# Patient Record
Sex: Female | Born: 1950
Health system: Southern US, Community
[De-identification: ages and names within clinical notes are randomized; demographics above are authoritative.]

## PROBLEM LIST (undated history)

## (undated) ENCOUNTER — Emergency Department (HOSPITAL_BASED_OUTPATIENT_CLINIC_OR_DEPARTMENT_OTHER): Discharge status: Against Medical Advice | Payer: Medicare Other

## (undated) DIAGNOSIS — N189 Chronic kidney disease, unspecified: Secondary | ICD-10-CM

## (undated) DIAGNOSIS — F32A Depression, unspecified: Secondary | ICD-10-CM

## (undated) DIAGNOSIS — F418 Other specified anxiety disorders: Secondary | ICD-10-CM

## (undated) DIAGNOSIS — F410 Panic disorder [episodic paroxysmal anxiety] without agoraphobia: Secondary | ICD-10-CM

## (undated) DIAGNOSIS — I509 Heart failure, unspecified: Secondary | ICD-10-CM

## (undated) DIAGNOSIS — F329 Major depressive disorder, single episode, unspecified: Secondary | ICD-10-CM

## (undated) DIAGNOSIS — L304 Erythema intertrigo: Secondary | ICD-10-CM

## (undated) DIAGNOSIS — I10 Essential (primary) hypertension: Secondary | ICD-10-CM

## (undated) DIAGNOSIS — E119 Type 2 diabetes mellitus without complications: Secondary | ICD-10-CM

## (undated) DIAGNOSIS — G43909 Migraine, unspecified, not intractable, without status migrainosus: Secondary | ICD-10-CM

## (undated) DIAGNOSIS — C801 Malignant (primary) neoplasm, unspecified: Secondary | ICD-10-CM

## (undated) HISTORY — DX: Depression, unspecified: F32.A

## (undated) HISTORY — DX: Major depressive disorder, single episode, unspecified: F32.9

## (undated) HISTORY — PX: DILATION AND CURETTAGE OF UTERUS: SHX78

## (undated) HISTORY — DX: Malignant (primary) neoplasm, unspecified: C80.1

## (undated) HISTORY — PX: OTHER SURGICAL HISTORY: SHX169

## (undated) HISTORY — PX: ABDOMINAL HYSTERECTOMY: SHX81

## (undated) HISTORY — PX: TONSILLECTOMY AND ADENOIDECTOMY: SUR1326

## (undated) HISTORY — DX: Erythema intertrigo: L30.4

## (undated) HISTORY — PX: TUBAL LIGATION: SHX77

## (undated) HISTORY — PX: BREAST SURGERY: SHX581

## (undated) HISTORY — PX: NASAL SEPTUM SURGERY: SHX37

## (undated) HISTORY — PX: TOTAL ABDOMINAL HYSTERECTOMY W/ BILATERAL SALPINGOOPHORECTOMY: SHX83

## (undated) HISTORY — DX: Chronic kidney disease, unspecified: N18.9

---

## 1998-11-07 ENCOUNTER — Encounter: Payer: Self-pay | Admitting: Emergency Medicine

## 1998-11-07 ENCOUNTER — Emergency Department (HOSPITAL_COMMUNITY): Admission: EM | Admit: 1998-11-07 | Discharge: 1998-11-07 | Payer: Self-pay | Admitting: Emergency Medicine

## 1998-11-12 ENCOUNTER — Emergency Department (HOSPITAL_COMMUNITY): Admission: EM | Admit: 1998-11-12 | Discharge: 1998-11-12 | Payer: Self-pay | Admitting: Internal Medicine

## 2001-04-19 ENCOUNTER — Other Ambulatory Visit: Admission: RE | Admit: 2001-04-19 | Discharge: 2001-04-19 | Payer: Self-pay | Admitting: Obstetrics and Gynecology

## 2001-05-04 ENCOUNTER — Ambulatory Visit (HOSPITAL_COMMUNITY): Admission: RE | Admit: 2001-05-04 | Discharge: 2001-05-04 | Payer: Self-pay | Admitting: Obstetrics and Gynecology

## 2001-05-04 ENCOUNTER — Encounter: Payer: Self-pay | Admitting: Obstetrics and Gynecology

## 2002-01-28 ENCOUNTER — Emergency Department (HOSPITAL_COMMUNITY): Admission: EM | Admit: 2002-01-28 | Discharge: 2002-01-28 | Payer: Self-pay | Admitting: Emergency Medicine

## 2002-01-28 ENCOUNTER — Encounter: Payer: Self-pay | Admitting: Emergency Medicine

## 2003-10-24 ENCOUNTER — Other Ambulatory Visit: Admission: RE | Admit: 2003-10-24 | Discharge: 2003-10-24 | Payer: Self-pay | Admitting: Obstetrics and Gynecology

## 2004-12-17 ENCOUNTER — Ambulatory Visit: Payer: Self-pay | Admitting: Internal Medicine

## 2005-02-27 ENCOUNTER — Ambulatory Visit: Payer: Self-pay | Admitting: Internal Medicine

## 2005-03-06 ENCOUNTER — Ambulatory Visit: Payer: Self-pay | Admitting: Family Medicine

## 2005-06-29 ENCOUNTER — Emergency Department (HOSPITAL_COMMUNITY): Admission: EM | Admit: 2005-06-29 | Discharge: 2005-06-29 | Payer: Self-pay | Admitting: Emergency Medicine

## 2005-07-22 ENCOUNTER — Encounter (INDEPENDENT_AMBULATORY_CARE_PROVIDER_SITE_OTHER): Payer: Self-pay | Admitting: *Deleted

## 2005-07-22 ENCOUNTER — Observation Stay (HOSPITAL_COMMUNITY): Admission: RE | Admit: 2005-07-22 | Discharge: 2005-07-23 | Payer: Self-pay | Admitting: Obstetrics and Gynecology

## 2005-10-22 ENCOUNTER — Ambulatory Visit: Payer: Self-pay | Admitting: Internal Medicine

## 2006-03-03 ENCOUNTER — Ambulatory Visit: Payer: Self-pay | Admitting: Internal Medicine

## 2006-04-12 ENCOUNTER — Ambulatory Visit: Payer: Self-pay | Admitting: Internal Medicine

## 2006-05-11 ENCOUNTER — Ambulatory Visit: Payer: Self-pay | Admitting: Internal Medicine

## 2006-05-14 ENCOUNTER — Encounter: Admission: RE | Admit: 2006-05-14 | Discharge: 2006-05-14 | Payer: Self-pay | Admitting: Internal Medicine

## 2006-08-17 ENCOUNTER — Ambulatory Visit: Payer: Self-pay | Admitting: Internal Medicine

## 2006-10-14 ENCOUNTER — Ambulatory Visit: Payer: Self-pay | Admitting: Internal Medicine

## 2006-12-30 ENCOUNTER — Ambulatory Visit: Payer: Self-pay | Admitting: Internal Medicine

## 2006-12-30 LAB — CONVERTED CEMR LAB
ALT: 22 units/L (ref 0–40)
AST: 23 units/L (ref 0–37)
Albumin: 3.6 g/dL (ref 3.5–5.2)
Alkaline Phosphatase: 80 units/L (ref 39–117)
BUN: 6 mg/dL (ref 6–23)
Basophils Absolute: 0 10*3/uL (ref 0.0–0.1)
Basophils Relative: 0.5 % (ref 0.0–1.0)
CO2: 28 meq/L (ref 19–32)
Calcium: 9 mg/dL (ref 8.4–10.5)
Chloride: 104 meq/L (ref 96–112)
Cholesterol: 166 mg/dL (ref 0–200)
Creatinine, Ser: 1 mg/dL (ref 0.4–1.2)
Direct LDL: 97.9 mg/dL
Eosinophils Absolute: 0.1 10*3/uL (ref 0.0–0.6)
Eosinophils Relative: 1.2 % (ref 0.0–5.0)
Free T4: 0.6 ng/dL (ref 0.6–1.6)
GFR calc Af Amer: 75 mL/min
GFR calc non Af Amer: 62 mL/min
Glucose, Bld: 138 mg/dL — ABNORMAL HIGH (ref 70–99)
HCT: 39.3 % (ref 36.0–46.0)
HDL: 36 mg/dL — ABNORMAL LOW (ref >39.0)
Hemoglobin: 13.6 g/dL (ref 12.0–15.0)
Hgb A1c MFr Bld: 5.9 % (ref 4.6–6.0)
Lymphocytes Relative: 17.8 % (ref 12.0–46.0)
MCHC: 34.4 g/dL (ref 30.0–36.0)
MCV: 89.5 fL (ref 78.0–100.0)
Monocytes Absolute: 0.5 10*3/uL (ref 0.2–0.7)
Monocytes Relative: 7.4 % (ref 3.0–11.0)
Neutro Abs: 5 10*3/uL (ref 1.4–7.7)
Neutrophils Relative %: 73.1 % (ref 43.0–77.0)
Platelets: 204 10*3/uL (ref 150–400)
Potassium: 3.5 meq/L (ref 3.5–5.1)
RBC: 4.4 M/uL (ref 3.87–5.11)
RDW: 13.2 % (ref 11.5–14.6)
Sodium: 138 meq/L (ref 135–145)
TSH: 2.82 microintl units/mL (ref 0.35–5.50)
Total Bilirubin: 0.5 mg/dL (ref 0.3–1.2)
Total CHOL/HDL Ratio: 4.6
Total Protein: 6.7 g/dL (ref 6.0–8.3)
Triglycerides: 228 mg/dL (ref 0–149)
VLDL: 46 mg/dL — ABNORMAL HIGH (ref 0–40)
WBC: 6.8 10*3/uL (ref 4.5–10.5)

## 2007-02-21 DIAGNOSIS — G43909 Migraine, unspecified, not intractable, without status migrainosus: Secondary | ICD-10-CM | POA: Insufficient documentation

## 2007-02-21 DIAGNOSIS — N200 Calculus of kidney: Secondary | ICD-10-CM | POA: Insufficient documentation

## 2007-02-21 HISTORY — DX: Calculus of kidney: N20.0

## 2007-05-31 ENCOUNTER — Ambulatory Visit: Payer: Self-pay | Admitting: Internal Medicine

## 2007-05-31 DIAGNOSIS — F329 Major depressive disorder, single episode, unspecified: Secondary | ICD-10-CM

## 2007-05-31 DIAGNOSIS — F3289 Other specified depressive episodes: Secondary | ICD-10-CM | POA: Insufficient documentation

## 2007-05-31 HISTORY — DX: Other specified depressive episodes: F32.89

## 2007-05-31 HISTORY — DX: Major depressive disorder, single episode, unspecified: F32.9

## 2007-06-15 ENCOUNTER — Ambulatory Visit: Payer: Self-pay | Admitting: *Deleted

## 2007-06-22 ENCOUNTER — Encounter: Payer: Self-pay | Admitting: Internal Medicine

## 2007-06-22 ENCOUNTER — Ambulatory Visit: Payer: Self-pay | Admitting: *Deleted

## 2007-06-29 ENCOUNTER — Ambulatory Visit: Payer: Self-pay | Admitting: *Deleted

## 2007-11-02 ENCOUNTER — Ambulatory Visit: Payer: Self-pay | Admitting: Internal Medicine

## 2007-11-02 DIAGNOSIS — Z9189 Other specified personal risk factors, not elsewhere classified: Secondary | ICD-10-CM | POA: Insufficient documentation

## 2007-11-02 DIAGNOSIS — R631 Polydipsia: Secondary | ICD-10-CM

## 2007-11-02 DIAGNOSIS — R635 Abnormal weight gain: Secondary | ICD-10-CM | POA: Insufficient documentation

## 2007-11-02 HISTORY — DX: Abnormal weight gain: R63.5

## 2007-11-02 HISTORY — DX: Polydipsia: R63.1

## 2007-11-14 ENCOUNTER — Encounter (INDEPENDENT_AMBULATORY_CARE_PROVIDER_SITE_OTHER): Payer: Self-pay | Admitting: *Deleted

## 2007-12-15 ENCOUNTER — Telehealth: Payer: Self-pay | Admitting: Internal Medicine

## 2008-01-18 ENCOUNTER — Telehealth (INDEPENDENT_AMBULATORY_CARE_PROVIDER_SITE_OTHER): Payer: Self-pay | Admitting: *Deleted

## 2008-02-03 ENCOUNTER — Ambulatory Visit: Payer: Self-pay | Admitting: Internal Medicine

## 2008-02-05 LAB — CONVERTED CEMR LAB
ALT: 21 units/L (ref 0–35)
AST: 20 units/L (ref 0–37)
Albumin: 3.5 g/dL (ref 3.5–5.2)
Alkaline Phosphatase: 74 units/L (ref 39–117)
Bilirubin, Direct: 0.1 mg/dL (ref 0.0–0.3)
Cholesterol: 170 mg/dL (ref 0–200)
Direct LDL: 95.5 mg/dL
HDL: 34.6 mg/dL — ABNORMAL LOW (ref >39.0)
Hgb A1c MFr Bld: 6 % (ref 4.6–6.0)
Total Bilirubin: 0.8 mg/dL (ref 0.3–1.2)
Total CHOL/HDL Ratio: 4.9
Total Protein: 7.1 g/dL (ref 6.0–8.3)
Triglycerides: 301 mg/dL (ref 0–149)
VLDL: 60 mg/dL — ABNORMAL HIGH (ref 0–40)

## 2008-02-07 ENCOUNTER — Encounter (INDEPENDENT_AMBULATORY_CARE_PROVIDER_SITE_OTHER): Payer: Self-pay | Admitting: *Deleted

## 2008-02-27 ENCOUNTER — Ambulatory Visit: Payer: Self-pay | Admitting: Internal Medicine

## 2008-02-27 DIAGNOSIS — E8881 Metabolic syndrome: Secondary | ICD-10-CM

## 2008-02-27 DIAGNOSIS — E785 Hyperlipidemia, unspecified: Secondary | ICD-10-CM | POA: Insufficient documentation

## 2008-02-27 HISTORY — DX: Hyperlipidemia, unspecified: E78.5

## 2008-02-27 HISTORY — DX: Metabolic syndrome: E88.81

## 2008-02-27 HISTORY — DX: Metabolic syndrome: E88.810

## 2008-02-27 LAB — CONVERTED CEMR LAB
Cholesterol, target level: 200 mg/dL
HDL goal, serum: 40 mg/dL
LDL Goal: 160 mg/dL

## 2008-03-14 ENCOUNTER — Ambulatory Visit: Payer: Self-pay | Admitting: Internal Medicine

## 2008-06-29 ENCOUNTER — Ambulatory Visit: Payer: Self-pay | Admitting: Internal Medicine

## 2008-08-09 ENCOUNTER — Ambulatory Visit: Payer: Self-pay | Admitting: Internal Medicine

## 2008-08-09 ENCOUNTER — Inpatient Hospital Stay (HOSPITAL_COMMUNITY): Admission: AD | Admit: 2008-08-09 | Discharge: 2008-08-11 | Payer: Self-pay | Admitting: Internal Medicine

## 2008-08-09 DIAGNOSIS — I10 Essential (primary) hypertension: Secondary | ICD-10-CM

## 2008-08-09 DIAGNOSIS — R109 Unspecified abdominal pain: Secondary | ICD-10-CM | POA: Insufficient documentation

## 2008-08-09 DIAGNOSIS — R6883 Chills (without fever): Secondary | ICD-10-CM | POA: Insufficient documentation

## 2008-08-09 HISTORY — DX: Essential (primary) hypertension: I10

## 2008-08-11 ENCOUNTER — Encounter: Payer: Self-pay | Admitting: Internal Medicine

## 2008-08-15 ENCOUNTER — Encounter: Admission: RE | Admit: 2008-08-15 | Discharge: 2008-08-15 | Payer: Self-pay | Admitting: Internal Medicine

## 2008-08-15 ENCOUNTER — Ambulatory Visit: Payer: Self-pay | Admitting: Internal Medicine

## 2008-08-15 ENCOUNTER — Telehealth: Payer: Self-pay | Admitting: Internal Medicine

## 2008-08-15 DIAGNOSIS — R000 Tachycardia, unspecified: Secondary | ICD-10-CM | POA: Insufficient documentation

## 2008-08-15 DIAGNOSIS — R5383 Other fatigue: Secondary | ICD-10-CM

## 2008-08-15 DIAGNOSIS — R5381 Other malaise: Secondary | ICD-10-CM | POA: Insufficient documentation

## 2008-08-15 DIAGNOSIS — G43809 Other migraine, not intractable, without status migrainosus: Secondary | ICD-10-CM

## 2008-08-15 DIAGNOSIS — R9431 Abnormal electrocardiogram [ECG] [EKG]: Secondary | ICD-10-CM

## 2008-08-15 HISTORY — DX: Other migraine, not intractable, without status migrainosus: G43.809

## 2008-08-15 HISTORY — DX: Abnormal electrocardiogram (ECG) (EKG): R94.31

## 2008-08-16 ENCOUNTER — Telehealth (INDEPENDENT_AMBULATORY_CARE_PROVIDER_SITE_OTHER): Payer: Self-pay | Admitting: *Deleted

## 2008-08-16 ENCOUNTER — Encounter: Payer: Self-pay | Admitting: Internal Medicine

## 2008-08-17 ENCOUNTER — Telehealth (INDEPENDENT_AMBULATORY_CARE_PROVIDER_SITE_OTHER): Payer: Self-pay | Admitting: *Deleted

## 2008-08-17 ENCOUNTER — Encounter (INDEPENDENT_AMBULATORY_CARE_PROVIDER_SITE_OTHER): Payer: Self-pay | Admitting: *Deleted

## 2008-08-20 ENCOUNTER — Encounter (INDEPENDENT_AMBULATORY_CARE_PROVIDER_SITE_OTHER): Payer: Self-pay | Admitting: *Deleted

## 2008-08-21 ENCOUNTER — Ambulatory Visit: Payer: Self-pay | Admitting: Cardiology

## 2008-09-04 ENCOUNTER — Ambulatory Visit: Payer: Self-pay | Admitting: Cardiology

## 2008-09-04 ENCOUNTER — Encounter: Payer: Self-pay | Admitting: Internal Medicine

## 2008-09-24 ENCOUNTER — Encounter: Payer: Self-pay | Admitting: Internal Medicine

## 2009-01-24 ENCOUNTER — Telehealth (INDEPENDENT_AMBULATORY_CARE_PROVIDER_SITE_OTHER): Payer: Self-pay | Admitting: *Deleted

## 2009-03-29 ENCOUNTER — Encounter: Payer: Self-pay | Admitting: Internal Medicine

## 2009-04-17 LAB — CONVERTED CEMR LAB: Pap Smear: NORMAL

## 2009-04-25 ENCOUNTER — Encounter: Payer: Self-pay | Admitting: Internal Medicine

## 2009-05-16 ENCOUNTER — Encounter: Payer: Self-pay | Admitting: Internal Medicine

## 2009-05-21 LAB — HM MAMMOGRAPHY: HM Mammogram: NORMAL

## 2009-05-30 ENCOUNTER — Ambulatory Visit: Payer: Self-pay | Admitting: Internal Medicine

## 2009-05-30 DIAGNOSIS — H6690 Otitis media, unspecified, unspecified ear: Secondary | ICD-10-CM | POA: Insufficient documentation

## 2009-08-16 ENCOUNTER — Encounter: Payer: Self-pay | Admitting: Internal Medicine

## 2009-08-21 ENCOUNTER — Ambulatory Visit (HOSPITAL_COMMUNITY): Admission: RE | Admit: 2009-08-21 | Discharge: 2009-08-21 | Payer: Self-pay | Admitting: Obstetrics and Gynecology

## 2009-11-08 ENCOUNTER — Encounter: Payer: Self-pay | Admitting: Internal Medicine

## 2009-11-08 ENCOUNTER — Ambulatory Visit: Payer: Self-pay | Admitting: Family

## 2009-11-08 DIAGNOSIS — L538 Other specified erythematous conditions: Secondary | ICD-10-CM

## 2009-11-08 HISTORY — DX: Other specified erythematous conditions: L53.8

## 2009-11-08 LAB — CONVERTED CEMR LAB
ALT: 16 units/L (ref 0–35)
AST: 17 units/L (ref 0–37)
BUN: 7 mg/dL (ref 6–23)
Basophils Absolute: 0.1 10*3/uL (ref 0.0–0.1)
Basophils Relative: 1.3 % (ref 0.0–3.0)
CO2: 30 meq/L (ref 19–32)
Calcium: 8.6 mg/dL (ref 8.4–10.5)
Chloride: 103 meq/L (ref 96–112)
Creatinine, Ser: 0.9 mg/dL (ref 0.4–1.2)
Eosinophils Absolute: 0.2 10*3/uL (ref 0.0–0.7)
Eosinophils Relative: 1.9 % (ref 0.0–5.0)
GFR calc non Af Amer: 68.81 mL/min (ref >60)
Glucose, Bld: 99 mg/dL (ref 70–99)
HCT: 39.2 % (ref 36.0–46.0)
Hemoglobin: 12.9 g/dL (ref 12.0–15.0)
Lymphocytes Relative: 18.8 % (ref 12.0–46.0)
Lymphs Abs: 1.5 10*3/uL (ref 0.7–4.0)
MCHC: 32.8 g/dL (ref 30.0–36.0)
MCV: 92.5 fL (ref 78.0–100.0)
Monocytes Absolute: 0.5 10*3/uL (ref 0.1–1.0)
Monocytes Relative: 6.8 % (ref 3.0–12.0)
Neutro Abs: 5.6 10*3/uL (ref 1.4–7.7)
Neutrophils Relative %: 71.2 % (ref 43.0–77.0)
Platelets: 194 10*3/uL (ref 150.0–400.0)
Potassium: 4.1 meq/L (ref 3.5–5.1)
RBC: 4.24 M/uL (ref 3.87–5.11)
RDW: 13.1 % (ref 11.5–14.6)
Sodium: 139 meq/L (ref 135–145)
TSH: 2.71 microintl units/mL (ref 0.35–5.50)
WBC: 7.9 10*3/uL (ref 4.5–10.5)

## 2009-11-11 ENCOUNTER — Encounter (INDEPENDENT_AMBULATORY_CARE_PROVIDER_SITE_OTHER): Payer: Self-pay | Admitting: *Deleted

## 2009-11-11 ENCOUNTER — Telehealth (INDEPENDENT_AMBULATORY_CARE_PROVIDER_SITE_OTHER): Payer: Self-pay | Admitting: *Deleted

## 2009-11-22 ENCOUNTER — Encounter (INDEPENDENT_AMBULATORY_CARE_PROVIDER_SITE_OTHER): Payer: Self-pay | Admitting: *Deleted

## 2009-11-25 ENCOUNTER — Ambulatory Visit: Payer: Self-pay | Admitting: Gastroenterology

## 2009-12-12 ENCOUNTER — Ambulatory Visit: Payer: Self-pay | Admitting: Gastroenterology

## 2009-12-12 LAB — HM COLONOSCOPY: HM Colonoscopy: NORMAL

## 2009-12-13 ENCOUNTER — Encounter: Payer: Self-pay | Admitting: Gastroenterology

## 2010-04-10 ENCOUNTER — Telehealth: Payer: Self-pay | Admitting: Internal Medicine

## 2010-05-29 ENCOUNTER — Encounter: Payer: Self-pay | Admitting: Internal Medicine

## 2010-06-05 ENCOUNTER — Encounter: Admission: RE | Admit: 2010-06-05 | Discharge: 2010-06-05 | Payer: Self-pay | Admitting: Obstetrics and Gynecology

## 2010-07-01 ENCOUNTER — Encounter: Payer: Self-pay | Admitting: Internal Medicine

## 2010-07-01 ENCOUNTER — Telehealth (INDEPENDENT_AMBULATORY_CARE_PROVIDER_SITE_OTHER): Payer: Self-pay | Admitting: *Deleted

## 2010-07-08 ENCOUNTER — Encounter: Payer: Self-pay | Admitting: Internal Medicine

## 2010-07-14 ENCOUNTER — Encounter: Payer: Self-pay | Admitting: Internal Medicine

## 2010-07-16 ENCOUNTER — Ambulatory Visit (HOSPITAL_BASED_OUTPATIENT_CLINIC_OR_DEPARTMENT_OTHER): Admission: RE | Admit: 2010-07-16 | Discharge: 2010-07-16 | Payer: Self-pay | Admitting: Surgery

## 2010-07-19 ENCOUNTER — Inpatient Hospital Stay (HOSPITAL_COMMUNITY): Admission: EM | Admit: 2010-07-19 | Discharge: 2010-07-21 | Payer: Self-pay | Admitting: Neurology

## 2010-07-21 ENCOUNTER — Encounter (INDEPENDENT_AMBULATORY_CARE_PROVIDER_SITE_OTHER): Payer: Self-pay | Admitting: Cardiology

## 2010-07-31 ENCOUNTER — Encounter: Payer: Self-pay | Admitting: Internal Medicine

## 2010-12-02 ENCOUNTER — Telehealth (INDEPENDENT_AMBULATORY_CARE_PROVIDER_SITE_OTHER): Payer: Self-pay | Admitting: *Deleted

## 2010-12-22 ENCOUNTER — Other Ambulatory Visit: Payer: Self-pay | Admitting: Family Medicine

## 2010-12-22 ENCOUNTER — Ambulatory Visit
Admission: RE | Admit: 2010-12-22 | Discharge: 2010-12-22 | Payer: Self-pay | Source: Home / Self Care | Attending: Family Medicine | Admitting: Family Medicine

## 2010-12-22 ENCOUNTER — Encounter: Payer: Self-pay | Admitting: Family Medicine

## 2010-12-22 LAB — BASIC METABOLIC PANEL
BUN: 10 mg/dL (ref 6–23)
CO2: 29 mEq/L (ref 19–32)
Calcium: 8.8 mg/dL (ref 8.4–10.5)
Chloride: 97 mEq/L (ref 96–112)
Creatinine, Ser: 0.8 mg/dL (ref 0.4–1.2)
GFR: 75.25 mL/min (ref >60.00)
Glucose, Bld: 96 mg/dL (ref 70–99)
Potassium: 4.1 mEq/L (ref 3.5–5.1)
Sodium: 135 mEq/L (ref 135–145)

## 2010-12-23 ENCOUNTER — Encounter: Payer: Self-pay | Admitting: Internal Medicine

## 2010-12-27 ENCOUNTER — Encounter: Payer: Self-pay | Admitting: Orthopedic Surgery

## 2010-12-28 ENCOUNTER — Encounter: Payer: Self-pay | Admitting: Obstetrics and Gynecology

## 2010-12-29 ENCOUNTER — Telehealth (INDEPENDENT_AMBULATORY_CARE_PROVIDER_SITE_OTHER): Payer: Self-pay | Admitting: *Deleted

## 2010-12-29 ENCOUNTER — Ambulatory Visit
Admission: RE | Admit: 2010-12-29 | Discharge: 2010-12-29 | Payer: Self-pay | Source: Home / Self Care | Attending: Internal Medicine | Admitting: Internal Medicine

## 2011-01-02 ENCOUNTER — Telehealth (INDEPENDENT_AMBULATORY_CARE_PROVIDER_SITE_OTHER): Payer: Self-pay | Admitting: *Deleted

## 2011-01-04 LAB — CONVERTED CEMR LAB
ALT: 22 units/L (ref 0–35)
AST: 21 units/L (ref 0–37)
Albumin: 4.5 g/dL (ref 3.5–5.2)
Alkaline Phosphatase: 89 units/L (ref 39–117)
BUN: 11 mg/dL (ref 6–23)
Basophils Absolute: 0 10*3/uL (ref 0.0–0.1)
Basophils Absolute: 0.1 10*3/uL (ref 0.0–0.1)
Basophils Relative: 1 % (ref 0.0–3.0)
Basophils Relative: 1 % (ref 0–1)
Bilirubin, Direct: 0.1 mg/dL (ref 0.0–0.3)
CK-MB: 1.3 ng/mL (ref 0.3–4.0)
CO2: 22 meq/L (ref 19–32)
Calcium: 8.8 mg/dL (ref 8.4–10.5)
Chloride: 102 meq/L (ref 96–112)
Cholesterol: 150 mg/dL (ref 0–200)
Creatinine, Ser: 0.89 mg/dL (ref 0.40–1.20)
Eosinophils Absolute: 0.1 10*3/uL (ref 0.0–0.7)
Eosinophils Absolute: 0.1 10*3/uL — ABNORMAL LOW (ref 0.2–0.7)
Eosinophils Relative: 1 % (ref 0–5)
Eosinophils Relative: 2 % (ref 0.0–5.0)
Free T4: 0.9 ng/dL (ref 0.6–1.6)
Glucose, Bld: 107 mg/dL — ABNORMAL HIGH (ref 70–99)
HCT: 41.3 % (ref 36.0–46.0)
HCT: 43 % (ref 36.0–46.0)
HDL: 42 mg/dL (ref >39)
Hemoglobin: 13.7 g/dL (ref 12.0–15.0)
Hemoglobin: 14 g/dL (ref 12.0–15.0)
Hgb A1c MFr Bld: 6 % (ref 4.6–6.1)
Indirect Bilirubin: 0.3 mg/dL (ref 0.0–0.9)
LDL Cholesterol: 64 mg/dL (ref 0–99)
Lymphocytes Relative: 19 % (ref 12–46)
Lymphocytes Relative: 24.6 % (ref 12.0–46.0)
Lymphs Abs: 1.6 10*3/uL (ref 0.7–4.0)
MCHC: 31.9 g/dL (ref 30.0–36.0)
MCHC: 33.9 g/dL (ref 30.0–36.0)
MCV: 91.9 fL (ref 78.0–100.0)
MCV: 95.8 fL (ref 78.0–100.0)
Magnesium: 2 mg/dL (ref 1.5–2.5)
Monocytes Absolute: 0.2 10*3/uL (ref 0.1–1.0)
Monocytes Absolute: 0.5 10*3/uL (ref 0.1–1.0)
Monocytes Relative: 3.7 % (ref 3.0–12.0)
Monocytes Relative: 6 % (ref 3–12)
Neutro Abs: 4.2 10*3/uL (ref 1.4–7.7)
Neutro Abs: 6.3 10*3/uL (ref 1.7–7.7)
Neutrophils Relative %: 68.7 % (ref 43.0–77.0)
Neutrophils Relative %: 74 % (ref 43–77)
Platelets: 222 10*3/uL (ref 150–400)
Platelets: 301 10*3/uL (ref 150–400)
Potassium: 3.9 meq/L (ref 3.5–5.3)
Potassium: 4.3 meq/L (ref 3.5–5.1)
RBC: 4.49 M/uL (ref 3.87–5.11)
RBC: 4.5 M/uL (ref 3.87–5.11)
RDW: 12.6 % (ref 11.5–14.6)
RDW: 13.8 % (ref 11.5–15.5)
Sodium: 140 meq/L (ref 135–145)
TSH: 2.73 microintl units/mL (ref 0.35–5.50)
TSH: 3.153 microintl units/mL (ref 0.350–5.50)
Total Bilirubin: 0.4 mg/dL (ref 0.3–1.2)
Total CHOL/HDL Ratio: 3.6
Total Protein: 7.3 g/dL (ref 6.0–8.3)
Triglycerides: 219 mg/dL — ABNORMAL HIGH (ref <150)
Troponin I: 0.05 ng/mL (ref <0.06)
VLDL: 44 mg/dL — ABNORMAL HIGH (ref 0–40)
WBC: 6.1 10*3/uL (ref 4.5–10.5)
WBC: 8.5 10*3/uL (ref 4.0–10.5)

## 2011-01-08 NOTE — Miscellaneous (Signed)
Summary: Orders Update  Clinical Lists Changes 

## 2011-01-08 NOTE — Progress Notes (Signed)
Summary: need lab orders and codes for 2/15--added in Epic  Phone Note Call from Patient   Caller: Patient Summary of Call: patient has labs before CPX per Hopp on 2/15---what are orders and codes please?? Initial call taken by: Jerolyn Shin,  December 29, 2010 11:23 AM  Follow-up for Phone Call        v70.0/Lipid/Hep/BMP/CBCD/TSH/Stool Cards Follow-up by: Shonna Chock CMA,  December 29, 2010 3:23 PM  Additional Follow-up for Phone Call Additional follow up Details #1::        Added to lab visit on 01/21/2011 in Epic  Additional Follow-up by: Jerolyn Shin,  December 30, 2010 11:33 AM

## 2011-01-08 NOTE — Letter (Signed)
   [facility] [address] [place], [place] [address] [phone]    November 08, 2009   Ebony Garcia [address] [place], [place] [address]  RE:  LAB RESULTS  Dear  [name],  The following is an interpretation of your most recent lab tests.  Please take note of any instructions provided or changes to medications that have resulted from your lab work.  ELECTROLYTES:  Good - no changes needed  KIDNEY FUNCTION TESTS:  Good - no changes needed   THYROID STUDIES:  Thyroid studies normal TSH: 2.71     DIABETIC STUDIES:  Excellent - no changes needed Blood Glucose: 99   HgbA1C: 6.0     CBC:  Good - no changes needed   Sincerely Yours,    Lemont Fillers FNP

## 2011-01-08 NOTE — Progress Notes (Signed)
Summary: TIMCO "PROOF OF PHYSICAL" PAPERWORK  Phone Note Call from Patient Call back at Home Phone [phone]   Caller: Patient Summary of Call: PATIENT WAS SEEN 11/08/2009 BY MELISSA O'SULLIVAN AND FORGOT HER [facility] "PROOF OF PHYSICAL " FORM  SHE DROPPED IT OFF TODAY AND REQUESTED THAT, AFTER IT HAS BEEN SIGNED AND DATED, IF IT COULD BE MAILED TO HER  CONFIRMED THAT ADDRESS IN EMR IS CORRECT  WILL TAKE FORM TO CHEMIRA Initial call taken by: Jerolyn Shin,  November 11, 2009 3:20 PM  Follow-up for Phone Call        done and mailed to pt ..........Marland KitchenDoristine Devoid  November 11, 2009 3:50 PM

## 2011-01-08 NOTE — Assessment & Plan Note (Signed)
Summary: cpx//tl   Vital Signs:  Patient Profile:   60 Years Old Female Height:     64 inches Weight:      208 pounds Pulse rate:   60 / minute Pulse rhythm:   regular Resp:     18 per minute BP sitting:   108 / 62  (left arm) Cuff size:   large  Pt. in pain?   no  Vitals Entered By: Wendall Stade (November 02, 2007 10:22 AM)                  Chief Complaint:  cpx.  History of Present Illness: She plans diet & CVE changes post holidays because of weight.  Current Allergies (reviewed today): ! DARVON ! DARVOCET ! * ANTIHISTAMINES VICODIN   Social History:    No diet   Risk Factors:  Tobacco use:  never Alcohol use:  no Exercise:  no   Review of Systems  General      Denies chills, fatigue, fever, loss of appetite, malaise, sleep disorder, sweats, weakness, and weight loss.      Occa sweats; S/P TAH & BSO, on Vivelle from Dr Rosalio Macadamia  Eyes      Denies blurring, discharge, double vision, eye irritation, eye pain, halos, itching, light sensitivity, red eye, vision loss-1 eye, and vision loss-both eyes.      Last exam ?, NAD  ENT      Denies decreased hearing, difficulty swallowing, ear discharge, earache, hoarseness, nasal congestion, nosebleeds, postnasal drainage, ringing in ears, sinus pressure, and sore throat.  CV      Denies bluish discoloration of lips or nails, chest pain or discomfort, difficulty breathing at night, difficulty breathing while lying down, shortness of breath with exertion, and swelling of feet.      Occa racing  Resp      Complains of excessive snoring and hypersomnolence.      Denies cough, morning headaches, shortness of breath, and sputum productive.      No apnea reported  GI      Denies abdominal pain, bloody stools, change in bowel habits, constipation, dark tarry stools, diarrhea, indigestion, nausea, and vomiting.  GU      Complains of decreased libido.      Denies discharge, dysuria, hematuria, nocturia, and  urinary frequency.      Sex drive null & void  MS      Denies joint pain, joint redness, joint swelling, loss of strength, low back pain, mid back pain, muscle aches, muscle , cramps, muscle weakness, stiffness, and thoracic pain.  Derm      Denies changes in color of skin, changes in nail beds, dryness, excessive perspiration, flushing, hair loss, itching, lesion(s), poor wound healing, and rash.  Neuro      Complains of headaches.      Denies difficulty with concentration, disturbances in coordination, memory loss, numbness, sensation of room spinning, and tingling.      Headaches 75% better with Symbyax as to freq & severity; "I got my life back"  Psych      Complains of depression.      Denies anxiety, easily angered, easily tearful, irritability, suicidal thoughts/plans, thoughts of violence, and thoughts /plans of harming others.  Endo      Complains of excessive thirst.      Denies cold intolerance, excessive hunger, excessive urination, heat intolerance, polyuria, and weight change.  Heme      Denies abnormal bruising and bleeding.  Allergy  Denies hives or rash, itching eyes, persistent infections, seasonal allergies, and sneezing.   Physical Exam  General:     Well-developed,well-nourished,in no acute distress; alert,appropriate and cooperative throughout examination;overweight-appearing.   Head:     Normocephalic and atraumatic without obvious abnormalities. No apparent alopecia or balding. Eyes:     No corneal or conjunctival inflammation noted. Perrla. Funduscopic exam benign, without hemorrhages, exudates or papilledema. Ears:     External ear exam shows no significant lesions or deformities.  Otoscopic examination reveals clear canals, tympanic membranes are intact bilaterally without bulging, retraction, inflammation or discharge. Hearing is grossly normal bilaterally. Nose:     External nasal examination shows no deformity or inflammation. Nasal mucosa are  pink and moist without lesions or exudates. Mouth:     Oral mucosa and oropharynx without lesions or exudates.  Teeth in good repair. Neck:     No deformities, masses, or tenderness noted. Lungs:     Normal respiratory effort, chest expands symmetrically. Lungs are clear to auscultation, no crackles or wheezes. Heart:     Normal rate and regular rhythm. S1 and S2 normal without gallop, murmur, click, rub. S4 with slurring Abdomen:     Bowel sounds positive,abdomen soft and non-tender without masses, organomegaly or hernias noted. Protuberant Rectal:     as per GYN  Genitalia:     as per Gyn Msk:     No deformity or scoliosis noted of thoracic or lumbar spine.   Pulses:     R and L carotid,radial,dorsalis pedis and posterior tibial pulses are full and equal bilaterally Extremities:     No clubbing, cyanosis, edema, or deformity noted with normal full range of motion of all joints.   Minimal crepitus knees Neurologic:     strength normal in all extremities, gait normal, and DTRs symmetrical and normal.   Skin:     Intact without suspicious lesions or rashes Cervical Nodes:     No lymphadenopathy noted Axillary Nodes:     No palpable lymphadenopathy Psych:     Oriented X3, memory intact for recent and remote, normally interactive, good eye contact, not anxious appearing, not depressed appearing, and not agitated.      Impression & Recommendations:  Problem # 1:  ROUTINE GENERAL MEDICAL EXAM@HEALTH  CARE FACL (ICD-V70.0)  Orders: EKG w/ Interpretation (93000) TLB-Lipid Panel (80061-LIPID) TLB-BMP (Basic Metabolic Panel-BMET) (80048-METABOL) TLB-CBC Platelet - w/Differential (85025-CBCD) TLB-Hepatic/Liver Function Pnl (80076-HEPATIC) TLB-TSH (Thyroid Stimulating Hormone) (84443-TSH) TLB-A1C / Hgb A1C (Glycohemoglobin) (83036-A1C)   Problem # 2:  MIGRAINE (ICD-346.90)  Her updated medication list for this problem includes:    Atenolol 100 Mg Tabs (Atenolol) .Marland Kitchen... Take once  daily    Zomig 5 Mg Tabs (Zolmitriptan) .Marland Kitchen... Take as needed   Problem # 3:  SNORING, HX OF (ICD-V15.89)  Problem # 4:  POLYDIPSIA (ICD-783.5)  Orders: TLB-A1C / Hgb A1C (Glycohemoglobin) (83036-A1C)   Problem # 5:  WEIGHT GAIN (ICD-783.1)  Orders: TLB-A1C / Hgb A1C (Glycohemoglobin) (83036-A1C)   Problem # 6:  DISORDER, DEPRESSIVE NEC (ICD-311) ? "bipolarity"  Complete Medication List: 1)  Atenolol 100 Mg Tabs (Atenolol) .... Take once daily 2)  Hydrochlorothiazide 12.5 Mg Caps (Hydrochlorothiazide) .... Take one tablet daily 3)  Zomig 5 Mg Tabs (Zolmitriptan) .... Take as needed 4)  Symbyax 6-50 Mg Caps (Olanzapine-fluoxetine hcl) .Marland Kitchen.. 1 once daily 5)  Vivelle-dot 0.075 Mg/24hr Pttw (Estradiol) .... Change patch 2 times q week   Patient Instructions: 1)  Please complete stool cards from Dr Rosalio Macadamia. Please  consider screening colonoscopy & Neurochemist consultation. Review article on Diabesity. Call if cardiopulmonary  symptoms with walking. Ask husband to monitor for apnea.    ]

## 2011-01-08 NOTE — Assessment & Plan Note (Signed)
Summary: high BP (170/114)//lch   Vital Signs:  Patient profile:   60 year old female Menstrual status:  hysterectomy Weight:      206 pounds BMI:     36.62 Pulse rate:   76 / minute BP sitting:   140 / 100  (left arm)  Vitals Entered By: Doristine Devoid CMA (December 22, 2010 11:17 AM) CC: elevated bp    History of Present Illness: 60 yo woman here today for elevated BP.  has hx of HTN on problem list but has not been on meds since 2009 b/c she was normotensive.  pt reports she had HA Friday and went to bed.  reports she has had increased fatigue, intermittant HAs, body aches.  reports sxs started 10/3 after getting botox injxn for chronic migraines at [facility].  decided to check BP last night- it was 165/114 on mother-in-law's cuff.  reports intermittant chest pressure since Oct.  denies SOB.  some edema.  has had some increased stress due to fact that mother-in-law has moved in w/ them.  Current Medications (verified): 1)  Vivelle-Dot 0.075 Mg/24hr  Pttw (Estradiol) .... Change Patch 2 Times Q Week 2)  Zomig 5 Mg Tabs (Zolmitriptan) .... Take As Needed For Migraine 3)  Propranolol Hcl Cr 60 Mg Xr24h-Cap (Propranolol Hcl) .Marland Kitchen.. 1 Q 12 Hours For Migraine Prevention & Rapid Heart Rate **appointment Due 11/2010** 4)  Indocin 1 Mg Solr (Indomethacin Sodium) .... As Needed For Migraines 5)  Xanax Xr 0.5 Mg Xr24h-Tab (Alprazolam) .Marland Kitchen.. 1 Tab By Mouth Prn 6)  Nystatin 100000 Unit/gm Crea (Nystatin) .... Apply Twice Daily To Affected Area 7)  Hydrochlorothiazide 12.5 Mg  Tabs (Hydrochlorothiazide) .... Take 1 Tab  By Mouth Every Morning 8)  Pristiq 100 Mg Xr24h-Tab (Desvenlafaxine Succinate) .... Take One Tablet Two Times A Day  Allergies (verified): 1)  ! Darvon 2)  ! Darvocet 3)  ! * Antihistamines 4)  ! Augmentin 5)  Vicodin  Past History:  Past medical, surgical, family and social histories (including risk factors) reviewed, and no changes noted (except as noted below).  Past Medical  History: Reviewed history from 08/09/2008 and no changes required. RENAL CALCULI (4/06) WEIGHT GAIN (ICD-783.1) SNORING, HX OF (ICD-V15.89) DISORDER, DEPRESSIVE NEC (ICD-311) MIGRAINE (ICD-346.90) RENAL CALCULUS (ICD-592.0) Hypertension Hyperlipidemia(Triglycerides 301)  Past Surgical History: Reviewed history from 08/09/2008 and no changes required. Hysterectomy & BSO for fibroids & migraines (not MD's opinion as to migraines) TONSILS/ADENOIDS (T/A) DEVIATED SEPTUM TUBAL LIGATION Giii    P iii D&C X1  Family History: Reviewed history from 11/08/2009 and no changes required. Father: bladder CA, shunt for hydrocephalus, prostate cancer Mother: CROHN'S, DIVERTICULOSIS, CHF AF Siblings: bro & sister ? bipolar  PGF-CA ?TYPE MG AUNT-CA ?TYPE MGM-MI UNCLE-LITHIUM for Bipolar Disorder 2ND COUSIN HODGKINS DISEASE AUNT- NERVOUS BREAKDOWN MATERNAL SIDE HAS MIGRAINES AT LEAST 10 PEOPLE  Social History: Reviewed history from 11/08/2009 and no changes required. 3 sons on disability due to chronic migraines. denies ETOH, non-smoker  Review of Systems      See HPI  Physical Exam  General:  Well-developed, obese female,in no acute distress; alert,appropriate and cooperative throughout examination Neck:  No deformities, masses, or tenderness noted. Lungs:  Normal respiratory effort, chest expands symmetrically. Lungs are clear to auscultation, no crackles or wheezes. Heart:  Normal rate and regular rhythm. S1 and S2 normal without gallop, murmur, click, rub or other extra sounds. Pulses:  +2 carotid, radial, DP Extremities:  no C/C/E   Impression & Recommendations:  Problem #  1:  HYPERTENSION (ICD-401.9) Assessment Deteriorated pt's BP mildly elevated here in office, apparently higher at home.  has been on HCTZ previously w/out difficulty.  restart.  EKG shows some nonspecific Twave changes in V2 and aVL.  discussed sending pt for stress test- she declined.  'that was the  worst experience of my life.  and i've had 3 babies and chronic migraines.  i thought i was going to die!'.  check BMP as baseline given addition of HCTZ and have pt f/u w/ PCP shortly.  reviewed red flags that should prompt immediate return.  Pt expresses understanding and is in agreement w/ this plan. Her updated medication list for this problem includes:    Propranolol Hcl Cr 60 Mg Xr24h-cap (Propranolol hcl) .Marland Kitchen... 1 q 12 hours for migraine prevention & rapid heart rate **appointment due 11/2010**    Hydrochlorothiazide 12.5 Mg Tabs (Hydrochlorothiazide) .Marland Kitchen... Take 1 tab  by mouth every morning  Orders: Venipuncture (91478) TLB-BMP (Basic Metabolic Panel-BMET) (80048-METABOL) EKG w/ Interpretation (93000)  Complete Medication List: 1)  Vivelle-dot 0.075 Mg/24hr Pttw (Estradiol) .... Change patch 2 times q week 2)  Zomig 5 Mg Tabs (Zolmitriptan) .... Take as needed for migraine 3)  Propranolol Hcl Cr 60 Mg Xr24h-cap (Propranolol hcl) .Marland Kitchen.. 1 q 12 hours for migraine prevention & rapid heart rate **appointment due 11/2010** 4)  Indocin 1 Mg Solr (Indomethacin sodium) .... As needed for migraines 5)  Xanax Xr 0.5 Mg Xr24h-tab (Alprazolam) .Marland Kitchen.. 1 tab by mouth prn 6)  Nystatin 100000 Unit/gm Crea (Nystatin) .... Apply twice daily to affected area 7)  Hydrochlorothiazide 12.5 Mg Tabs (Hydrochlorothiazide) .... Take 1 tab  by mouth every morning 8)  Pristiq 100 Mg Xr24h-tab (Desvenlafaxine succinate) .... Take one tablet two times a day  Other Orders: Specimen Handling (29562)  Patient Instructions: 1)  Please schedule an appt with Dr Alwyn Ren in 1 week to recheck blood pressure 2)  Start the Hydrochlorothiazide (HCTZ) daily for your blood pressure 3)  If you develop worsening chest pain, shortness of breath, or other concerns- please call or go to the ER 4)  We'll call you with your cardiology appt 5)  Call with any questions or concerns 6)  Hang in there!!! Prescriptions: HYDROCHLOROTHIAZIDE  12.5 MG  TABS (HYDROCHLOROTHIAZIDE) Take 1 tab  by mouth every morning  #30 x 3   Entered and Authorized by:   Neena Rhymes MD   Signed by:   Neena Rhymes MD on 12/22/2010   Method used:   [facility]ically to        [facility] [phone]* (retail)       [address]       [place], [place]  [address]       Ph: [phone]       Fax: [phone]   RxID:   [id number]    Orders Added: 1)  Venipuncture [38756] 2)  TLB-BMP (Basic Metabolic Panel-BMET) [80048-METABOL] 3)  EKG w/ Interpretation [93000] 4)  Specimen Handling [99000] 5)  Est. Patient Level III [43329]

## 2011-01-08 NOTE — Assessment & Plan Note (Signed)
Summary: ACUTE-- DEPRESSION / BAD HEADACHES   Vital Signs:  Patient Profile:   60 Years Old Female Height:     64 inches Weight:      210.6 pounds Temp:     97.9 degrees F 0 Pulse rate:   64 / minute Resp:     17 per minute BP sitting:   118 / 72  (left arm) Cuff size:   large  Vitals Entered By: Shonna Chock (June 29, 2008 2:05 PM)                 Chief Complaint:  1.) DEPRESSION WORSE X FEW MONTHS 2.)HEADACHES-ONGOING CONCERN and Headaches.  History of Present Illness:  Headaches      This is a 60 year old woman who presents with Headaches.  Constant ache diffusely w/o pattern & prodrome or aura, but most above OS & in L occiput. ? MSG , nitrates are triggers. Rx: Zomig helps.  The patient reports photophobia and phonophobia, but denies nausea, vomiting, sweats, tearing of eyes, nasal congestion, sinus pain, and sinus pressure.  The headache is described as constant and pressure-like.  High-risk features (red flags) include neck pain/stiffness and age >50 years.  The patient denies the following high-risk features: fever, vision loss or change, focal weakness, altered mental status, rash, trauma, pain worse with exertion, new type of headache, immunosuppression, concomitant infection, and anticoagulation use.  She had more headaches on Symbyax 6/25 but now June 17, 2049 makes me "a walking dead person"  Depression History:      The patient is having a depressed mood most of the day and has a diminished interest in her usual daily activities.  Positive alarm features for depression include hypersomnia, psychomotor retardation, fatigue (loss of energy), and recurrent thoughts of death or suicide.  However, she denies significant weight loss, significant weight gain, insomnia, psychomotor agitation, feelings of worthlessness (guilt), and impaired concentration (indecisiveness).  Positive alarm features for a manic disorder include distractibility.  She denies persistently & abnormally elevated  mood, abnormally & persistently irritable mood, less need for sleep, talkative or feels need to keep talking, flight of ideas, increase in goal-directed activity, psychomotor agitation, inflated self-esteem or grandiosity, excessive buying sprees, excessive sexual indiscretions, and excessive foolish business investments.        Risk factors for depression include a family history of depression, a family history of alcoholism, and a personal history of depression.  Suicide risk questions reveal that she does not feel like life is worth living, she wishes that she were dead, she has thought about ending her life, and she has even planned how to end her life.           Current Allergies: ! DARVON ! DARVOCET ! * ANTIHISTAMINES ! AUGMENTIN VICODIN   Family History:    Father: bladder CA, shunt for hydrocephalus    Mother: CROHN'S, DIVERTICULOSIS    Siblings: bro & sister ? bipolar     PGF-CA ?TYPE    MG AUNT-CA ?TYPE    MGM-MI    UNCLE-LITHIUM for Bipolar Disorder    2ND COUSIN HODGKINS DISEASE    AUNT- NERVOUS BREAKDOWN    MATERNAL SIDE HAS MIGRAINES AT LEAST 10 PEOPLE    Review of Systems  General      Complains of sleep disorder.      Denies chills.      Hot flashes  Eyes      Denies blurring, double vision, and vision loss-both eyes.  ENT  Denies decreased hearing, earache, and ringing in ears.  Derm      Denies changes in color of skin and rash.  Neuro      Denies disturbances in coordination, numbness, poor balance, tingling, and tremors.   Physical Exam  General:     well-nourished,in no acute distress; alert,appropriate and cooperative throughout examination; subdued Eyes:     No corneal or conjunctival inflammation noted. EOMI. Perrla. Funduscopic exam benign, without hemorrhages, exudates or papilledema. Vision grossly normal. Psych:     Oriented X3, memory intact for recent and remote, normally interactive, flat affect, and subdued.  MDQ: 0  items +,  "minor problem"    Impression & Recommendations:  Problem # 1:  DEPRESSION, SEVERE (ICD-311)  Orders: Psychiatric Referral (Psych)   Problem # 2:  MIGRAINE (ICD-346.90)  Her updated medication list for this problem includes:    Atenolol 100 Mg Tabs (Atenolol) .Marland Kitchen... Take once daily    Zomig 5 Mg Tabs (Zolmitriptan) .Marland Kitchen... Take as needed   Complete Medication List: 1)  Atenolol 100 Mg Tabs (Atenolol) .... Take once daily 2)  Hydrochlorothiazide 12.5 Mg Caps (Hydrochlorothiazide) .... Take one tablet daily 3)  Zomig 5 Mg Tabs (Zolmitriptan) .... Take as needed 4)  Symbyax 6-25 Mg Caps (olanzapine-fluoxetine Hcl)  .Marland Kitchen.. 1 once daily 5)  Vivelle-dot 0.075 Mg/24hr Pttw (Estradiol) .... Change patch 2 times q week 6)  Hycodan 5-1.5 Mg/22ml Syrp (Hydrocodone-homatropine) .Marland Kitchen.. 1 tsp q 6 hrs prn 7)  Azithromycin 250 Mg Tabs (Azithromycin) .... As per  pack 8)  Abilify 5 Mg Tabs (Aripiprazole) .... 1/2 qd   Patient Instructions: 1)  Please keep Neurochemist consultation   Prescriptions: ABILIFY 5 MG  TABS (ARIPIPRAZOLE) 1/2 qd  #30 x 0   Entered and Authorized by:   Marga Melnick MD   Signed by:   Marga Melnick MD on 06/29/2008   Method used:   Print then Give to Patient   RxID:   [id number] SYMBYAX 6-25 MG  CAPS (OLANZAPINE-FLUOXETINE HCL) 1 once daily  #30 x 0   Entered and Authorized by:   Marga Melnick MD   Signed by:   Marga Melnick MD on 06/29/2008   Method used:   Print then Give to Patient   RxID:   [id number]  ]  Appended Document: ACUTE-- DEPRESSION / BAD HEADACHES Ophth,ENT, neuro, Cardio exams WNL except minor ptosis OS & fine tremor with finger to nose testing

## 2011-01-08 NOTE — Progress Notes (Signed)
Summary: Refill Request  Phone Note Refill Request Call back at [phone] Message from:  Pharmacy on December 02, 2010 8:39 AM  Refills Requested: Medication #1:  PROPRANOLOL HCL CR 60 MG XR24H-CAP 1 q 12 hours for migraine prevention & rapid heart rate **APPOINTMENT DUE 11/2010**   Dosage confirmed as above?Dosage Confirmed   Supply Requested: 1 month   [facility] [address] [place]  Next Appointment Scheduled: none Initial call taken by: Harold Barban,  December 02, 2010 8:39 AM    Prescriptions: PROPRANOLOL HCL CR 60 MG XR24H-CAP (PROPRANOLOL HCL) 1 q 12 hours for migraine prevention & rapid heart rate **APPOINTMENT DUE 11/2010**  #60 x 0   Entered by:   Shonna Chock CMA   Authorized by:   Marga Melnick MD   Signed by:   Shonna Chock CMA on 12/02/2010   Method used:   [facility] to        [facility]  [phone]* (retail)       [address] [place], [place]  [address]       Ph: [phone] or [phone]       Fax: [phone]   RxID:   [id number]

## 2011-01-08 NOTE — Progress Notes (Signed)
----   Converted from flag ---- ---- 01/18/2008 1:31 PM, Okey Regal [place] wrote: [id number]   lab appt scheduled [id number] per append from dr hopper [phone] ------------------------------

## 2011-01-08 NOTE — Letter (Signed)
Summary: Previsit letter  [facility]  [address] [place], [place] [address]   Phone: [phone]  Fax: [phone]       11/11/2009 MRN: [id number]  Ebony Garcia [address] [place], [place]  [address]  Dear [name],  Welcome to the Gastroenterology Division at [facility].    You are scheduled to see a nurse for your pre-procedure visit on 11/25/2009 at 11:00AM on the 3rd floor at [facility], [address].  We ask that you try to arrive at our office 15 minutes prior to your appointment time to allow for check-in.  Your nurse visit will consist of discussing your medical and surgical history, your immediate family medical history, and your medications.    Please bring a complete list of all your medications or, if you prefer, bring the medication bottles and we will list them.  We will need to be aware of both prescribed and over the counter drugs.  We will need to know exact dosage information as well.  If you are on blood thinners (Coumadin, Plavix, Aggrenox, Ticlid, etc.) please call our office today/prior to your appointment, as we need to consult with your physician about holding your medication.   Please be prepared to read and sign documents such as consent forms, a financial agreement, and acknowledgement forms.  If necessary, and with your consent, a friend or relative is welcome to sit-in on the nurse visit with you.  Please bring your insurance card so that we may make a copy of it.  If your insurance requires a referral to see a specialist, please bring your referral form from your primary care physician.  No co-pay is required for this nurse visit.     If you cannot keep your appointment, please call [phone] to cancel or reschedule prior to your appointment date.  This allows [place] the opportunity to schedule an appointment for another patient in need of care.    Thank you for choosing  Gastroenterology for your  medical needs.  We appreciate the opportunity to care for you.  Please visit [place] at our website  to learn more about our practice.                     Sincerely.                                                                                                                   The Gastroenterology Division

## 2011-01-08 NOTE — Letter (Signed)
Summary: [facility]   Imported By: Lanelle Bal 07/28/2010 10:29:21  _____________________________________________________________________  External Attachment:    Type:   Image     Comment:   External Document

## 2011-01-08 NOTE — Progress Notes (Signed)
Summary: MRI RESULTS  Phone Note Outgoing Call Call back at [facility] Phone [phone]   Call placed by: Shonna Chock,  August 17, 2008 10:27 AM Call placed to: Patient Details for Reason: MRI RESULTS Summary of Call: PATIENT OK'D INFORMATION. PATIENT SAID SHE IS ALREADY IN THE PROCESS OF SETTING UP APPOINTMENT WITH DR.DOHIMER./[facility]  August 17, 2008 10:28 AM

## 2011-01-08 NOTE — Assessment & Plan Note (Signed)
Summary: earache/cbs   Vital Signs:  Patient profile:   60 year old female Weight:      213.2 pounds BMI:     36.73 Temp:     98.0 degrees F oral Pulse rate:   76 / minute Resp:     17 per minute BP sitting:   118 / 74  (left arm) Cuff size:   large  Vitals Entered By: Shonna Chock (May 30, 2009 10:44 AM) CC: EARACHE X SEVERAL DAYS-TOOTH/GUM PAIN X COUPLE WEEKS. LEFT EAR IS WORSE Comments REVIEWED MED LIST, PATIENT AGREED DOSE AND INSTRUCTION CORRECT    CC:  EARACHE X SEVERAL DAYS-TOOTH/GUM PAIN X COUPLE WEEKS. LEFT EAR IS WORSE.  History of Present Illness: Onset as teeth/gum pain 2 weeks ago ; dental films negative. Now variable pain intermittently in & below L ear. Rx: ear drops  Allergies: 1)  ! Darvon 2)  ! Darvocet 3)  ! * Antihistamines 4)  ! Augmentin 5)  Vicodin  Review of Systems General:  Complains of sweats; denies chills and fever; Hot flashes. ENT:  See HPI; Complains of ear discharge and nasal congestion; denies sinus pressure; No frontal headaches or purulence; minor facial pain & intermittent ST. Ear D/C clear. Resp:  Denies cough and sputum productive.  Physical Exam  General:  in no acute distress; alert,appropriate and cooperative throughout examination Ears:  Erythema  L TM; R TM WNL. Dried material L canal. Nose:  External nasal examination shows no deformity or inflammation. Nasal mucosa are pink and moist without lesions or exudates. Mouth:  Oral mucosa and oropharynx without lesions or exudates.  Teeth in good repair. Cervical Nodes:  No lymphadenopathy noted Axillary Nodes:  No palpable lymphadenopathy   Impression & Recommendations:  Problem # 1:  UNSPECIFIED OTITIS MEDIA (ICD-382.9)  Her updated medication list for this problem includes:    Advil 200 Mg Tabs (Ibuprofen) .Marland Kitchen... As needed headaches    Indocin 1 Mg Solr (Indomethacin sodium) .Marland Kitchen... As needed for migraines    Smz-tmp Ds 800-160 Mg Tabs (Sulfamethoxazole-trimethoprim) .Marland Kitchen... 1  two times a day with 8 oz water  Complete Medication List: 1)  Vivelle-dot 0.075 Mg/24hr Pttw (Estradiol) .... Change patch 2 times q week 2)  Abilify 5 Mg Tabs (Aripiprazole) .... 1/2 qd 3)  Zomig 5 Mg Tabs (Zolmitriptan) .... Take as needed for migraine 4)  Gabapentin 100 Mg Caps (Gabapentin) .Marland Kitchen.. 1 -2 q 8 hrs as needed for headache 5)  Propranolol Hcl Cr 60 Mg Xr24h-cap (Propranolol hcl) .Marland Kitchen.. 1 q 12 hours for migraine prevention & rapid heart rate 6)  Advil 200 Mg Tabs (Ibuprofen) .... As needed headaches 7)  Indocin 1 Mg Solr (Indomethacin sodium) .... As needed for migraines 8)  Celexa(? Dose)  .Marland Kitchen.. 1 by mouth at bedtime 9)  Cortisporin-tc 3.02-06-09-0.5 Mg/ml Susp (Neomycin-colist-hc-thonzonium) .... 3 drops qid 10)  Smz-tmp Ds 800-160 Mg Tabs (Sulfamethoxazole-trimethoprim) .Marland Kitchen.. 1 two times a day with 8 oz water  Patient Instructions: 1)  Neti pot once daily as needed for head congestion. 2)  Drink as much fluid as you can tolerate for the next few days. Prescriptions: SMZ-TMP DS 800-160 MG TABS (SULFAMETHOXAZOLE-TRIMETHOPRIM) 1 two times a day with 8 oz water  #20 x 0   Entered and Authorized by:   Marga Melnick MD   Signed by:   Marga Melnick MD on 05/30/2009   Method used:   Print then Give to Patient   RxID:   [id number] CORTISPORIN-TC 3.02-06-09-0.5 MG/ML SUSP (NEOMYCIN-COLIST-HC-THONZONIUM)  3 drops qid  #10cc x 0   Entered and Authorized by:   Marga Melnick MD   Signed by:   Marga Melnick MD on 05/30/2009   Method used:   Print then Give to Patient   RxID:   [id number]

## 2011-01-08 NOTE — Progress Notes (Signed)
Summary: referral  Phone Note Call from Patient Call back at Home Phone [phone]   Caller: Patient Summary of Call: Pt left VM that she would like to be referred to [facility] or her chronic migraine. pls advise.............Marland KitchenFelecia Deloach CMA  Apr 10, 2010 12:15 PM   Follow-up for Phone Call        PT AWARE REFERRAL PUT IN AWAITING APPT INFO.........................Marland KitchenFelecia Deloach CMA  Apr 10, 2010 1:24 PM

## 2011-01-08 NOTE — Assessment & Plan Note (Signed)
Summary: 1 week f/u//lch   Vital Signs:  Patient profile:   60 year old female Menstrual status:  hysterectomy Weight:      203.8 pounds BMI:     36.23 Temp:     98.2 degrees F oral Pulse rate:   64 / minute Resp:     14 per minute BP sitting:   114 / 70  (left arm) Cuff size:   large  Vitals Entered By: Shonna Chock CMA (December 29, 2010 10:40 AM) CC: 1 week follow-up on B/P, patient states she had a good weekend: B/P down and no headaches x 4 days ( rare for patient, usually with headaches daily)   CC:  1 week follow-up on B/P, patient states she had a good weekend: B/P down and no headaches x 4 days ( rare for patient, and usually with headaches daily).  History of Present Illness:    BP was 170/114 0n 0115/2012; HCTZ was called in 01/16. Since med started BP  134/ 99 -147/100.   The patient denies lightheadedness, urinary frequency, headaches ( after 4 days of HCTZ), edema, and fatigue.  Associated symptoms include intermittent  palpitations.  The patient denies the following associated symptoms:exertional  chest pain, chest pressure, dyspnea, and syncope.  The patient reports no exercise.  She does not restrict  salt .  Current Medications (verified): 1)  Vivelle-Dot 0.075 Mg/24hr  Pttw (Estradiol) .... Change Patch 2 Times Q Week 2)  Zomig 5 Mg Tabs (Zolmitriptan) .... Take As Needed For Migraine 3)  Propranolol Hcl Cr 60 Mg Xr24h-Cap (Propranolol Hcl) .Marland Kitchen.. 1 Q 12 Hours For Migraine Prevention & Rapid Heart Rate **appointment Due 11/2010** 4)  Indocin 1 Mg Solr (Indomethacin Sodium) .... As Needed For Migraines 5)  Xanax Xr 0.5 Mg Xr24h-Tab (Alprazolam) .Marland Kitchen.. 1 Tab By Mouth Prn 6)  Nystatin 100000 Unit/gm Crea (Nystatin) .... Apply Twice Daily To Affected Area 7)  Hydrochlorothiazide 12.5 Mg  Tabs (Hydrochlorothiazide) .... Take 1 Tab  By Mouth Every Morning 8)  Pristiq 100 Mg Xr24h-Tab (Desvenlafaxine Succinate) .... Take One Tablet Two Times A Day  Allergies: 1)  !  Darvon 2)  ! Darvocet 3)  ! * Antihistamines 4)  ! Augmentin 5)  Vicodin  Physical Exam  General:  well-nourished,in no acute distress; alert,appropriate and cooperative throughout examination Eyes:  No corneal or conjunctival inflammation noted.  Funduscopic exam benign, without hemorrhages, exudates or papilledema.  Pupils dikated Lungs:  Normal respiratory effort, chest expands symmetrically. Lungs are clear to auscultation, no crackles or wheezes. Heart:  regular rhythm, no gallop, no rub, no JVD, no HJR, bradycardia, and grade 1/2 - 1  /6 systolic murmur.   Abdomen:  Bowel sounds positive,abdomen soft and non-tender without masses, organomegaly or hernias noted. No AAA or bruits Pulses:  R and L carotid,radial,dorsalis pedis and posterior tibial pulses are full and equal bilaterally Extremities:  No clubbing, cyanosis, edema, or deformity noted . Skin:  Intact without suspicious lesions or rashes Psych:  memory intact for recent and remote, normally interactive, and good eye contact.     Impression & Recommendations:  Problem # 1:  HYPERTENSION (ICD-401.9)  Her updated medication list for this problem includes:    Propranolol Hcl Cr 60 Mg Xr24h-cap (Propranolol hcl) .Marland Kitchen... 1 q 12 hours for migraine prevention & rapid heart rate     Hydrochlorothiazide 12.5 Mg Tabs (Hydrochlorothiazide) .Marland Kitchen... Take 1 tab  by mouth every morning  Problem # 2:  MIGRAINE VARIANT (ICD-346.20)  Her  updated medication list for this problem includes:    Zomig 5 Mg Tabs (Zolmitriptan) .Marland Kitchen... Take as needed for migraine    Propranolol Hcl Cr 60 Mg Xr24h-cap (Propranolol hcl) .Marland Kitchen... 1 q 12 hours for migraine prevention & rapid heart rate    Indocin 1 Mg Solr (Indomethacin sodium) .Marland Kitchen... As needed for migraines  Complete Medication List: 1)  Vivelle-dot 0.075 Mg/24hr Pttw (Estradiol) .... Change patch 2 times q week 2)  Zomig 5 Mg Tabs (Zolmitriptan) .... Take as needed for migraine 3)  Propranolol Hcl Cr 60  Mg Xr24h-cap (Propranolol hcl) .Marland Kitchen.. 1 q 12 hours for migraine prevention & rapid heart rate **appointment due 11/2010** 4)  Indocin 1 Mg Solr (Indomethacin sodium) .... As needed for migraines 5)  Xanax Xr 0.5 Mg Xr24h-tab (Alprazolam) .Marland Kitchen.. 1 tab by mouth prn 6)  Nystatin 100000 Unit/gm Crea (Nystatin) .... Apply twice daily to affected area 7)  Hydrochlorothiazide 12.5 Mg Tabs (Hydrochlorothiazide) .... Take 1 tab  by mouth every morning 8)  Pristiq 100 Mg Xr24h-tab (Desvenlafaxine succinate) .... Take one tablet two times a day  Patient Instructions: 1)  Please schedule  follow-up  fasting  labs 5 -7 days  before CPX 01/26/2011. 2)  Check your Blood Pressure regularly. your goal = AVERAGE BP of < 135/85.Verify Propranolol dose.   Orders Added: 1)  Est. Patient Level III [32951]

## 2011-01-08 NOTE — Assessment & Plan Note (Signed)
Summary: CPX AND FASTING LABS////SPH   Vital Signs:  Patient profile:   60 year old female Menstrual status:  hysterectomy Height:      63 inches Weight:      211.2 pounds BMI:     37.55 O2 Sat:      07 % on Room air Temp:     97.4 degrees F oral Pulse rate:   54 / minute Pulse rhythm:   regular BP sitting:   138 / 80  (left arm) Cuff size:   regular  Vitals Entered By: Arman Filter (November 08, 2009 10:20 AM)  O2 Flow:  Room air  CC:  cpx.  History of Present Illness: Ebony Garcia is a 60 year old female who presents today for a complete physical.  Notes last TD over 10 years ago,  would also like a flu shot.  Up to date on Mammo/Pap.  C/o migraine today- woke up at 5:30 am with ha.  Notes chronic migraines x 22 years.  Used to occur nearly daily, now 1x a week.  Patient uses zomid, acupuncture, chiropractic with good relief.  Also receives trigger point injections as needed.    Recently started weight watchers- has started walking on treadmill 10 minutes/day  Allergies: 1)  ! Darvon 2)  ! Darvocet 3)  ! * Antihistamines 4)  ! Augmentin 5)  Vicodin  Past History:  Past Medical History: Last updated: 08/09/2008 RENAL CALCULI (4/06) WEIGHT GAIN (ICD-783.1) SNORING, HX OF (ICD-V15.89) DISORDER, DEPRESSIVE NEC (ICD-311) MIGRAINE (ICD-346.90) RENAL CALCULUS (ICD-592.0) Hypertension Hyperlipidemia(Triglycerides 301)  Family History: Father: bladder CA, shunt for hydrocephalus, prostate cancer Mother: CROHN'S, DIVERTICULOSIS, CHF AF Siblings: bro & sister ? bipolar  PGF-CA ?TYPE MG AUNT-CA ?TYPE MGM-MI UNCLE-LITHIUM for Bipolar Disorder 2ND COUSIN HODGKINS DISEASE AUNT- NERVOUS BREAKDOWN MATERNAL SIDE HAS MIGRAINES AT LEAST 10 PEOPLE  Social History: 3 sons on disability due to chronic migraines. denies ETOH, non-smoker  Review of Systems       Constitutional: Denies Fever ENT:  Denies nasal congestion or sore throat. Resp: Denies cough CV:  notes  intermittent Chest Pain since 1980's, had stress test several years ago- happens at rest some times. GI:  Denies nausea or vomitting GU: Denies dysuria Lymphatic: Denies lymphadenopathy Musculoskeletal:  Denies muscle/joint pain Skin:  notes +  Rashes in groin using zinc oxide Psychiatric: + history of depression well controlled on citalopram. Neuro: Denies numbness     Physical Exam  General:  Well-developed, obese female,in no acute distress; alert,appropriate and cooperative throughout examination Eyes:  PERRLA Ears:  External ear exam shows no significant lesions or deformities.  Otoscopic examination reveals clear canals, tympanic membranes are intact bilaterally without bulging, retraction, inflammation or discharge. Hearing is grossly normal bilaterally. Mouth:  Oral mucosa and oropharynx without lesions or exudates.  Teeth in good repair. Neck:  No deformities, masses, or tenderness noted. Lungs:  Normal respiratory effort, chest expands symmetrically. Lungs are clear to auscultation, no crackles or wheezes. Heart:  Normal rate and regular rhythm. S1 and S2 normal without gallop, murmur, click, rub or other extra sounds. Abdomen:  Bowel sounds positive,abdomen soft and non-tender without masses, organomegaly or hernias noted. Msk:  MAE, no joint swelling.   Neurologic:  alert & oriented X3 and strength normal in all extremities.   Cervical Nodes:  No lymphadenopathy noted Psych:  Cognition and judgment appear intact. Alert and cooperative with normal attention span and concentration. No apparent delusions, illusions, hallucinations   Impression & Recommendations:  Problem #  1:  Preventive Health Care (ICD-V70.0) Assessment Comment Only will check baseline labs, EKG, wants flu/ and tetanus today.  Discussed importance of diet and exercise. Never had colo- will order  Problem # 2:  INTERTRIGO (ZOX-096.04) Assessment: New add nystatin ointment  Problem # 3:  CHEST PAIN,  ATYPICAL, HX OF (ICD-V15.89) Assessment: Unchanged  recommended stress test- patient refused.  Reports previous bad experience with exercise and nuclear stress test. Agreeable to EKG only,  reinforced possible risk of death if + ischemia goes undiagnosed- patient states "I'll take my chances"  Orders: EKG w/ Interpretation (93000)  Problem # 4:  HYPERTENSION (ICD-401.9) Assessment: Deteriorated BP up a little today, recommended that patient return in 2 months for repeat bp check- if still elevated consider adding a diuretic to her regimen. Her updated medication list for this problem includes:    Propranolol Hcl Cr 60 Mg Xr24h-cap (Propranolol hcl) .Marland Kitchen... 1 q 12 hours for migraine prevention & rapid heart rate  BP today: 138/80 Prior BP: 118/74 (05/30/2009)  Prior 10 Yr Risk Heart Disease: Not enough information (02/27/2008)  Labs Reviewed: K+: 4.3 (08/15/2008) Creat: : 0.89 (11/02/2007)   Chol: 170 (02/03/2008)   HDL: 34.6 (02/03/2008)   LDL: DEL (02/03/2008)   TG: 301 (02/03/2008)  Problem # 5:  MIGRAINE VARIANT (ICD-346.20) Assessment: Comment Only  patient is satified with current therapies, continue same  Complete Medication List: 1)  Vivelle-dot 0.075 Mg/24hr Pttw (Estradiol) .... Change patch 2 times q week 2)  Abilify 5 Mg Tabs (Aripiprazole) .... 1/2 qd 3)  Zomig 5 Mg Tabs (Zolmitriptan) .... Take as needed for migraine 4)  Gabapentin 100 Mg Caps (Gabapentin) .Marland Kitchen.. 1 -2 q 8 hrs as needed for headache 5)  Propranolol Hcl Cr 60 Mg Xr24h-cap (Propranolol hcl) .Marland Kitchen.. 1 q 12 hours for migraine prevention & rapid heart rate 6)  Advil 200 Mg Tabs (Ibuprofen) .... As needed headaches 7)  Indocin 1 Mg Solr (Indomethacin sodium) .... As needed for migraines 8)  Celexa(? Dose)  .Marland Kitchen.. 1 by mouth at bedtime 9)  Cortisporin-tc 3.02-06-09-0.5 Mg/ml Susp (Neomycin-colist-hc-thonzonium) .... 3 drops qid 10)  Smz-tmp Ds 800-160 Mg Tabs (Sulfamethoxazole-trimethoprim) .Marland Kitchen.. 1 two times a day with  8 oz water 11)  Xanax Xr 0.5 Mg Xr24h-tab (Alprazolam) .Marland Kitchen.. 1 tab by mouth prn 12)  Nystatin 100000 Unit/gm Crea (Nystatin) .... Apply twice daily to affected area  Other Orders: Gastroenterology Referral (GI) Influenza Vaccine MCR (54098) Tdap => 58yrs IM (11914) Admin 1st Vaccine (78295) TLB-BMP (Basic Metabolic Panel-BMET) (80048-METABOL) TLB-ALT (SGPT) (84460-ALT) TLB-AST (SGOT) (84450-SGOT) TLB-CBC Platelet - w/Differential (85025-CBCD) TLB-TSH (Thyroid Stimulating Hormone) (84443-TSH)  Patient Instructions: 1)  You will be mailed a copy of your lab reports.  We will call you if they are abnormal. 2)  Continue your weight loss efforts. 3)  Have a great Holiday! 4)  Please follow up in 2 months for a BP check.   5)  Limit your Sodium (Salt). Prescriptions: NYSTATIN 100000 UNIT/GM CREA (NYSTATIN) apply twice daily to affected area  #1 x 1   Entered and Authorized by:   Lemont Fillers FNP   Signed by:   Lemont Fillers FNP on 11/08/2009   Method used:   Electronically to        [facility]  [phone]* (retail)       [address] [place], [place]  [address]       Ph: [phone] or [phone]  Fax: [phone]   RxID:   [id number]    Preventive Care Screening  Mammogram:    Date:  05/21/2009    Results:  normal   Pap Smear:    Date:  04/17/2009    Results:  normal    Current Allergies (reviewed today): ! DARVON ! DARVOCET ! * ANTIHISTAMINES ! AUGMENTIN VICODIN   Immunizations Administered:  Influenza Vaccine # 1:    Vaccine Type: Fluvax MCR    Site: left buttock    Mfr: GlaxoSmithKline    Dose: 0.5 ml    Route: IM    Given by: Arman Filter    Exp. Date: 06/05/2010    Lot #: [id number]    VIS given: 06/30/07 version given November 08, 2009.  Tetanus Vaccine:    Vaccine Type: Tdap    Site: right buttock    Mfr: GlaxoSmithKline    Dose: 0.5 ml    Route: IM    Given by: Arman Filter    Exp. Date: 06/22/2011    Lot #:  [id number]    VIS given: 10/25/07 version given November 08, 2009.  Flu Vaccine Consent Questions:    Do you have a history of severe allergic reactions to this vaccine? no    Any prior history of allergic reactions to egg and/or gelatin? no    Do you have a sensitivity to the preservative Thimersol? no    Do you have a past history of Guillan-Barre Syndrome? no    Do you currently have an acute febrile illness? no    Have you ever had a severe reaction to latex? no    Vaccine information given and explained to patient? yes    Are you currently pregnant? no

## 2011-01-08 NOTE — Consult Note (Signed)
Summary: [facility]  [facility]   Imported By: Lanelle Bal 05/07/2009 11:03:37  _____________________________________________________________________  External Attachment:    Type:   Image     Comment:   External Document

## 2011-01-08 NOTE — Progress Notes (Signed)
Summary: hopper--refill  Phone Note Refill Request Call back at Home Phone [phone]   Refills Requested: Medication #1:  SYMBYAX 6-50 MG  CAPS 1 once daily  Medication #2:  ZOMIG 5 MG  TABS TAKE AS NEED[facility]  [phone]  Initial call taken by: Freddy Jaksch,  December 15, 2007 12:47 PM  Follow-up for Phone Call        DEPRESSION AND MIGRAINE MEDICATIONS, WILL FORWARD TO DR.HOPPER FOR APPROVAL OF DISPENSE# AND REFILL #. THEN BACK TO [facility]. Follow-up by: Shonna Chock,  December 15, 2007 4:02 PM      Prescriptions: ZOMIG 5 MG  TABS (ZOLMITRIPTAN) TAKE AS NEEDED  #15 x 5   Entered by:   Ardyth Man   Authorized by:   Marga Melnick MD   Signed by:   Ardyth Man on 12/15/2007   Method used:   Telephoned to ...         RxID:   [id number] SYMBYAX 6-50 MG  CAPS (OLANZAPINE-FLUOXETINE HCL) 1 once daily  #30 x 0   Entered by:   Ardyth Man   Authorized by:   Marga Melnick MD   Signed by:   Ardyth Man on 12/15/2007   Method used:   Telephoned to ...         RxID:   [id number] ZOMIG 5 MG  TABS (ZOLMITRIPTAN) TAKE AS NEEDED  #15 x 5   Entered by:   Ardyth Man   Authorized by:   Marga Melnick MD   Signed by:   Ardyth Man on 12/15/2007   Method used:   Print then Give to Patient   RxID:   [id number] SYMBYAX 6-50 MG  CAPS (OLANZAPINE-FLUOXETINE HCL) 1 once daily  #30 x 0   Entered by:   Ardyth Man   Authorized by:   Marga Melnick MD   Signed by:   Ardyth Man on 12/15/2007   Method used:   Print then Give to Patient   RxID:   [id number]

## 2011-01-08 NOTE — Letter (Signed)
Summary: Patient Notice- Polyp Results  [facility]  [address] [place], [place] [address]   Phone: [phone]  Fax: [phone]        December 13, 2009 MRN: [id number]    Ebony Garcia [address] [place], [place]  [address]    Dear [name],  I am pleased to inform you that the colon polyp(s) removed during your recent colonoscopy was (were) found to be benign (no cancer detected) upon pathologic examination.  I recommend you have a repeat colonoscopy examination in 5 years to look for recurrent polyps, as having colon polyps increases your risk for having recurrent polyps or even colon cancer in the future.  Should you develop new or worsening symptoms of abdominal pain, bowel habit changes or bleeding from the rectum or bowels, please schedule an evaluation with either your primary care physician or with me.  Continue treatment plan as outlined the day of your exam.  Please call us if you are having persistent problems or have questions about your condition that have not been fully answered at this time.  Sincerely,  Meryl Dare MD [facility]  This letter has been electronically signed by your physician.  Appended Document: Patient Notice- Polyp Results Letter mailed 01.12.11

## 2011-01-08 NOTE — Procedures (Signed)
Summary: Colonoscopy  Patient: [name] Note: All result statuses are Final unless otherwise noted.  Tests: (1) Colonoscopy (COL)   COL Colonoscopy           DONE     [facility]     [address]     [place], [place]  [address]           COLONOSCOPY PROCEDURE REPORT           PATIENT:  Ebony Garcia, Ebony Garcia  MR#:  [id number]     BIRTHDATE:  06-09-1951, 58 yrs. old  GENDER:  female           ENDOSCOPIST:  Judie Petit T. Russella Dar, MD, [facility]     Referred by:  Marga Melnick, M.D.           PROCEDURE DATE:  12/12/2009     PROCEDURE:  Colonoscopy with snare polypectomy     ASA CLASS:  Class II     INDICATIONS:  1) Routine Risk Screening           MEDICATIONS:   Fentanyl 100 mcg IV, Versed 8 mg IV           DESCRIPTION OF PROCEDURE:   After the risks benefits and     alternatives of the procedure were thoroughly explained, informed     consent was obtained.  Digital rectal exam was performed and     revealed no abnormalities.   The LB [id number]-[id number] endoscope     was introduced through the anus and advanced to the cecum, which     was identified by both the appendix and ileocecal valve, without     limitations.  The quality of the prep was excellent, using     MoviPrep.  The instrument was then slowly withdrawn as the colon     was fully examined.     <<PROCEDUREIMAGES>>           FINDINGS:  A sessile polyp was found in the cecum. It was 4 mm in     size. Polyp was snared without cautery. Retrieval was successful.     A sessile polyp was found in the mid transverse colon. It was 4 mm     in size. Polyp was snared without cautery. Retrieval was     successful.  A normal appearing ileocecal valve, and appendiceal     orifice were identified. The ascending, hepatic flexure, splenic     flexure, descending, sigmoid colon, and rectum appeared     unremarkable. Retroflexed views in the rectum revealed no     abnormalities. The time to cecum =  1.33  minutes. The scope was     then withdrawn (time =  10.75  min) from the patient and the     procedure completed.           COMPLICATIONS:  None           ENDOSCOPIC IMPRESSION:     1) 4 mm sessile polyp in the cecum     2) 4 mm sessile polyp in the mid transverse colon           RECOMMENDATIONS:     1) Await pathology results     2) If the polyps removed today are adenomatous (pre-cancerous),     you will need a repeat colonoscopy in 5 years. Otherwise you     should continue to follow colorectal cancer screening guidelines     for "routine risk" patients with  colonoscopy in 10 years.     Venita Lick. Russella Dar, MD, [facility]           n.     eSIGNED:   Venita Lick. Jasin Brazel at 12/12/2009 11:57 AM           Ebony Garcia, Tomisha, [id number]  Note: An exclamation mark (!) indicates a result that was not dispersed into the flowsheet. Document Creation Date: 12/12/2009 1:00 PM _______________________________________________________________________  (1) Order result status: Final Collection or observation date-time: 12/12/2009 11:53 Requested date-time:  Receipt date-time:  Reported date-time:  Referring Physician:   Ordering Physician: Claudette Head [phone]) Specimen Source:  Source: Launa Grill Order Number: [id number] Lab site:   Appended Document: Colonoscopy     Procedures Next Due Date:    Colonoscopy: 12/2014

## 2011-01-08 NOTE — Letter (Signed)
Summary: Proof of Physical Form/[facility]  Proof of Physical Form/[facility]   Imported By: Lanelle Bal 11/15/2009 09:49:28  _____________________________________________________________________  External Attachment:    Type:   Image     Comment:   External Document

## 2011-01-08 NOTE — Letter (Signed)
Summary: Primary Care Consult Scheduled Letter  [facility]  [address] [place], [place] [address]   Phone: [phone]  Fax: [phone]      11/11/2009 MRN: [id number]  Ebony Garcia [address] [place], [place]  [address]    Dear [name],    We have scheduled an appointment for you.  At the recommendation of Sandford Craze, FNP, we have scheduled you with [facility] for a Screening Colonoscopy.  Your initial Pre-Visit with a Registered Nurse is on 11-25-09 at 11:00am, and your Screening Colonoscopy is on 12-12-2009 arriving by 10:30am with Dr. Russella Dar.  Their address is [address] [place] [address]. The office phone number is [phone].  If this appointment day and time is not convenient for you, please feel free to call the office of the doctor you are being referred to at the number listed above and reschedule the appointment.    It is important for you to keep your scheduled appointments. We are here to make sure you are given good patient care.   Thank you,    Renee, Patient Care Coordinator [place] [facility]

## 2011-01-08 NOTE — Assessment & Plan Note (Signed)
Vital Signs:  Patient Profile:   60 Years Old Female Height:     64 inches O2 Sat:      96 % Temp:     97.8 degrees F Resp:     18 per minute BP supine:   121 / 59                 History of Present Illness:  Discharge Summary:Headache is minimal; no additional Imitrex required. K+ 4.2 post repletion(3.1 @ admission ; she had had N&V X3 preadmission). WBC, H/H, UA. Seen in consult by [facility]; CRP 5.6 & sed rate 15. MRI ordered but dferred as intractable migraine resolved post 1 dose of Imitrex. PTA Zomig 5 mg X 3 of no benefit. Glucose 113 but eating hyperglycemic carbs. AF off parenteral antibiotics.    Current Allergies: ! DARVON ! DARVOCET ! * ANTIHISTAMINES ! AUGMENTIN VICODIN     Review of Systems  General      Denies chills, fever, and sweats.  GI      Denies abdominal pain and change in bowel habits.  GU      Denies dysuria and hematuria.      No flank pain now  Neuro      Complains of headaches.      Denies numbness, tingling, visual disturbances, and weakness.   Physical Exam  General:     in no acute distress;appropriate and cooperative throughout examination Lungs:     Normal respiratory effort, chest expands symmetrically. Lungs are clear to auscultation, no crackles or wheezes. Heart:     Normal rate and regular rhythm. S1 and S2 normal without gallop, murmur, click, rub or other extra sounds. Abdomen:     Bowel sounds positive,abdomen soft and non-tender without masses, organomegaly or hernias noted.Protuberant Msk:     No flank pain to percussion Skin:     Intact without suspicious lesions or rashes. Bruise RUE post IV. Warm & dry Psych:     Oriented X3, memory intact for recent and remote, normally interactive, and not anxious appearing.      Impression & Recommendations:  Problem # 1:  HYPOTENSION (ICD-458.9) resolved  Problem # 2:  CHILLS WITHOUT FEVER (ICD-780.64) resolved  Problem # 3:  FLANK PAIN, RIGHT  (ICD-789.09) resolved  Problem # 4:  MIGRAINE (ICD-346.90) intractable PTA; controlled @ D/C The following medications were removed from the medication list:    Atenolol 100 Mg Tabs (Atenolol) .Marland Kitchen... Take once daily    Zomig 5 Mg Tabs (Zolmitriptan) .Marland Kitchen... Take as needed  Her updated medication list for this problem includes:    Sumatriptan Succinate 100 Mg Tabs (Sumatriptan succinate) .Marland Kitchen... 1 as needed for migraine, may repeat 2 hrs later (no more than 200 mg in 24 hours)  Orders: Neurology Referral (Neuro)   Complete Medication List: 1)  Vivelle-dot 0.075 Mg/24hr Pttw (Estradiol) .... Change patch 2 times q week 2)  Abilify 5 Mg Tabs (Aripiprazole) .... 1/2 qd 3)  Cymbalta 30 Mg Cpep (Duloxetine hcl) .Marland Kitchen.. 1 by mouth once daily 4)  Sumatriptan Succinate 100 Mg Tabs (Sumatriptan succinate) .Marland Kitchen.. 1 as needed for migraine, may repeat 2 hrs later (no more than 200 mg in 24 hours)   Patient Instructions: 1)  Keep HA Diary; monitor for prodrome. Excedrin Migraine if prodrome identified. Make appt with Dr Marylou Flesher; she will decide whether MRI necessary since intractable migraine resolved   Prescriptions: SUMATRIPTAN SUCCINATE 100 MG TABS (SUMATRIPTAN SUCCINATE) 1 as needed for migraine, may  repeat 2 hrs later (NO MORE THAN 200 MG IN 24 HOURS)  #6 x 1   Entered by:   Shonna Chock   Authorized by:   Marga Melnick MD   Signed by:   Marga Melnick MD on 08/15/2008   Method used:   Electronically to        [facility] [phone]* (retail)       [address]       [place], [place]  [address]       Ph: [phone]       Fax: [phone]   RxID:   [id number]  ]

## 2011-01-08 NOTE — Letter (Signed)
Summary: [facility]  [facility]   Imported By: Lanelle Bal 07/21/2010 12:22:59  _____________________________________________________________________  External Attachment:    Type:   Image     Comment:   External Document

## 2011-01-08 NOTE — Progress Notes (Signed)
Summary: Troponin results  Phone Note Outgoing Call   Call placed by: Shonna Chock,  August 16, 2008 2:53 PM Call placed to: Patient Summary of Call: PATIENT AWARE RECENT LAB NORMAL. No cardiac injury./[facility]  August 16, 2008 2:59 PM

## 2011-01-08 NOTE — Letter (Signed)
Summary: [facility]  [facility]   Imported By: Job Founds 06/23/2007 13:57:09  _____________________________________________________________________  External Attachment:    Type:   Image     Comment:   Internal Document

## 2011-01-08 NOTE — Miscellaneous (Signed)
Summary: Orders Update  Clinical Lists Changes  Orders: Added new Referral order of Cardiology Referral (Cardiology) - Signed 

## 2011-01-08 NOTE — Progress Notes (Signed)
Summary: STAT LAB RESULT  Phone Note From Other Clinic   Caller: MARK-SPECTRUM Call For: [facility] Details for Reason: STAT RESULTS Summary of Call: TROPONIN 0.05 (NORMAL) REFRENCE RANGE LESS THAN 0.6, DR.HOPPER AWARE. WILL FORWARD TO DR.HOPPERTO GIVE FUTHER INSTRUCTION TO GIVE TO PATIENT. Initial call taken by: Shonna Chock,  August 15, 2008 5:03 PM

## 2011-01-08 NOTE — Progress Notes (Signed)
Summary: Labs for Migraine??  Phone Note Call from Patient   Caller: Patient Summary of Call: pt calls and states that she is having sever migraines and wants to know if there are any labs that can be done to see what is causing this. please advise.[phone] Initial call taken by: Lavell Islam,  July 01, 2010 11:32 AM  Follow-up for Phone Call        Dr.Hopper please advise and forward to [facility] Triage Nurse Follow-up by: Shonna Chock CMA,  July 01, 2010 11:42 AM  Additional Follow-up for Phone Call Additional follow up Details #1::        Per Hopp: Dr. Sharin Grave or neurologist shoudl be conslted as they are the expert in this field.  Additional Follow-up by: Harold Barban,  July 01, 2010 3:48 PM    Additional Follow-up for Phone Call Additional follow up Details #2::    Spoke with patient and agreed to call and speak with Dr. Lewie Loron office.  Follow-up by: Harold Barban,  July 01, 2010 3:48 PM

## 2011-01-08 NOTE — Assessment & Plan Note (Signed)
Summary: 2009 DRUG COVERAGE LIST   Vital Signs:  Patient Profile:   60 Years Old Female Height:     64 inches Weight:      205.13 pounds Pulse rate:   56 / minute Pulse rhythm:   regular Resp:     17 per minute BP sitting:   100 / 64  (left arm) Cuff size:   large  Pt. in pain?   no  Vitals Entered By: Wendall Stade (February 27, 2008 4:20 PM)                  Chief Complaint:  follow up just received her disability.  History of Present Illness: Lipid panel & Lipid Management  reviewed;no specific diet & no CVE.  Lipid Management History:      Positive NCEP/ATP III risk factors include HDL cholesterol less than 40.  Negative NCEP/ATP III risk factors include female age less than 58 years old, no history of early menopause without estrogen hormone replacement, non-diabetic, no family history for ischemic heart disease, non-tobacco-user status, non-hypertensive, no ASHD (atherosclerotic heart disease), no prior stroke/TIA, no peripheral vascular disease, and no history of aortic aneurysm.       Current Allergies (reviewed today): ! DARVON ! DARVOCET ! * ANTIHISTAMINES ! AUGMENTIN VICODIN  Past Medical History:    Reviewed history from 11/02/2007 and no changes required:       MIGRAINES       RENAL CALCULI (4/06)       Current Problems:        WEIGHT GAIN (ICD-783.1)       POLYDIPSIA (ICD-783.5)       SNORING, HX OF (ICD-V15.89)       ROUTINE GENERAL MEDICAL EXAM@HEALTH  CARE FACL (ICD-V70.0)       DISORDER, DEPRESSIVE NEC (ICD-311)       MIGRAINE (ICD-346.90)       RENAL CALCULUS (ICD-592.0)       MIGRAINE HEADACHE (ICD-346.90)  Past Surgical History:    Reviewed history from 11/02/2007 and no changes required:       Hysterectomy & BSO for fibroids & migraines (not MD's opinion as to migraines)       TONSILS/ADENOIDS (T/A)       DEVIATED SEPTUM       TUBAL LIGATION       Giii    P iii   Family History:    Reviewed history from 11/02/2007 and no changes  required:       Father:        Mother: CROHN'S, DIVERTICULOSIS       Siblings:        PGF-CA ?TYPE       MG AUNT-CA ?TYPE       MGM-MI       UNCLE-LETHIUM       2ND COUSIN HODGKINS DISEASE       AUNT- NERVOUS BREAKDOWN       MATERNAL SIDE HAS MIGRAINES AT LEAST 10 PEOPLE  Social History:    Reviewed history from 11/02/2007 and no changes required:       No diet    Review of Systems  General      Complains of fatigue.      Denies chills, fever, sweats, and weight loss.  GI      Denies abdominal pain, bloody stools, constipation, dark tarry stools, diarrhea, and indigestion.      No colonoscopy : "I just don't want to have one" ( Standard of Care  reviewed)   Physical Exam  General:     Well-developed,well-nourished,in no acute distress; alert,appropriate and cooperative throughout examination;overweight-appearing.   Neck:     No deformities, masses, or tenderness noted. Lungs:     Normal respiratory effort, chest expands symmetrically. Lungs are clear to auscultation, no crackles or wheezes. Heart:     Normal rate and regular rhythm. S1 and S2 normal without gallop, murmur, click, rub. S4 Abdomen:     Bowel sounds positive,abdomen soft and non-tender without masses, organomegaly or hernias noted.Protuberant abd Pulses:     R and L carotid,radial,dorsalis pedis and posterior tibial pulses are full and equal bilaterally Skin:     Intact without suspicious lesions or rashes Psych:     Oriented X3, memory intact for recent and remote, flat affect, and subdued.      Impression & Recommendations:  Problem # 1:  HYPERLIPIDEMIA (ICD-272.4)  Problem # 2:  DYSMETABOLIC SYNDROME X (ICD-277.7)  Complete Medication List: 1)  Atenolol 100 Mg Tabs (Atenolol) .... Take once daily 2)  Hydrochlorothiazide 12.5 Mg Caps (Hydrochlorothiazide) .... Take one tablet daily 3)  Zomig 5 Mg Tabs (Zolmitriptan) .... Take as needed 4)  Symbyax 6-50 Mg Caps (Olanzapine-fluoxetine hcl) .Marland Kitchen..  1 once daily 5)  Vivelle-dot 0.075 Mg/24hr Pttw (Estradiol) .... Change patch 2 times q week  Lipid Assessment/Plan:      Based on NCEP/ATP III, the patient's risk factor category is "0-1 risk factors".  From this information, the patient's calculated lipid goals are as follows: Total cholesterol goal is 200; LDL cholesterol goal is 160; HDL cholesterol goal is 40; Triglyceride goal is 150.  Her LDL cholesterol goal has been met.     Patient Instructions: 1)  Complete stool cards annually; consider colonscopy. 2)  Lipid Panel prior to visit, ICD-9: ntil labs277.7 3)  TSH prior to visit, ICD-9:277.7 4)  HbgA1C prior to visit, ICD-9:277.7. Follow The new [employer] low carb  5)  Please schedule a follow-up appointment in 4 months.    ]

## 2011-01-08 NOTE — Assessment & Plan Note (Signed)
Summary: ACUTE ONLY FOR MIRGAINE HEADACHE--PH   Vital Signs:  Patient Profile:   60 Years Old Female Height:     64 inches Weight:      200.8 pounds Temp:     97.4 degrees F oral Pulse rate:   68 / minute Resp:     14 per minute  Vitals Entered By: Shonna Chock (August 09, 2008 4:07 PM)                 Chief Complaint:  MIGRAINES-DISCUSS ZOMIG NOT WORKING PROPERLY.  History of Present Illness: Migraine X 4 days L retroorbital area & occipital area.This is a typical migraine except for lack of response to Zomig. No prodrome or aura with headache.(this is typical). Zomig & Tramadol of NB, but Phenergan by mouth helped nausea (given by Ellis Savage @ [facility] on [address]). Weight loss of 10 # off Symbyax & on Cymbalta & Ambilify.Hallucinating @ night & jerking of "whole body" for 2 months, off Symbyax since early August. Dr Lance Coon aware of these symptoms. Also having severe hot flashes; Black Cohosh caused migraine. Chills intermittently since 08/06/08.    Current Allergies (reviewed today): ! DARVON ! DARVOCET ! * ANTIHISTAMINES ! AUGMENTIN VICODIN  Past Medical History:    RENAL CALCULI (4/06)    WEIGHT GAIN (ICD-783.1)    SNORING, HX OF (ICD-V15.89)    DISORDER, DEPRESSIVE NEC (ICD-311)    MIGRAINE (ICD-346.90)    RENAL CALCULUS (ICD-592.0)    Hypertension    Hyperlipidemia(Triglycerides 301)  Past Surgical History:    Hysterectomy & BSO for fibroids & migraines (not MD's opinion as to migraines)    TONSILS/ADENOIDS (T/A)    DEVIATED SEPTUM    TUBAL LIGATION    Giii    P iii    D&C X1   Family History:    Reviewed history from 06/29/2008 and no changes required:       Father: bladder CA, shunt for hydrocephalus       Mother: CROHN'S, DIVERTICULOSIS       Siblings: bro & sister ? bipolar        PGF-CA ?TYPE       MG AUNT-CA ?TYPE       MGM-MI       UNCLE-LITHIUM for Bipolar Disorder       2ND COUSIN HODGKINS DISEASE       AUNT- NERVOUS  BREAKDOWN       MATERNAL SIDE HAS MIGRAINES AT LEAST 10 PEOPLE  Social History:    Reviewed history from 11/02/2007 and no changes required:       No diet    Review of Systems  General      Complains of chills, fatigue, loss of appetite, sweats, and weight loss.      Denies fever.  Eyes      Denies double vision and vision loss-both eyes.  ENT      Denies difficulty swallowing, hoarseness, nasal congestion, sinus pressure, and sore throat.  CV      Denies chest pain or discomfort, difficulty breathing at night, difficulty breathing while lying down, shortness of breath with exertion, swelling of feet, and swelling of hands.  Resp      Denies cough, coughing up blood, sputum productive, and wheezing.  GI      Complains of nausea and vomiting.      Denies abdominal pain, bloody stools, constipation, dark tarry stools, diarrhea, and indigestion.      N&V X3  08/06/08 or 08/07/08  GU      Denies discharge, dysuria, hematuria, and urinary frequency.      Flank pain on R  MS      Complains of low back pain.      Denies joint pain, joint redness, and joint swelling.  Derm      Denies changes in color of skin and rash.  Neuro      See HPI      Denies brief paralysis, disturbances in coordination, numbness, poor balance, sensation of room spinning, and tingling.  Psych      Complains of alternate hallucination ( auditory/visual) and depression.      Denies panic attacks and sense of great danger.      Visual hallucination  Endo      Complains of cold intolerance.      Denies excessive hunger, excessive thirst, excessive urination, heat intolerance, polyuria, and weight change.  Heme      Denies abnormal bruising and bleeding.  Allergy      Denies hives or rash.   Physical Exam  General:     overweight-appearing, pale, diaphoretic, and uncomfortable-appearing.   Head:     Normocephalic and atraumatic without obvious abnormalities.Scalp damp with hair matted  to head. Eyes:     No corneal or conjunctival inflammation noted. EOMI. Perrla. With accommodation OS deviates to L Ears:     External ear exam shows no significant lesions or deformities.  Otoscopic examination reveals clear canals, tympanic membranes are intact bilaterally without bulging, retraction, inflammation or discharge. Hearing is grossly normal bilaterally. Nose:     External nasal examination shows no deformity or inflammation. Nasal mucosa are pink and moist without lesions or exudates. Mouth:     Oral mucosa and oropharynx without lesions or exudates.  Teeth in good repair. Neck:     No deformities, masses, or tenderness noted.Supple Lungs:     Normal respiratory effort, chest expands symmetrically. Lungs are clear to auscultation, no crackles or wheezes. Heart:     Normal rate and regular rhythm. S1 and S2 normal without gallop, murmur, click, rub or other extra sounds.Unable to get BP X3 (2 Nurses & myself) Abdomen:     Bowel sounds positive,abdomen soft and non-tender without masses, organomegaly or hernias noted.Perotuberant Pulses:     R and L carotid,radial,dorsalis pedis and posterior tibial pulses are full and equal bilaterally Extremities:     No clubbing, cyanosis, edema, or deformity noted with normal full range of motion of all joints.   Neurologic:     strength normal in all extremities, sensation intact to light touch, and DTRs symmetrical and normal.   Skin:     pale, cool & damp.   Cervical Nodes:     No lymphadenopathy noted Axillary Nodes:     No palpable lymphadenopathy Psych:     Oriented X3, flat affect, subdued, and withdrawn.      Impression & Recommendations:  Problem # 1:  HYPOTENSION (ICD-458.9)  Problem # 2:  FLANK PAIN, RIGHT (ICD-789.09)  Problem # 3:  CHILLS WITHOUT FEVER (ICD-780.64)  Problem # 4:  MIGRAINE HEADACHE (ICD-346.90)  Her updated medication list for this problem includes:    Atenolol 100 Mg Tabs (Atenolol) .Marland Kitchen...  Take once daily    Zomig 5 Mg Tabs (Zolmitriptan) .Marland Kitchen... Take as needed   Complete Medication List: 1)  Atenolol 100 Mg Tabs (Atenolol) .... Take once daily 2)  Hydrochlorothiazide 12.5 Mg Caps (Hydrochlorothiazide) .... Take one tablet daily  3)  Zomig 5 Mg Tabs (Zolmitriptan) .... Take as needed 4)  Vivelle-dot 0.075 Mg/24hr Pttw (Estradiol) .... Change patch 2 times q week 5)  Abilify 5 Mg Tabs (Aripiprazole) .... 1/2 qd 6)  Cymbalta 30 Mg Cpep (Duloxetine hcl) .Marland Kitchen.. 1 by mouth once daily   Patient Instructions: 1)  See H&P & Orders   ]

## 2011-01-08 NOTE — Consult Note (Signed)
Summary: [facility]  [facility]   Imported By: Lanelle Bal 06/19/2010 14:23:02  _____________________________________________________________________  External Attachment:    Type:   Image     Comment:   External Document

## 2011-01-08 NOTE — Letter (Signed)
Summary: [facility]  [facility]   Imported By: Lanelle Bal 04/25/2009 11:01:28  _____________________________________________________________________  External Attachment:    Type:   Image     Comment:   External Document

## 2011-01-08 NOTE — Consult Note (Signed)
Summary: [facility]  [facility]   Imported By: Lanelle Bal 11/21/2008 10:22:16  _____________________________________________________________________  External Attachment:    Type:   Image     Comment:   External Document

## 2011-01-08 NOTE — Progress Notes (Signed)
Summary: ZOMIG 5MG  TABLET  Phone Note Refill Request   Refills Requested: Medication #1:  ZOMIG 5MG  TABLET last filled on 12-15-07, last OV 08-15-08  removed from list on 08-11-08   Follow-up for Phone Call        dr hopper please advise on med refill.Felecia Deloach CMA  January 24, 2009 10:54 AM  Additional Follow-up for Phone Call Additional follow up Details #1::        EITHER Zomig OR Sumatriptan ,NOT both Additional Follow-up by: Marga Melnick MD,  January 24, 2009 1:55 PM    Additional Follow-up for Phone Call Additional follow up Details #2::    tried to contact pt phone # changed to undisclosed #. Spoke with pharmacy to have them inform pt that she is not to take both med that it is either one or the other not both.Marti Sleigh Deloach CMA  January 24, 2009 4:20 PM

## 2011-01-08 NOTE — Letter (Signed)
Summary: [facility]   Imported By: Lanelle Bal 08/27/2010 14:03:45  _____________________________________________________________________  External Attachment:    Type:   Image     Comment:   External Document

## 2011-01-08 NOTE — Letter (Signed)
Summary: [facility]  [facility]   Imported By: Lanelle Bal 07/28/2010 10:31:02  _____________________________________________________________________  External Attachment:    Type:   Image     Comment:   External Document

## 2011-01-08 NOTE — Miscellaneous (Signed)
Summary: [facility]  Clinical Lists Changes  Medications: Added new medication of MOVIPREP 100 GM  SOLR (PEG-KCL-NACL-NASULF-NA ASC-C) As per prep instructions. - Signed Rx of MOVIPREP 100 GM  SOLR (PEG-KCL-NACL-NASULF-NA ASC-C) As per prep instructions.;  #1 x 0;  Signed;  Entered by: Ezra Sites RN;  Authorized by: Meryl Dare MD [facility];  Method used: [facility]  [phone]*, [address] [place], [place]  [address], Ph: [phone] or [phone], Fax: [phone] Allergies: Changed allergy or adverse reaction from DARVON to DARVON Changed allergy or adverse reaction from DARVOCET to DARVOCET Changed allergy or adverse reaction from * ANTIHISTAMINES to * ANTIHISTAMINES    Prescriptions: MOVIPREP 100 GM  SOLR (PEG-KCL-NACL-NASULF-NA ASC-C) As per prep instructions.  #1 x 0   Entered by:   Ezra Sites RN   Authorized by:   Meryl Dare MD [facility]   Signed by:   Ezra Sites RN on 11/25/2009   Method used:   [facility]ronically to        [facility]  [phone]* (retail)       [address] [place], [place]  [address]       Ph: [phone] or [phone]       Fax: [phone]   RxID:   [id number]

## 2011-01-08 NOTE — Letter (Signed)
Summary: Primary Care Consult Scheduled Letter  [facility]  [address] [place], [place] [address]   Phone: [phone]  Fax: [phone]      08/20/2008 MRN: [id number]  Ebony Garcia [address] [place], [place]  [address]    Dear [name],      We have scheduled an appointment for you.  At the recommendation of Dr.Hopper, we have scheduled you a consult with Dr. Vickey Huger at [facility] on October 19th at 2:00pm.  Their address is [address] [place] [address] [place]. The office phone number is [phone].  If this appointment day and time is not convenient for you, please feel free to call the office of the doctor you are being referred to at the number listed above and reschedule the appointment.     It is important for you to keep your scheduled appointments. We are here to make sure you are given good patient care. If you have questions or you have made changes to your appointment, please notify us at  [phone], ask for Tiffany.    Thank you,  Patient Care Coordinator [place] at [facility]

## 2011-01-08 NOTE — Letter (Signed)
Summary: [facility]  [facility]   Imported By: Lester Lismore 08/22/2009 10:23:24  _____________________________________________________________________  External Attachment:    Type:   Image     Comment:   External Document

## 2011-01-08 NOTE — Progress Notes (Signed)
Summary: Refill Request  Phone Note Refill Request Call back at [phone] Message from:  Pharmacy on January 02, 2011 2:35 PM  Refills Requested: Medication #1:  PROPRANOLOL HCL CR 60 MG XR24H-CAP 1 q 12 hours for migraine prevention & rapid heart rate **APPOINTMENT DUE 11/2010**   Dosage confirmed as above?Dosage Confirmed   Supply Requested: 60   [facility]  Next Appointment Scheduled: 2.20.12 Initial call taken by: Harold Barban,  January 02, 2011 2:36 PM    New/Updated Medications: PROPRANOLOL HCL CR 60 MG XR24H-CAP (PROPRANOLOL HCL) 1 q 12 hours for migraine prevention & rapid heart rate Prescriptions: PROPRANOLOL HCL CR 60 MG XR24H-CAP (PROPRANOLOL HCL) 1 q 12 hours for migraine prevention & rapid heart rate  #60 x 5   Entered by:   Shonna Chock CMA   Authorized by:   Marga Melnick MD   Signed by:   Shonna Chock CMA on 01/02/2011   Method used:   [facility]ronically to        [facility]  [phone]* (retail)       [address] [place], [place]  [address]       Ph: [phone] or [phone]       Fax: [phone]   RxID:   [id number]

## 2011-01-08 NOTE — Letter (Signed)
Summary: [facility] Instructions  [facility]  [address] [place], [place] [address]   Phone: [phone]  Fax: [phone]       Ebony Garcia    Jul 21, 1951    MRN: [id number]        Procedure Day Dorna Bloom: Magdalene Molly. 01/06     Arrival Time: 10:30am     Procedure Time: 11:30am     Location of Procedure:                    _ X_  [facility] (4th Floor)                        PREPARATION FOR COLONOSCOPY WITH MOVIPREP   Starting 5 days prior to your procedure  01/01 do not eat nuts, seeds, popcorn, corn, beans, peas,  salads, or any raw vegetables.  Do not take any fiber supplements (e.g. Metamucil, Citrucel, and Benefiber).  THE DAY BEFORE YOUR PROCEDURE         DATE: 01/05  DAY: Wed.  1.  Drink clear liquids the entire day-NO SOLID FOOD  2.  Do not drink anything colored red or purple.  Avoid juices with pulp.  No orange juice.  3.  Drink at least 64 oz. (8 glasses) of fluid/clear liquids during the day to prevent dehydration and help the prep work efficiently.  CLEAR LIQUIDS INCLUDE: Water Jello Ice Popsicles Tea (sugar ok, no milk/cream) Powdered fruit flavored drinks Coffee (sugar ok, no milk/cream) Gatorade Juice: apple, white grape, white cranberry  Lemonade Clear bullion, consomm, broth Carbonated beverages (any kind) Strained chicken noodle soup Hard Candy                             4.  In the morning, mix first dose of MoviPrep solution:    Empty 1 Pouch A and 1 Pouch B into the disposable container    Add lukewarm drinking water to the top line of the container. Mix to dissolve    Refrigerate (mixed solution should be used within 24 hrs)  5.  Begin drinking the prep at 5:00 p.m. The MoviPrep container is divided by 4 marks.   Every 15 minutes drink the solution down to the next mark (approximately 8 oz) until the full liter is complete.   6.  Follow completed prep with 16 oz of clear liquid of your choice (Nothing red or  purple).  Continue to drink clear liquids until bedtime.  7.  Before going to bed, mix second dose of MoviPrep solution:    Empty 1 Pouch A and 1 Pouch B into the disposable container    Add lukewarm drinking water to the top line of the container. Mix to dissolve    Refrigerate  THE DAY OF YOUR PROCEDURE      DATE: 01/06  DAY: Magdalene Molly.  Beginning at 6:30 a.m. (5 hours before procedure):         1. Every 15 minutes, drink the solution down to the next mark (approx 8 oz) until the full liter is complete.  2. Follow completed prep with 16 oz. of clear liquid of your choice.    3. You may drink clear liquids until 9:30am  (2 HOURS BEFORE PROCEDURE).   MEDICATION INSTRUCTIONS  Unless otherwise instructed, you should take regular prescription medications with a small sip of water   as early as possible the morning of  your procedure.          OTHER INSTRUCTIONS  You will need a responsible adult at least 58 years of age to accompany you and drive you home.   This person must remain in the waiting room during your procedure.  Wear loose fitting clothing that is easily removed.  Leave jewelry and other valuables at home.  However, you may wish to bring a book to read or  an iPod/MP3 player to listen to music as you wait for your procedure to start.  Remove all body piercing jewelry and leave at home.  Total time from sign-in until discharge is approximately 2-3 hours.  You should go home directly after your procedure and rest.  You can resume normal activities the  day after your procedure.  The day of your procedure you should not:   Drive   Make legal decisions   Operate machinery   Drink alcohol   Return to work  You will receive specific instructions about eating, activities and medications before you leave.    The above instructions have been reviewed and explained to me by   Ezra Sites RN  November 25, 2009 11:30 AM     I fully understand and can  verbalize these instructions _____________________________ Date _________

## 2011-01-08 NOTE — Letter (Signed)
Summary: Results Follow up Letter  [facility]  [address] [place], [place] [address]   Phone: [phone]  Fax: [phone]    08/17/2008 MRN: [id number]  Ebony Garcia [address] [place], [place]  [address]  Dear [name],  The following are the results of your recent test(s):  Test         Result    Pap Smear:        Normal _____  Not Normal _____ Comments: ______________________________________________________ Cholesterol: LDL(Bad cholesterol):         Your goal is less than:         HDL (Good cholesterol):       Your goal is more than: Comments:  ______________________________________________________ Mammogram:        Normal _____  Not Normal _____ Comments:  ___________________________________________________________________ Hemoccult:        Normal _____  Not normal _______ Comments:    _____________________________________________________________________ Other Tests: PLEASE SEE ATTACHED LABS DONE ON 08/15/2008    We routinely do not discuss normal results over the telephone.  If you desire a copy of the results, or you have any questions about this information we can discuss them at your next office visit.   Sincerely,

## 2011-01-08 NOTE — Assessment & Plan Note (Signed)
Summary: [facility]--acute only--tl   Vital Signs:  Patient Profile:   60 Years Old Female Height:     64 inches Weight:      207.2 pounds Temp:     99.6 degrees F oral Pulse rate:   72 / minute Resp:     14 per minute BP sitting:   128 / 78  (left arm) Cuff size:   large  Pt. in pain?   no  Vitals Entered By: Shonna Chock (March 14, 2008 1:14 PM)                  Chief Complaint:  ALLERGIES and Cough.  History of Present Illness:  Cough; Rx: leftover Bromplex      This is a 60 year old woman who presents with Cough.  The patient reports productive cough( BUT she spits it out w/o visualizing sputum )and malaise, but denies non-productive cough, pleuritic chest pain, shortness of breath, wheezing, exertional dyspnea, fever, and hemoptysis.  Associated symtpoms include cold/URI symptoms, nasal congestion, and chronic rhinitis.  The patient denies the following symptoms: sore throat, weight loss, acid reflux symptoms, and peripheral edema.  The cough is worse with lying down.      Current Allergies (reviewed today): ! DARVON ! DARVOCET ! * ANTIHISTAMINES ! AUGMENTIN VICODIN     Review of Systems  Eyes      Denies discharge, eye pain, red eye, and vision loss-both eyes.  ENT      Complains of nasal congestion.      Denies sinus pressure.      No facial pain or purulence   Physical Exam  General:     Well-developed,well-nourished,in no acute distress; alert,appropriate and cooperative throughout examination Eyes:     No corneal or conjunctival inflammation noted. EOMI. Perrla. Vision grossly normal. Ears:     External ear exam shows no significant lesions or deformities.  Otoscopic examination reveals clear canals, tympanic membranes are intact bilaterally without bulging, retraction, inflammation or discharge. Hearing is grossly normal bilaterally. Nose:     External nasal examination shows no deformity or inflammation. Nasal mucosa are pink and moist without  lesions or exudates. Mouth:     Oral mucosa and oropharynx without lesions or exudates.  Teeth in good repair. Lungs:     Normal respiratory effort, chest expands symmetrically. Lungs are clear to auscultation, no crackles or wheezes. Cervical Nodes:     No lymphadenopathy noted Axillary Nodes:     No palpable lymphadenopathy    Impression & Recommendations:  Problem # 1:  BRONCHITIS-ACUTE (ICD-466.0)  Her updated medication list for this problem includes:    Hycodan 5-1.5 Mg/61ml Syrp (Hydrocodone-homatropine) .Marland Kitchen... 1 tsp q 6 hrs prn    Azithromycin 250 Mg Tabs (Azithromycin) .Marland Kitchen... As per  pack   Complete Medication List: 1)  Atenolol 100 Mg Tabs (Atenolol) .... Take once daily 2)  Hydrochlorothiazide 12.5 Mg Caps (Hydrochlorothiazide) .... Take one tablet daily 3)  Zomig 5 Mg Tabs (Zolmitriptan) .... Take as needed 4)  Symbyax 6-50 Mg Caps (Olanzapine-fluoxetine hcl) .Marland Kitchen.. 1 once daily 5)  Vivelle-dot 0.075 Mg/24hr Pttw (Estradiol) .... Change patch 2 times q week 6)  Hycodan 5-1.5 Mg/92ml Syrp (Hydrocodone-homatropine) .Marland Kitchen.. 1 tsp q 6 hrs prn 7)  Azithromycin 250 Mg Tabs (Azithromycin) .... As per  pack   Patient Instructions: 1)  Drink as much fluid as you can tolerate for the next few days.    Prescriptions: AZITHROMYCIN 250 MG  TABS (AZITHROMYCIN) as  per  pack  # 1 pack x 0   Entered and Authorized by:   Marga Melnick MD   Signed by:   Marga Melnick MD on 03/14/2008   Method used:   Print then Give to Patient   RxID:   [id number] HYCODAN 5-1.5 MG/5ML  SYRP (HYDROCODONE-HOMATROPINE) 1 tsp q 6 hrs prn  #120cc x 0   Entered and Authorized by:   Marga Melnick MD   Signed by:   Marga Melnick MD on 03/14/2008   Method used:   Print then Give to Patient   RxID:   [id number] HYDROCHLOROTHIAZIDE 12.5 MG  CAPS (HYDROCHLOROTHIAZIDE) TAKE ONE TABLET DAILY  #90 x 1   Entered and Authorized by:   Marga Melnick MD   Signed by:   Marga Melnick MD on 03/14/2008    Method used:   Print then Give to Patient   RxID:   [id number]  ]

## 2011-01-08 NOTE — Letter (Signed)
Summary: [facility]  [facility]   Imported By: Lanelle Bal 05/23/2009 10:27:24  _____________________________________________________________________  External Attachment:    Type:   Image     Comment:   External Document

## 2011-01-08 NOTE — Assessment & Plan Note (Signed)
Summary: hosp f/u - migraine/infection/cbs   Vital Signs:  Patient Profile:   60 Years Old Female Height:     64 inches Weight:      202.4 pounds O2 Sat:      97 % O2 treatment:    Room Air Temp:     98.5 degrees F oral Pulse rate:   132 / minute Resp:     16 per minute BP sitting:   110 / 78  (left arm) Cuff size:   large  Vitals Entered By: Shonna Chock (August 15, 2008 1:10 PM)                 Chief Complaint:  HOSPITAL FOLLOW-UP-MIGRAINES.  History of Present Illness: Migraines since D/C from hospital 9/05; these ha are diffuse, bilateral & dull  . Imitrex is intermittently beneficial. She has diffuse weakness , sweating & trembling , "as if very hungry".Also "explosions" inside head w/o trigger lasting seconds 3-4 X/week ."The Internet says an aneurysm can cause these or a sleep (related ) issue.    Current Allergies (reviewed today): ! DARVON ! DARVOCET ! * ANTIHISTAMINES ! AUGMENTIN VICODIN     Review of Systems  General      Complains of fatigue, sleep disorder, and sweats.      Denies chills, fever, and weight loss.      Restless , "awake all night". Weakness @ rest  CV      Complains of palpitations and shortness of breath with exertion.      Denies chest pain or discomfort, difficulty breathing at night, and swelling of feet.      Occa racing & skipping; DOE  GI      Denies constipation.      Loose stool last night  Derm      Denies changes in nail beds, dryness, hair loss, lesion(s), and rash.  Neuro      Denies brief paralysis, numbness, and tingling.  Psych      Denies anxiety and depression.  Endo      Complains of excessive thirst and heat intolerance.      Denies cold intolerance, excessive hunger, excessive urination, polyuria, and weight change.  Heme      Bruising from IVs   Physical Exam  General:     well-nourished,in no acute distress; alert,appropriate and cooperative throughout examination Eyes:     No corneal  or conjunctival inflammation noted. EOMI. Perrla. Field of vision grossly normal. Ears:     External ear exam shows no significant lesions or deformities.  Otoscopic examination reveals clear canals, tympanic membranes are intact bilaterally without bulging, retraction, inflammation or discharge. Hearing is grossly normal bilaterally. Nose:     External nasal examination shows no deformity or inflammation. Nasal mucosa are pink and moist without lesions or exudates. Mouth:     Oral mucosa and oropharynx without lesions or exudates.  Teeth in good repair. Neck:     No deformities, masses, or tenderness noted.Thyroid slightly asymmetric w/o nodule Lungs:     Normal respiratory effort, chest expands symmetrically. Lungs are clear to auscultation, no crackles or wheezes. Heart:     regular rhythm and tachycardia.   Neurologic:     alert & oriented X3, cranial nerves II-XII intact, strength normal in all extremities, sensation intact to light touch, gait normal, and DTRs symmetrical and normal.   Skin:     Bruises overR forearm Cervical Nodes:     No lymphadenopathy noted Axillary Nodes:  No palpable lymphadenopathy Psych:     memory intact for recent and remote, normally interactive, and subdued.      Impression & Recommendations:  Problem # 1:  MIGRAINE VARIANT (ICD-346.20) Intracranial "explosions" Her updated medication list for this problem includes:    Sumatriptan Succinate 100 Mg Tabs (Sumatriptan succinate) .Marland Kitchen... 1 as needed for migraine, may repeat 2 hrs later (no more than 200 mg in 24 hours)  Orders: Venipuncture (84132) Radiology Referral (Radiology)   Problem # 2:  TIREDNESS (ICD-780.79)  Orders: Venipuncture (44010) TLB-CBC Platelet - w/Differential (85025-CBCD) TLB-TSH (Thyroid Stimulating Hormone) (84443-TSH) TLB-Potassium (K+) (84132-K) TLB-T4 (Thyrox), Free (27253-GU4Q)   Problem # 3:  TACHYCARDIA (ICD-785.0)  Orders: EKG w/ Interpretation  (93000) Venipuncture (03474) TLB-Potassium (K+) (84132-K) TLB-Magnesium (Mg) (83735-MG)   Problem # 4:  NONSPECIFIC ABNORMAL ELECTROCARDIOGRAM (ICD-794.31)  Orders: TLB-CK-MB (Creatine Kinase MB) (82553-CKMB) T- * Misc. Laboratory test [phone])   Complete Medication List: 1)  Vivelle-dot 0.075 Mg/24hr Pttw (Estradiol) .... Change patch 2 times q week 2)  Abilify 5 Mg Tabs (Aripiprazole) .... 1/2 qd 3)  Cymbalta 30 Mg Cpep (Duloxetine hcl) .Marland Kitchen.. 1 by mouth once daily 4)  Sumatriptan Succinate 100 Mg Tabs (Sumatriptan succinate) .Marland Kitchen.. 1 as needed for migraine, may repeat 2 hrs later (no more than 200 mg in 24 hours) 5)  Gabapentin 100 Mg Caps (Gabapentin) .Marland Kitchen.. 1 -2 q 8 hrs as needed for headache   Patient Instructions: 1)  Keep Headache Diary as discussed   Prescriptions: GABAPENTIN 100 MG CAPS (GABAPENTIN) 1 -2 q 8 hrs as needed for headache  #30 x 2   Entered and Authorized by:   Marga Melnick MD   Signed by:   Marga Melnick MD on 08/15/2008   Method used:   Print then Give to Patient   RxID:   [id number] SUMATRIPTAN SUCCINATE 100 MG TABS (SUMATRIPTAN SUCCINATE) 1 as needed for migraine, may repeat 2 hrs later (NO MORE THAN 200 MG IN 24 HOURS)  #9 x 1   Entered and Authorized by:   Marga Melnick MD   Signed by:   Marga Melnick MD on 08/15/2008   Method used:   [facility] to        [facility] [phone]* (retail)       [address]       [place], [place]  [address]       Ph: [phone]       Fax: [phone]   RxID:   [id number]  ]  Appended Document: hosp f/u - migraine/infection/cbs MRI scheduled emergently for today.She initially declined but I encouraged to complete it ASAP due to symptoms & EKG changes.

## 2011-01-14 NOTE — Letter (Signed)
Summary: [facility]  [facility]   Imported By: Lanelle Bal 01/05/2011 10:09:10  _____________________________________________________________________  External Attachment:    Type:   Image     Comment:   External Document

## 2011-01-21 ENCOUNTER — Other Ambulatory Visit (INDEPENDENT_AMBULATORY_CARE_PROVIDER_SITE_OTHER): Payer: Managed Care, Other (non HMO)

## 2011-01-21 ENCOUNTER — Encounter (INDEPENDENT_AMBULATORY_CARE_PROVIDER_SITE_OTHER): Payer: Self-pay | Admitting: *Deleted

## 2011-01-21 ENCOUNTER — Other Ambulatory Visit: Payer: Self-pay | Admitting: Internal Medicine

## 2011-01-21 DIAGNOSIS — E785 Hyperlipidemia, unspecified: Secondary | ICD-10-CM

## 2011-01-21 DIAGNOSIS — Z0000 Encounter for general adult medical examination without abnormal findings: Secondary | ICD-10-CM

## 2011-01-21 DIAGNOSIS — R7309 Other abnormal glucose: Secondary | ICD-10-CM

## 2011-01-21 LAB — LIPID PANEL
Cholesterol: 170 mg/dL (ref 0–200)
HDL: 37.6 mg/dL — ABNORMAL LOW (ref >39.00)
Total CHOL/HDL Ratio: 5
Triglycerides: 278 mg/dL — ABNORMAL HIGH (ref 0.0–149.0)
VLDL: 55.6 mg/dL — ABNORMAL HIGH (ref 0.0–40.0)

## 2011-01-21 LAB — LDL CHOLESTEROL, DIRECT: Direct LDL: 98.1 mg/dL

## 2011-01-21 LAB — TSH: TSH: 2.84 u[IU]/mL (ref 0.35–5.50)

## 2011-01-21 LAB — BASIC METABOLIC PANEL
BUN: 13 mg/dL (ref 6–23)
CO2: 30 mEq/L (ref 19–32)
Calcium: 9.1 mg/dL (ref 8.4–10.5)
Chloride: 105 mEq/L (ref 96–112)
Creatinine, Ser: 0.7 mg/dL (ref 0.4–1.2)
GFR: 85.88 mL/min (ref >60.00)
Glucose, Bld: 149 mg/dL — ABNORMAL HIGH (ref 70–99)
Potassium: 4.2 mEq/L (ref 3.5–5.1)
Sodium: 140 mEq/L (ref 135–145)

## 2011-01-21 LAB — HEPATIC FUNCTION PANEL
ALT: 19 U/L (ref 0–35)
AST: 18 U/L (ref 0–37)
Albumin: 3.7 g/dL (ref 3.5–5.2)
Alkaline Phosphatase: 86 U/L (ref 39–117)
Bilirubin, Direct: 0.1 mg/dL (ref 0.0–0.3)
Total Bilirubin: 0.3 mg/dL (ref 0.3–1.2)
Total Protein: 6.6 g/dL (ref 6.0–8.3)

## 2011-01-22 LAB — CBC WITH DIFFERENTIAL/PLATELET
Basophils Absolute: 0 10*3/uL (ref 0.0–0.1)
Basophils Relative: 0.3 % (ref 0.0–3.0)
Eosinophils Absolute: 0 10*3/uL (ref 0.0–0.7)
Eosinophils Relative: 0.5 % (ref 0.0–5.0)
HCT: 40.5 % (ref 36.0–46.0)
Hemoglobin: 13.6 g/dL (ref 12.0–15.0)
Lymphocytes Relative: 19.8 % (ref 12.0–46.0)
Lymphs Abs: 1.6 10*3/uL (ref 0.7–4.0)
MCHC: 33.7 g/dL (ref 30.0–36.0)
MCV: 90 fl (ref 78.0–100.0)
Monocytes Absolute: 0.6 10*3/uL (ref 0.1–1.0)
Monocytes Relative: 7.2 % (ref 3.0–12.0)
Neutro Abs: 5.8 10*3/uL (ref 1.4–7.7)
Neutrophils Relative %: 72.2 % (ref 43.0–77.0)
Platelets: 166 10*3/uL (ref 150.0–400.0)
RBC: 4.5 Mil/uL (ref 3.87–5.11)
RDW: 13.7 % (ref 11.5–14.6)
WBC: 8 10*3/uL (ref 4.5–10.5)

## 2011-01-22 LAB — HEMOGLOBIN A1C: Hgb A1c MFr Bld: 6.3 % (ref 4.6–6.5)

## 2011-01-23 ENCOUNTER — Telehealth (INDEPENDENT_AMBULATORY_CARE_PROVIDER_SITE_OTHER): Payer: Self-pay | Admitting: *Deleted

## 2011-01-26 ENCOUNTER — Ambulatory Visit (INDEPENDENT_AMBULATORY_CARE_PROVIDER_SITE_OTHER): Payer: Managed Care, Other (non HMO) | Admitting: Internal Medicine

## 2011-01-26 ENCOUNTER — Encounter: Payer: Self-pay | Admitting: Internal Medicine

## 2011-01-26 DIAGNOSIS — I10 Essential (primary) hypertension: Secondary | ICD-10-CM

## 2011-01-26 DIAGNOSIS — E785 Hyperlipidemia, unspecified: Secondary | ICD-10-CM

## 2011-01-26 DIAGNOSIS — E8881 Metabolic syndrome: Secondary | ICD-10-CM

## 2011-01-28 NOTE — Progress Notes (Signed)
Summary: High BP  Phone Note Call from Patient Call back at Home Phone [phone]   Caller: Mother Summary of Call: Patient mom called the office noting that the patient BP is 178/120. This patient has an appt on Monday. Patient is wondering if she can take an extra HCTZ. Please adivse. Initial call taken by: Lucious Groves CMA,  January 23, 2011 3:55 PM  Follow-up for Phone Call        Patient aware to take new med, continue to monitor B/P and if it remains high with symptoms (Headaches, nausea, vomitting, blurred vision, or chest pain, ect . . . . .) to go to the hospital right away. Patient agreed  Follow-up by: Shonna Chock CMA,  January 23, 2011 4:41 PM    New/Updated Medications: LOSARTAN POTASSIUM 100 MG TABS (LOSARTAN POTASSIUM) 1/2 by mouth once daily Prescriptions: LOSARTAN POTASSIUM 100 MG TABS (LOSARTAN POTASSIUM) 1/2 by mouth once daily  #30 x 0   Entered by:   Shonna Chock CMA   Authorized by:   Marga Melnick MD   Signed by:   Shonna Chock CMA on 01/23/2011   Method used:   [facility]        [facility]  [phone]* (retail)       [address] [place], [place]  [address]       Ph: [phone] or [phone]       Fax: [phone]   RxID:   [id number]

## 2011-02-03 NOTE — Assessment & Plan Note (Signed)
Summary: med refill/cpx/lch   Vital Signs:  Patient profile:   60 year old female Menstrual status:  hysterectomy Height:      62.5 inches Weight:      204.4 pounds BMI:     36.92 Temp:     98.6 degrees F oral Pulse rate:   64 / minute Resp:     14 per minute BP sitting:   134 / 90  (left arm) Cuff size:   large  Vitals Entered By: Shonna Chock CMA (January 26, 2011 2:20 PM) CC: 1.) CPX, EKG done in 12/2010    2.) B/P concerns , Type 2 diabetes mellitus follow-up Comments 178/120, 173/99, 158/102, 137/90, 166/100, 151/101, 144/99, 129/90   CC:  1.) CPX, EKG done in 12/2010    2.) B/P concerns , and Type 2 diabetes mellitus follow-up.  History of Present Illness:    BP was up to 178/120 on 02/17; BP improved with 1/2 of Losartan 100 mg once daily . She  reports flaring of headaches this weekend, but denies lightheadedness, urinary frequency, edema, and fatigue.  Associated symptoms include  some chest pressure and  intermittent palpitations.  The patient denies the following associated symptoms: chest pain, dyspnea, and syncope.  The patient reports that dietary compliance has been good.  The patient reports no exercise.  Adjunctive measures currently used by the patient include  modified salt restriction.   Labs reviewed & risks discussed.    A1c is in Diabetes range. Role of HFCS sugar in rasising TG &  increasing Diabetes risk was  discussed She  reports blurred vision and numbness of feet occasionally @ night , but denies polydipsia, weight loss, and weight gain.  Other symptoms include intermittent claudication.  The patient denies the following symptoms: neuropathic pain, vomiting, poor wound healing, vision loss, and foot ulcer.    Current Medications (verified): 1)  Vivelle-Dot 0.075 Mg/24hr  Pttw (Estradiol) .... Change Patch 2 Times Q Week 2)  Zomig 5 Mg Tabs (Zolmitriptan) .... Take As Needed For Migraine 3)  Propranolol Hcl Cr 60 Mg Xr24h-Cap (Propranolol Hcl) .Marland Kitchen.. 1 Q 12  Hours For Migraine Prevention & Rapid Heart Rate 4)  Indocin 1 Mg Solr (Indomethacin Sodium) .... As Needed For Migraines 5)  Xanax Xr 0.5 Mg Xr24h-Tab (Alprazolam) .Marland Kitchen.. 1 Tab By Mouth Prn 6)  Nystatin 100000 Unit/gm Crea (Nystatin) .... Apply Twice Daily To Affected Area 7)  Hydrochlorothiazide 12.5 Mg  Tabs (Hydrochlorothiazide) .... Take 1 Tab  By Mouth Every Morning 8)  Pristiq 100 Mg Xr24h-Tab (Desvenlafaxine Succinate) .... Take One Tablet Two Times A Day 9)  Losartan Potassium 100 Mg Tabs (Losartan Potassium) .... 1/2 By Mouth Once Daily  Allergies: 1)  ! Darvon 2)  ! Darvocet 3)  ! * Antihistamines 4)  ! Augmentin 5)  Vicodin  Physical Exam  General:  well-nourished,in no acute distress; alert,appropriate and cooperative throughout examination Neck:  No deformities, masses, or tenderness noted. Lungs:  Normal respiratory effort, chest expands symmetrically. Lungs are clear to auscultation, no crackles or wheezes. Heart:  Normal rate and regular rhythm. S1 and S2 normal without gallop, murmur, click, rub . S4 with slurring Abdomen:  Bowel sounds positive,abdomen soft and non-tender without masses, organomegaly or hernias noted. No AAA or bruits Pulses:  R and L carotid,radial,dorsalis pedis and posterior tibial pulses are full and equal bilaterally Extremities:  No clubbing, cyanosis, edema, or deformity noted. Good nail health Neurologic:  alert & oriented X3 and sensation intact to light  touch over feet.   Skin:  Intact without suspicious lesions or rashes Psych:  memory intact for recent and remote, normally interactive, and good eye contact.     Impression & Recommendations:  Problem # 1:  HYPERTENSION (ICD-401.9)  Her updated medication list for this problem includes:    Propranolol Hcl Cr 60 Mg Xr24h-cap (Propranolol hcl) .Marland Kitchen... 1 q 12 hours for migraine prevention & rapid heart rate    Hydrochlorothiazide 12.5 Mg Tabs (Hydrochlorothiazide) .Marland Kitchen... Take 1 tab  by mouth  every morning    Losartan Potassium 100 Mg Tabs (Losartan potassium) .Marland Kitchen... 1/2 by mouth once daily  Problem # 2:  DYSMETABOLIC SYNDROME X (ICD-277.7) A1c 6.3%  Problem # 3:  HYPERLIPIDEMIA (ICD-272.4)  Complete Medication List: 1)  Vivelle-dot 0.075 Mg/24hr Pttw (Estradiol) .... Change patch 2 times q week 2)  Zomig 5 Mg Tabs (Zolmitriptan) .... Take as needed for migraine 3)  Propranolol Hcl Cr 60 Mg Xr24h-cap (Propranolol hcl) .Marland Kitchen.. 1 q 12 hours for migraine prevention & rapid heart rate 4)  Indocin 1 Mg Solr (Indomethacin sodium) .... As needed for migraines 5)  Xanax Xr 0.5 Mg Xr24h-tab (Alprazolam) .Marland Kitchen.. 1 tab by mouth prn 6)  Nystatin 100000 Unit/gm Crea (Nystatin) .... Apply twice daily to affected area 7)  Hydrochlorothiazide 12.5 Mg Tabs (Hydrochlorothiazide) .... Take 1 tab  by mouth every morning 8)  Pristiq 100 Mg Xr24h-tab (Desvenlafaxine succinate) .... Take one tablet two times a day 9)  Losartan Potassium 100 Mg Tabs (Losartan potassium) .... 1/2 by mouth once daily  Patient Instructions: 1)  Consume LESS THAN 30 grams of HFCS sugar/ day from foods & drinks with HFCS  as #2,3 or #4 on label. 2)  Please schedule a follow-up appointment in 4 months. 3)  Lipid Panel prior to visit, ICD-9:277.7 4)  HbgA1C prior to visit, ICD-9:277.7 5)  Check your Blood Pressure regularly. If it is above: 135/85 ON AVERAGE  you should make an appointment. Prescriptions: LOSARTAN POTASSIUM 100 MG TABS (LOSARTAN POTASSIUM) 1/2 by mouth once daily  #30 x 5   Entered and Authorized by:   Marga Melnick MD   Signed by:   Marga Melnick MD on 01/26/2011   Method used:   Print then Give to Patient   RxID:   [id number]    Orders Added: 1)  Est. Patient Level IV [95621]

## 2011-02-20 LAB — BASIC METABOLIC PANEL
BUN: 7 mg/dL (ref 6–23)
CO2: 27 mEq/L (ref 19–32)
Calcium: 9.3 mg/dL (ref 8.4–10.5)
Chloride: 103 mEq/L (ref 96–112)
Creatinine, Ser: 0.86 mg/dL (ref 0.4–1.2)
GFR calc Af Amer: >60 mL/min (ref >60)
GFR calc non Af Amer: >60 mL/min (ref >60)
Glucose, Bld: 86 mg/dL (ref 70–99)
Potassium: 4.2 mEq/L (ref 3.5–5.1)
Sodium: 139 mEq/L (ref 135–145)

## 2011-02-20 LAB — COMPREHENSIVE METABOLIC PANEL
ALT: 14 U/L (ref 0–35)
AST: 22 U/L (ref 0–37)
Albumin: 3.5 g/dL (ref 3.5–5.2)
Alkaline Phosphatase: 96 U/L (ref 39–117)
BUN: 13 mg/dL (ref 6–23)
CO2: 26 mEq/L (ref 19–32)
Calcium: 8.9 mg/dL (ref 8.4–10.5)
Chloride: 105 mEq/L (ref 96–112)
Creatinine, Ser: 0.9 mg/dL (ref 0.4–1.2)
GFR calc Af Amer: >60 mL/min (ref >60)
GFR calc non Af Amer: >60 mL/min (ref >60)
Glucose, Bld: 120 mg/dL — ABNORMAL HIGH (ref 70–99)
Potassium: 3.4 mEq/L — ABNORMAL LOW (ref 3.5–5.1)
Sodium: 140 mEq/L (ref 135–145)
Total Bilirubin: 0.4 mg/dL (ref 0.3–1.2)
Total Protein: 6.8 g/dL (ref 6.0–8.3)

## 2011-02-20 LAB — GLUCOSE, CAPILLARY
Glucose-Capillary: 134 mg/dL — ABNORMAL HIGH (ref 70–99)
Glucose-Capillary: 148 mg/dL — ABNORMAL HIGH (ref 70–99)
Glucose-Capillary: 161 mg/dL — ABNORMAL HIGH (ref 70–99)
Glucose-Capillary: 172 mg/dL — ABNORMAL HIGH (ref 70–99)
Glucose-Capillary: 186 mg/dL — ABNORMAL HIGH (ref 70–99)
Glucose-Capillary: 221 mg/dL — ABNORMAL HIGH (ref 70–99)
Glucose-Capillary: 223 mg/dL — ABNORMAL HIGH (ref 70–99)
Glucose-Capillary: 225 mg/dL — ABNORMAL HIGH (ref 70–99)
Glucose-Capillary: 264 mg/dL — ABNORMAL HIGH (ref 70–99)
Glucose-Capillary: 91 mg/dL (ref 70–99)

## 2011-02-20 LAB — CARDIAC PANEL(CRET KIN+CKTOT+MB+TROPI)
CK, MB: 1.9 ng/mL (ref 0.3–4.0)
Relative Index: INVALID (ref 0.0–2.5)
Total CK: 65 U/L (ref 7–177)
Troponin I: 0.05 ng/mL (ref 0.00–0.06)

## 2011-02-20 LAB — CBC
HCT: 42.1 % (ref 36.0–46.0)
Hemoglobin: 13.9 g/dL (ref 12.0–15.0)
MCH: 31.1 pg (ref 26.0–34.0)
MCHC: 33 g/dL (ref 30.0–36.0)
MCV: 94.2 fL (ref 78.0–100.0)
Platelets: 197 10*3/uL (ref 150–400)
RBC: 4.47 MIL/uL (ref 3.87–5.11)
RDW: 13.9 % (ref 11.5–15.5)
WBC: 9.7 10*3/uL (ref 4.0–10.5)

## 2011-02-20 LAB — URINALYSIS, ROUTINE W REFLEX MICROSCOPIC
Bilirubin Urine: NEGATIVE
Glucose, UA: NEGATIVE mg/dL
Hgb urine dipstick: NEGATIVE
Ketones, ur: NEGATIVE mg/dL
Nitrite: NEGATIVE
Protein, ur: NEGATIVE mg/dL
Specific Gravity, Urine: 1.006 (ref 1.005–1.030)
Urobilinogen, UA: 0.2 mg/dL (ref 0.0–1.0)
pH: 6.5 (ref 5.0–8.0)

## 2011-02-20 LAB — URINE DRUGS OF ABUSE SCREEN W ALC, ROUTINE (REF LAB)
Amphetamine Screen, Ur: NEGATIVE
Barbiturate Quant, Ur: NEGATIVE
Benzodiazepines.: NEGATIVE
Cocaine Metabolites: NEGATIVE
Creatinine,U: 35.4 mg/dL
Ethyl Alcohol: <10 mg/dL (ref <10)
Marijuana Metabolite: NEGATIVE
Methadone: NEGATIVE
Opiate Screen, Urine: NEGATIVE
Phencyclidine (PCP): NEGATIVE
Propoxyphene: NEGATIVE

## 2011-02-20 LAB — LIPID PANEL
Cholesterol: 228 mg/dL — ABNORMAL HIGH (ref 0–200)
HDL: 65 mg/dL (ref >39)
LDL Cholesterol: 144 mg/dL — ABNORMAL HIGH (ref 0–99)
Total CHOL/HDL Ratio: 3.5 RATIO
Triglycerides: 94 mg/dL (ref <150)
VLDL: 19 mg/dL (ref 0–40)

## 2011-02-20 LAB — POCT HEMOGLOBIN-HEMACUE: Hemoglobin: 13.4 g/dL (ref 12.0–15.0)

## 2011-02-20 LAB — HEMOGLOBIN A1C
Hgb A1c MFr Bld: 5.8 % — ABNORMAL HIGH (ref <5.7)
Mean Plasma Glucose: 120 mg/dL — ABNORMAL HIGH (ref <117)

## 2011-02-20 LAB — APTT: aPTT: 28 seconds (ref 24–37)

## 2011-02-20 LAB — PROTIME-INR
INR: 0.93 (ref 0.00–1.49)
Prothrombin Time: 12.7 seconds (ref 11.6–15.2)

## 2011-03-14 ENCOUNTER — Other Ambulatory Visit: Payer: Self-pay | Admitting: Internal Medicine

## 2011-03-31 ENCOUNTER — Other Ambulatory Visit: Payer: Self-pay | Admitting: Internal Medicine

## 2011-03-31 NOTE — Telephone Encounter (Signed)
Dr.Hopper please advise, this is not on patient's med list (med list from centricity printed and placed on ledge)

## 2011-03-31 NOTE — Telephone Encounter (Signed)
Relpax OR Zomig, not both

## 2011-04-01 NOTE — Telephone Encounter (Signed)
I left message on voicemail informing patient of Dr.Hopper's response and patient to call if any questions or concerns

## 2011-04-20 ENCOUNTER — Other Ambulatory Visit: Payer: Self-pay | Admitting: *Deleted

## 2011-04-20 MED ORDER — HYDROCHLOROTHIAZIDE 12.5 MG PO TABS: 12.5000 mg | ORAL_TABLET | Freq: Every day | ORAL | Status: DC

## 2011-04-21 NOTE — Consult Note (Signed)
NAME:  Ebony Garcia, Ebony Garcia            ACCOUNT NO.:  [id number]   MEDICAL RECORD NO.:  [id number]          PATIENT TYPE:  INP   LOCATION:  4738                         FACILITY:  [facility]   PHYSICIAN:  Melvyn Novas, M.D.  DATE OF BIRTH:  April 05, 1951   DATE OF CONSULTATION:  08/11/2098  DATE OF DISCHARGE:                                 CONSULTATION   REFERRING PHYSICIAN:  Titus Dubin. Alwyn Ren, MD,FACP,FCCP   She is a 60 year old female with chief complaint of headache.   HISTORY OF PRESENT ILLNESS:  This is a 60 year old female with long  history of migraine headache.  This headache started 4 days ago and has  been unrelenting.  She states that she has had one or two bouts of  nausea and vomiting, and the headache is described as a constant  achiness with intermittent tearing.  Patient states no visual or  sensation changes, and no auras, but positive photophobia and  phonophobia.  Timeframe of headache:  Started Monday on which she took  Zomig without help in addition to 3 or 4 Advil on Wednesday, which again  did not significantly reduce her headache.  On Wednesday she decided to  go to [facility] where she was prescribed both Cymbalta and  Abilify.  She states she started the Cymbalta and Abilify within the  last 2 days and was possibly thinking these could have initiated the  headache.  She was explained that most likely these were not the causes;  however, to call Dr. Alwyn Ren, her primary care doctor.  Thursday she  called Dr. Alwyn Ren, her primary care doctor, who after discussing the  problem with her, sent her to [facility] Emergency Room where she was  evaluated, given 2 doses of morphine, which patient states did not help,  and around 1:00 p.m. 5 mg of Imitrex.  Patient states the Imitrex had a  significant and quick effect of decreasing the headache.  Overall  patient states that she has been getting these headaches in the morning  and she has been getting one headache per  week or up to 6 headaches per  week.   PAST MEDICAL HISTORY:  Positive for:  1. Hypertension.  2. Hyperlipidemia.  3. Depression.  4. Migraines.  5. Renal stones.   PAST SURGICAL HISTORY:  1. Hysterectomy.  2. T&A.  3. Tubal ligation.  4 .  D&C x1.   MEDICATIONS:  1. Atenolol 100 mg.  2. Hydrochlorothiazide 12.5 mg daily.  3. Zomig 5 mg p.r.n.  4. Vivelle 5 mg.  5. Ability 5 mg 1/2 tablet daily.  6. Cymbalta 30 mg daily.   ALLERGIES:  1. DARVON.  2. DARVOCET.  3. ANTIHISTAMINES.  4. AUGMENTIN.   REVIEW OF SYSTEMS:  Total of review of systems was thoroughly questioned  with her.  She is positive for weight change, nausea, vomiting,  hypertension, constipation, numbness, headache, joint pain, anxiety and  depression.   PHYSICAL EXAMINATION:  On physical exam, patient had blood pressure of  97/60, heart rate 60, respiratory rate 20 and temperature 97 degrees.  MENTAL STATUS:  She is alert and oriented x3.  She carries out 2 and 3  step commands.  Pupils are equal, round and reactive to light.  Conjugate gaze.  Extraocular motors are intact.  Visual fields intact.  Face is symmetric.  Tongue midline.  Uvula midline.  No slurring of  speech.  No dysarthria or aphasia.  Sensation throughout V1-V3 is full.  Shoulder shrug, head turn is full.  Coordination:  Finger-to-nose, heel-  to-shin is full.  Gait:  Not tested.  Motor:  She is 5/5 globally.  Good  tone, good bulk.  Sensation full globally to pinprick, light touch,  vibration.  Deep tendon reflexes were 2+ throughout and downgoing toes  bilaterally.  PULMONARY:  Clear to auscultation bilaterally.  CARDIOVASCULAR:  Regular rate and rhythm.  No murmurs.  NECK:  No bruits.  Supple.   LABORATORY DATA:  She had negative UA.  Sodium 134, potassium 4.1,  chloride 98, bicarb 28, BUN 13, creatine 1.16, glucose 117.  White blood  cell count 4.5.  Hemoglobin 14.1, hematocrit 42.1 and platelet 126.   ASSESSMENT:   [age]-year-old female with significant history of  migraine headaches today presents with a 3-4 day series of migraine  headaches aborted by Imitrex.   TREATMENT/PLAN:  At this time no treatment needed as she has no  headache.  However, we recommend changing Zomig to Imitrex, and have  followup with Dr. Porfirio Mylar Dohmeier 2 weeks post discharge from hospital  for migraine treatment, for IV Depacon and obstructive sleep apnea  evaluation.     ______________________________  Felicie Morn, PA-C      Melvyn Novas, M.D.  Electronically Signed    DS/MEDQ  D:  08/10/2008  T:  08/11/2008  Job:  [id number]   cc:   Titus Dubin. Alwyn Ren, MD,FACP,FCCP  [phone] [place]  [place] [address]

## 2011-04-21 NOTE — Assessment & Plan Note (Signed)
[facility]                            CARDIOLOGY OFFICE NOTE   NAME:[name], [name]                   MRN:          [id number]  DATE:08/21/2008                            DOB:          1951-01-27    She had a recent MRI of the head with and without contrast.  She had no  evidence of acute ischemia, nonspecific subcortical white matter changes  with subcortical punctate hyperintensities.  This was felt to be either  ischemic gliosis or small vessel disease from hypertension, diabetes  versus demyelinating process or vasculitis.  These findings could also  be seen with chronic vascular headaches.   The other study done on August 15, 2008, as well showed some  attenuated caliber and flow signaling the vertebrobasilar junction.  No  definite arteriosclerosis was seen.     Thomas C. Daleen Squibb, MD, [facility]  Electronically Signed    TCW/MedQ  DD: 08/21/2008  DT: 08/22/2008  Job #: [id number]

## 2011-04-21 NOTE — Assessment & Plan Note (Signed)
[facility]                            CARDIOLOGY OFFICE NOTE   NAME:Climer, DENEANE STIFTER                   MRN:          [id number]  DATE:08/21/2008                            DOB:          1951-09-24    I was asked by Dr. Marga Melnick to consult on Ebony Garcia.  She  has been having some palpitations, some shortness of breath, and some  chest discomfort.   HISTORY OF PRESENT ILLNESS:  She is 60 years of age, married white  female.  Her symptoms have been intermittent off and on for years.  Her  biggest complaint is that of just fatigue and not having any energy.   She specifically denies any exertional chest tightness or pressure  consistent with angina.  Her discomfort is very atypical and  intermittent.  She describes most of her palpitations is being brief and  usually happening at rest.  She has had no presyncope or syncope.  She  denies orthopnea, PND, or peripheral edema.   She struggle with her weight for a number of years.  She weighs 203 and  has for several years.  She used to weigh 140 and was 160 and 174  sometime as well.   PAST MEDICAL HISTORY:  She is intolerant of DARVON and ANTIHISTAMINES.  She has no history of dye allergy.   MEDICATIONS:  1. She is on propranolol 60 mg a day.  2. Cymbalta 30 mg a day.  3. Abilify 2.5 mg a day.  4. She takes gabapentin and Phenergan p.r.n.   SOCIAL HISTORY:  She does not smoke or drink.   She does carry a history of some high blood pressure since 2008.  She  has had chronic migraines almost daily and is disabled from this since  1988 until now.   SURGICAL HISTORY:  She has had a T&A in 1962, deviated septum in 1973,  and a tubal ligation in 1979.   FAMILY HISTORY:  There is no premature history of coronary artery  disease in her family.  Her mother is a patient who has atrial  fibrillation and has had some diastolic heart failure.   SOCIAL HISTORY:  She is a Futures trader.  She is  married.  All of her  children are in the 30s.   REVIEW OF SYSTEMS:  Some seasonal allergies, history of anemia, fatigue,  anxiety, and depression.   PHYSICAL EXAMINATION:  VITAL SIGNS:  Her blood pressure is 135/89 and  her pulse is 67 and regular.  She is 5 feet 2-1/2 and weighs 203 pounds.  HEENT:  Normal.  NECK:  Carotid upstrokes were equal bilaterally without bruits.  No JVD.  Thyroid is not enlarged.  Trachea is midline.  Neck is supple.  LUNGS:  Clear to auscultation and percussion.  HEART:  Reveals a poorly appreciated PMI.  Normal S1 and S2.  No gallop.  ABDOMEN:  Soft.  Good bowel sounds.  No tenderness.  There is no  hepatomegaly.  EXTREMITIES:  There are no cyanosis, clubbing, or edema.  Pulses are  intact.  There is no sign of DVT.  NEUROLOGIC:  Intact.   EKG is completely normal.   ASSESSMENT:  1. Benign tachy palpitations.  2. Dyspnea on exertion, probably secondary to deconditioning and      sedentary lifestyle as well as her weight.  3. Hypertension.  4. Disabling migraine headaches.  5. Atypical chest pain.   PLAN:  A 2D echocardiogram to assess for any structural heart disease,  diastolic noncompliance, pulmonary hypertension, LVH, and LV function.   This is structurally normal.  Reassurance will be given.  I have  strongly urged her to get her weight under control.  This would for both  problems down the road with diabetes and hypertension, being more  difficult to treat, not to mention orthopedic issues.  We specifically  talked about the increased risk of stroke and MI with continued weight  gain.     Thomas C. Daleen Squibb, MD, [facility]  Electronically Signed    TCW/MedQ  DD: 08/21/2008  DT: 08/22/2008  Job #: [id number]   cc:   Titus Dubin. Alwyn Ren, MD,FACP,FCCP

## 2011-04-24 NOTE — Op Note (Signed)
NAME:  Ebony Garcia, Ebony Garcia            ACCOUNT NO.:  [id number]   MEDICAL RECORD NO.:  [id number]          PATIENT TYPE:  OBV   LOCATION:  9318                          FACILITY:  [facility]   PHYSICIAN:  Sherry A. Dickstein, M.D.DATE OF BIRTH:  1951-04-12   DATE OF PROCEDURE:  07/22/2005  DATE OF DISCHARGE:                                 OPERATIVE REPORT   PREOPERATIVE DIAGNOSES:  1.  Fibroid uterus.  2.  Menstrual migraines.   POSTOPERATIVE DIAGNOSES:  1.  Fibroid uterus.  2.  Menstrual migraines.   PROCEDURE:  Laparoscopic supracervical hysterectomy.   SURGEON:  Sherry A. Rosalio Macadamia, M.D.   ANESTHESIA:  General.   ASSISTANT:  [place] B. Earlene Plater, M.D.   INDICATIONS:  This is a 60 year old G3, P3-0-0-3, woman who has irregular  periods every two weeks to two months, lasting anywhere for two to seven  days.  The patient at times has excessively heavy clotting, at times has  just spotting.  The patient has very severe migraines, including menstrual  migraines.  The patient has had known fibroid uterus.  The patient has been  treated by a headache specialist; however, her headaches are much worse  during her periods and does not feel like she has had these under control.  Because of the excessive nature of her menstrual migraines and because of  the very irregular nature of her periods and the presence of her fibroids,  the patient requests hysterectomy with removal of her ovaries.  The patient  understands that removal of her ovaries may not to alleviate her menstrual  migraines; however, she requests surgery to try to get on a steady-state  estrogen dose to try to control these migraines.  The patient does not want  any other kind of surgical intervention than the hysterectomy to control her  irregular and heavy menstrual bleeding.  The patient had an endometrial  biopsy, which was benign.   FINDINGS:  Ten weeks' size irregular uterus, normal fallopian tubes and  ovaries.   PROCEDURE:  The patient was brought into the operating room and given  adequate general anesthesia.  She was placed in dorsal lithotomy position.  Her abdomen and her vagina were washed with Betadine.  The patient was  draped in a sterile fashion.  A Foley catheter was placed in the bladder.  Subumbilical incision was infiltrated with 0.25% Marcaine.  Subumbilical  incision was made.  This was brought down sharply to the fascia.  Fascia was  incised sharply.  Fascial edges were identified and a pursestring stitch was  taken with 0 Vicryl.  Peritoneum was identified and was bluntly entered.  The Hasson trocar was placed within this space and was cinched down with 0  Vicryl sutures.  Carbon dioxide was insufflated in the abdominal cavity and  the laparoscope was introduced.  The pelvis was inspected using 0.25%  Marcaine, lateral areas were infiltrated and incisions were made.  A #11  trocar was inserted on the right side of the abdomen, a #5 mm trocar was  inserted on the left.  These were inserted under direct visualization using  the harmonic scalpel.  Initially the right infundibulopelvic ligament was  identified.  It was initially cauterized.  Because of some bleeding, the  Kleppingers were used for hemostasis.  Then the harmonic scalpel was used  for cautery and for cutting.  After the harmonic scalpel  had been used, a  very small area of the bowel had been touched; however, this was not while  it being actively used and the bowel was just barely blanched by the  harmonic scalpel.  Again, this was not while it was being actively used.  This was watched throughout the case, and there was no active damage on the  bowel.  The infundibulopelvic pelvic ligament had been cauterized and  separated in this fashion.  The mesosalpinx was dissected below the broad  ligament over to the lower portion of the uterus.  The anterior leaf of the  broad ligament was separated, and it was dissected,  cauterized and incised  using the harmonic scalpel.  The uterine arteries were identified.  These  were cauterized on minimum power and then dissected and separated in this  fashion.  There was some minimal bleeding during this period of time.  Prior  to dissecting the uterine arteries, the round ligaments had been cauterized  and dissected free.  There was only minimal bleeding during this period of  time, and attention was then turned to the left side.  The left side was  cauterized in a similar fashion using harmonic scalpel.  The left  infundibulopelvic ligament had some bleeding after being cauterized.  Therefore, the base of the IP was cauterized using the bipolar cautery with  adequate hemostasis present.  The left uterines were well-identified and  cauterized well up on the uterus with adequate hemostasis present.  Once  both uterines were felt to be adequately cauterized and dissected, then the  reverse cone was produced using the harmonic scalpel.  The cervix was  dissected free in this fashion from the body of the uterus.  The endocervix  was cauterized using the harmonic scalpel for at least 20 seconds at maximum  power to minimize any endometrium that could be present and any lower  uterine segment that might have been remaining.  There was adequate  hemostasis across the cervix.  There was a small amount of bleeding at the  right uterosacral ligament junction to the cervix.  This was superficially  cauterized using bipolar cautery.  The ureters had been visualized well  below this area.  Adequate hemostasis was present.  The area was irrigated  well.  The right abdominal trocar was removed and the morcellator was  inserted into this abdominal incision and under direct visualization, the  pieces were morcellated separately.  The left ovary was morcellated first  under direct visualization.  Then the uterus and fibroids were morcellated. The right tube and ovary was morcellated  and was removed in pieces.  Any  small pieces were removed separately.  The pelvis was inspected.  There was  no significant bleeding present.  The IP ligaments were inspected and felt to have adequate hemostasis.  The  cervical stump was felt to have adequate hemostasis.  The abdomen was  irrigated.  An Interceed was placed across the base of the cervix.  All  irrigation was removed.  All carbon dioxide was allowed to escape after  removal of all trocars under direct visualization.  The skin incisions were  then closed with 4-0 Vicryl in subcuticular interrupted stitches.  The  subumbilical incision was  closed first by tying the pursestring stitch of 0  Vicryl that had already  been placed.  All incisions were also closed with  Dermabond.  The patient was then taken out of the dorsal lithotomy position.  She was awakened.  She was moved from the operating table to a stretcher in  stable condition.  Complications were none.  Estimated blood loss 150 mL.      Sherry A. Rosalio Macadamia, M.D.  Electronically Signed     SAD/MEDQ  D:  07/22/2005  T:  07/22/2005  Job:  [id number]

## 2011-04-24 NOTE — Discharge Summary (Signed)
NAME:  Ebony Garcia, Ebony Garcia            ACCOUNT NO.:  [id number]   MEDICAL RECORD NO.:  [id number]          PATIENT TYPE:  INP   LOCATION:  4738                         FACILITY:  [facility]   PHYSICIAN:  Titus Dubin. Hopper, MD,FACP,FCCPDATE OF BIRTH:  06[birth year]1953   DATE OF ADMISSION:  08/09/2008  DATE OF DISCHARGE:  08/11/2008                               DISCHARGE SUMMARY   The patient was admitted August 09, 2008 with diagnoses of  hypotension, flank pain, chills without fever and  intractable migraine.   She was discharged on August 11, 2008 essentially with control or  resolution of all  diagnoses.   On the day of discharge, she had a minimal headache; she had declined  Imitrex.  At admission, she had been found to have hypokalemia with a  potassium of 3.1.  This was corrected and it was 4.2 at the time of  discharge.  She had had nausea and vomiting on 3 occasions prior to  admission, which is probably the etiology of hypokalemia. H&H,Urinalysis  and white count were normal.   She was seen in consult by Dr. Vickey Huger, [facility];ordered  studies included sed rate which was 15 and C-reactive protein which was  5.6.  The MRI was deferred as her migraine which had been intractable  prior to admission had resolved after 1 dose of Imitrex in the hospital.  She had given  a  history of taking Zomig 5 mg on 3 occasions prior to  admission with no benefit.  Additionally, she was found to have a mild  hyperglycemia with a value of 113;  but she had been on a high carb  intake diet.   On the day of discharge, she was afebrile off parenteral antibiotics.  Her oxygen saturations were 96% on room air.  Temperature was 97.8,  respiratory rate 89, and blood pressure 121/59.  She denied any chills,  fever, or sweats.  She denied abdominal pain or any change in her  bowels.  She was having no nausea or vomiting as had been present prior  to admission.  She denied dysuria or hematuria.  The  flank pain had also  resolved.  She had no numbness, tingling, visual disturbances, weakness,  or other neurologic associated symptoms.   On exam, vital signs were as noted.  She was in no distress.  She was  oriented and cooperative.  She had normal respiratory rate with clear  chest.  She had a regular rhythm with no significant murmur or gallop.  Abdomen was nontender.  She had no flank pain to percussion.  Skin was  warm and dry with no lesions.  There was a small bruise over the right  upper extremity at the site of the IV.  Skin was warm and dry.  She was  oriented x3 with no evidence of anxiety.   As noted ,the admitting diagnoses of hypotension, chills, flank pain,  and migraine had all resolved.   At the time of discharge because of the hypotension, atenolol 100 mg  daily was discontinued.  Zomig was also discontinued as she had not  responded to this  prior to admission.  She was to take the sumatriptan  100 mg as needed for migraine; this could be repeated 2 hours later.  She was not to take more than 200 mg in 24 hours.   No other changes were made in her medicines as listed in the chart.  Specifically, she was on the Vivelle dot 0.075 mg 24 hours patch 2 times  a week; Abilify 5 mg one-half daily, and Cymbalta 30 mg daily.   She was to keep a headache diary and monitor for any prodrome.  If a  prodrome appeared, Excedrin Migraine was to be taken immediately.  She  was to followup with Dr. Vickey Huger.  Dr. Vickey Huger will determine whether  MRI is necessary as the migraine is no longer considered intractable.   ,avoiding any food  triggers for headaches.  Her discharge status is  improved.      Titus Dubin. Alwyn Ren, MD,FACP,FCCP  Electronically Signed     [facility]/MEDQ  D:  11/06/2008  T:  11/07/2008  Job:  [id number]

## 2011-04-24 NOTE — Assessment & Plan Note (Signed)
[facility]                                 ON-CALL NOTE   NAME:MONTENEGROChyrel, Taha                   MRN:          [id number]  DATE:12/06/2006                            DOB:          18-Dec-1950    TELEPHONE NUMBER:  [phone].   DOCTOR OF PATIENT:  Dr. Alwyn Ren.   TIME OF CALL:  2:14pm.   Ms. Congo is waiting for a mail in prescription of her Symbyax 6/25  mg. It has not come and she is due to run out. She [phone], so that she does not run out.   PLAN:  I told her that we try not to refill prescriptions on the weekend  and given the nature of the prescription, I definitely do not want her  to run out. I did phone in the 5 with no refill [facility] and I told  her in the future to try to call during working hours.     Karie Schwalbe, MD  Electronically Signed    RIL/MedQ  DD: 11/27/2006  DT: 11/28/2006  Job #: [id number]   cc:   Titus Dubin. Alwyn Ren, MD,FACP,FCCP

## 2011-05-14 ENCOUNTER — Other Ambulatory Visit: Payer: Self-pay | Admitting: Obstetrics

## 2011-05-23 ENCOUNTER — Encounter: Payer: Self-pay | Admitting: Internal Medicine

## 2011-05-27 ENCOUNTER — Encounter: Payer: Self-pay | Admitting: Internal Medicine

## 2011-05-27 ENCOUNTER — Ambulatory Visit (INDEPENDENT_AMBULATORY_CARE_PROVIDER_SITE_OTHER): Payer: Managed Care, Other (non HMO) | Admitting: Internal Medicine

## 2011-05-27 DIAGNOSIS — G43809 Other migraine, not intractable, without status migrainosus: Secondary | ICD-10-CM

## 2011-05-27 DIAGNOSIS — E785 Hyperlipidemia, unspecified: Secondary | ICD-10-CM

## 2011-05-27 DIAGNOSIS — E8881 Metabolic syndrome: Secondary | ICD-10-CM

## 2011-05-27 DIAGNOSIS — I10 Essential (primary) hypertension: Secondary | ICD-10-CM

## 2011-05-27 LAB — HEMOGLOBIN A1C: Hgb A1c MFr Bld: 6.6 % — ABNORMAL HIGH (ref 4.6–6.5)

## 2011-05-27 NOTE — Progress Notes (Signed)
  Subjective:    Patient ID: Ebony Garcia, female    DOB: 08/07/1951, 60 y.o.   MRN: [id number]  HPI  #1 Mental Health Symptoms: Onset:lqte Feb 2012 with legal separation proceedings Anxiety:some but improved Depression:denied;seeing Dr Lance Coon every 6 months Loss of interest (Anhedonia):no Panic attacks:no Insomnia:no Anorexia:good Fatigue:some due to her mother's health surgical  issues  Neurologic signs/symptoms: Headache, numbness and tingling, weakness:only headaches responsive to Relpax, 8 in past 20 days Endocrinologic signs and symptoms: Hoarseness, weight change,  temperature intolerance (except hot flashes), bowel changes, skin/hair,/nail changes:hair loss,blurred vision(Ophth appt pending) Family history of mental health issues, alcoholism or drug abuse:bro PTSD Medications/efficacy:Pristiq Rxed Dr Lance Coon beneficial #2 HYPERTENSION Disease Monitoring: Blood pressure range-controlled  Chest pain, palpitations- occasional palpitations       Dyspnea- no Medications: Compliance- yes  Lightheadedness,Syncope- no    Edema- no  #3DIABETES Disease Monitoring: Blood Sugar ranges-not monitoring sugars  Polyuria/phagia/dipsia- polyuria       Visual problems- blurring Medications: Compliance- no meds as yet  Hypoglycemic symptoms- no   #4 HYPERLIPIDEMIA Disease Monitoring: See symptoms for Hypertension Medications: Compliance- no meds Abd pain, bowel changes- no   Muscle aches- no LDL 98 in 2/12  ROS See HPI above   PMH Smoking Status noted ;never          Review of Systems     Objective:   Physical Exam Gen.: weight excess  but well-nourished; in no acute distress Eyes: Extraocular motion intact; no lid lag or proptosis Neck:  No deformities, thyromegaly, masses, or tenderness noted.   Supple with full range of motion without pain.  Heart: Normal rhythm and rate without significant murmur, gallop, or extra heart sounds Lungs: Chest clear to  auscultation without rales,rales, wheezes Neuro:Deep tendon reflexes are equal and within normal limits; no tremor   All pulses intact without  bruits .No ischemic skin changes.   Skin: Warm and dry without significant lesions or rashes; no onycholysis Psych: Normally communicative and interactive; no abnormal mood or affect clinically.          Assessment & Plan:  #1 migraines #2 situational anxiety,ie pending divorce #3 see Problem List & assessments Plan: see Orders

## 2011-05-27 NOTE — Patient Instructions (Signed)
Use Maxalt samples. Preventive Health Care: Exercise  30-45  minutes a day, 3-4 days a week. Walking is especially valuable in preventing Osteoporosis. Eat a low-fat diet with lots of fruits and vegetables, up to 7-9 servings per day. Avoid obesity; your goal = waist less than 35 inches.  The most common cause of elevated triglycerides is the ingestion of sugar from high fructose corn syrup sources. You should consume less than 40 grams  of sugar per day from foods and drinks with high fructose corn syrup as number 2, 3, or #4 on the label. As TG go up, HDL or good cholesterol goes down. Also uric acid which causes gout will go up.    Marland Kitchen

## 2011-05-27 NOTE — Progress Notes (Signed)
Addended by: Legrand Como on: 05/27/2011 01:55 PM   Modules accepted: Orders

## 2011-05-28 LAB — MICROALBUMIN / CREATININE URINE RATIO
Creatinine,U: 116.8 mg/dL
Microalb Creat Ratio: 1.9 mg/g (ref 0.0–30.0)
Microalb, Ur: 2.2 mg/dL — ABNORMAL HIGH (ref 0.0–1.9)

## 2011-06-23 ENCOUNTER — Other Ambulatory Visit: Payer: Self-pay | Admitting: Internal Medicine

## 2011-06-23 MED ORDER — ZOLMITRIPTAN 5 MG PO TABS: 5.0000 mg | ORAL_TABLET | ORAL | Status: DC | PRN

## 2011-06-23 NOTE — Telephone Encounter (Signed)
Left message on voicemail for patient to call to discuss refill request, patient was given samples of Maxalt at last OV. Dr.Hopper indicated patient should take one or the other

## 2011-06-23 NOTE — Telephone Encounter (Signed)
Patient wants refill zomig  for migrane - [facility] [address]

## 2011-06-23 NOTE — Telephone Encounter (Signed)
Patient called back and indicated that she will continue with the Zomig (although the maxalt did help)

## 2011-07-07 ENCOUNTER — Other Ambulatory Visit: Payer: Self-pay | Admitting: Internal Medicine

## 2011-07-07 MED ORDER — PROPRANOLOL HCL ER 60 MG PO CP24: 60.0000 mg | ORAL_CAPSULE | Freq: Every day | ORAL | Status: DC

## 2011-07-07 NOTE — Telephone Encounter (Signed)
done

## 2011-07-24 ENCOUNTER — Other Ambulatory Visit: Payer: Self-pay | Admitting: Internal Medicine

## 2011-07-24 NOTE — Telephone Encounter (Signed)
Called the pharmacy to verify rx received on 07/07/11 #60/5refills , rx was on file at pharmacy (sent in error)

## 2011-07-26 ENCOUNTER — Other Ambulatory Visit: Payer: Self-pay | Admitting: Internal Medicine

## 2011-08-16 ENCOUNTER — Other Ambulatory Visit: Payer: Self-pay | Admitting: Family Medicine

## 2011-09-08 ENCOUNTER — Ambulatory Visit (INDEPENDENT_AMBULATORY_CARE_PROVIDER_SITE_OTHER): Payer: Medicare Other | Admitting: Internal Medicine

## 2011-09-08 VITALS — BP 132/70 | HR 87 | Temp 98.7°F | Wt 199.2 lb

## 2011-09-08 DIAGNOSIS — H01119 Allergic dermatitis of unspecified eye, unspecified eyelid: Secondary | ICD-10-CM

## 2011-09-08 NOTE — Patient Instructions (Addendum)
Mix Eucerin 1 part to Cort Aid 1 part and apply  Twice a day to involved  area of skin as needed .   Please avoid cosmetics to this area as much as possible. Use hypoallergenic products. Report the warning signs as discussed.

## 2011-09-08 NOTE — Progress Notes (Signed)
  Subjective:    Patient ID: Ebony Garcia, female    DOB: 1951-08-04, 60 y.o.   MRN: [id number]  HPI RASH: Location: eyelids bilaterally  Onset: 6 days ago   Course: stable Self-treated with: moisturizing lotion              Improvement with treatment: no History Pruritis: yes, minor  Tenderness: yes, slightly lower lids  New medications/antibiotics: no  Tick/insect/pet exposure: no  New detergent,cosmetics, new clothing, or other topical exposure: no   Red Flags Feeling ill: no  Fever: no  Mouth lesions: no  Facial/tongue swelling/difficulty breathing:  no  Diabetic or immunocompromised: no        Review of Systems The major and minor symptoms of rhinosinusitis were reviewed. She denies nasal congestion/obstruction; nasal purulence; facial pain;  fatigue; fever; headache; halitosis; earache and dental pain. Anosmia is a chronic issue over the years    Objective:   Physical Exam General appearance is of good health and nourishment; no acute distress or increased work of breathing is present.  No  lymphadenopathy about the head, neck, or axilla noted.   Eye - Pupils Equal Round Reactive to light, Extraocular movements intact, Conjunctiva without redness or discharge . Vision is normal reading the wall chart. Ears:  External ear exam shows no significant lesions or deformities.  Otoscopic examination reveals clear canals, tympanic membranes are intact bilaterally without bulging, retraction, inflammation or discharge.  Nose:  External nasal examination shows no deformity or inflammation. Nasal mucosa are pink and moist without lesions or exudates. No septal dislocation or dislocation.No obstruction to airflow.   Oral exam: Dental hygiene is good; lips and gums are healthy appearing.There is no oropharyngeal erythema or exudate noted.   Neck:  No deformities, thyromegaly, masses, or tenderness noted.    Heart:  Normal rate and regular rhythm. S1 and S2 normal without  gallop, murmur, click, rub or other extra sounds.   Lungs:Chest clear to auscultation; no wheezes, rhonchi,rales ,or rubs present.No increased work of breathing.    Extremities:  No cyanosis, edema, or clubbing  noted    Skin: Warm & dry ; she has very faint irregular erythematous changes over both upper and lower eyelids.          Assessment & Plan:  #1 contact dermatitis suggestive; she doesn't know the manifestations to suggest rosacea. There is no evidence of conjunctivitis.  Plan: See orders and recommendations

## 2011-09-09 LAB — DIFFERENTIAL
Basophils Absolute: 0
Basophils Relative: 0
Eosinophils Absolute: 0
Eosinophils Relative: 1
Lymphocytes Relative: 36
Lymphs Abs: 1.6
Monocytes Absolute: 0.4
Monocytes Relative: 10
Neutro Abs: 2.4
Neutrophils Relative %: 53

## 2011-09-09 LAB — CBC
HCT: 42.1
Hemoglobin: 14.1
MCHC: 33.5
MCV: 91.9
Platelets: 126 — ABNORMAL LOW
RBC: 4.58
RDW: 12.7
WBC: 4.5

## 2011-09-09 LAB — URINALYSIS, ROUTINE W REFLEX MICROSCOPIC
Bilirubin Urine: NEGATIVE
Glucose, UA: NEGATIVE
Hgb urine dipstick: NEGATIVE
Ketones, ur: 15 — AB
Nitrite: NEGATIVE
Protein, ur: NEGATIVE
Specific Gravity, Urine: 1.027
Urobilinogen, UA: 1
pH: 5.5

## 2011-09-09 LAB — BASIC METABOLIC PANEL
BUN: 13
BUN: 6
CO2: 27
CO2: 28
Calcium: 8.3 — ABNORMAL LOW
Calcium: 8.8
Chloride: 108
Chloride: 98
Creatinine, Ser: 0.77
Creatinine, Ser: 1.16
GFR calc Af Amer: 59 — ABNORMAL LOW
GFR calc Af Amer: >60
GFR calc non Af Amer: 49 — ABNORMAL LOW
GFR calc non Af Amer: >60
Glucose, Bld: 113 — ABNORMAL HIGH
Glucose, Bld: 117 — ABNORMAL HIGH
Potassium: 3.1 — ABNORMAL LOW
Potassium: 4.2
Sodium: 134 — ABNORMAL LOW
Sodium: 140

## 2011-09-09 LAB — URINE CULTURE
Colony Count: NO GROWTH
Culture: NO GROWTH

## 2011-09-09 LAB — CULTURE, BLOOD (ROUTINE X 2)
Culture: NO GROWTH
Culture: NO GROWTH

## 2011-09-09 LAB — C-REACTIVE PROTEIN: CRP: 5.6 — ABNORMAL HIGH (ref <0.6)

## 2011-09-09 LAB — SEDIMENTATION RATE: Sed Rate: 15

## 2011-09-15 ENCOUNTER — Other Ambulatory Visit: Payer: Self-pay | Admitting: Internal Medicine

## 2011-09-15 MED ORDER — ZOLMITRIPTAN 5 MG PO TABS: ORAL_TABLET | ORAL | Status: DC

## 2011-09-15 NOTE — Telephone Encounter (Signed)
RX sent

## 2011-10-23 ENCOUNTER — Other Ambulatory Visit: Payer: Self-pay | Admitting: Internal Medicine

## 2011-11-29 ENCOUNTER — Ambulatory Visit (INDEPENDENT_AMBULATORY_CARE_PROVIDER_SITE_OTHER): Payer: 59

## 2011-11-29 DIAGNOSIS — Z23 Encounter for immunization: Secondary | ICD-10-CM

## 2012-01-15 ENCOUNTER — Other Ambulatory Visit: Payer: Self-pay | Admitting: Internal Medicine

## 2012-01-15 NOTE — Telephone Encounter (Signed)
Dr.Hopper please advise if ok to fille, Drug interaction info printed off and placed on ledge for review

## 2012-01-17 NOTE — Telephone Encounter (Signed)
See my remarks concerning tome interval required between the 2 meds as the tryptan  could increase the relative dose of the serotonin reuptake inhibitor as they would both be metabolized through the liver.

## 2012-01-18 ENCOUNTER — Telehealth: Payer: Self-pay | Admitting: Internal Medicine

## 2012-01-18 NOTE — Telephone Encounter (Signed)
She should discuss alternatives to this medicine with her pharmacist. The price is set by her insurance company; the pharmacist should know  the cheapest alternative on her plan. Other option is  mail-order through [place]

## 2012-01-18 NOTE — Telephone Encounter (Signed)
Dr.Hopper please advise on an alternative to Zomig, med is too expensive and we do not have samples. (Patient's husband stopped by), Ok to leave information on machine for patient's husband [phone] when alternative [facility] [address]

## 2012-01-18 NOTE — Telephone Encounter (Signed)
[facility] Triage Call Report Triage Record Num: [id number] Operator: Baldomero Lamy Patient Name: Starlena Congo Call Date & Time: 01/18/2012 9:38:13AM Patient Phone: [phone] PCP: Marga Melnick Patient Gender: Female PCP Fax : [phone] Patient DOB: [birth year] Practice Name: [facility] Reason for Call: Caller: Sarayah/Patient is calling with a question about Pain Med for Migraine #[phone] PT INQUIRING IF DR. HOPPER COULD GIVE PT SAMPLES OF ANYTHING AVAILABLE IN OFFICE FOR TO TX MIGRAINES--TREXAMET OR ANY OTHER SAMPLE. Pt calling stating she has had a migraine-onset 2/9 with no relief. Pt states she can not afford Zomeg ($70 a pill) and has been taking her Phergan and her mother's Hydorcodone for migraine relief. Pt states she can not come in b/c she is caring for her mother after knee replacement and Husband can come pick up samples. Protocol(s) Used: Information Only Call; No Symptom Triage (Adult) Recommended Outcome per Protocol: Call Provider When Office is Open Reason for Outcome: Requesting information and provider is best resource; no triage required. Care Advice:

## 2012-01-18 NOTE — Telephone Encounter (Signed)
[facility] Triage Call Report Triage Record Num: [id number] Operator: Frederico Hamman Patient Name: Ebony Garcia Call Date & Time: 01/17/2012 2:44:01PM Patient Phone: [phone] PCP: Patient Gender: Female PCP Fax : Patient DOB: [birth year] Practice Name: [facility] Reason for Call: Caller: Ebony Garcia/Patient; PCP: Marga Melnick; CB#: [phone]; Call regarding Migraine; Ebony Garcia states she had onset of migraine on 0400 on 01/14/12. States hormone patch fell off and has not been on for several days. Pt thinks this may have contributed to HA. c/o neck pain. Per headache protocol advised to be seen in ED due to increased light sensitivity. Declined ED. Requesting prescription for Phenergan. Per Adult standing orders [facility] [phone] for Phenergan 25 mg tabs one tab po q4h prn. Dispense 6 tabs with no refills. Pt states she will call office in AM. Care advice given. Protocol(s) Used: Headache Recommended Outcome per Protocol: See ED Immediately Reason for Outcome: Sudden loss or change in vision (double or blurred vision, increased light sensitivity, partial loss of visual field) AND not previously evaluated New onset or worsening fever, generalized headache, stiff neck Care Advice: ~ Another adult should drive. ~ Call EMS [phone] if new or worsening drowsiness/difficulty awakening, confusion, or seizure.

## 2012-01-19 NOTE — Telephone Encounter (Signed)
Spoke with patient's husband, he will contact insurance company and call us back with alternative.

## 2012-01-21 ENCOUNTER — Telehealth: Payer: Self-pay | Admitting: *Deleted

## 2012-01-21 NOTE — Telephone Encounter (Signed)
See other note

## 2012-01-21 NOTE — Telephone Encounter (Signed)
Patient returned phone call. °

## 2012-01-21 NOTE — Telephone Encounter (Signed)
Discuss with patient  

## 2012-01-21 NOTE — Telephone Encounter (Signed)
Message copied by Verdene Rio on Thu Jan 21, 2012 11:10 AM ------      Message from: Pecola Lawless      Created: Sat Jan 16, 2012 10:51 AM       The Pristiq should not be taken within 18 hrs of taking  the tryptan (Zomig )   for migraines . The tryptan  could increase the relative dose of the Pristiq, as they would both be metabolized through the liver.

## 2012-01-21 NOTE — Telephone Encounter (Signed)
Pt would like to know if there is something else she can take for her migraine beside Zomig that would be cheaper.Please advise    [facility]

## 2012-01-21 NOTE — Telephone Encounter (Signed)
Per Dr hopper   The Pristiq should not be taken within 18 hrs of taking the tryptan (Zomig ) for migraines . The tryptan could increase the relative dose of the Pristiq, as they would both be metabolized through the liver.   Left message to call office

## 2012-01-21 NOTE — Telephone Encounter (Signed)
She should discuss alternatives to this medicine with her pharmacist. The price is set by her insurance company; the pharmacist should know  the cheapest alternative on her plan.

## 2012-01-25 ENCOUNTER — Other Ambulatory Visit: Payer: Self-pay | Admitting: Internal Medicine

## 2012-01-25 NOTE — Telephone Encounter (Signed)
Left message on voicemail for patient to return call when available, reason for call (Not left on VM) patient requesting refill for Phenergan, we do not have med on list-? Who rx'ed.

## 2012-01-26 NOTE — Telephone Encounter (Signed)
Patient not sure who originally rx'ed med, patient states she takes to help with associated symptoms or nausea when she has migraines. Patient would like to know if Dr.Hopper will rx  Dr.Hopper please advise

## 2012-01-26 NOTE — Telephone Encounter (Signed)
#  6 one half to one every 6-8 hours as needed only; prolonged use could adversely affect liver function

## 2012-03-29 ENCOUNTER — Other Ambulatory Visit: Payer: Self-pay | Admitting: Internal Medicine

## 2012-03-29 ENCOUNTER — Telehealth: Payer: Self-pay | Admitting: Internal Medicine

## 2012-03-29 NOTE — Telephone Encounter (Signed)
Patient called ans states she has lost a-lot of weight and would like to be re-tested to see if she still is a diabetic, without seeing the dr., Please review chart & advise if I need to make patient an OV or advise is labs only are OK? Patient PH# [phone]

## 2012-03-30 NOTE — Telephone Encounter (Signed)
Error

## 2012-04-15 ENCOUNTER — Telehealth: Payer: Self-pay | Admitting: Internal Medicine

## 2012-04-15 NOTE — Telephone Encounter (Signed)
Scheduled patient for Monday 10:15am

## 2012-04-15 NOTE — Telephone Encounter (Signed)
Lmovm for pt to return call.  

## 2012-04-15 NOTE — Telephone Encounter (Signed)
Urine microalbumin and A1c;  Code:790.29

## 2012-04-15 NOTE — Telephone Encounter (Signed)
Requesting lab work to test for DM, can you review the chart & advise if this is ok & put in orders. I can call patient back ph# [phone]

## 2012-04-18 ENCOUNTER — Other Ambulatory Visit (INDEPENDENT_AMBULATORY_CARE_PROVIDER_SITE_OTHER): Payer: Medicare Other

## 2012-04-18 DIAGNOSIS — R7309 Other abnormal glucose: Secondary | ICD-10-CM | POA: Diagnosis not present

## 2012-04-18 DIAGNOSIS — I10 Essential (primary) hypertension: Secondary | ICD-10-CM | POA: Diagnosis not present

## 2012-04-18 DIAGNOSIS — Z0000 Encounter for general adult medical examination without abnormal findings: Secondary | ICD-10-CM

## 2012-04-18 DIAGNOSIS — E785 Hyperlipidemia, unspecified: Secondary | ICD-10-CM

## 2012-04-18 LAB — CBC WITH DIFFERENTIAL/PLATELET
Basophils Absolute: 0 10*3/uL (ref 0.0–0.1)
Basophils Relative: 0.4 % (ref 0.0–3.0)
Eosinophils Absolute: 0.2 10*3/uL (ref 0.0–0.7)
Eosinophils Relative: 1.8 % (ref 0.0–5.0)
HCT: 43 % (ref 36.0–46.0)
Hemoglobin: 14.1 g/dL (ref 12.0–15.0)
Lymphocytes Relative: 17.2 % (ref 12.0–46.0)
Lymphs Abs: 1.6 10*3/uL (ref 0.7–4.0)
MCHC: 32.9 g/dL (ref 30.0–36.0)
MCV: 92.3 fl (ref 78.0–100.0)
Monocytes Absolute: 0.6 10*3/uL (ref 0.1–1.0)
Monocytes Relative: 6.7 % (ref 3.0–12.0)
Neutro Abs: 6.8 10*3/uL (ref 1.4–7.7)
Neutrophils Relative %: 73.9 % (ref 43.0–77.0)
Platelets: 182 10*3/uL (ref 150.0–400.0)
RBC: 4.66 Mil/uL (ref 3.87–5.11)
RDW: 13.5 % (ref 11.5–14.6)
WBC: 9.2 10*3/uL (ref 4.5–10.5)

## 2012-04-18 LAB — LIPID PANEL
Cholesterol: 179 mg/dL (ref 0–200)
HDL: 46.9 mg/dL (ref >39.00)
LDL Cholesterol: 92 mg/dL (ref 0–99)
Total CHOL/HDL Ratio: 4
Triglycerides: 200 mg/dL — ABNORMAL HIGH (ref 0.0–149.0)
VLDL: 40 mg/dL (ref 0.0–40.0)

## 2012-04-18 LAB — BASIC METABOLIC PANEL
BUN: 15 mg/dL (ref 6–23)
CO2: 23 mEq/L (ref 19–32)
Calcium: 8.5 mg/dL (ref 8.4–10.5)
Chloride: 99 mEq/L (ref 96–112)
Creatinine, Ser: 0.8 mg/dL (ref 0.4–1.2)
GFR: 77.04 mL/min (ref >60.00)
Glucose, Bld: 115 mg/dL — ABNORMAL HIGH (ref 70–99)
Potassium: 3.4 mEq/L — ABNORMAL LOW (ref 3.5–5.1)
Sodium: 137 mEq/L (ref 135–145)

## 2012-04-18 LAB — MICROALBUMIN / CREATININE URINE RATIO
Creatinine,U: 213.3 mg/dL
Microalb Creat Ratio: 0.3 mg/g (ref 0.0–30.0)
Microalb, Ur: 0.7 mg/dL (ref 0.0–1.9)

## 2012-04-18 LAB — HEPATIC FUNCTION PANEL
ALT: 14 U/L (ref 0–35)
AST: 16 U/L (ref 0–37)
Albumin: 4 g/dL (ref 3.5–5.2)
Alkaline Phosphatase: 66 U/L (ref 39–117)
Bilirubin, Direct: 0.1 mg/dL (ref 0.0–0.3)
Total Bilirubin: 0.5 mg/dL (ref 0.3–1.2)
Total Protein: 7.4 g/dL (ref 6.0–8.3)

## 2012-04-18 LAB — TSH: TSH: 4.44 u[IU]/mL (ref 0.35–5.50)

## 2012-04-18 NOTE — Progress Notes (Signed)
Labs only

## 2012-04-19 LAB — HEMOGLOBIN A1C: Hgb A1c MFr Bld: 5.9 % (ref 4.6–6.5)

## 2012-04-26 ENCOUNTER — Ambulatory Visit (INDEPENDENT_AMBULATORY_CARE_PROVIDER_SITE_OTHER): Payer: Medicare Other | Admitting: Family Medicine

## 2012-04-26 ENCOUNTER — Encounter: Payer: Self-pay | Admitting: Family Medicine

## 2012-04-26 VITALS — BP 112/72 | HR 54 | Temp 98.3°F | Wt 183.4 lb

## 2012-04-26 DIAGNOSIS — J329 Chronic sinusitis, unspecified: Secondary | ICD-10-CM

## 2012-04-26 DIAGNOSIS — R059 Cough, unspecified: Secondary | ICD-10-CM | POA: Diagnosis not present

## 2012-04-26 DIAGNOSIS — R05 Cough: Secondary | ICD-10-CM

## 2012-04-26 MED ORDER — AZELASTINE-FLUTICASONE 137-50 MCG/ACT NA SUSP: 1.0000 | Freq: Two times a day (BID) | NASAL | Status: DC

## 2012-04-26 MED ORDER — CLARITHROMYCIN ER 500 MG PO TB24: 1000.0000 mg | ORAL_TABLET | Freq: Every day | ORAL | Status: AC

## 2012-04-26 MED ORDER — GUAIFENESIN-CODEINE 100-10 MG/5ML PO SYRP: ORAL_SOLUTION | ORAL | Status: DC

## 2012-04-26 NOTE — Patient Instructions (Signed)

## 2012-04-26 NOTE — Progress Notes (Signed)
  Subjective:     Ebony Garcia is a 61 y.o. female who presents for evaluation of sinus pain. Symptoms include: congestion, cough, facial pain, headaches, nasal congestion, purulent rhinorrhea, sinus pressure and sneezing. Onset of symptoms was 9 days ago. Symptoms have been gradually worsening since that time. Past history is significant for no history of pneumonia or bronchitis. Patient is a non-smoker.  The following portions of the patient's history were reviewed and updated as appropriate: allergies, current medications, past family history, past medical history, past social history, past surgical history and problem list.  Review of Systems Pertinent items are noted in HPI.   Objective:    BP 112/72  Pulse 54  Temp(Src) 98.3 F (36.8 C) (Oral)  Wt 183 lb 6.4 oz (83.19 kg)  SpO2 96% General appearance: alert, cooperative, appears stated age and no distress Ears: l tm slightly red Nose: green discharge, moderate congestion, turbinates red, swollen, sinus tenderness bilateral Throat: lips, mucosa, and tongue normal; teeth and gums normal Neck: mild anterior cervical adenopathy, supple, symmetrical, trachea midline and thyroid not enlarged, symmetric, no tenderness/mass/nodules Lungs: clear to auscultation bilaterally Heart: S1, S2 normal Lymph nodes: Cervical adenopathy: mild cervical adenopathy    Assessment:    Acute bacterial sinusitis.    Plan:    Nasal steroids per medication orders. Antihistamines per medication orders. Biaxin per medication orders. dymista  F/u prn

## 2012-05-10 DIAGNOSIS — N951 Menopausal and female climacteric states: Secondary | ICD-10-CM | POA: Diagnosis not present

## 2012-05-10 DIAGNOSIS — N899 Noninflammatory disorder of vagina, unspecified: Secondary | ICD-10-CM | POA: Diagnosis not present

## 2012-05-10 DIAGNOSIS — A6000 Herpesviral infection of urogenital system, unspecified: Secondary | ICD-10-CM | POA: Diagnosis not present

## 2012-05-10 DIAGNOSIS — Z113 Encounter for screening for infections with a predominantly sexual mode of transmission: Secondary | ICD-10-CM | POA: Diagnosis not present

## 2012-05-12 ENCOUNTER — Telehealth: Payer: Self-pay | Admitting: Internal Medicine

## 2012-05-12 DIAGNOSIS — R059 Cough, unspecified: Secondary | ICD-10-CM

## 2012-05-12 DIAGNOSIS — R05 Cough: Secondary | ICD-10-CM

## 2012-05-12 NOTE — Telephone Encounter (Signed)
120cc  

## 2012-05-12 NOTE — Telephone Encounter (Signed)
Refill: Cheratussin ac syrup. Take 1 to 2 teaspoonful by mouth as bedtime as needed.

## 2012-05-12 NOTE — Telephone Encounter (Signed)
Dr.Hopper please advise 

## 2012-05-13 MED ORDER — GUAIFENESIN-CODEINE 100-10 MG/5ML PO SYRP: ORAL_SOLUTION | ORAL | Status: DC

## 2012-05-13 NOTE — Telephone Encounter (Signed)
RX sent

## 2012-05-23 ENCOUNTER — Emergency Department (HOSPITAL_COMMUNITY)
Admission: EM | Admit: 2012-05-23 | Discharge: 2012-05-24 | Disposition: A | Discharge status: Home / Self Care | Payer: Managed Care, Other (non HMO) | Attending: Emergency Medicine | Admitting: Emergency Medicine

## 2012-05-23 ENCOUNTER — Encounter (HOSPITAL_COMMUNITY): Payer: Self-pay | Admitting: *Deleted

## 2012-05-23 DIAGNOSIS — Z79899 Other long term (current) drug therapy: Secondary | ICD-10-CM | POA: Insufficient documentation

## 2012-05-23 DIAGNOSIS — G43909 Migraine, unspecified, not intractable, without status migrainosus: Secondary | ICD-10-CM | POA: Diagnosis not present

## 2012-05-23 DIAGNOSIS — I10 Essential (primary) hypertension: Secondary | ICD-10-CM | POA: Insufficient documentation

## 2012-05-23 HISTORY — DX: Essential (primary) hypertension: I10

## 2012-05-23 HISTORY — DX: Migraine, unspecified, not intractable, without status migrainosus: G43.909

## 2012-05-23 NOTE — ED Notes (Signed)
Pt reports migraine HA that began this a.m. - pt states she is on disability d/t migraines - states she has taken vicodin and phenergan at home w/o relief. Pt w/ photophobia and nausea.

## 2012-05-24 DIAGNOSIS — I10 Essential (primary) hypertension: Secondary | ICD-10-CM | POA: Diagnosis not present

## 2012-05-24 DIAGNOSIS — G43909 Migraine, unspecified, not intractable, without status migrainosus: Secondary | ICD-10-CM | POA: Diagnosis not present

## 2012-05-24 DIAGNOSIS — Z79899 Other long term (current) drug therapy: Secondary | ICD-10-CM | POA: Diagnosis not present

## 2012-05-24 MED ORDER — DIPHENHYDRAMINE HCL 50 MG/ML IJ SOLN
25.0000 mg | Freq: Once | INTRAMUSCULAR | Status: AC
  Administered 2012-05-24: 25 mg via INTRAMUSCULAR
  Filled 2012-05-24: qty 1

## 2012-05-24 MED ORDER — DEXAMETHASONE SODIUM PHOSPHATE 10 MG/ML IJ SOLN
10.0000 mg | Freq: Once | INTRAMUSCULAR | Status: AC
  Administered 2012-05-24: 10 mg via INTRAMUSCULAR
  Filled 2012-05-24: qty 1

## 2012-05-24 MED ORDER — HYDROCODONE-ACETAMINOPHEN 5-500 MG PO TABS: 1.0000 | ORAL_TABLET | Freq: Four times a day (QID) | ORAL | Status: AC | PRN

## 2012-05-24 MED ORDER — KETOROLAC TROMETHAMINE 30 MG/ML IJ SOLN: 30.0000 mg | Freq: Once | INTRAMUSCULAR | Status: DC

## 2012-05-24 MED ORDER — KETOROLAC TROMETHAMINE 30 MG/ML IJ SOLN
30.0000 mg | Freq: Once | INTRAMUSCULAR | Status: AC
  Administered 2012-05-24: 30 mg via INTRAMUSCULAR
  Filled 2012-05-24: qty 1

## 2012-05-24 MED ORDER — DIPHENHYDRAMINE HCL 50 MG/ML IJ SOLN: 25.0000 mg | Freq: Once | INTRAMUSCULAR | Status: DC

## 2012-05-24 MED ORDER — HYDROCODONE-ACETAMINOPHEN 5-500 MG PO TABS: 1.0000 | ORAL_TABLET | Freq: Four times a day (QID) | ORAL | Status: DC | PRN

## 2012-05-24 MED ORDER — ZOLMITRIPTAN 5 MG PO TABS: 5.0000 mg | ORAL_TABLET | ORAL | Status: DC | PRN

## 2012-05-24 MED ORDER — DEXAMETHASONE SODIUM PHOSPHATE 10 MG/ML IJ SOLN: 10.0000 mg | Freq: Once | INTRAMUSCULAR | Status: DC

## 2012-05-24 NOTE — ED Provider Notes (Signed)
Medical screening examination/treatment/procedure(s) were conducted as a shared visit with non-physician practitioner(s) and myself.  I personally evaluated the patient during the encounter    Hanley Seamen, MD 05/24/12 [phone]

## 2012-05-24 NOTE — ED Provider Notes (Signed)
History     CSN: [id number]  Arrival date & time 05/23/12  2121   First MD Initiated Contact with Patient 05/24/12 0035      Chief Complaint  Patient presents with  . Migraine    (Consider location/radiation/quality/duration/timing/severity/associated sxs/prior treatment) HPI  Patient presents to the Ed with complanits of migraine headache that begain 18 hours ago. She is on chronic disability for her migraine headaches. They are bitemporal. She denies this headache being different from any headache she has had. She admits that she is out of her migraine medications and requests refills. She states that the light makes her headache worse, she is hemodynamically stable and requests to get some meds and then be discharged as she has somewhere to go. No neurological symptoms of weakness, N/V/D. No  Fevers difficulty ambulating.  Past Medical History  Diagnosis Date  . Migraines   . Hypertension     Past Surgical History  Procedure Date  . Abdominal hysterectomy     BSO for fibroids and migraines  . Tonsillectomy and adenoidectomy   . Nasal septum surgery   . Tubal ligation   . Dilation and curettage of uterus     Family History  Problem Relation Age of Onset  . Crohn's disease Mother   . Atrial fibrillation Mother   . Cancer Father     bladder cancer and prostate cancer  . Bipolar disorder Sister   . Bipolar disorder Brother   . Heart attack Maternal Grandmother     History  Substance Use Topics  . Smoking status: Never Smoker   . Smokeless tobacco: Not on file  . Alcohol Use: Yes    OB History    Grav Para Term Preterm Abortions TAB SAB Ect Mult Living                  Review of Systems   HEENT: denies blurry vision or change in hearing PULMONARY: Denies difficulty breathing and SOB CARDIAC: denies chest pain or heart palpitations MUSCULOSKELETAL:  denies being unable to ambulate ABDOMEN AL: denies abdominal pain GU: denies loss of bowel or urinary  control NEURO: denies numbness and tingling in extremities SKIN: no new rashes PSYCH: patient behavior is normal NECK: Not complaining of neck pain    Allergies  Amoxicillin-pot clavulanate; Hydrocodone-acetaminophen; Propoxyphene hcl; Propoxyphene-acetaminophen; and Sulfa antibiotics  Home Medications   Current Outpatient Rx  Name Route Sig Dispense Refill  . ACYCLOVIR 200 MG PO CAPS Oral Take 200 mg by mouth 2 (two) times daily.    Marland Kitchen ALPRAZOLAM ER 0.5 MG PO TB24 Oral Take 0.5 mg by mouth every morning.      Marland Kitchen ESTRADIOL 0.075 MG/24HR TD PTTW Transdermal Place 1 patch onto the skin 2 (two) times a week.      Marland Kitchen FLUOXETINE HCL 20 MG PO CAPS Oral Take 20 mg by mouth daily.    Marland Kitchen HYDROCHLOROTHIAZIDE 12.5 MG PO CAPS  TAKE ONE CAPSULE BY MOUTH EVERY DAY 30 capsule 5  . HYDROCODONE-ACETAMINOPHEN 2.5-500 MG PO TABS Oral Take 1 tablet by mouth every 6 (six) hours as needed. Pain    . LOSARTAN POTASSIUM 100 MG PO TABS  TAKE 1/2 TABLET EVERY DAY 45 tablet 1  . NYSTATIN 100000 UNIT/GM EX CREA Topical Apply topically 2 (two) times daily.      Marland Kitchen PROMETHAZINE HCL 25 MG PO TABS  TAKE 1/2 TO 1 TABLET BY MOUTH EVERY 6 TO 8 HOURS AS NEEDED ONLY 6 tablet 0  . PROPRANOLOL  HCL ER 60 MG PO CP24  TAKE ONE CAPSULE EVERY 12 HOURS FOR MIGRAINE PREVENTION AND RAPID HEART RATE 60 capsule 5  . RELPAX 40 MG PO TABS  TAKE 1 TABLET EVERY 24 HOURS ONSET OF MIGARAINE 12 tablet 1  . HYDROCODONE-ACETAMINOPHEN 5-500 MG PO TABS Oral Take 1 tablet by mouth every 6 (six) hours as needed for pain. 15 tablet 0  . ZOLMITRIPTAN 5 MG PO TABS Oral Take 1 tablet (5 mg total) by mouth as needed for migraine. 8 tablet 2    The Pristiq should not be taken within 18 hrs of t ...    BP 103/65  Pulse 51  Temp 98 F (36.7 C) (Oral)  Resp 16  Ht 5\' 2"  (1.575 m)  Wt 184 lb (83.462 kg)  BMI 33.65 kg/m2  SpO2 97%  Physical Exam  Nursing note and vitals reviewed. Constitutional: She is oriented to person, place, and time. She appears  well-developed and well-nourished. No distress.  HENT:  Head: Normocephalic and atraumatic.  Eyes: Pupils are equal, round, and reactive to light.  Neck: Normal range of motion. Neck supple.  Cardiovascular: Normal rate and regular rhythm.   Pulmonary/Chest: Effort normal.  Abdominal: Soft.  Neurological: She is oriented to person, place, and time. She has normal strength. No cranial nerve deficit (3-12 intact) or sensory deficit. She displays a negative Romberg sign.  Skin: Skin is warm and dry.    ED Course  Procedures (including critical care time)  Labs Reviewed - No data to display No results found.   1. Migraine       MDM  Patient given Benadryl Decadron and Toradol in ED. Pt asked to be D/C'd. Pt mother here to take her home. I have refilled her Zomig and written for Vicodin. Patient is to follow-up at headache clinic tomorrow.  NO red flags of headaches to be concerned about stroke, meningitis or any other acute intracranial process causing patients pain.  Pt has been advised of the symptoms that warrant their return to the ED. Patient has voiced understanding and has agreed to follow-up with the PCP or specialist.         Dorthula Matas, PA 05/24/12 0120

## 2012-05-24 NOTE — ED Notes (Signed)
ION:GE95<MW> Expected date:<BR> Expected time:<BR> Means of arrival:<BR> Comments:<BR> Hold for rm 24

## 2012-06-03 ENCOUNTER — Telehealth: Payer: Self-pay | Admitting: Internal Medicine

## 2012-06-03 MED ORDER — HYDROCHLOROTHIAZIDE 12.5 MG PO CAPS: ORAL_CAPSULE | ORAL | Status: DC

## 2012-06-03 MED ORDER — PROPRANOLOL HCL ER 60 MG PO CP24: ORAL_CAPSULE | ORAL | Status: DC

## 2012-06-03 NOTE — Telephone Encounter (Signed)
Refill: Hydrochlorothiazide 12.5 mg cp. 90 day supply Propranolol er 60mg  capsule. 90 day supply

## 2012-06-03 NOTE — Telephone Encounter (Signed)
RX sent

## 2012-07-04 ENCOUNTER — Ambulatory Visit (INDEPENDENT_AMBULATORY_CARE_PROVIDER_SITE_OTHER): Payer: Medicare Other | Admitting: Family Medicine

## 2012-07-04 ENCOUNTER — Telehealth: Payer: Self-pay | Admitting: Internal Medicine

## 2012-07-04 ENCOUNTER — Encounter: Payer: Self-pay | Admitting: Family Medicine

## 2012-07-04 VITALS — BP 125/79 | HR 65 | Temp 97.6°F | Ht 62.5 in | Wt 180.0 lb

## 2012-07-04 DIAGNOSIS — G43809 Other migraine, not intractable, without status migrainosus: Secondary | ICD-10-CM

## 2012-07-04 MED ORDER — KETOROLAC TROMETHAMINE 60 MG/2ML IM SOLN
60.0000 mg | Freq: Once | INTRAMUSCULAR | Status: AC
  Administered 2012-07-04: 60 mg via INTRAMUSCULAR

## 2012-07-04 MED ORDER — PROMETHAZINE HCL 25 MG/ML IJ SOLN
25.0000 mg | Freq: Once | INTRAMUSCULAR | Status: AC
  Administered 2012-07-04: 25 mg via INTRAMUSCULAR

## 2012-07-04 NOTE — Progress Notes (Signed)
  Subjective:    Patient ID: Ebony Garcia, female    DOB: 18-Feb-1951, 61 y.o.   MRN: [id number]  HPI Migraine- hx of similar x25 yrs.  Has been having HA since Friday.  Has previously seen Neuro for this, received Botox injxns in head and neck w/ some relief of sxs.  + nausea, no vomiting.  Pain encompasses 'whole head and neck'.  Described as a throbbing.  'woke up w/ it really bad this morning'.  + photophobia, phonophobia.  Sensitive to smells.  sxs tend to improve w/ phenergan and toradol.  Has not taken Relpax or Zomig- has used previously w/ good relief.  Used Migrainal spray w/out relief.  Alternating ice/heat.  No relief w/ ibuprofen.   Review of Systems For ROS see HPI     Objective:   Physical Exam  Vitals reviewed. Constitutional: She is oriented to person, place, and time. She appears well-developed and well-nourished.       Sitting in dark room w/ sunglasses on  HENT:  Head: Normocephalic and atraumatic.       TMs WNL No TTP over sinuses Minimal nasal congestion  Eyes: Conjunctivae and EOM are normal. Pupils are equal, round, and reactive to light.  Neck: Normal range of motion. Neck supple.  Cardiovascular: Normal rate, regular rhythm, normal heart sounds and intact distal pulses.   Pulmonary/Chest: Effort normal and breath sounds normal. No respiratory distress. She has no wheezes. She has no rales.  Lymphadenopathy:    She has no cervical adenopathy.  Neurological: She is alert and oriented to person, place, and time. She has normal reflexes. No cranial nerve deficit. Coordination normal.  Psychiatric: She has a normal mood and affect. Her behavior is normal. Judgment and thought content normal.          Assessment & Plan:

## 2012-07-04 NOTE — Telephone Encounter (Signed)
Schedule appointment today with Dr.Tabori, patient was offered to see her primary (Dr.Hopper) @ 4:30 and requested an earlier appointment with anybody.

## 2012-07-04 NOTE — Telephone Encounter (Signed)
Pt. Called and states she is having a terrible migraine & would like to be given a shot for this issue without seeing the dr. Please review and advise cb# [phone]

## 2012-07-04 NOTE — Telephone Encounter (Signed)
Caller: Ebony Garcia/Patient; PCP: Marga Melnick; CB#: [phone]; ; ; Call regarding Migraine; States she spoke with front desk requesting "Migraine Cocktail".  Onset:  "Been in the bed since Friday night(07/01/12)".  Took Migranal spray on 7/27 and 7/28 with some relief, but headache seems much worse.  Pain rated at 8.5 of 10. Caller states the "Migraine Cocktail" she rec'd in ED for previous headache  worked within 20 min, but she had to wait 5 hours to be seen. Disp see in 4 hours per Headache protocol.  No appointments available with PCP in this time frame.  Note to office for follow up.

## 2012-07-04 NOTE — Patient Instructions (Addendum)
Take the Zomig as needed for the migraines Give the Toradol and phenergan time to work SLEEP! Call with any questions or concerns Hang in there!

## 2012-07-04 NOTE — Assessment & Plan Note (Signed)
Pt w/ recurrent migraine.  toradol and phenergan given in office.  Samples of Zomig provided.  Pt to keep HA log.  Reviewed supportive care and red flags that should prompt return.  Pt expressed understanding and is in agreement w/ plan.

## 2012-07-29 ENCOUNTER — Other Ambulatory Visit: Payer: Self-pay | Admitting: Internal Medicine

## 2012-07-29 NOTE — Telephone Encounter (Signed)
Pt called stated she only has 1-left Advised pt protocol is 24-48hrs for refills  She would like it if this could be done today cb#[phone]

## 2012-07-29 NOTE — Telephone Encounter (Signed)
I called pharmacy and spoke with Arline Asp, rx was sent in June #90/1 refill (additional refills not due until 12/2011). RX is on file and this was sent to [place] and delayed in error of the pharmacy

## 2012-08-02 ENCOUNTER — Encounter: Payer: Self-pay | Admitting: Family Medicine

## 2012-08-02 ENCOUNTER — Ambulatory Visit (INDEPENDENT_AMBULATORY_CARE_PROVIDER_SITE_OTHER): Payer: Medicare Other | Admitting: Family Medicine

## 2012-08-02 VITALS — BP 125/75 | HR 57 | Temp 98.0°F | Ht 62.5 in | Wt 181.8 lb

## 2012-08-02 DIAGNOSIS — G43809 Other migraine, not intractable, without status migrainosus: Secondary | ICD-10-CM | POA: Diagnosis not present

## 2012-08-02 MED ORDER — PROMETHAZINE HCL 25 MG/ML IJ SOLN
25.0000 mg | Freq: Once | INTRAMUSCULAR | Status: AC
  Administered 2012-08-02: 25 mg via INTRAMUSCULAR

## 2012-08-02 MED ORDER — SUMATRIPTAN SUCCINATE 100 MG PO TABS: ORAL_TABLET | ORAL | Status: DC

## 2012-08-02 MED ORDER — KETOROLAC TROMETHAMINE 60 MG/2ML IM SOLN
60.0000 mg | Freq: Once | INTRAMUSCULAR | Status: AC
  Administered 2012-08-02: 60 mg via INTRAMUSCULAR

## 2012-08-02 NOTE — Progress Notes (Signed)
  Subjective:    Patient ID: Ebony Garcia, female    DOB: 20-Feb-1951, 61 y.o.   MRN: [id number]  HPI Migraines- pt has extensive hx of this, is actually on disability due to severity of sxs.  Pt reports these have increased in frequency recently.  Pt drank coffee this AM w/ mild improvement.  Pt has taken Zomig previously w/ success but stopped this due to recent switch to Prozac- feared serotonin syndrome.  Has taken Zomig and pristiq together previously w/out difficulty.  Does not currently have a neurologist- has previously seen Dr Vickey Huger and Dr Thomasena Edis at [facility]  Today HA is R sided, no visual changes but currently eye pain.  No nausea- able to eat 'a good breakfast'.  + photo and phonophobia.   Review of Systems For ROS see HPI     Objective:   Physical Exam  Vitals reviewed. Constitutional: She is oriented to person, place, and time. She appears well-developed and well-nourished. She appears distressed (obviously uncomfortable, wearing sunglasses inside).  HENT:  Head: Normocephalic and atraumatic.       TMs WNL No TTP over sinuses Minimal nasal congestion  Eyes: Conjunctivae and EOM are normal. Pupils are equal, round, and reactive to light.  Neck: Normal range of motion. Neck supple.  Cardiovascular: Normal rate, regular rhythm, normal heart sounds and intact distal pulses.   Pulmonary/Chest: Effort normal and breath sounds normal. No respiratory distress. She has no wheezes. She has no rales.  Lymphadenopathy:    She has no cervical adenopathy.  Neurological: She is alert and oriented to person, place, and time. She has normal reflexes. No cranial nerve deficit. Coordination normal.  Psychiatric: She has a normal mood and affect. Her behavior is normal. Judgment and thought content normal.          Assessment & Plan:

## 2012-08-02 NOTE — Addendum Note (Signed)
Addended by: Derry Lory A on: 08/02/2012 01:19 PM   Modules accepted: Orders

## 2012-08-02 NOTE — Assessment & Plan Note (Signed)
Deteriorated.  Pt w/ more frequent sxs recently.  Has not been using Triptan since switching to Prozac due to warnings she read on internet.  Has successfully taken Pristiq and triptan together.  Educated her about use of triptans and ability to delay or skip prozac dose as needed.  Suggested re-establishing w/ neuro- pt not interested at this time.  Prefers to wait 1-2 months to 'see how i'm doing'.  Script written for imitrex due to cost- pt will alert me if this is not as effective as the Zomig.  injxn of toradol and phenergan given in office today.

## 2012-08-02 NOTE — Patient Instructions (Addendum)
Follow up w/ Dr Alwyn Ren as planned Consider calling your neurologist due to increased frequency of headaches Take the Imitrex (Sumatriptan) as needed- hold Prozac if taking Call with any questions or concerns Hang in there!!!

## 2012-08-03 ENCOUNTER — Ambulatory Visit: Payer: Managed Care, Other (non HMO) | Admitting: Internal Medicine

## 2012-08-29 ENCOUNTER — Telehealth: Payer: Self-pay | Admitting: Internal Medicine

## 2012-08-29 DIAGNOSIS — N951 Menopausal and female climacteric states: Secondary | ICD-10-CM | POA: Diagnosis not present

## 2012-08-29 NOTE — Telephone Encounter (Signed)
Pts pharmacy told her to contact us for a refill on losartan 100mg  1/2 tablet every day. 30 day supply. Pt uses [facility] on [address]. [phone].

## 2012-08-30 MED ORDER — LOSARTAN POTASSIUM 100 MG PO TABS: ORAL_TABLET | ORAL | Status: DC

## 2012-08-30 NOTE — Telephone Encounter (Signed)
Left message on voicemail for patient to call and confirm quantity request 15 (30 day supply) vs 45 (90 day supply)

## 2012-08-30 NOTE — Telephone Encounter (Signed)
Pt returned your call. She states she only needs a 30 day supply (15 pills)

## 2012-08-30 NOTE — Telephone Encounter (Signed)
RX sent

## 2012-09-19 ENCOUNTER — Encounter: Payer: Self-pay | Admitting: Internal Medicine

## 2012-09-19 ENCOUNTER — Ambulatory Visit (INDEPENDENT_AMBULATORY_CARE_PROVIDER_SITE_OTHER): Payer: Medicare Other | Admitting: Internal Medicine

## 2012-09-19 VITALS — BP 128/80 | HR 56 | Temp 98.1°F | Resp 12 | Wt 181.6 lb

## 2012-09-19 DIAGNOSIS — J069 Acute upper respiratory infection, unspecified: Secondary | ICD-10-CM | POA: Diagnosis not present

## 2012-09-19 DIAGNOSIS — G43809 Other migraine, not intractable, without status migrainosus: Secondary | ICD-10-CM | POA: Diagnosis not present

## 2012-09-19 DIAGNOSIS — J209 Acute bronchitis, unspecified: Secondary | ICD-10-CM

## 2012-09-19 DIAGNOSIS — Z23 Encounter for immunization: Secondary | ICD-10-CM | POA: Diagnosis not present

## 2012-09-19 MED ORDER — DOXYCYCLINE HYCLATE 100 MG PO TABS: 100.0000 mg | ORAL_TABLET | Freq: Two times a day (BID) | ORAL | Status: DC

## 2012-09-19 MED ORDER — TOPIRAMATE 25 MG PO TABS: ORAL_TABLET | ORAL | Status: DC

## 2012-09-19 NOTE — Progress Notes (Signed)
  Subjective:    Patient ID: Ebony Garcia, female    DOB: 1951-09-06, 61 y.o.   MRN: [id number]  HPI  She describes daily headaches for at least 3 weeks; these vary in location and character. They can be unilateral, bilateral, frontal, occipital, or in the cervical area. These also vary as to being throbbing or dull aching. These can last 2-3 days. She is avoiding nonsteroidals as she is familiar with rebound headaches. Heat helps the neck pain; the headaches do respond to a mask which contains ice. She will also stay in a dark quiet room with benefit. Imitrex yesterday was of minimal benefit.  She previously had been on tryptans and Topamax; she's had the most relief from Botox treatment she received at [facility] 09/08/2010. Prophylactically and has been on antidepressant for 20 years as well      Review of Systems   She describes head congestion for approximately 2 weeks associated with some sore throat and nonproductive cough. She's had intermittent frontal headaches without associated nasal purulence, dental pain, or otic discharge. She's had no associated shortness of breath or wheezing. She also denies fever, chills, or sweats  Labs 04/18/12 were reviewed. Potassium was  3.4 at that time; fasting glucose was 115 but A1c was 5.9%. She has been avoiding potassium-containing fruits on her present diet      Objective:   Physical Exam General appearance:well nourished; no acute distress or increased work of breathing is present.  No  lymphadenopathy about the head, neck, or axilla noted.   Eyes: No conjunctival inflammation or lid edema is present. There is no scleral icterus. EOMI  Ears:  External ear exam shows no significant lesions or deformities.  Otoscopic examination reveals clear canals, tympanic membranes are intact bilaterally without bulging, retraction, inflammation or discharge.  Nose:  External nasal examination shows no deformity or inflammation. Nasal  mucosa are pink and moist without lesions or exudates. No septal dislocation or deviation.No obstruction to airflow.   Oral exam: Dental hygiene is good; lips and gums are healthy appearing.There is no oropharyngeal erythema or exudate noted.   Neck:  No deformities, thyromegaly, masses, or tenderness noted.   Supple with full range of motion without pain.   Heart:  Normal rate and regular rhythm. S1 and S2 normal without gallop, murmur, click, rub . S 4 w/o  extra sounds.   Lungs:Chest clear to auscultation; no wheezes, rhonchi,rales ,or rubs present.No increased work of breathing.    Extremities:  No cyanosis, edema, or clubbing  noted    Skin: Warm & dry w/o jaundice or tenting.  Neuro: Alert and oriented; deep tendon reflexes, tone, and strength normal          Assessment & Plan:  #1 migraine variant; responsive to Botox by history  #2 upper respiratory tract infection without frank rhinosinusitis  #3 bronchitis  #4 multiple drug allergies, intolerances  Plan: See orders and recommendations

## 2012-09-19 NOTE — Patient Instructions (Addendum)
If symptoms persist despite titrating the Topamax and taking tryptans; referral will be made to Dr. Thomasena Edis at [facility]. To increase  Potassium (K+) increase citrus fruits & bananas in diet and use the salt substitute No Salt, which contains  potassium , to season food @ the table.

## 2012-10-20 ENCOUNTER — Other Ambulatory Visit: Payer: Self-pay | Admitting: Internal Medicine

## 2012-10-20 NOTE — Telephone Encounter (Signed)
Rx sent.    MW 

## 2012-10-25 ENCOUNTER — Other Ambulatory Visit: Payer: Self-pay | Admitting: Internal Medicine

## 2012-10-25 NOTE — Telephone Encounter (Signed)
Rx sent.    MW 

## 2012-10-27 ENCOUNTER — Telehealth: Payer: Self-pay

## 2012-10-27 NOTE — Telephone Encounter (Signed)
Generic Flexeril 5 mg one to 2 each bedtime when necessary; dispense 20.
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
## 2012-10-28 ENCOUNTER — Ambulatory Visit (INDEPENDENT_AMBULATORY_CARE_PROVIDER_SITE_OTHER): Payer: Medicare Other | Admitting: Family Medicine

## 2012-10-28 ENCOUNTER — Encounter: Payer: Self-pay | Admitting: Family Medicine

## 2012-10-28 VITALS — BP 118/82 | HR 51 | Temp 98.5°F | Wt 176.0 lb

## 2012-10-28 DIAGNOSIS — G43809 Other migraine, not intractable, without status migrainosus: Secondary | ICD-10-CM

## 2012-10-28 MED ORDER — CYCLOBENZAPRINE HCL 5 MG PO TABS: ORAL_TABLET | ORAL | Status: DC

## 2012-10-28 MED ORDER — PROMETHAZINE HCL 25 MG/ML IJ SOLN
25.0000 mg | Freq: Once | INTRAMUSCULAR | Status: AC
  Administered 2012-10-28: 25 mg via INTRAVENOUS

## 2012-10-28 MED ORDER — KETOROLAC TROMETHAMINE 60 MG/2ML IJ SOLN
60.0000 mg | Freq: Once | INTRAMUSCULAR | Status: AC
  Administered 2012-10-28: 60 mg via INTRAVENOUS

## 2012-10-28 NOTE — Telephone Encounter (Signed)
RX sent in, left message on voicemail informing patient

## 2012-10-28 NOTE — Progress Notes (Signed)
  Subjective:    Patient ID: Ebony Garcia, female    DOB: 09/03/1951, 61 y.o.   MRN: [id number]  HPI Migraines- sxs started 2 days ago.  Imitrex w/out relief.  Has not taken Advil- 'usually it does nothing'.  Took Phenergan last night which allowed sleep.  HA is now band like around head at level of forehead.  Also w/ neck pain.  Pain rated 8/10.  No vomiting.  Mild nausea.  + photo and phonophobia.  This is 1st migraine since starting Topamax on 10/14.   Review of Systems For ROS see HPI     Objective:   Physical Exam  Vitals reviewed. Constitutional: She is oriented to person, place, and time. She appears well-developed and well-nourished. No distress.  HENT:  Head: Normocephalic and atraumatic.       TMs WNL No TTP over sinuses Minimal nasal congestion  Eyes: Conjunctivae normal and EOM are normal. Pupils are equal, round, and reactive to light.  Neck: Normal range of motion. Neck supple.  Cardiovascular: Normal rate, regular rhythm, normal heart sounds and intact distal pulses.   Pulmonary/Chest: Effort normal and breath sounds normal. No respiratory distress. She has no wheezes. She has no rales.  Lymphadenopathy:    She has no cervical adenopathy.  Neurological: She is alert and oriented to person, place, and time. She has normal reflexes. No cranial nerve deficit. Coordination normal.  Psychiatric: She has a normal mood and affect. Her behavior is normal. Judgment and thought content normal.          Assessment & Plan:

## 2012-10-28 NOTE — Patient Instructions (Addendum)
Start the Flexeril when you get home- start w/ 1 tab REST! Heating pad for your neck tension Call with any questions or concerns Hang in there! Happy Holidays!

## 2012-10-31 NOTE — Assessment & Plan Note (Signed)
Recurrent issue for pt but 1st HA since starting Topamax on 10/14.  Pt has ride available so toradol and phenergan injxns given.  Reviewed supportive care and red flags that should prompt return.  Pt expressed understanding and is in agreement w/ plan.

## 2012-11-15 ENCOUNTER — Ambulatory Visit: Payer: Medicare Other | Admitting: Internal Medicine

## 2012-11-21 ENCOUNTER — Other Ambulatory Visit: Payer: Self-pay | Admitting: Internal Medicine

## 2012-12-06 ENCOUNTER — Encounter: Payer: Self-pay | Admitting: Lab

## 2012-12-08 ENCOUNTER — Ambulatory Visit (INDEPENDENT_AMBULATORY_CARE_PROVIDER_SITE_OTHER): Payer: Medicare Other | Admitting: Internal Medicine

## 2012-12-08 ENCOUNTER — Encounter: Payer: Self-pay | Admitting: Internal Medicine

## 2012-12-08 VITALS — BP 118/80 | HR 55 | Temp 97.9°F | Resp 14 | Wt 179.0 lb

## 2012-12-08 DIAGNOSIS — R7309 Other abnormal glucose: Secondary | ICD-10-CM

## 2012-12-08 DIAGNOSIS — L708 Other acne: Secondary | ICD-10-CM

## 2012-12-08 DIAGNOSIS — E876 Hypokalemia: Secondary | ICD-10-CM | POA: Diagnosis not present

## 2012-12-08 DIAGNOSIS — L700 Acne vulgaris: Secondary | ICD-10-CM

## 2012-12-08 DIAGNOSIS — I10 Essential (primary) hypertension: Secondary | ICD-10-CM

## 2012-12-08 DIAGNOSIS — Z79899 Other long term (current) drug therapy: Secondary | ICD-10-CM | POA: Diagnosis not present

## 2012-12-08 LAB — BASIC METABOLIC PANEL
BUN: 16 mg/dL (ref 6–23)
CO2: 23 mEq/L (ref 19–32)
Calcium: 9 mg/dL (ref 8.4–10.5)
Chloride: 108 mEq/L (ref 96–112)
Creatinine, Ser: 0.8 mg/dL (ref 0.4–1.2)
GFR: 74.74 mL/min (ref >60.00)
Glucose, Bld: 94 mg/dL (ref 70–99)
Potassium: 3.7 mEq/L (ref 3.5–5.1)
Sodium: 139 mEq/L (ref 135–145)

## 2012-12-08 LAB — HEMOGLOBIN A1C: Hgb A1c MFr Bld: 5.9 % (ref 4.6–6.5)

## 2012-12-08 LAB — CORTISOL: Cortisol, Plasma: 5.3 ug/dL

## 2012-12-08 MED ORDER — BENZOYL PEROXIDE-ERYTHROMYCIN 5-3 % EX GEL: Freq: Two times a day (BID) | CUTANEOUS | Status: DC

## 2012-12-08 NOTE — Progress Notes (Signed)
  Subjective:    Patient ID: Ebony Garcia, female    DOB: [birth year], 62 y.o.   MRN: [id number]  HPI RASH: Location: face & neck  Onset: . 1 month ago   Course: progressive , 1-4 new lesions overnight Self-treated with: MOM , Proactiv topicals              Improvement with treatment: temporarily    Review of Systems  Pruritis: some lesions do; some pustular  Tenderness: some of lesions New medications/antibiotics:no Tick/insect/pet exposure:no Recent travel: no New detergent, new clothing, or other topical exposure: only treatment above  Feeling ill: no Fever:no Mouth lesions: no Facial/tongue swelling/difficulty breathing:  no Diabetic or immunocompromised: pre Diabetes resolved with diet & weight loss  K+ 3.4   & glucose 115 in  5/13. By report blood pressures been well controlled in the range of 120s over 70- 80 @ home despite stopping ARB.   PMH of significant acne. No PMH of Rosacea or MRSA.     Objective:   Physical Exam General appearance:good health ;well nourished; no acute distress or increased work of breathing is present.  No  lymphadenopathy about the head, neck, or axilla noted.   Eyes: No conjunctival inflammation or lid edema is present. There is no scleral icterus.  Ears:  External ear exam shows no significant lesions or deformities.  Otoscopic examination reveals clear canals, tympanic membranes are intact bilaterally without bulging, retraction, inflammation or discharge.  Nose:  External nasal examination shows no deformity or inflammation. Nasal mucosa are pink and moist without lesions or exudates. No septal dislocation or deviation.No obstruction to airflow.   Oral exam: Dental hygiene is good; lips and gums are healthy appearing.There is no oropharyngeal erythema or exudate noted.   Neck:  No deformities,  masses, or tenderness noted.     Heart:  Normal rate and regular rhythm. S1 and S2 normal without gallop, murmur, click, rub or other  extra sounds.   Lungs:Chest clear to auscultation; no wheezes, rhonchi,rales ,or rubs present.No increased work of breathing.    Extremities:  No cyanosis, edema, or clubbing  noted    Skin: Warm & dry . Localized erythematous, papular lesions  left cheek. No pustules visible at this time          Assessment & Plan:  #1 facial and neck rash; acneiform dermatitis suggested rather than rosacea  #2 hypertension, controlled  #3 hyperglycemia  #4 hypokalemia   Based on 2-4 ; R/O endogenous cortisol excess  Plan: See orders/ recommendations

## 2012-12-08 NOTE — Patient Instructions (Addendum)
Blood Pressure Goal  Ideally is an AVERAGE < 135/85. This AVERAGE should be calculated from @ least 5-7 BP readings taken @ different times of day on different days of week. You should not respond to isolated BP readings , but rather the AVERAGE for that week  

## 2012-12-09 ENCOUNTER — Encounter: Payer: Self-pay | Admitting: Internal Medicine

## 2012-12-23 ENCOUNTER — Other Ambulatory Visit: Payer: Self-pay | Admitting: Internal Medicine

## 2012-12-23 DIAGNOSIS — Z1231 Encounter for screening mammogram for malignant neoplasm of breast: Secondary | ICD-10-CM

## 2012-12-30 ENCOUNTER — Encounter: Payer: Self-pay | Admitting: Internal Medicine

## 2013-01-09 ENCOUNTER — Encounter: Payer: Self-pay | Admitting: Internal Medicine

## 2013-01-09 NOTE — Telephone Encounter (Signed)
Noted  

## 2013-01-17 ENCOUNTER — Ambulatory Visit: Payer: Medicare Other

## 2013-01-20 ENCOUNTER — Other Ambulatory Visit: Payer: Self-pay | Admitting: Internal Medicine

## 2013-02-07 ENCOUNTER — Other Ambulatory Visit: Payer: Self-pay | Admitting: Internal Medicine

## 2013-02-22 DIAGNOSIS — F3342 Major depressive disorder, recurrent, in full remission: Secondary | ICD-10-CM | POA: Diagnosis not present

## 2013-03-03 ENCOUNTER — Ambulatory Visit: Payer: Medicare Other

## 2013-03-07 ENCOUNTER — Ambulatory Visit (INDEPENDENT_AMBULATORY_CARE_PROVIDER_SITE_OTHER): Payer: Medicare Other | Admitting: Internal Medicine

## 2013-03-07 ENCOUNTER — Encounter: Payer: Self-pay | Admitting: Internal Medicine

## 2013-03-07 ENCOUNTER — Ambulatory Visit (HOSPITAL_BASED_OUTPATIENT_CLINIC_OR_DEPARTMENT_OTHER)
Admission: RE | Admit: 2013-03-07 | Discharge: 2013-03-07 | Disposition: A | Discharge status: Home / Self Care | Payer: Medicare Other | Source: Ambulatory Visit | Attending: Internal Medicine | Admitting: Internal Medicine

## 2013-03-07 VITALS — BP 120/84 | HR 50 | Temp 97.9°F | Resp 12 | Wt 176.0 lb

## 2013-03-07 DIAGNOSIS — R002 Palpitations: Secondary | ICD-10-CM | POA: Diagnosis not present

## 2013-03-07 DIAGNOSIS — R0789 Other chest pain: Secondary | ICD-10-CM

## 2013-03-07 DIAGNOSIS — M546 Pain in thoracic spine: Secondary | ICD-10-CM | POA: Insufficient documentation

## 2013-03-07 DIAGNOSIS — F3289 Other specified depressive episodes: Secondary | ICD-10-CM

## 2013-03-07 DIAGNOSIS — G43809 Other migraine, not intractable, without status migrainosus: Secondary | ICD-10-CM | POA: Diagnosis not present

## 2013-03-07 DIAGNOSIS — F329 Major depressive disorder, single episode, unspecified: Secondary | ICD-10-CM | POA: Diagnosis not present

## 2013-03-07 DIAGNOSIS — R079 Chest pain, unspecified: Secondary | ICD-10-CM | POA: Diagnosis not present

## 2013-03-07 LAB — TROPONIN I: Troponin I: <0.01 ng/mL (ref <0.06)

## 2013-03-07 LAB — CK TOTAL AND CKMB (NOT AT ARMC)
CK, MB: 0.9 ng/mL (ref 0.3–4.0)
Total CK: 51 U/L (ref 7–177)

## 2013-03-07 LAB — D-DIMER, QUANTITATIVE: D-Dimer, Quant: 0.29 ug/mL-FEU (ref 0.00–0.48)

## 2013-03-07 MED ORDER — TRAMADOL HCL 50 MG PO TABS: 50.0000 mg | ORAL_TABLET | Freq: Four times a day (QID) | ORAL | Status: DC | PRN

## 2013-03-07 MED ORDER — TOPIRAMATE 25 MG PO TABS: ORAL_TABLET | ORAL | Status: DC

## 2013-03-07 NOTE — Progress Notes (Signed)
Subjective:    Patient ID: Ebony Garcia, female    DOB: 29-Oct-1951, 62 y.o.   MRN: [id number]  HPI  She describes increased frequency of "bad headaches" unlike her migraines over the last 2-3 weeks. This is described as constant, diffuse, and throbbing. It is associated with photophobia & noise intolerance. The headaches can last up to 48 hours. These have interrupted her church & social schedule.  Imitrex did not provide adequate relief; there was minimal response 7-8 hours after taking the pill. Nonsteroidals have been marginal in affect. Ice packs helped the pain in her neck.  Past medical history/family history/social history were all reviewed and updated. Pertinent data:Best response to migraine with Botox @ [facility]. MGM had MI > 65.      Review of Systems Last night she developed palpitations described as a fast heart rate & the heart "beating loudly". This occurred at rest. There was no associated chest pain , dyspnea, nausea ,or diaphoresis.  She had a "soreness" between her shoulder blades yesterday for approximately an hour.      Objective:   Physical Exam Gen.: Well-nourished in appearance. Alert, appropriate and cooperative throughout exam.Appears uncomfortable w/o distress  Head: Normocephalic without obvious abnormalities  Eyes: No corneal or conjunctival inflammation noted. Pupils equal round reactive to light and accommodation. Fundal exam is benign without hemorrhages, exudate, papilledema. Extraocular motion intact. Vision grossly normal without lenses. FOV WNL Ears: External  ear exam reveals no significant lesions or deformities. Canals clear .TMs normal. Hearing is grossly normal bilaterally. Nose: External nasal exam reveals no deformity or inflammation. Nasal mucosa are pink and moist. No lesions or exudates noted.   Mouth: Oral mucosa and oropharynx reveal no lesions or exudates. Teeth in good repair. Neck: No deformities, masses, or tenderness noted. Range of  motion & Thyroid normal Lungs: Normal respiratory effort; chest expands symmetrically. Lungs are clear to auscultation without rales, wheezes, or increased work of breathing. Heart: Normal rate and rhythm. Normal S1 and S2. No gallop, click, or rub. S4 w/o murmur. Abdomen: Bowel sounds normal; abdomen soft and nontender. No masses, organomegaly or hernias noted.                          Musculoskeletal/extremities: No clubbing, cyanosis, edema, or significant extremity  deformity noted. Range of motion normal .Tone & strength  Normal. Joints normal . Nail health good. Able to lie down & sit up w/o help.  Vascular: Carotid, radial artery, dorsalis pedis and  posterior tibial pulses are full and equal. No bruits present. Homan's negative Neurologic: Alert and oriented x3. Deep tendon reflexes symmetrical and normal. No cranial nerve deficit         Skin: Intact without suspicious lesions or rashes. Lymph: No cervical, axillary lymphadenopathy present. Psych: Mood and affect are flat but normally interactive                                                                                        Assessment & Plan:   #1 atypical headache; probable conversion of migraine to chronic daily headache. She has had response to Topamax in  the past; this will be titrated.  #2 palpitations; EKG reveals nondiagnostic ST-T changes intralateral he. This makes me  uncomfortable with her continueing Imitrex at this time.  #3 interscapular thoracic pain. Cardiopulmonary enzymes will be checked  Plan:see orders

## 2013-03-07 NOTE — Patient Instructions (Addendum)
Please keep a diary of your headaches . Document  each occurrence on the calendar with notation of : #1 any prodrome ( any non headache symptom such as marked fatigue,visual changes, ,etc ) which precedes actual headache ; #2) severity on 1-10 scale; #3) any triggers ( food/ drink,enviromenntal or weather changes ,physical or emotional stress) in 8-12 hour period prior to the headache; & #4) response to any medications or other intervention. Please review "Headache" @ [facility] for additional information.   Order for x-rays entered into  the computer; these will be performed at [facility]. No appointment is necessary.

## 2013-03-08 ENCOUNTER — Encounter: Payer: Self-pay | Admitting: Internal Medicine

## 2013-03-08 ENCOUNTER — Telehealth: Payer: Self-pay | Admitting: Internal Medicine

## 2013-03-08 LAB — CBC WITH DIFFERENTIAL/PLATELET
Basophils Absolute: 0 10*3/uL (ref 0.0–0.1)
Basophils Relative: 0.6 % (ref 0.0–3.0)
Eosinophils Absolute: 0.1 10*3/uL (ref 0.0–0.7)
Eosinophils Relative: 1.7 % (ref 0.0–5.0)
HCT: 41.2 % (ref 36.0–46.0)
Hemoglobin: 13.8 g/dL (ref 12.0–15.0)
Lymphocytes Relative: 22.4 % (ref 12.0–46.0)
Lymphs Abs: 1.8 10*3/uL (ref 0.7–4.0)
MCHC: 33.5 g/dL (ref 30.0–36.0)
MCV: 90.8 fl (ref 78.0–100.0)
Monocytes Absolute: 0.5 10*3/uL (ref 0.1–1.0)
Monocytes Relative: 6.4 % (ref 3.0–12.0)
Neutro Abs: 5.4 10*3/uL (ref 1.4–7.7)
Neutrophils Relative %: 68.9 % (ref 43.0–77.0)
Platelets: 168 10*3/uL (ref 150.0–400.0)
RBC: 4.54 Mil/uL (ref 3.87–5.11)
RDW: 13.1 % (ref 11.5–14.6)
WBC: 7.8 10*3/uL (ref 4.5–10.5)

## 2013-03-08 LAB — MAGNESIUM: Magnesium: 2.2 mg/dL (ref 1.5–2.5)

## 2013-03-08 LAB — TSH: TSH: 2.46 u[IU]/mL (ref 0.35–5.50)

## 2013-03-08 LAB — BASIC METABOLIC PANEL
BUN: 13 mg/dL (ref 6–23)
CO2: 24 mEq/L (ref 19–32)
Calcium: 9.1 mg/dL (ref 8.4–10.5)
Chloride: 108 mEq/L (ref 96–112)
Creatinine, Ser: 0.9 mg/dL (ref 0.4–1.2)
GFR: 68.89 mL/min (ref >60.00)
Glucose, Bld: 95 mg/dL (ref 70–99)
Potassium: 3.9 mEq/L (ref 3.5–5.1)
Sodium: 140 mEq/L (ref 135–145)

## 2013-03-08 NOTE — Telephone Encounter (Signed)
With intolerance or allergy to hydrocodone and sulfa; I know of no other options for pain medication. Tramadol can be increased to 2 every 6 hrs as needed.Because of the persistence of the headache; I do recommend evaluation by a headache specialist as soon as possible.Does she have a preference.

## 2013-03-08 NOTE — Telephone Encounter (Signed)
Spoke with patient, patient verbalized understanding. Patient states as far as referral she would like to hold off for now. Patient has see Dr.Lewit (Neuro), Dr. Vickey Huger (Neuro) and [facility] in which all told her there was nothing further they could do for her.

## 2013-03-08 NOTE — Telephone Encounter (Signed)
Patient called stating she still has a headache and can hear her heart beating in her head. She took tramadol last night and this morning and states it is not working; she states her pain is a 7/10. She would like to know if there is another medication she can take.

## 2013-03-08 NOTE — Telephone Encounter (Signed)
Patient seen yesterday, Dr.Hopper please advise

## 2013-03-09 ENCOUNTER — Ambulatory Visit (HOSPITAL_BASED_OUTPATIENT_CLINIC_OR_DEPARTMENT_OTHER): Payer: Medicare Other

## 2013-03-09 ENCOUNTER — Encounter: Payer: Self-pay | Admitting: Internal Medicine

## 2013-03-09 NOTE — Telephone Encounter (Signed)
Noted  

## 2013-03-17 ENCOUNTER — Other Ambulatory Visit: Payer: Self-pay | Admitting: Internal Medicine

## 2013-03-29 ENCOUNTER — Encounter: Payer: Self-pay | Admitting: Internal Medicine

## 2013-03-31 ENCOUNTER — Encounter: Payer: Self-pay | Admitting: Internal Medicine

## 2013-03-31 ENCOUNTER — Telehealth: Payer: Self-pay | Admitting: General Practice

## 2013-03-31 DIAGNOSIS — D242 Benign neoplasm of left breast: Secondary | ICD-10-CM

## 2013-03-31 NOTE — Telephone Encounter (Signed)
-----   Message sent from Pecola Lawless, MD to Ebony Garcia at 03/31/2013 2:49 PM ----- [name], can this be ordered as [facility]. Dx: PMH of papilloma breast    This is IMPORTANT! Please request a DIAGNOSTIC mammogram for me at [facility], [phone] ASAP. I have unexplained bruises on my left breast and am itching. Also, due to the fact I had a papilloma removed several years ago from my right breast, I HAVE to have a diagnostic done instead of the usual mammogram. I tried to get this done earlier at the place on [address] you sent me to. THANKS!   Per hopp this order was placed.

## 2013-04-02 ENCOUNTER — Encounter: Payer: Self-pay | Admitting: Internal Medicine

## 2013-04-03 ENCOUNTER — Other Ambulatory Visit: Payer: Self-pay

## 2013-04-03 ENCOUNTER — Encounter: Payer: Self-pay | Admitting: Internal Medicine

## 2013-04-03 MED ORDER — TOPIRAMATE 25 MG PO TABS: ORAL_TABLET | ORAL | Status: DC

## 2013-04-03 NOTE — Telephone Encounter (Signed)
Per Mychart Topamax rx was sent in for the wrong dispense number and patient would like this corrected, done.

## 2013-04-04 ENCOUNTER — Telehealth: Payer: Self-pay | Admitting: Internal Medicine

## 2013-04-04 DIAGNOSIS — S2000XA Contusion of breast, unspecified breast, initial encounter: Secondary | ICD-10-CM

## 2013-04-04 DIAGNOSIS — N644 Mastodynia: Secondary | ICD-10-CM

## 2013-04-04 DIAGNOSIS — N6489 Other specified disorders of breast: Secondary | ICD-10-CM

## 2013-04-04 NOTE — Telephone Encounter (Signed)
Contacted [facility] center. Orders placed

## 2013-04-04 NOTE — Telephone Encounter (Signed)
Patient was calling stating that her orders for mammogram is incorrect and we need to contact [phone] [facility]) to find out how order should be placed

## 2013-04-05 ENCOUNTER — Encounter: Payer: Self-pay | Admitting: Internal Medicine

## 2013-04-07 ENCOUNTER — Ambulatory Visit (INDEPENDENT_AMBULATORY_CARE_PROVIDER_SITE_OTHER): Payer: Medicare Other | Admitting: Internal Medicine

## 2013-04-07 ENCOUNTER — Encounter: Payer: Self-pay | Admitting: Internal Medicine

## 2013-04-07 VITALS — BP 134/82 | HR 67 | Wt 174.4 lb

## 2013-04-07 DIAGNOSIS — L282 Other prurigo: Secondary | ICD-10-CM

## 2013-04-07 DIAGNOSIS — G43809 Other migraine, not intractable, without status migrainosus: Secondary | ICD-10-CM

## 2013-04-07 DIAGNOSIS — F439 Reaction to severe stress, unspecified: Secondary | ICD-10-CM

## 2013-04-07 DIAGNOSIS — G43009 Migraine without aura, not intractable, without status migrainosus: Secondary | ICD-10-CM

## 2013-04-07 DIAGNOSIS — Z733 Stress, not elsewhere classified: Secondary | ICD-10-CM

## 2013-04-07 MED ORDER — MOMETASONE FUROATE 0.1 % EX OINT: TOPICAL_OINTMENT | CUTANEOUS | Status: DC

## 2013-04-07 MED ORDER — HYDROXYZINE HCL 10 MG PO TABS: ORAL_TABLET | ORAL | Status: DC

## 2013-04-07 NOTE — Patient Instructions (Addendum)
Please keep a diary of your headaches . Document  each occurrence on the calendar with notation of : #1 any prodrome ( any non headache symptom such as marked fatigue,visual changes, ,etc ) which precedes actual headache ; #2) severity on 1-10 scale; #3) any triggers ( food/ drink,enviromenntal or weather changes ,physical or emotional stress) in 8-12 hour period prior to the headache; & #4) response to any medications or other intervention. Please review "Headache" @ [facility] for additional information.   Please see Ebony Garcia to discuss issues in this note.

## 2013-04-07 NOTE — Progress Notes (Signed)
Subjective:    Patient ID: Ebony Garcia, female    DOB: 12/25/1950, 62 y.o.   MRN: [id number]  HPI Her headaches resolved early on the morning of 04/06/29; in the past week she had one headache that lasted 72 hours. She is on Topamax 25 mg 2 pills twice a day as well as propranolol 60 mg twice a day. She cannot define any specific trigger for the headaches such as diet, medication, or activities.  She is questioning stopping all medications and reassessing her headaches as the response to multiple regimens has been inconsistent at best over 25 years.  She developed a scattered, pruritic rash several days after working in yard 03/14/13. Calamine, Benadryl, and over-the-counter steroids have been somewhat beneficial. She questioned whether the rash might be related to tramadol. Her last dose of tramadol was 9 days ago.    Review of Systems Her response to Imitrex in reference to the headaches has been variable. It can be beneficial at other times ineffective. Her blood pressure has been low in the range of 107/60+.  Her divorce after 9.5 years of marriage became finalized July. Her ex-husband has been contacting her; he texted her 60 times yesterday. There were differences in phase; she also states that he had difficulty adjusting to her health issues and weight gain of 60 pounds after the marriage. There were questions of fidelity related to Internet use.    Objective:   Physical Exam Gen.:  well-nourished in appearance. Alert, appropriate and cooperative throughout exam. Head: Normocephalic without obvious abnormalities  Eyes: No corneal or conjunctival inflammation noted. Pupils equal round reactive to light and accommodation.  Extraocular motion intact.  Neck: No deformities, masses, or tenderness noted. Range of motion .Thyroid physiologically asymmetric, R > L lobe. Lungs: Normal respiratory effort; chest expands symmetrically. Lungs are clear to auscultation without rales, wheezes, or  increased work of breathing. Heart: Normal rate and rhythm. Split S1 . No gallop, click, or rub. Grade 1 systolic murmur at the right base . Abdomen: Bowel sounds normal; abdomen soft and nontender. No masses, organomegaly or hernias noted.                        Musculoskeletal/extremities: No clubbing, cyanosis, edema, or significant extremity  deformity noted. Range of motion normal .Tone & strength  Normal. Joints normal. Nail health good. Able to lie down & sit up w/o help. Negative SLR bilaterally Vascular: Carotid, radial artery, dorsalis pedis and  posterior tibial pulses are full and equal. No bruits present. Neurologic: Alert and oriented x3. Deep tendon reflexes symmetrical and normal.          Skin: There are scattered small areas of dermatitis with some excoriation component. Some these exhibit a linear pattern. These are predominantly over the forearms. She also has less involvement of the anterior chest and  abdominal area Lymph: No cervical, axillary lymphadenopathy present. Psych: Mood and affect are normal. Normally interactive . Appropriately she became tearful discussing her ex-husband in the present relationship.  Assessment & Plan:  #1 migraines, atypical with variable response to tryptans  #2 dermatitis; probable neurodermatitis in the context of stress. I doubt that this is related to the tramadol. The time interval also suggests that it is not related to poison oak/ivy  #3 exogenous stress related to her relationship with her ex-husband and her frustrations with the chronic health issues.  Plan: I encourage her to keep a headache diary to see if there is some pattern. My major concern is her emotional state. She questioning stopping all medications. Certainly this could be considered as a slow wean of one medication that time. Prior to considering such; I recommend followup with  Ellis Savage to consider adjusting her psychotropic medications if indicated

## 2013-04-09 ENCOUNTER — Encounter: Payer: Self-pay | Admitting: Internal Medicine

## 2013-04-10 DIAGNOSIS — Z9189 Other specified personal risk factors, not elsewhere classified: Secondary | ICD-10-CM | POA: Diagnosis not present

## 2013-04-10 DIAGNOSIS — N951 Menopausal and female climacteric states: Secondary | ICD-10-CM | POA: Diagnosis not present

## 2013-04-10 DIAGNOSIS — Z01419 Encounter for gynecological examination (general) (routine) without abnormal findings: Secondary | ICD-10-CM | POA: Diagnosis not present

## 2013-04-14 ENCOUNTER — Ambulatory Visit
Admission: RE | Admit: 2013-04-14 | Discharge: 2013-04-14 | Disposition: A | Discharge status: Home / Self Care | Payer: Medicare Other | Source: Ambulatory Visit | Attending: Internal Medicine | Admitting: Internal Medicine

## 2013-04-14 DIAGNOSIS — N6489 Other specified disorders of breast: Secondary | ICD-10-CM

## 2013-04-14 DIAGNOSIS — S2000XA Contusion of breast, unspecified breast, initial encounter: Secondary | ICD-10-CM

## 2013-04-14 DIAGNOSIS — R922 Inconclusive mammogram: Secondary | ICD-10-CM | POA: Diagnosis not present

## 2013-04-14 DIAGNOSIS — N644 Mastodynia: Secondary | ICD-10-CM

## 2013-04-19 ENCOUNTER — Encounter: Payer: Self-pay | Admitting: Internal Medicine

## 2013-04-19 ENCOUNTER — Other Ambulatory Visit: Payer: Self-pay | Admitting: Internal Medicine

## 2013-04-19 DIAGNOSIS — R002 Palpitations: Secondary | ICD-10-CM

## 2013-04-19 DIAGNOSIS — R011 Cardiac murmur, unspecified: Secondary | ICD-10-CM

## 2013-04-20 ENCOUNTER — Encounter: Payer: Self-pay | Admitting: Internal Medicine

## 2013-04-21 ENCOUNTER — Encounter: Payer: Self-pay | Admitting: Internal Medicine

## 2013-04-25 ENCOUNTER — Ambulatory Visit (HOSPITAL_BASED_OUTPATIENT_CLINIC_OR_DEPARTMENT_OTHER)
Admission: RE | Admit: 2013-04-25 | Discharge: 2013-04-25 | Disposition: A | Discharge status: Home / Self Care | Payer: Medicare Other | Source: Ambulatory Visit | Attending: Internal Medicine | Admitting: Internal Medicine

## 2013-04-25 ENCOUNTER — Encounter: Payer: Self-pay | Admitting: Internal Medicine

## 2013-04-25 ENCOUNTER — Other Ambulatory Visit: Payer: Self-pay | Admitting: Internal Medicine

## 2013-04-25 ENCOUNTER — Ambulatory Visit (INDEPENDENT_AMBULATORY_CARE_PROVIDER_SITE_OTHER): Payer: Medicare Other | Admitting: Internal Medicine

## 2013-04-25 VITALS — BP 126/80 | HR 51 | Wt 178.0 lb

## 2013-04-25 DIAGNOSIS — M79609 Pain in unspecified limb: Secondary | ICD-10-CM | POA: Diagnosis not present

## 2013-04-25 DIAGNOSIS — M79671 Pain in right foot: Secondary | ICD-10-CM

## 2013-04-25 MED ORDER — INDOMETHACIN 50 MG PO CAPS: 50.0000 mg | ORAL_CAPSULE | Freq: Three times a day (TID) | ORAL | Status: DC

## 2013-04-25 NOTE — Progress Notes (Signed)
  Subjective:    Patient ID: Ebony Garcia, female    DOB: 04/14/1951, 62 y.o.   MRN: [id number]  HPI   Symptoms began 04/15/13 as diffuse pain in the right foot upon awakening. There was no trigger or injury; she had not begun to ambulate prior to the onset of pain. She states that the pain was aggravated even by the pressure of the sheets. It is described as burning and aching and intermittently throbbing. Pain  intermittently radiated to the right upper calf to the knee level as well as up the anterior shin to the knee.  The pain was associated with swelling of the foot.  There was minimal response to up to 3 Advil at a time.  She is on hydrochlorothiazide. She has no history of gout. The only injury to this extremity  was a severe sprain of the right foot in 1999. There was no sequela.    Review of Systems  There was no associated redness of the foot or rash. She's had no fever, chills, or sweats. She has no other joint symptoms.       Objective:   Physical Exam  She appears healthy and well-nourished and in no distress  She has slight accentuation of the upper thoracic curvature.  Deep tendon reflexes, strength, and tone are normal  Skin reveals no significant lesions or change in color or temperature.  There is pain to compression of the right foot. Homans sign is negative in the right calf.  Range of motion of the ankle does not appear to elicit the pain. Any palpation of the foot resulted in discomfort particularly along the lateral aspect of the right foot          Assessment & Plan:  #1 severe right foot pain with no trigger or injury. She is on hydrochlorothiazide; this may represent atypical gout presentation vs RSD.  Plan: Uric acid; films of the foot; indomethacin 50 mg every 8 hours with food as needed. She'll be asked to discontinue hydrochlorothiazide and monitor blood pressure pending results.  If symptoms persist or progress and studies are  nondiagnostic; options include Limited bone scan or referral to orthopedic foot specialist or podiatrist.

## 2013-04-25 NOTE — Patient Instructions (Signed)
Stop hydrochlorothiazide and monitor blood pressure; goal is minimal average less than 140/90. Order for x-rays entered into  the computer; these will be performed at [facility]. No appointment is necessary.

## 2013-04-27 ENCOUNTER — Encounter: Payer: Self-pay | Admitting: Internal Medicine

## 2013-04-27 LAB — URIC ACID: Uric Acid, Serum: 3.8 mg/dL (ref 2.4–7.0)

## 2013-04-28 ENCOUNTER — Encounter: Payer: Self-pay | Admitting: Internal Medicine

## 2013-04-28 ENCOUNTER — Telehealth: Payer: Self-pay | Admitting: Internal Medicine

## 2013-04-28 ENCOUNTER — Other Ambulatory Visit: Payer: Self-pay | Admitting: Internal Medicine

## 2013-04-28 DIAGNOSIS — M79671 Pain in right foot: Secondary | ICD-10-CM

## 2013-04-28 NOTE — Telephone Encounter (Signed)
Noted, patient recently sent a mychart message and in response to the Mychart message I instructed patient that if she feels as if she is dying she needs to seek medical attention ASAP in the emergency room. Patient was also informed that Mychart is a non-urgent messaging system and if she has an issue that needs medical attention she needs to call into the office   Dr.Hopper was verbally informed of this message and agrees that patient should see medical attention in the ER if she feels as though she is dying

## 2013-04-28 NOTE — Telephone Encounter (Signed)
Patient Information:  Caller Name: Mrs. Fenton Malling  Phone: [phone]  Patient: Ebony Garcia, Ebony Garcia  Gender: Female  DOB: 28-Jul-1951  Age: 62 Years  PCP: Marga Melnick  Office Follow Up:  Does the office need to follow up with this patient?: No  Instructions For The Office: N/A   Symptoms  Reason For Call & Symptoms: Pt's mother calling and states pt is ast her own home and said " Ifeel liike I'm dying".  Asked caller if she just wants to take her to the hospital, but she wants me to call pt.   I called pt  at [phone] and no answer.  Recalled mom so she could chceck on pt.  Reviewed Health History In EMR: N/A  Reviewed Medications In EMR: N/A  Reviewed Allergies In EMR: N/A  Reviewed Surgeries / Procedures: N/A  Date of Onset of Symptoms: Unknown  Guideline(s) Used:  No Protocol Available - Information Only  Disposition Per Guideline:   Home Care  Reason For Disposition Reached:   Information only question and nurse able to answer  Advice Given:  She needs to go check on her daughter  And either call us back or take herm to the ED if she is that sick.  RN Overrode Recommendation:  Document Patient  No answer at pt's number.

## 2013-04-29 ENCOUNTER — Emergency Department (HOSPITAL_BASED_OUTPATIENT_CLINIC_OR_DEPARTMENT_OTHER): Payer: Medicare Other

## 2013-04-29 ENCOUNTER — Inpatient Hospital Stay (HOSPITAL_BASED_OUTPATIENT_CLINIC_OR_DEPARTMENT_OTHER)
Admission: EM | Admit: 2013-04-29 | Discharge: 2013-05-02 | DRG: 293 | Disposition: A | Discharge status: Home / Self Care | Payer: Medicare Other | Attending: Internal Medicine | Admitting: Internal Medicine

## 2013-04-29 ENCOUNTER — Encounter: Payer: Self-pay | Admitting: Internal Medicine

## 2013-04-29 ENCOUNTER — Encounter (HOSPITAL_BASED_OUTPATIENT_CLINIC_OR_DEPARTMENT_OTHER): Payer: Self-pay | Admitting: Emergency Medicine

## 2013-04-29 DIAGNOSIS — M79672 Pain in left foot: Secondary | ICD-10-CM

## 2013-04-29 DIAGNOSIS — R0602 Shortness of breath: Secondary | ICD-10-CM | POA: Diagnosis present

## 2013-04-29 DIAGNOSIS — F329 Major depressive disorder, single episode, unspecified: Secondary | ICD-10-CM | POA: Diagnosis present

## 2013-04-29 DIAGNOSIS — T502X5A Adverse effect of carbonic-anhydrase inhibitors, benzothiadiazides and other diuretics, initial encounter: Secondary | ICD-10-CM | POA: Diagnosis present

## 2013-04-29 DIAGNOSIS — G43909 Migraine, unspecified, not intractable, without status migrainosus: Secondary | ICD-10-CM | POA: Diagnosis present

## 2013-04-29 DIAGNOSIS — R635 Abnormal weight gain: Secondary | ICD-10-CM

## 2013-04-29 DIAGNOSIS — M109 Gout, unspecified: Secondary | ICD-10-CM | POA: Diagnosis present

## 2013-04-29 DIAGNOSIS — I10 Essential (primary) hypertension: Secondary | ICD-10-CM

## 2013-04-29 DIAGNOSIS — I498 Other specified cardiac arrhythmias: Secondary | ICD-10-CM | POA: Diagnosis present

## 2013-04-29 DIAGNOSIS — F3289 Other specified depressive episodes: Secondary | ICD-10-CM | POA: Diagnosis not present

## 2013-04-29 DIAGNOSIS — R001 Bradycardia, unspecified: Secondary | ICD-10-CM | POA: Diagnosis present

## 2013-04-29 DIAGNOSIS — M79609 Pain in unspecified limb: Secondary | ICD-10-CM | POA: Diagnosis not present

## 2013-04-29 DIAGNOSIS — E876 Hypokalemia: Secondary | ICD-10-CM

## 2013-04-29 DIAGNOSIS — I509 Heart failure, unspecified: Secondary | ICD-10-CM

## 2013-04-29 DIAGNOSIS — E119 Type 2 diabetes mellitus without complications: Secondary | ICD-10-CM | POA: Diagnosis not present

## 2013-04-29 DIAGNOSIS — I5031 Acute diastolic (congestive) heart failure: Principal | ICD-10-CM

## 2013-04-29 DIAGNOSIS — J90 Pleural effusion, not elsewhere classified: Secondary | ICD-10-CM | POA: Diagnosis not present

## 2013-04-29 DIAGNOSIS — G43809 Other migraine, not intractable, without status migrainosus: Secondary | ICD-10-CM

## 2013-04-29 HISTORY — DX: Type 2 diabetes mellitus without complications: E11.9

## 2013-04-29 LAB — CBC WITH DIFFERENTIAL/PLATELET
Basophils Absolute: 0 10*3/uL (ref 0.0–0.1)
Basophils Relative: 1 % (ref 0–1)
Eosinophils Absolute: 0.2 10*3/uL (ref 0.0–0.7)
Eosinophils Relative: 2 % (ref 0–5)
HCT: 38 % (ref 36.0–46.0)
Hemoglobin: 12.7 g/dL (ref 12.0–15.0)
Lymphocytes Relative: 25 % (ref 12–46)
Lymphs Abs: 2.2 10*3/uL (ref 0.7–4.0)
MCH: 31.5 pg (ref 26.0–34.0)
MCHC: 33.4 g/dL (ref 30.0–36.0)
MCV: 94.3 fL (ref 78.0–100.0)
Monocytes Absolute: 0.5 10*3/uL (ref 0.1–1.0)
Monocytes Relative: 6 % (ref 3–12)
Neutro Abs: 5.9 10*3/uL (ref 1.7–7.7)
Neutrophils Relative %: 67 % (ref 43–77)
Platelets: 123 10*3/uL — ABNORMAL LOW (ref 150–400)
RBC: 4.03 MIL/uL (ref 3.87–5.11)
RDW: 13.5 % (ref 11.5–15.5)
WBC: 8.9 10*3/uL (ref 4.0–10.5)

## 2013-04-29 MED ORDER — OXYCODONE-ACETAMINOPHEN 5-325 MG PO TABS
2.0000 | ORAL_TABLET | Freq: Once | ORAL | Status: AC
  Administered 2013-04-29: 2 via ORAL
  Filled 2013-04-29 (×3): qty 2

## 2013-04-29 MED ORDER — DOXYCYCLINE HYCLATE 100 MG PO CAPS: 100.0000 mg | ORAL_CAPSULE | Freq: Two times a day (BID) | ORAL | Status: DC

## 2013-04-29 MED ORDER — OXYCODONE-ACETAMINOPHEN 5-325 MG PO TABS: 2.0000 | ORAL_TABLET | ORAL | Status: DC | PRN

## 2013-04-29 NOTE — ED Notes (Signed)
MD at bedside. 

## 2013-04-29 NOTE — ED Provider Notes (Signed)
Medical screening examination/treatment/procedure(s) were conducted as a shared visit with non-physician practitioner(s) and myself.  I personally evaluated the patient during the encounter.  This 62 year old female has many years of almost daily constant headaches sometimes severe every month she has severe headaches multiple hours at a time multiple days at a time and typically only has a few days a month where she has either no headache or mild headache, for the last few months her headache syndrome is gradually worsened, she also has several months of chronic fatigue chronic generalized weakness, she also has several months of daily chronic chest pain which can last for seconds to minutes or hours and is a vague chest pain syndrome, she also has several months of chronic daily palpitations she constantly feels as if her heart is either racing or skipping beats all day long, she is no sudden onset headache, she is no sudden onset chest pain, she has no pleuritic chest pain or exertional chest pain, she has no fever or confusion, she is no trauma, she is no change in speech vision swallowing or understanding, she has no focal or lateralizing weakness numbness or incoordination, she had to stop taking several of her headache medicines due to an echocardiogram scheduled for next week since one of her doctors apparently heard a heart murmur, she started taking indomethacin to help her headaches since she had to stop other medicines, however she states even though she's been on indomethacin for the last week or 2, now when she takes it causes coughing and wheezing and she's been coughing and wheezing the last 2 days so she stopped taking her indomethacin as she is no longer coughing tonight and not wheezing tonight and not short of breath tonight, she states she has gained about 4 pounds in the last week or 2 but does not have edema, she came to the emergency department because she needs short-term pain medicines to  last until she gets her echocardiogram and can resume taking her regular headache medicines, on examination her lungs are currently clear to auscultation unlabored no wheezes rales retractions or rhonchi or accessory muscle usage, her cardiac exam is regular rate and rhythm without audible murmur, she is no edema, her chest x-ray shows questionable slight increased interstitial markings at the bases with questionable slight effusions and since she has been coughing apparently wheezing the last 2 days but is now no longer coughing or wheezing has no fever she'll be started on oral antibiotics for questionable community acquired pneumonia, she will also had EKG obtained and labs checked out for clinically I suspect it is very unlikely that she has acute coronary syndrome, I anticipate she will be able to follow up on an outpatient basis as planned for her echocardiogram next week and advised her that her doctor consider cardiac stress testing as well, patient states she does not tolerate treadmill testing to her poor exercise tolerance and did not tolerate a chemical stress test either however she was made aware that if her doctor think she needs cardiac risk stratification she may potentially qualify for a cardiac CT angiogram does not think she is to be admitted tonight for that consideration unless her troponin result is abnormal.  Hurman Horn, MD 04/30/13 [phone]

## 2013-04-29 NOTE — ED Notes (Signed)
Received report from [employer]

## 2013-04-29 NOTE — ED Notes (Signed)
Pt has history of migraines, has headache today similar to past ones.  Pt also concerned about medication, indomethacin that she has been taking.  Pt states she has noticed wheezing after taking medication.  Pt also concerned about right foot pain.

## 2013-04-29 NOTE — ED Provider Notes (Signed)
History     CSN: [id number]  Arrival date & time 04/29/13  1811   None     Chief Complaint  Patient presents with  . Migraine  . Medication Reaction    (Consider location/radiation/quality/duration/timing/severity/associated sxs/prior treatment) Patient is a 62 y.o. female presenting with shortness of breath. The history is provided by the patient. No language interpreter was used.  Shortness of Breath Severity:  Moderate Onset quality:  Gradual Timing:  Constant Progression:  Worsening Chronicity:  New Relieved by:  Nothing Ineffective treatments:  None tried Associated symptoms: headaches    Pt reports she had a migraine earlier.  Pt reports she was wheezing yesterday and earlier today.  Pt's Mother reports she talked to pt on the phone and pt was wheezing loudly.  Pt reports she is on indocin for gout and she thinks it is causing her to be short of breath.  Pt reports she is suppose to have an echo/evaluation for murmur next week.  Pt has been told to stop her imitrex.  Past Medical History  Diagnosis Date  . Migraines   . Hypertension     Past Surgical History  Procedure Laterality Date  . Abdominal hysterectomy      BSO for fibroids and migraines  . Tonsillectomy and adenoidectomy    . Nasal septum surgery    . Tubal ligation    . Dilation and curettage of uterus      Family History  Problem Relation Age of Onset  . Crohn's disease Mother   . Atrial fibrillation Mother   . Cancer Father     bladder cancer and prostate cancer  . Bipolar disorder Sister   . Bipolar disorder Brother   . Heart attack Maternal Grandmother     >65  . Diabetes Neg Hx   . Stroke Neg Hx     History  Substance Use Topics  . Smoking status: Never Smoker   . Smokeless tobacco: Not on file  . Alcohol Use: No    OB History   Grav Para Term Preterm Abortions TAB SAB Ect Mult Living                  Review of Systems  Respiratory: Positive for shortness of breath.    Neurological: Positive for headaches.  All other systems reviewed and are negative.    Allergies  Amoxicillin-pot clavulanate; Hydrocodone-acetaminophen; Propoxyphene hcl; Propoxyphene-acetaminophen; and Sulfa antibiotics  Home Medications   Current Outpatient Rx  Name  Route  Sig  Dispense  Refill  . ALPRAZolam (XANAX XR) 0.5 MG 24 hr tablet   Oral   Take 0.5 mg by mouth every morning.           . benzoyl peroxide-erythromycin (BENZAMYCIN) gel   Topical   Apply topically 2 (two) times daily as needed.         . Estradiol (ELESTRIN) 0.52 MG/0.87 GM (0.06%) GEL   Topical   Apply 1 application topically. 2 PUMPS ONTO ARM ONCE DAILY         . FLUoxetine (PROZAC) 20 MG capsule   Oral   Take 20 mg by mouth daily.         . indomethacin (INDOCIN) 50 MG capsule   Oral   Take 1 capsule (50 mg total) by mouth 3 (three) times daily with meals.   21 capsule   0   . mometasone (ELOCON) 0.1 % ointment      Apply bid   45 g  0   . propranolol ER (INDERAL LA) 60 MG 24 hr capsule      TAKE 1 CAPSULE BY MOUTH EVERY 12 HOURS AS NEEDED FOR MIGRAINE PREVENTION AND RAPID HEART   180 capsule   1   . topiramate (TOPAMAX) 25 MG tablet      2 by mouth in the am, 2 by mouth in the pm   120 tablet   3     BP 138/64  Pulse 53  Temp(Src) 98.2 F (36.8 C)  Resp 16  Ht 5\' 2"  (1.575 m)  Wt 178 lb (80.74 kg)  BMI 32.55 kg/m2  SpO2 96%  Physical Exam  Nursing note and vitals reviewed. Constitutional: She is oriented to person, place, and time. She appears well-developed and well-nourished.  HENT:  Head: Normocephalic and atraumatic.  Eyes: Conjunctivae and EOM are normal. Pupils are equal, round, and reactive to light.  Neck: Normal range of motion. Neck supple.  Cardiovascular: Normal rate and regular rhythm.   Pulmonary/Chest: Effort normal and breath sounds normal.  Abdominal: Soft.  Musculoskeletal: Normal range of motion.  Neurological: She is alert and  oriented to person, place, and time. She has normal reflexes.  Skin: Skin is warm.  Psychiatric: She has a normal mood and affect.    ED Course  Procedures (including critical care time)  Labs Reviewed - No data to display No results found.   1. Bronchitis      Date: 04/29/2013  Rate: 50  Rhythm: sinus bradycardia  QRS Axis: normal  Intervals: normal  ST/T Wave abnormalities: normal  Conduction Disutrbances:none  Narrative Interpretation:   Old EKG Reviewed: none available   MDM  Chest xray shows trace effusions,  Lab evaluations pending.    Plan  rx for percocet and doxycycline,   See your Physician for recheck this week as scheduled        Elson Areas, PA-C 04/29/13 2327  Lonia Skinner [place], [place] 04/29/13 2338

## 2013-04-30 ENCOUNTER — Other Ambulatory Visit: Payer: Self-pay

## 2013-04-30 ENCOUNTER — Inpatient Hospital Stay (HOSPITAL_COMMUNITY): Payer: Medicare Other

## 2013-04-30 ENCOUNTER — Encounter (HOSPITAL_COMMUNITY): Payer: Self-pay | Admitting: Nurse Practitioner

## 2013-04-30 DIAGNOSIS — I509 Heart failure, unspecified: Secondary | ICD-10-CM

## 2013-04-30 DIAGNOSIS — M79609 Pain in unspecified limb: Secondary | ICD-10-CM

## 2013-04-30 DIAGNOSIS — I498 Other specified cardiac arrhythmias: Secondary | ICD-10-CM

## 2013-04-30 DIAGNOSIS — R001 Bradycardia, unspecified: Secondary | ICD-10-CM | POA: Diagnosis present

## 2013-04-30 DIAGNOSIS — R0602 Shortness of breath: Secondary | ICD-10-CM | POA: Diagnosis present

## 2013-04-30 DIAGNOSIS — G43809 Other migraine, not intractable, without status migrainosus: Secondary | ICD-10-CM

## 2013-04-30 DIAGNOSIS — I5031 Acute diastolic (congestive) heart failure: Secondary | ICD-10-CM

## 2013-04-30 DIAGNOSIS — M79672 Pain in left foot: Secondary | ICD-10-CM | POA: Diagnosis present

## 2013-04-30 DIAGNOSIS — I10 Essential (primary) hypertension: Secondary | ICD-10-CM

## 2013-04-30 HISTORY — DX: Bradycardia, unspecified: R00.1

## 2013-04-30 HISTORY — DX: Acute diastolic (congestive) heart failure: I50.31

## 2013-04-30 LAB — BASIC METABOLIC PANEL
BUN: 16 mg/dL (ref 6–23)
CO2: 22 mEq/L (ref 19–32)
Calcium: 8.8 mg/dL (ref 8.4–10.5)
Chloride: 107 mEq/L (ref 96–112)
Creatinine, Ser: 0.8 mg/dL (ref 0.50–1.10)
GFR calc Af Amer: >90 mL/min (ref >90)
GFR calc non Af Amer: 79 mL/min — ABNORMAL LOW (ref >90)
Glucose, Bld: 119 mg/dL — ABNORMAL HIGH (ref 70–99)
Potassium: 3.7 mEq/L (ref 3.5–5.1)
Sodium: 139 mEq/L (ref 135–145)

## 2013-04-30 LAB — PRO B NATRIURETIC PEPTIDE: Pro B Natriuretic peptide (BNP): 1528 pg/mL — ABNORMAL HIGH (ref 0–125)

## 2013-04-30 LAB — TROPONIN I: Troponin I: <0.3 ng/mL (ref <0.30)

## 2013-04-30 MED ORDER — LISINOPRIL 2.5 MG PO TABS
2.5000 mg | ORAL_TABLET | Freq: Every day | ORAL | Status: DC
  Administered 2013-04-30 – 2013-05-01 (×2): 2.5 mg via ORAL
  Filled 2013-04-30 (×3): qty 1

## 2013-04-30 MED ORDER — TRAMADOL HCL 50 MG PO TABS: 50.0000 mg | ORAL_TABLET | Freq: Four times a day (QID) | ORAL | Status: DC | PRN

## 2013-04-30 MED ORDER — ACETAMINOPHEN 325 MG PO TABS
650.0000 mg | ORAL_TABLET | ORAL | Status: DC | PRN
  Administered 2013-05-02: 650 mg via ORAL
  Filled 2013-04-30 (×2): qty 2

## 2013-04-30 MED ORDER — ASPIRIN EC 325 MG PO TBEC
325.0000 mg | DELAYED_RELEASE_TABLET | Freq: Every day | ORAL | Status: DC
  Administered 2013-04-30 – 2013-05-02 (×3): 325 mg via ORAL
  Filled 2013-04-30 (×3): qty 1

## 2013-04-30 MED ORDER — ALPRAZOLAM 0.25 MG PO TABS
0.2500 mg | ORAL_TABLET | Freq: Two times a day (BID) | ORAL | Status: DC
  Administered 2013-04-30 – 2013-05-01 (×3): 0.25 mg via ORAL
  Filled 2013-04-30 (×5): qty 1

## 2013-04-30 MED ORDER — SODIUM CHLORIDE 0.9 % IV SOLN
250.0000 mL | INTRAVENOUS | Status: DC | PRN
Start: 2013-04-30 — End: 2013-05-02

## 2013-04-30 MED ORDER — FUROSEMIDE 10 MG/ML IJ SOLN
40.0000 mg | Freq: Two times a day (BID) | INTRAMUSCULAR | Status: DC
  Administered 2013-04-30: 40 mg via INTRAVENOUS
  Filled 2013-04-30 (×2): qty 4

## 2013-04-30 MED ORDER — PROMETHAZINE HCL 25 MG/ML IJ SOLN
12.5000 mg | Freq: Once | INTRAMUSCULAR | Status: AC
  Administered 2013-04-30: 12.5 mg via INTRAVENOUS
  Filled 2013-04-30: qty 1

## 2013-04-30 MED ORDER — ENOXAPARIN SODIUM 40 MG/0.4ML ~~LOC~~ SOLN
40.0000 mg | SUBCUTANEOUS | Status: DC
  Administered 2013-04-30 – 2013-05-02 (×3): 40 mg via SUBCUTANEOUS
  Filled 2013-04-30 (×4): qty 0.4

## 2013-04-30 MED ORDER — SODIUM CHLORIDE 0.9 % IJ SOLN: 3.0000 mL | INTRAMUSCULAR | Status: DC | PRN

## 2013-04-30 MED ORDER — MORPHINE SULFATE 2 MG/ML IJ SOLN
2.0000 mg | INTRAMUSCULAR | Status: DC | PRN
  Administered 2013-04-30: 2 mg via INTRAVENOUS
  Filled 2013-04-30: qty 1

## 2013-04-30 MED ORDER — SODIUM CHLORIDE 0.9 % IJ SOLN
3.0000 mL | Freq: Two times a day (BID) | INTRAMUSCULAR | Status: DC
  Administered 2013-04-30 – 2013-05-02 (×5): 3 mL via INTRAVENOUS

## 2013-04-30 MED ORDER — ALPRAZOLAM ER 0.5 MG PO TB24: 0.5000 mg | ORAL_TABLET | ORAL | Status: DC

## 2013-04-30 MED ORDER — ZOLPIDEM TARTRATE 5 MG PO TABS: 5.0000 mg | ORAL_TABLET | Freq: Every evening | ORAL | Status: DC | PRN

## 2013-04-30 MED ORDER — DIPHENHYDRAMINE HCL 50 MG/ML IJ SOLN
12.5000 mg | Freq: Once | INTRAMUSCULAR | Status: AC
  Administered 2013-04-30: 12.5 mg via INTRAVENOUS
  Filled 2013-04-30: qty 1

## 2013-04-30 MED ORDER — FUROSEMIDE 10 MG/ML IJ SOLN
40.0000 mg | Freq: Once | INTRAMUSCULAR | Status: AC
  Administered 2013-04-30: 20 mg via INTRAVENOUS
  Filled 2013-04-30: qty 4

## 2013-04-30 MED ORDER — FLUOXETINE HCL 20 MG PO CAPS
20.0000 mg | ORAL_CAPSULE | Freq: Every day | ORAL | Status: DC
  Administered 2013-04-30 – 2013-05-02 (×3): 20 mg via ORAL
  Filled 2013-04-30 (×3): qty 1

## 2013-04-30 MED ORDER — ESTRADIOL 0.52 MG/0.87 GM (0.06%) TD GEL
2.0000 "application " | Freq: Every day | TRANSDERMAL | Status: DC
  Administered 2013-04-30 – 2013-05-02 (×3): 2 via TOPICAL
  Filled 2013-04-30 (×2): qty 2

## 2013-04-30 MED ORDER — FUROSEMIDE 10 MG/ML IJ SOLN
20.0000 mg | Freq: Two times a day (BID) | INTRAMUSCULAR | Status: DC
  Administered 2013-04-30 – 2013-05-01 (×2): 20 mg via INTRAVENOUS
  Filled 2013-04-30: qty 2

## 2013-04-30 MED ORDER — POTASSIUM CHLORIDE CRYS ER 20 MEQ PO TBCR
20.0000 meq | EXTENDED_RELEASE_TABLET | Freq: Two times a day (BID) | ORAL | Status: AC
  Administered 2013-04-30 – 2013-05-01 (×4): 20 meq via ORAL
  Filled 2013-04-30 (×4): qty 1

## 2013-04-30 MED ORDER — TOPIRAMATE 25 MG PO TABS
50.0000 mg | ORAL_TABLET | Freq: Two times a day (BID) | ORAL | Status: DC
  Administered 2013-04-30 – 2013-05-02 (×5): 50 mg via ORAL
  Filled 2013-04-30 (×6): qty 2

## 2013-04-30 MED ORDER — SUMATRIPTAN SUCCINATE 100 MG PO TABS
100.0000 mg | ORAL_TABLET | Freq: Two times a day (BID) | ORAL | Status: DC | PRN
  Administered 2013-04-30 – 2013-05-01 (×2): 100 mg via ORAL
  Filled 2013-04-30 (×3): qty 1

## 2013-04-30 MED ORDER — ONDANSETRON HCL 4 MG/2ML IJ SOLN: 4.0000 mg | Freq: Four times a day (QID) | INTRAMUSCULAR | Status: DC | PRN

## 2013-04-30 MED ORDER — MORPHINE SULFATE 2 MG/ML IJ SOLN
2.0000 mg | Freq: Once | INTRAMUSCULAR | Status: AC
  Administered 2013-04-30: 2 mg via INTRAVENOUS
  Filled 2013-04-30: qty 1

## 2013-04-30 MED ORDER — PROMETHAZINE HCL 25 MG/ML IJ SOLN
25.0000 mg | Freq: Four times a day (QID) | INTRAMUSCULAR | Status: DC | PRN
  Administered 2013-04-30: 25 mg via INTRAVENOUS
  Filled 2013-04-30: qty 1

## 2013-04-30 NOTE — Progress Notes (Signed)
Patient seen and examined. Currently no complaining of SOB. But with severe migraine. Patient admitted after midnight secondary to SOB and what appear to be heart failure exacerbation. Please referred to H&P done by Dr. Lovell Sheehan.  Plan: -will use phenergan and benadryl combination for migraine -will try to avoid narcotics -PRN imitrex -will continue lasix, low sodium and BP control -follow 2-D echo  Ebony Garcia [phone]

## 2013-04-30 NOTE — ED Notes (Signed)
[facility] called to transport to [facility].

## 2013-04-30 NOTE — ED Notes (Signed)
Report to [facility] RN [phone]

## 2013-04-30 NOTE — Progress Notes (Signed)
Patient reports chest pain and request percocet, 12 lead EKG has been performed and there are no EKG changes has been made.  MD notified and will come to unit to assess patient; will continue to monitor patient. Lorretta Harp RN

## 2013-04-30 NOTE — H&P (Signed)
[facility] History and Physical  Ebony Garcia ZOX:[id number] DOB: 05-Dec-1951 DOA: 04/29/2013  Referring physician: EDP PCP: Marga Melnick, MD  Specialists:   Chief Complaint: SOB and Wheezing  HPI: Ebony Garcia is a 62 y.o. female presented to the [facility] ED with complaints of sudden onset of loud wheezing and SOB since the afternoon.  She denies having chest pain.  She also has had increased pain in the skin of  Her right foot and has taken Indocin for presumed Gout, but her outpatient lab test for Uric Acid returned at a level of 3.0.   She reported only getting mild relief of pain in her  Right foot.  And she reports beginning to having increased pain in the ball of the left foot, and she his unable to bear weight on the left foot to walk.  In the Ed , she was found to have a BNP of 1528, and a chest x-ray revealing small bibasilar effusions and she was administered Lasix 80 mg IV x 1 dose and referred to [facility] for medical [facility].   She had Migraine headaches and takes topamax and propnaolol, and was found to have bradycardia with a pulse of 48 in the ED.  On arrival to the floor at [facility], she complained of beginning to have a migraine headache.    Review of Systems: The patient denies anorexia, fever, chills, weight loss, vision loss, decreased hearing, hoarseness, chest pain, syncope, dyspnea on exertion, peripheral edema, balance deficits, hemoptysis, abdominal pain, melena, hematochezia, severe indigestion/heartburn, hematuria, incontinence, genital sores, muscle weakness, suspicious skin lesions, transient blindness, difficulty walking, depression, unusual weight change, abnormal bleeding, enlarged lymph nodes, angioedema, and breast masses.    Past Medical History  Diagnosis Date  . Migraines   . Hypertension   . Diabetes mellitus without complication   . Murmur, cardiac    Past Surgical History  Procedure Laterality Date  . Abdominal hysterectomy      BSO  for fibroids and migraines  . Tonsillectomy and adenoidectomy    . Nasal septum surgery    . Tubal ligation    . Dilation and curettage of uterus      Medications:  HOME MEDS: Prior to Admission medications   Medication Sig Start Date End Date Taking? Authorizing Provider  ALPRAZolam (XANAX XR) 0.5 MG 24 hr tablet Take 0.5 mg by mouth every morning.      Historical Provider, MD  benzoyl peroxide-erythromycin (BENZAMYCIN) gel Apply topically 2 (two) times daily as needed. 12/08/12   Pecola Lawless, MD  doxycycline (VIBRAMYCIN) 100 MG capsule Take 1 capsule (100 mg total) by mouth 2 (two) times daily. 04/29/13   Elson Areas, PA-C  Estradiol (ELESTRIN) 0.52 MG/0.87 GM (0.06%) GEL Apply 1 application topically. 2 PUMPS ONTO ARM ONCE DAILY    Historical Provider, MD  FLUoxetine (PROZAC) 20 MG capsule Take 20 mg by mouth daily.    Historical Provider, MD  indomethacin (INDOCIN) 50 MG capsule Take 1 capsule (50 mg total) by mouth 3 (three) times daily with meals. 04/25/13   Pecola Lawless, MD  mometasone (ELOCON) 0.1 % ointment Apply bid 04/07/13   Pecola Lawless, MD  oxyCODONE-acetaminophen (PERCOCET/ROXICET) 5-325 MG per tablet Take 2 tablets by mouth every 4 (four) hours as needed. 04/29/13   Elson Areas, PA-C  propranolol ER (INDERAL LA) 60 MG 24 hr capsule TAKE 1 CAPSULE BY MOUTH EVERY 12 HOURS AS NEEDED FOR MIGRAINE PREVENTION AND RAPID HEART 02/07/13  Pecola Lawless, MD  topiramate (TOPAMAX) 25 MG tablet 2 by mouth in the am, 2 by mouth in the pm 04/03/13   Pecola Lawless, MD    Allergies:  Allergies  Allergen Reactions  . Amoxicillin-Pot Clavulanate     REACTION: DIARRHEA  . Hydrocodone-Acetaminophen     REACTION: sensitive to  but can take for extreme pain per patient  . Propoxyphene Hcl     REACTION: nausea  . Propoxyphene-Acetaminophen     REACTION: nausea  . Sulfa Antibiotics     Unknown     Social History:   reports that she has never smoked. She has never used  smokeless tobacco. She does not drink alcohol or use illicit drugs.  Family History: Family History  Problem Relation Age of Onset  . Crohn's disease Mother   . Atrial fibrillation Mother   . Cancer Father     bladder cancer and prostate cancer  . Bipolar disorder Sister   . Bipolar disorder Brother   . Heart attack Maternal Grandmother     >65  . Diabetes Neg Hx   . Stroke Neg Hx      Physical Exam:  GEN:  Pleasant  62 year old Obese well developed Caucasian Female examined  and in no acute distress; cooperative with exam Filed Vitals:   04/29/13 1833 04/29/13 2337 04/30/13 0325  BP: 138/64 149/83 146/82  Pulse: 53 55 54  Temp: 98.2 F (36.8 C)  97.2 F (36.2 C)  TempSrc:   Oral  Resp: 16  20  Height: 5\' 2"  (1.575 m)    Weight: 80.74 kg (178 lb)  82.283 kg (181 lb 6.4 oz)  SpO2: 96% 98% 96%   Blood pressure 146/82, pulse 54, temperature 97.2 F (36.2 C), temperature source Oral, resp. rate 20, height 5\' 2"  (1.575 m), weight 82.283 kg (181 lb 6.4 oz), SpO2 96.00%. PSYCH: She is alert and oriented x4; does not appear anxious does not appear depressed; affect is normal HEENT: Normocephalic and Atraumatic, Mucous membranes pink; PERRLA; EOM intact; Fundi:  Benign;  No scleral icterus, Nares: Patent, Oropharynx: Clear, Fair Dentition, Neck:  FROM, no cervical lymphadenopathy nor thyromegaly or carotid bruit; no JVD; Breasts:: Not examined CHEST WALL: No tenderness CHEST: Normal respiration, clear to auscultation bilaterally HEART: Regular rate and rhythm; no murmurs rubs or gallops BACK: No kyphosis or scoliosis; no CVA tenderness ABDOMEN: Positive Bowel Sounds,  Obese, soft non-tender; no masses, no organomegaly, no pannus; no intertriginous candida. Rectal Exam: Not done EXTREMITIES: No cyanosis, clubbing or edema; no ulcerations. Genitalia: not examined PULSES: 2+ and symmetric SKIN: Normal hydration no rash or ulceration CNS: Cranial nerves 2-12 grossly intact no  focal neurologic deficit    Labs & Imaging Results for orders placed during the hospital encounter of 04/29/13 (from the past 48 hour(s))  BASIC METABOLIC PANEL     Status: Abnormal   Collection Time    04/29/13 11:25 PM      Result Value Range   Sodium 139  135 - 145 mEq/L   Potassium 3.7  3.5 - 5.1 mEq/L   Chloride 107  96 - 112 mEq/L   CO2 22  19 - 32 mEq/L   Glucose, Bld 119 (*) 70 - 99 mg/dL   BUN 16  6 - 23 mg/dL   Creatinine, Ser 1.61  0.50 - 1.10 mg/dL   Calcium 8.8  8.4 - 09.6 mg/dL   GFR calc non Af Amer 79 (*) >90 mL/min  GFR calc Af Amer >90  >90 mL/min   Comment:            The eGFR has been calculated     using the CKD EPI equation.     This calculation has not been     validated in all clinical     situations.     eGFR's persistently     <90 mL/min signify     possible Chronic Kidney Disease.  CBC WITH DIFFERENTIAL     Status: Abnormal   Collection Time    04/29/13 11:25 PM      Result Value Range   WBC 8.9  4.0 - 10.5 K/uL   RBC 4.03  3.87 - 5.11 MIL/uL   Hemoglobin 12.7  12.0 - 15.0 g/dL   HCT 81.1  91.4 - 78.2 %   MCV 94.3  78.0 - 100.0 fL   MCH 31.5  26.0 - 34.0 pg   MCHC 33.4  30.0 - 36.0 g/dL   RDW 95.6  21.3 - 08.6 %   Platelets 123 (*) 150 - 400 K/uL   Neutrophils Relative % 67  43 - 77 %   Neutro Abs 5.9  1.7 - 7.7 K/uL   Lymphocytes Relative 25  12 - 46 %   Lymphs Abs 2.2  0.7 - 4.0 K/uL   Monocytes Relative 6  3 - 12 %   Monocytes Absolute 0.5  0.1 - 1.0 K/uL   Eosinophils Relative 2  0 - 5 %   Eosinophils Absolute 0.2  0.0 - 0.7 K/uL   Basophils Relative 1  0 - 1 %   Basophils Absolute 0.0  0.0 - 0.1 K/uL  PRO B NATRIURETIC PEPTIDE     Status: Abnormal   Collection Time    04/29/13 11:25 PM      Result Value Range   Pro B Natriuretic peptide (BNP) 1528.0 (*) 0 - 125 pg/mL  TROPONIN I     Status: None   Collection Time    04/29/13 11:25 PM      Result Value Range   Troponin I <0.30  <0.30 ng/mL   Comment:            Due to the  release kinetics of cTnI,     a negative result within the first hours     of the onset of symptoms does not rule out     myocardial infarction with certainty.     If myocardial infarction is still suspected,     repeat the test at appropriate intervals.    Radiological Exams on Admission: Dg Chest 2 View  04/29/2013   *RADIOLOGY REPORT*  Clinical Data: Wheezing  CHEST - 2 VIEW  Comparison: 03/07/2013  Findings: Diffusely mildly prominent interstitial lung markings are noted with mild cardiomegaly.  Trace pleural effusions are present. No focal pulmonary opacity.  No pneumothorax.  No acute osseous finding.  IMPRESSION: Trace effusions with cardiomegaly and mildly prominent interstitial markings which could indicate early edema, although atypical or viral infection/inflammation can have a similar appearance.   Original Report Authenticated By: Christiana Pellant, M.D.    EKG: Independently reviewed. Sinus Bradycardia at rate of 48,  Mild S-T depression in V1 and V2.     Assessment/Plan Principal Problem:   Acute diastolic CHF (congestive heart failure) Active Problems:   SOB (shortness of breath)   HYPERTENSION   Bradycardia   Pain in left foot    1.   Acute Diastolic CHF- CHF  protocol ordered , diurese with IV lasix,  Start Ace inhibitor Rx.  2-D ECHO ordered.  Discontinue NSAID (Indocin Rx).     2.   SOB due to #1, O2 PRN.    3.   HTN-  Lisinopril ordered, Hold Beta blocker (propanolol) due to Bradycardia, and monitor heart rate.     4.   Bradycardia-  Due to Beta Blocker RX, hold Propanolol for now.     5.   Pain in Left Foot-  X-rays of left Foot ordered,  May possible be a neuroma or ganglioma of the foot.      Code Status:  FULL CODE Family Communication:  No Family Present Disposition Plan:   Return to Home on discharge  Time spent: 87 Minutes  Ron Parker [facility] Pager [phone]  If 7PM-7AM, please contact night-coverage [email or web address] Password  [facility] [place] 04/30/2013, 4:26 AM

## 2013-04-30 NOTE — ED Notes (Signed)
EMS here to transport to [facility] [phone]

## 2013-04-30 NOTE — Progress Notes (Signed)
MD has assessed patient and orders given; will continue to monitor patient. Lorretta Harp RN

## 2013-04-30 NOTE — ED Notes (Signed)
MD at bedside for reassessment

## 2013-05-01 DIAGNOSIS — E876 Hypokalemia: Secondary | ICD-10-CM

## 2013-05-01 DIAGNOSIS — I509 Heart failure, unspecified: Secondary | ICD-10-CM

## 2013-05-01 DIAGNOSIS — F329 Major depressive disorder, single episode, unspecified: Secondary | ICD-10-CM

## 2013-05-01 LAB — BASIC METABOLIC PANEL
BUN: 18 mg/dL (ref 6–23)
CO2: 23 mEq/L (ref 19–32)
Calcium: 8.9 mg/dL (ref 8.4–10.5)
Chloride: 104 mEq/L (ref 96–112)
Creatinine, Ser: 0.94 mg/dL (ref 0.50–1.10)
GFR calc Af Amer: 75 mL/min — ABNORMAL LOW (ref >90)
GFR calc non Af Amer: 65 mL/min — ABNORMAL LOW (ref >90)
Glucose, Bld: 121 mg/dL — ABNORMAL HIGH (ref 70–99)
Potassium: 3.3 mEq/L — ABNORMAL LOW (ref 3.5–5.1)
Sodium: 139 mEq/L (ref 135–145)

## 2013-05-01 MED ORDER — FUROSEMIDE 40 MG PO TABS
40.0000 mg | ORAL_TABLET | Freq: Every day | ORAL | Status: DC
  Administered 2013-05-01 – 2013-05-02 (×2): 40 mg via ORAL
  Filled 2013-05-01 (×2): qty 1

## 2013-05-01 MED ORDER — POTASSIUM CHLORIDE CRYS ER 20 MEQ PO TBCR
40.0000 meq | EXTENDED_RELEASE_TABLET | Freq: Every day | ORAL | Status: DC
  Administered 2013-05-01 – 2013-05-02 (×2): 40 meq via ORAL
  Filled 2013-05-01 (×2): qty 2

## 2013-05-01 NOTE — Progress Notes (Signed)
Echocardiogram 2D Echocardiogram has been performed.  Ebony Garcia 05/01/2013, 9:14 AM

## 2013-05-01 NOTE — Care Management Note (Signed)
    Page 1 of 1   05/02/2013     10:34:13 AM   CARE MANAGEMENT NOTE 05/02/2013  Patient:  Ebony Garcia, Ebony Garcia   Account Number:  [id number]  Date Initiated:  05/01/2013  Documentation initiated by:  Ronny Flurry  Subjective/Objective Assessment:   62 y.o. female presented with complaints of sudden onset of loud wheezing and SOB     Action/Plan:   CHF protocol/ home with home health   Anticipated DC Date:  05/02/2013   Anticipated DC Plan:  HOME W HOME HEALTH SERVICES      DC Planning Services  CM consult      [facility] Choice  HOME HEALTH   Choice offered to / List presented to:  C-1 Patient        HH arranged  HH-1 RN      [facility] agency  [facility]   Status of service:  Completed, signed off Medicare Important Message given?   (If response is "NO", the following Medicare IM given date fields will be blank) Date Medicare IM given:   Date Additional Medicare IM given:    Discharge Disposition:    Per UR Regulation:    If discussed at Long Length of Stay Meetings, dates discussed:    Comments:  05/02/13 @ 0945.Marland KitchenMarland KitchenOletta Cohn, RN, BSN, [employer] [phone] Spoke with pt regarding discharge planning.  Pt offered [facility] agencies by Ronny Flurry, NCM.  NCM Camellia Wood followed up with pt and pt chose [facility].  Kizzie Furnish of [facility] notified. No DME needs at this time.

## 2013-05-01 NOTE — Progress Notes (Signed)
Brief Nutrition Note:   RD consulted for diet education. Pt asked for RD to come back at another time as she currently has a headache.   RD will f/u with diet education as able.   Clarene Duke RD, LDN Pager [phone] After Hours pager [phone]

## 2013-05-01 NOTE — Progress Notes (Signed)
[facility] PROGRESS NOTE  Ebony Garcia UJW:[id number] DOB: 16-Sep-1951 DOA: 04/29/2013 PCP: Marga Melnick, MD  Assessment/Plan: 1. SOB: thought to be secondary to diastolic heart failure. -2D echo pending (but done) -good response to lasix (5L neg approx) -will continue low sodium diet and BP control -lasix to be changed to PO today -will follow electrolytes and kidney function; most likely home tomorrow  2-HTN: well controlled with lisinopril  3-Bradycardia: due to use of propanolol. Resolved after stopping meds  4-migraine:continue PRN imitrex and tylenol for pain. -topamax for prevention  5-depression: continue prozac  6-hypokalemia:due to diuretics. Will replete and follow BMET in am.   FAO:ZHYQMVH  Code Status: Full Family Communication: none today Disposition Plan: home most likely tomorrow   Consultants:  none  Procedures:  2-D echo pending  Antibiotics:  none  HPI/Subjective: Patient feeling better; no CP or SOB. Reports HA is mild and that she got a good night sleep. 2-D echo done but pending; neg approx 5L; BP stable.  Objective: Filed Vitals:   04/30/13 1430 04/30/13 2010 05/01/13 0652 05/01/13 1017  BP: 120/81 143/72 129/78 93/57  Pulse: 67 68 68 56  Temp: 98.4 F (36.9 C) 99.2 F (37.3 C) 98.6 F (37 C) 97.5 F (36.4 C)  TempSrc: Oral Oral Oral Oral  Resp: 18 20 18 18   Height:      Weight:   79.7 kg (175 lb 11.3 oz)   SpO2: 99% 98% 96% 94%    Intake/Output Summary (Last 24 hours) at 05/01/13 1112 Last data filed at 05/01/13 1026  Gross per 24 hour  Intake    700 ml  Output   1425 ml  Net   -725 ml   Filed Weights   04/29/13 1833 04/30/13 0325 05/01/13 0652  Weight: 80.74 kg (178 lb) 82.283 kg (181 lb 6.4 oz) 79.7 kg (175 lb 11.3 oz)    Exam:   General:  Feeling better, no SOB  Cardiovascular: S1 and S2, soft murmur appreciated on exam; no rubs or gallops  Respiratory: CTA bilaterally  Abdomen: soft, NT, ND,  positive BS  Musculoskeletal: no edema, no cyanosis  Data Reviewed: Basic Metabolic Panel:  Recent Labs Lab 04/29/13 2325 05/01/13 0405  NA 139 139  K 3.7 3.3*  CL 107 104  CO2 22 23  GLUCOSE 119* 121*  BUN 16 18  CREATININE 0.80 0.94  CALCIUM 8.8 8.9   CBC:  Recent Labs Lab 04/29/13 2325  WBC 8.9  NEUTROABS 5.9  HGB 12.7  HCT 38.0  MCV 94.3  PLT 123*   Cardiac Enzymes:  Recent Labs Lab 04/29/13 2325  TROPONINI <0.30   BNP (last 3 results)  Recent Labs  04/29/13 2325  PROBNP 1528.0*    Studies: Dg Chest 2 View  04/29/2013   *RADIOLOGY REPORT*  Clinical Data: Wheezing  CHEST - 2 VIEW  Comparison: 03/07/2013  Findings: Diffusely mildly prominent interstitial lung markings are noted with mild cardiomegaly.  Trace pleural effusions are present. No focal pulmonary opacity.  No pneumothorax.  No acute osseous finding.  IMPRESSION: Trace effusions with cardiomegaly and mildly prominent interstitial markings which could indicate early edema, although atypical or viral infection/inflammation can have a similar appearance.   Original Report Authenticated By: Christiana Pellant, M.D.   Dg Foot Complete Left  04/30/2013   *RADIOLOGY REPORT*  Clinical Data: Severe plantar foot pain  LEFT FOOT - COMPLETE 3+ VIEW  Comparison: None.  Findings: No fracture or dislocation is seen.  The joint spaces  are preserved.  The visualized soft tissues are unremarkable.  IMPRESSION: No fracture or dislocation is seen.   Original Report Authenticated By: Charline Bills, M.D.    Scheduled Meds: . ALPRAZolam  0.25 mg Oral BID  . aspirin EC  325 mg Oral Daily  . enoxaparin (LOVENOX) injection  40 mg Subcutaneous Q24H  . Estradiol  2 application Topical Daily  . FLUoxetine  20 mg Oral Daily  . furosemide  20 mg Intravenous Q12H  . lisinopril  2.5 mg Oral Daily  . potassium chloride  20 mEq Oral BID  . sodium chloride  3 mL Intravenous Q12H  . topiramate  50 mg Oral BID   Continuous  Infusions:   Principal Problem:   Acute diastolic CHF (congestive heart failure) Active Problems:   HYPERTENSION   SOB (shortness of breath)   Bradycardia   Pain in left foot    Time spent: >30 minutes    Wilber Fini  [facility] Pager [phone]. If 7PM-7AM, please contact night-coverage at [email or web address], password [facility] 05/01/2013, 11:12 AM  LOS: 2 days

## 2013-05-02 ENCOUNTER — Other Ambulatory Visit (HOSPITAL_COMMUNITY): Payer: Medicare Other

## 2013-05-02 DIAGNOSIS — R635 Abnormal weight gain: Secondary | ICD-10-CM

## 2013-05-02 LAB — BASIC METABOLIC PANEL
BUN: 24 mg/dL — ABNORMAL HIGH (ref 6–23)
CO2: 26 mEq/L (ref 19–32)
Calcium: 9.1 mg/dL (ref 8.4–10.5)
Chloride: 106 mEq/L (ref 96–112)
Creatinine, Ser: 0.9 mg/dL (ref 0.50–1.10)
GFR calc Af Amer: 80 mL/min — ABNORMAL LOW (ref >90)
GFR calc non Af Amer: 69 mL/min — ABNORMAL LOW (ref >90)
Glucose, Bld: 103 mg/dL — ABNORMAL HIGH (ref 70–99)
Potassium: 3.7 mEq/L (ref 3.5–5.1)
Sodium: 142 mEq/L (ref 135–145)

## 2013-05-02 MED ORDER — POTASSIUM CHLORIDE CRYS ER 20 MEQ PO TBCR: 20.0000 meq | EXTENDED_RELEASE_TABLET | Freq: Every day | ORAL | Status: DC

## 2013-05-02 MED ORDER — FUROSEMIDE 20 MG PO TABS: 20.0000 mg | ORAL_TABLET | Freq: Every day | ORAL | Status: DC

## 2013-05-02 MED ORDER — LISINOPRIL 2.5 MG PO TABS: 2.5000 mg | ORAL_TABLET | Freq: Every day | ORAL | Status: DC

## 2013-05-02 NOTE — Telephone Encounter (Signed)
Noted, picked random Monday in May to document pap date for patient not sure which Monday she had this done

## 2013-05-02 NOTE — Progress Notes (Signed)
Utilization Review Completed Steed Kanaan J. Rito Lecomte, RN, BSN, NCM [phone]  

## 2013-05-02 NOTE — Discharge Summary (Signed)
Physician Discharge Summary  Starlynn T Congo WUX:[id number] DOB: 04-Jul-1951 DOA: 04/29/2013  PCP: Marga Melnick, MD  Admit date: 04/29/2013 Discharge date: 05/02/2013  Time spent: >30 minutes  Recommendations for Outpatient Follow-up:  1. BMET to follow electrolytes and kidney function 2. Reassess BP and adjust meds as needed  Discharge Diagnoses:  Principal Problem:   Acute diastolic CHF (congestive heart failure) Active Problems:   HYPERTENSION   SOB (shortness of breath)   Bradycardia   Pain in left foot   Discharge Condition: stable and improved. Discharged home and will follow with PCP in 10 days  Diet recommendation: low sodium diet  Filed Weights   04/30/13 0325 05/01/13 0652 05/02/13 0703  Weight: 82.283 kg (181 lb 6.4 oz) 79.7 kg (175 lb 11.3 oz) 79.017 kg (174 lb 3.2 oz)    History of present illness:  62 y.o. female presented to the [facility] ED with complaints of sudden onset of loud wheezing and SOB since the afternoon. She denies having chest pain. She also has had increased pain in the skin of Her right foot and has taken Indocin for presumed Gout, but her outpatient lab test for Uric Acid returned at a level of 3.0. She reported only getting mild relief of pain in her Right foot. And she reports beginning to having increased pain in the ball of the left foot, and she his unable to bear weight on the left foot to walk. In the Ed , she was found to have a BNP of 1528, and a chest x-ray revealing small bibasilar effusions and she was administered Lasix 80 mg IV x 1 dose and referred to [facility] for medical admission. She had Migraine headaches and takes topamax and propnaolol, and was found to have bradycardia with a pulse of 48 in the ED. On arrival to the floor at [facility], she complained of beginning to have a migraine headache.    Hospital Course:  1.SOB: thought to be secondary to diastolic heart failure. -2D echo (essentially normal; but with mild LV muscle  thickening after reviewing with cardiology); she mentioned recently removed from HCTZ treatment and on arrival to ED BP was elevated. -at this moment she diuresis and respond wonderful to treatment of lasix and lisinopril for BP -will discharge on low dose of this medications and instruction for low sodium diet -follow up with PCP to adjust medications as needed. -CE'z neg X 3  2-HTN: well controlled with lisinopril and lasix  3-Bradycardia: due to use of propanolol. Resolved after stopping meds   4-migraine:continue PRN imitrex and tylenol for pain.  -topamax for prevention   5-depression: continue prozac   6-hypokalemia:due to diuretics. repleted.    Procedures: See below for x-ray reports  2-De cho (preserved EF, no wall motion abnormalities; no valves abnormalities and just mild thickness on her LV muscles)  Consultations:  none  Discharge Exam: Filed Vitals:   05/01/13 1017 05/01/13 1315 05/01/13 1926 05/02/13 0703  BP: 93/57 127/65 91/51 103/61  Pulse: 56 53 56 57  Temp: 97.5 F (36.4 C) 97.9 F (36.6 C) 97.8 F (36.6 C) 97.1 F (36.2 C)  TempSrc: Oral Oral Oral Oral  Resp: 18 18 18 18   Height:      Weight:    79.017 kg (174 lb 3.2 oz)  SpO2: 94% 98% 96% 98%   General: Feeling better, no SOB Cardiovascular: S1 and S2, initially soft murmur appreciated no longer present after diuresis; no rubs or gallops  Respiratory: CTA bilaterally  Abdomen:  soft, NT, ND, positive BS  Musculoskeletal: no edema, no cyanosis   Discharge Instructions  Discharge Orders   Future Orders Complete By Expires     Diet - low sodium heart healthy  As directed     Discharge instructions  As directed     Comments:      Take medications as prescribed Follow a low sodium diet Arrange follow up with PCP in 7-10 days Keep yourself hydrated (if unable to eat or drink for any reason, hold lasix and lisinopril during that time)        Medication List    STOP taking these  medications       indomethacin 50 MG capsule  Commonly known as:  INDOCIN     propranolol ER 60 MG 24 hr capsule  Commonly known as:  INDERAL LA      TAKE these medications       ALPRAZolam 0.5 MG 24 hr tablet  Commonly known as:  XANAX XR  Take 0.5 mg by mouth at bedtime.     benzoyl peroxide-erythromycin gel  Commonly known as:  BENZAMYCIN  Apply topically 2 (two) times daily as needed.     Estradiol 0.52 MG/0.87 GM (0.06%) Gel  Apply 1 application topically daily. 1 pump     FLUoxetine 20 MG capsule  Commonly known as:  PROZAC  Take 20 mg by mouth daily.     furosemide 20 MG tablet  Commonly known as:  LASIX  Take 1 tablet (20 mg total) by mouth daily.     lisinopril 2.5 MG tablet  Commonly known as:  PRINIVIL,ZESTRIL  Take 1 tablet (2.5 mg total) by mouth daily.     potassium chloride SA 20 MEQ tablet  Commonly known as:  K-DUR,KLOR-CON  Take 1 tablet (20 mEq total) by mouth daily.     topiramate 25 MG tablet  Commonly known as:  TOPAMAX  Take 50 mg by mouth 2 (two) times daily.       Allergies  Allergen Reactions  . Amoxicillin-Pot Clavulanate     REACTION: DIARRHEA  . Hydrocodone-Acetaminophen     REACTION: sensitive to  but can take for extreme pain per patient  . Propoxyphene Hcl     REACTION: nausea  . Propoxyphene-Acetaminophen     REACTION: nausea  . Sulfa Antibiotics     Unknown        Follow-up Information   Follow up with Marga Melnick, MD. Schedule an appointment as soon as possible for a visit in 10 days.   Contact information:   [address] [facility] [address] [place] [address] [phone]       The results of significant diagnostics from this hospitalization (including imaging, microbiology, ancillary and laboratory) are listed below for reference.    Significant Diagnostic Studies: Dg Chest 2 View  04/29/2013   *RADIOLOGY REPORT*  Clinical Data: Wheezing  CHEST - 2 VIEW  Comparison: 03/07/2013  Findings:  Diffusely mildly prominent interstitial lung markings are noted with mild cardiomegaly.  Trace pleural effusions are present. No focal pulmonary opacity.  No pneumothorax.  No acute osseous finding.  IMPRESSION: Trace effusions with cardiomegaly and mildly prominent interstitial markings which could indicate early edema, although atypical or viral infection/inflammation can have a similar appearance.   Original Report Authenticated By: Christiana Pellant, M.D.   Dg Foot Complete Left  04/30/2013   *RADIOLOGY REPORT*  Clinical Data: Severe plantar foot pain  LEFT FOOT - COMPLETE 3+ VIEW  Comparison: None.  Findings: No fracture or dislocation is seen.  The joint spaces are preserved.  The visualized soft tissues are unremarkable.  IMPRESSION: No fracture or dislocation is seen.   Original Report Authenticated By: Charline Bills, M.D.   Dg Foot Complete Right  04/25/2013   *RADIOLOGY REPORT*  Clinical Data: Pain  RIGHT FOOT COMPLETE - 3+ VIEW  Comparison: None.  Findings:  Frontal, oblique, and lateral views were obtained. There is no fracture or dislocation.  Joint spaces appear intact.  No erosive change.  IMPRESSION: No abnormality noted.   Original Report Authenticated By: Bretta Bang, M.D.   Mm Digital Diagnostic Bilat  04/14/2013   *RADIOLOGY REPORT*  Clinical Data:  Three areas of bruising recently in the upper inner left breast, associated with warmth, itching and pain in the breast.  These symptoms have subsequently completely resolved.  No palpable abnormality.  DIGITAL DIAGNOSTIC BILATERAL MAMMOGRAM WITH CAD  Comparison: Previous examinations.  Findings:  ACR Breast Density Category 3: The breast tissue is heterogeneously dense.  Stable mammographic appearance of the breasts with no findings suspicious for malignancy.  Mammographic images were processed with CAD.  IMPRESSION: No evidence of malignancy.  RECOMMENDATION: Bilateral screening mammogram in 1 year.  I have discussed the findings and  recommendations with the patient. Results were also provided in writing at the conclusion of the visit.  If applicable, a reminder letter will be sent to the patient regarding her next appointment.  BI-RADS CATEGORY 1:  Negative.   Original Report Authenticated By: Beckie Salts, M.D.   Labs: Basic Metabolic Panel:  Recent Labs Lab 04/29/13 2325 05/01/13 0405 05/02/13 0605  NA 139 139 142  K 3.7 3.3* 3.7  CL 107 104 106  CO2 22 23 26   GLUCOSE 119* 121* 103*  BUN 16 18 24*  CREATININE 0.80 0.94 0.90  CALCIUM 8.8 8.9 9.1   CBC:  Recent Labs Lab 04/29/13 2325  WBC 8.9  NEUTROABS 5.9  HGB 12.7  HCT 38.0  MCV 94.3  PLT 123*   Cardiac Enzymes:  Recent Labs Lab 04/29/13 2325  TROPONINI <0.30   BNP: BNP (last 3 results)  Recent Labs  04/29/13 2325  PROBNP 1528.0*    Signed:  Mathieu Schloemer  [facility] 05/02/2013, 10:43 AM

## 2013-05-02 NOTE — Plan of Care (Signed)
Problem: Food- and Nutrition-Related Knowledge Deficit (NB-1.1) Goal: Nutrition education Formal process to instruct or train a patient/client in a skill or to impart knowledge to help patients/clients voluntarily manage or modify food choices and eating behavior to maintain or improve health.  Outcome: Completed/Met Date Met:  05/02/13 Nutrition Education Note  RD consulted for nutrition education regarding new onset CHF.  RD provided "Low Sodium Nutrition Therapy" handout from the Academy of [employer] and Dietetics. Reviewed patient's dietary recall. Provided examples on ways to decrease sodium intake in diet. Discouraged intake of processed foods and use of salt shaker. Encouraged fresh fruits and vegetables as well as whole grain sources of carbohydrates to maximize fiber intake.   RD discussed why it is important for patient to adhere to diet recommendations, and emphasized the role of fluids, foods to avoid, and importance of weighing self daily. Teach back method used.  Expect fair compliance.  Body mass index is 31.85 kg/(m^2). Pt meets criteria for obesity class 1 based on current BMI.  Current diet order is Heart, patient is consuming approximately 100% of meals at this time. Labs and medications reviewed. No further nutrition interventions warranted at this time. RD contact information provided. If additional nutrition issues arise, please re-consult RD.   Clarene Duke RD, LDN Pager [phone] After Hours pager [phone]

## 2013-05-04 DIAGNOSIS — I10 Essential (primary) hypertension: Secondary | ICD-10-CM | POA: Diagnosis not present

## 2013-05-04 DIAGNOSIS — I509 Heart failure, unspecified: Secondary | ICD-10-CM | POA: Diagnosis not present

## 2013-05-04 DIAGNOSIS — E119 Type 2 diabetes mellitus without complications: Secondary | ICD-10-CM | POA: Diagnosis not present

## 2013-05-06 ENCOUNTER — Encounter: Payer: Self-pay | Admitting: Internal Medicine

## 2013-05-08 ENCOUNTER — Telehealth: Payer: Self-pay | Admitting: Internal Medicine

## 2013-05-08 DIAGNOSIS — E119 Type 2 diabetes mellitus without complications: Secondary | ICD-10-CM | POA: Diagnosis not present

## 2013-05-08 DIAGNOSIS — I509 Heart failure, unspecified: Secondary | ICD-10-CM | POA: Diagnosis not present

## 2013-05-08 DIAGNOSIS — I10 Essential (primary) hypertension: Secondary | ICD-10-CM | POA: Diagnosis not present

## 2013-05-08 NOTE — Telephone Encounter (Signed)
FYI... In reference to Podiatry referral entered 04/28/13, per Herbert Seta at [facility], she contacted patient who had just been discharged from the hospital for CHF, and stated after getting out of hospital her feet feel better, and she declined to schedule.

## 2013-05-10 ENCOUNTER — Encounter: Payer: Self-pay | Admitting: Internal Medicine

## 2013-05-10 ENCOUNTER — Ambulatory Visit (INDEPENDENT_AMBULATORY_CARE_PROVIDER_SITE_OTHER): Payer: Medicare Other | Admitting: Internal Medicine

## 2013-05-10 VITALS — BP 118/66 | HR 84 | Temp 98.7°F | Wt 173.0 lb

## 2013-05-10 DIAGNOSIS — I5031 Acute diastolic (congestive) heart failure: Secondary | ICD-10-CM | POA: Diagnosis not present

## 2013-05-10 DIAGNOSIS — I509 Heart failure, unspecified: Secondary | ICD-10-CM | POA: Diagnosis not present

## 2013-05-10 NOTE — Assessment & Plan Note (Addendum)
Recheck BMET and BNP  Continue low-salt diet  Continue low-dose furosemide  Cardiology assessment; rule out Takayasu's syndrome ( "broken heart disease")

## 2013-05-10 NOTE — Patient Instructions (Addendum)
Please weigh on the same scales every Monday; report gain of 5 pounds or more.    05/16/13 Home Health Certification  & Plan of Care  For 5/29-7/27/14  reviewed & completed. No discrepancies noted between EMR Med List & meds listed on form .

## 2013-05-10 NOTE — Progress Notes (Signed)
  Subjective:    Patient ID: Ebony Garcia, female    DOB: [birth year], 62 y.o.   MRN: [id number]  HPI  The hospital records 5/24-5/27/14 were reviewed. She had acute diastolic heart failure with a remarkably elevated BNP of 1528; cardiomegaly with trace effusions and vascular interstitial accentuation on chest x-ray; and normal echocardiogram. No definite etiology of this acute decompensation was obvious.  She had been under a great deal of stress as her ex-husband had been calling her up to 60 times a day. She describes herself as "being brokenhearted"  She had been on Indocin for foot pain which was discontinued when the uric acid was found to be less than 5. She denied any signs of active infection such as fever, chills, or sweats.  She lost 10 pounds during hospitalization with low-dose furosemide.    Review of Systems She is on a low salt diet; she is not exercising yet.  Specifically she denies significant chest pain, palpitations, dyspnea, PND or claudication. Family history is negative for premature heart disease.   X-rays of the foot were normal; it has resolved.  Additionally  migraine headaches are resolved.     Objective:   Physical Exam Appears healthy and well-nourished & in no acute distress  No carotid bruits are present.No neck pain distention present at 10 - 15 degrees. Thyroid normal to palpation  Heart rhythm and rate are normal with no significant murmurs or gallops. Accentuated  S1; grade 1 systolic murmur at the base  Chest is clear with no increased work of breathing  There is no evidence of aortic aneurysm or renal artery bruits  Abdomen soft with no organomegaly or masses. No HJR  No clubbing, cyanosis or edema present.  Pedal pulses are intact but decreased  No ischemic skin changes are present . Nails healthy    Alert and oriented. Strength, tone, DTRs reflexes normal          Assessment & Plan:  See Current Assessment & Plan in  Problem List under specific Diagnosis

## 2013-05-11 ENCOUNTER — Encounter: Payer: Self-pay | Admitting: Internal Medicine

## 2013-05-11 DIAGNOSIS — E119 Type 2 diabetes mellitus without complications: Secondary | ICD-10-CM | POA: Diagnosis not present

## 2013-05-11 DIAGNOSIS — I509 Heart failure, unspecified: Secondary | ICD-10-CM | POA: Diagnosis not present

## 2013-05-11 DIAGNOSIS — I10 Essential (primary) hypertension: Secondary | ICD-10-CM | POA: Diagnosis not present

## 2013-05-11 LAB — BASIC METABOLIC PANEL
BUN: 12 mg/dL (ref 6–23)
CO2: 24 mEq/L (ref 19–32)
Calcium: 9.6 mg/dL (ref 8.4–10.5)
Chloride: 108 mEq/L (ref 96–112)
Creatinine, Ser: 0.8 mg/dL (ref 0.4–1.2)
GFR: 75.68 mL/min (ref >60.00)
Glucose, Bld: 90 mg/dL (ref 70–99)
Potassium: 4 mEq/L (ref 3.5–5.1)
Sodium: 142 mEq/L (ref 135–145)

## 2013-05-11 LAB — BRAIN NATRIURETIC PEPTIDE: Pro B Natriuretic peptide (BNP): 40 pg/mL (ref 0.0–100.0)

## 2013-05-12 ENCOUNTER — Encounter: Payer: Self-pay | Admitting: Internal Medicine

## 2013-05-13 ENCOUNTER — Encounter: Payer: Self-pay | Admitting: Internal Medicine

## 2013-05-16 ENCOUNTER — Telehealth: Payer: Self-pay | Admitting: Internal Medicine

## 2013-05-16 DIAGNOSIS — R079 Chest pain, unspecified: Secondary | ICD-10-CM

## 2013-05-16 DIAGNOSIS — I10 Essential (primary) hypertension: Secondary | ICD-10-CM | POA: Diagnosis not present

## 2013-05-16 DIAGNOSIS — E119 Type 2 diabetes mellitus without complications: Secondary | ICD-10-CM | POA: Diagnosis not present

## 2013-05-16 DIAGNOSIS — I509 Heart failure, unspecified: Secondary | ICD-10-CM | POA: Diagnosis not present

## 2013-05-16 NOTE — Telephone Encounter (Signed)
Please advise if we could possibly schedule pt with [facility] sooner?

## 2013-05-16 NOTE — Telephone Encounter (Signed)
Patient states her home nurse, Selena Batten, has recommended that she see Dr. Antoine Poche sooner than currently scheduled if possible. Patient states she is coughing frequently and sometimes has to remove her bra due to her achy chest. Her RN states she wants to rule out pericarditis and that all pts vitals are good. Patient also states she has lost 4.2 lbs in the last week so she thinks the fluid pills are working. Patient would like [place] to call Dr. Jenene Slicker office and try to get her a sooner appt.

## 2013-05-17 ENCOUNTER — Ambulatory Visit (HOSPITAL_BASED_OUTPATIENT_CLINIC_OR_DEPARTMENT_OTHER)
Admission: RE | Admit: 2013-05-17 | Discharge: 2013-05-17 | Disposition: A | Discharge status: Home / Self Care | Payer: Medicare Other | Source: Ambulatory Visit | Attending: Internal Medicine | Admitting: Internal Medicine

## 2013-05-17 ENCOUNTER — Encounter: Payer: Self-pay | Admitting: Internal Medicine

## 2013-05-17 DIAGNOSIS — R079 Chest pain, unspecified: Secondary | ICD-10-CM

## 2013-05-17 NOTE — Telephone Encounter (Signed)
To assess present symptoms; she should have a chest x-ray and followup visit here. I may repeat the EKG at that time. Based on those findings I would discuss pursuing earlier appointment if indicated.

## 2013-05-17 NOTE — Telephone Encounter (Signed)
Hopp please advise if you agree that pt should be seen sooner. If so, I will call and speak with the Doc of the day.

## 2013-05-17 NOTE — Telephone Encounter (Signed)
When I call [facility] to schedule patient's for an appointment, they are always given the first available.  If they are new, of course it would not be as soon, but even if they are established their office is now booking into August.  For patient to be seen sooner, [facility] requires the PCP to call and speak with the Doc of the Day at [phone].

## 2013-05-17 NOTE — Telephone Encounter (Signed)
Chest Xray scheduled for pt. LM notifying pt.

## 2013-05-18 DIAGNOSIS — E119 Type 2 diabetes mellitus without complications: Secondary | ICD-10-CM | POA: Diagnosis not present

## 2013-05-18 DIAGNOSIS — I509 Heart failure, unspecified: Secondary | ICD-10-CM | POA: Diagnosis not present

## 2013-05-18 DIAGNOSIS — I10 Essential (primary) hypertension: Secondary | ICD-10-CM | POA: Diagnosis not present

## 2013-05-19 ENCOUNTER — Encounter: Payer: Self-pay | Admitting: Internal Medicine

## 2013-05-23 ENCOUNTER — Encounter: Payer: Self-pay | Admitting: Family Medicine

## 2013-05-23 ENCOUNTER — Ambulatory Visit (INDEPENDENT_AMBULATORY_CARE_PROVIDER_SITE_OTHER): Payer: Medicare Other | Admitting: Family Medicine

## 2013-05-23 ENCOUNTER — Encounter: Payer: Self-pay | Admitting: Internal Medicine

## 2013-05-23 VITALS — BP 110/72 | HR 72 | Temp 98.2°F | Wt 170.0 lb

## 2013-05-23 DIAGNOSIS — F329 Major depressive disorder, single episode, unspecified: Secondary | ICD-10-CM | POA: Diagnosis not present

## 2013-05-23 DIAGNOSIS — M654 Radial styloid tenosynovitis [de Quervain]: Secondary | ICD-10-CM

## 2013-05-23 DIAGNOSIS — F3289 Other specified depressive episodes: Secondary | ICD-10-CM

## 2013-05-23 HISTORY — DX: Radial styloid tenosynovitis (de quervain): M65.4

## 2013-05-23 MED ORDER — PREDNISONE 10 MG PO TABS: ORAL_TABLET | ORAL | Status: DC

## 2013-05-23 NOTE — Patient Instructions (Signed)
De Quervain's Tenosynovitis  De Quervain's tenosynovitis involves inflammation of one or two tendon linings (sheaths) or strain of one or two tendons to the thumb: extensor pollicis brevis (EPB), or abductor pollicis longus (APL). This causes pain on the side of the wrist and base of the thumb. Tendon sheaths secrete a fluid that lubricates the tendon, allowing the tendon to move smoothly. When the sheath becomes inflamed, the tendon cannot move freely in the sheath. Both the EPB and APL tendons are important for proper use of the hand. The EPB tendon is important for straightening the thumb. The APL tendon is important for moving the thumb away from the index finger (abducting). The two tendons pass through a small tube (canal) in the wrist, near the base of the thumb. When the tendons become inflamed, pain is usually felt in this area.  SYMPTOMS   · Pain, tenderness, swelling, warmth, or redness over the base of the thumb and thumb side of the wrist.  · Pain that gets worse when straightening the thumb.  · Pain that gets worse when moving the thumb away from the index finger, against resistance.  · Pain with pinching or gripping.  · Locking or catching of the thumb.  · Limited motion of the thumb.  · Crackling sound (crepitation) when the tendon or thumb is moved or touched.  · Fluid-filled cyst in the area of the base of the thumb.  CAUSES   · Tenosynovitis is often linked with overuse of the wrist.  · Tenosynovitis may be caused by repeated injury to the thumb muscle and tendon units, and with repeated motions of the hand and wrist, due to friction of the tendon within the lining (sheath).  · Tenosynovitis may also be due to a sudden increase in activity or change in activity.  RISK INCREASES WITH:  · Sports that involve repeated hand and wrist motions (golf, bowling, tennis, squash, racquetball).  · Heavy labor.  · Poor physical wrist strength and flexibility.  · Failure to warm up properly before practice or  play.  · Female gender.  · New mothers who hold their baby's head for long periods or lift infants with thumbs in the infant's armpit (axilla).  PREVENTION  · Warm up and stretch properly before practice or competition.  · Allow enough time for rest and recovery between practices and competition.  · Maintain appropriate conditioning:  · Cardiovascular fitness.  · Forearm, wrist, and hand flexibility.  · Muscle strength and endurance.  · Use proper exercise technique.  PROGNOSIS   This condition is usually curable within 6 weeks, if treated properly with non-surgical treatment and resting of the affected area.   RELATED COMPLICATIONS   · Longer healing time if not properly treated or if not given enough time to heal.  · Chronic inflammation, causing recurring symptoms of tenosynovitis. Permanent pain or restriction of movement.  · Risks of surgery: infection, bleeding, injury to nerves (numbness of the thumb), continued pain, incomplete release of the tendon sheath, recurring symptoms, cutting of the tendons, tendons sliding out of position, weakness of the thumb, thumb stiffness.  TREATMENT   First, treatment involves the use of medicine and ice, to reduce pain and inflammation. Patients are encouraged to stop or modify activities that aggravate the injury. Stretching and strengthening exercises may be advised. Exercises may be completed at home or with a therapist. You may be fitted with a brace or splint, to limit motion and allow the injury to heal. Your caregiver   may also choose to give you a corticosteroid injection, to reduce the pain and inflammation. If non-surgical treatment is not successful, surgery may be needed. Most tenosynovitis surgeries are done as outpatient procedures (you go home the same day). Surgery may involve local, regional (whole arm), or general anesthesia.   MEDICATION   · If pain medicine is needed, nonsteroidal anti-inflammatory medicines (aspirin and ibuprofen), or other minor pain  relievers (acetaminophen), are often advised.  · Do not take pain medicine for 7 days before surgery.  · Prescription pain relievers are often prescribed only after surgery. Use only as directed and only as much as you need.  · Corticosteroid injections may be given if your caregiver thinks they are needed. There is a limited number of times these injections may be given.  COLD THERAPY   · Cold treatment (icing) should be applied for 10 to 15 minutes every 2 to 3 hours for inflammation and pain, and immediately after activity that aggravates your symptoms. Use ice packs or an ice massage.  SEEK MEDICAL CARE IF:   · Symptoms get worse or do not improve in 2 to 4 weeks, despite treatment.  · You experience pain, numbness, or coldness in the hand.  · Blue, gray, or dark color appears in the fingernails.  · Any of the following occur after surgery: increased pain, swelling, redness, drainage of fluids, bleeding in the affected area, or signs of infection.  · New, unexplained symptoms develop. (Drugs used in treatment may produce side effects.)  Document Released: 11/23/2005 Document Revised: 02/15/2012 Document Reviewed: 03/07/2009  Exit[employer]® Patient Information ©2014 [employer].

## 2013-05-23 NOTE — Assessment & Plan Note (Signed)
Pt is not currently in counseling She will talk to her pastor and will rto if she feels she need anything else

## 2013-05-23 NOTE — Progress Notes (Signed)
  Subjective:    Patient ID: Ebony Garcia, female    DOB: 04/02/1951, 62 y.o.   MRN: [id number]  HPI Pt here c/o L hand pain in thumb and wrist.  No pain in arm, no cp. No sob. Pt does seem depressed when she brought up her husband leaving her for another women and her divorce as well as her children dont give her time of day. Pt does not want counseling.  She just wants to talk to her pastor and his wife.       Review of Systems    as above Objective:   Physical Exam  BP 110/72  Pulse 72  Temp(Src) 98.2 F (36.8 C) (Oral)  Wt 170 lb (77.111 kg)  BMI 31.09 kg/m2  SpO2 98% General appearance: alert, cooperative, appears stated age and no distress Extremities: pain L hand--base L thumb, + swelling, no errythema Neurologic: Motor: normal strength in hands Psych-- teary eyed,  Not suicidal      Assessment & Plan:

## 2013-05-23 NOTE — Assessment & Plan Note (Signed)
Pt to get splint at pharmacy pred taper To hand surgeon if no relief

## 2013-05-24 DIAGNOSIS — I509 Heart failure, unspecified: Secondary | ICD-10-CM | POA: Diagnosis not present

## 2013-05-24 DIAGNOSIS — I10 Essential (primary) hypertension: Secondary | ICD-10-CM | POA: Diagnosis not present

## 2013-05-24 DIAGNOSIS — E119 Type 2 diabetes mellitus without complications: Secondary | ICD-10-CM | POA: Diagnosis not present

## 2013-05-25 ENCOUNTER — Encounter: Payer: Self-pay | Admitting: Family Medicine

## 2013-06-02 ENCOUNTER — Encounter: Payer: Self-pay | Admitting: Internal Medicine

## 2013-06-02 MED ORDER — LOSARTAN POTASSIUM 50 MG PO TABS: ORAL_TABLET | ORAL | Status: DC

## 2013-06-02 NOTE — Telephone Encounter (Signed)
Please advise. Thanks.  

## 2013-06-02 NOTE — Telephone Encounter (Signed)
Rx sent 

## 2013-06-06 ENCOUNTER — Encounter: Payer: Self-pay | Admitting: Internal Medicine

## 2013-06-06 DIAGNOSIS — E119 Type 2 diabetes mellitus without complications: Secondary | ICD-10-CM | POA: Diagnosis not present

## 2013-06-06 DIAGNOSIS — I509 Heart failure, unspecified: Secondary | ICD-10-CM | POA: Diagnosis not present

## 2013-06-06 DIAGNOSIS — I10 Essential (primary) hypertension: Secondary | ICD-10-CM | POA: Diagnosis not present

## 2013-06-08 ENCOUNTER — Ambulatory Visit (INDEPENDENT_AMBULATORY_CARE_PROVIDER_SITE_OTHER): Payer: Medicare Other | Admitting: Internal Medicine

## 2013-06-08 ENCOUNTER — Encounter: Payer: Self-pay | Admitting: Internal Medicine

## 2013-06-08 VITALS — BP 134/96 | HR 86 | Resp 12 | Wt 166.0 lb

## 2013-06-08 DIAGNOSIS — R0789 Other chest pain: Secondary | ICD-10-CM | POA: Diagnosis not present

## 2013-06-08 DIAGNOSIS — R002 Palpitations: Secondary | ICD-10-CM | POA: Diagnosis not present

## 2013-06-08 DIAGNOSIS — R51 Headache: Secondary | ICD-10-CM | POA: Diagnosis not present

## 2013-06-08 DIAGNOSIS — R609 Edema, unspecified: Secondary | ICD-10-CM

## 2013-06-08 DIAGNOSIS — R519 Headache, unspecified: Secondary | ICD-10-CM

## 2013-06-08 DIAGNOSIS — I10 Essential (primary) hypertension: Secondary | ICD-10-CM

## 2013-06-08 MED ORDER — LOSARTAN POTASSIUM 50 MG PO TABS: ORAL_TABLET | ORAL | Status: DC

## 2013-06-08 NOTE — Progress Notes (Signed)
  Subjective:    Patient ID: Ebony Garcia, female    DOB: Apr 23, 1951, 62 y.o.   MRN: [id number]  HPI  She presents with several concerns. She's been having substernal heaviness which can occur at rest and last hours. This is nonradiating and not associated with nausea or sweating. There no definite triggers or relievers for this.  She describes some intermittent palpitations. She describes edema as tightness of her rings and fullness in the abdomen. Additionally she describes diffuse headaches which are variable from 18-from being. These can also last hours. Tylenol has been of benefit. These headaches do not mimic her prior migraines  Her blood pressure at home is ranged from 112/72-133/91. Patient is compliant with medications.No adverse effects noted from medication; lisinopril caused restless, irregular sleep pattern. This resolved after this was discontinued. Exercise program as walking > 4  times per week for 60 minutes w/o chest pain. On heart healthy  low sugar, low fat, low salt diet    With this constellation of symptoms she has not had associated flushing or diarrhea.     Review of Systems   No   dyspnea, claudication or paroxysmal nocturnal dyspnea described  Minimal insignificant lightheadedness .No epistaxis or syncope.  She is wearing a soft brace over the left wrist for possible tendinitis/arthritis. This has been evaluated by her father who is a Land.          Objective:   Physical Exam  Appears healthy and well-nourished & in no acute distress  No carotid bruits are present.No neck pain distention present at 10 - 15 degrees. Thyroid normal to palpation  Heart rhythm and rate are normal with no significant murmurs or gallops. S4 with slurring at  LSB  Chest is clear with no increased work of breathing  There is no evidence of aortic aneurysm or renal artery bruits  Abdomen soft with no organomegaly or masses. No HJR  No clubbing,  cyanosis or edema present.  Pedal pulses are intact   No ischemic skin changes are present . Nails  Healthy   Alert and oriented. Strength, tone, DTRs reflexes normal         Assessment & Plan:  #1 atypical chest pain, last episode 24 hrs ago #2 non migrainous headache #3 edema of hands #4 HTN #5 edema #6 palpitations See orders

## 2013-06-08 NOTE — Patient Instructions (Addendum)
The urine studies are to rule out increased production of stress hormones (metanephrines and catecholamines). Minimal Blood Pressure Goal= AVERAGE < 140/90;  Ideal is an AVERAGE < 135/85. This AVERAGE should be calculated from @ least 5-7 BP readings taken @ different times of day on different days of week. You should not respond to isolated BP readings , but rather the AVERAGE for that week .Please bring your  blood pressure cuff to office visits to verify that it is reliable.It  can also be checked against the blood pressure device at the pharmacy. Finger or wrist cuffs are not dependable; an arm cuff is. If you have active chest discomfort; please draw blood tests(troponin 1 and d-dimer).

## 2013-06-09 DIAGNOSIS — E119 Type 2 diabetes mellitus without complications: Secondary | ICD-10-CM | POA: Diagnosis not present

## 2013-06-09 DIAGNOSIS — I509 Heart failure, unspecified: Secondary | ICD-10-CM | POA: Diagnosis not present

## 2013-06-09 DIAGNOSIS — I10 Essential (primary) hypertension: Secondary | ICD-10-CM | POA: Diagnosis not present

## 2013-06-13 ENCOUNTER — Encounter: Payer: Self-pay | Admitting: Internal Medicine

## 2013-06-16 ENCOUNTER — Ambulatory Visit (INDEPENDENT_AMBULATORY_CARE_PROVIDER_SITE_OTHER): Payer: Medicare Other | Admitting: Cardiology

## 2013-06-16 ENCOUNTER — Encounter: Payer: Self-pay | Admitting: *Deleted

## 2013-06-16 VITALS — BP 132/80 | HR 106 | Ht 62.0 in | Wt 165.8 lb

## 2013-06-16 DIAGNOSIS — I509 Heart failure, unspecified: Secondary | ICD-10-CM | POA: Diagnosis not present

## 2013-06-16 DIAGNOSIS — I5031 Acute diastolic (congestive) heart failure: Secondary | ICD-10-CM

## 2013-06-16 DIAGNOSIS — E119 Type 2 diabetes mellitus without complications: Secondary | ICD-10-CM | POA: Diagnosis not present

## 2013-06-16 DIAGNOSIS — I10 Essential (primary) hypertension: Secondary | ICD-10-CM | POA: Diagnosis not present

## 2013-06-16 LAB — METANEPHRINES, URINE, 24 HOUR
Metaneph Total, Ur: 380 mcg/24 h (ref 224–832)
Metanephrines, Ur: 107 mcg/24 h (ref 90–315)
Normetanephrine, 24H Ur: 273 mcg/24 h (ref 122–676)

## 2013-06-16 LAB — CATECHOLAMINES, FRACTIONATED, URINE, 24 HOUR
Calculated Total (E+NE): 51 mcg/24 h (ref 26–121)
Creatinine, Urine mg/day-CATEUR: 1.1 g/(24.h) (ref 0.63–2.50)
Dopamine, 24 hr Urine: 203 mcg/24 h (ref 52–480)
Epinephrine, 24 hr Urine: 7 mcg/24 h (ref 2–24)
Norepinephrine, 24 hr Ur: 44 mcg/24 h (ref 15–100)
Total Volume - CF 24Hr U: 2700 mL

## 2013-06-16 NOTE — Progress Notes (Signed)
HPI The patient presents for evaluation of heart failure with preserved ejection fraction. She has never had this problem before. However, she was recently noted to have some wheezing and gurgling. She never had any shortness of breath. She actually presented to the emergency room in late May. Chest x-ray did demonstrate some mild vascular congestion her BNP was slightly elevated. She was observed in the hospital and treated with diuretics. She was started on a low-dose ACE inhibitor. She has since been switched to a low dose ARB with the dose titrated. She did have an echocardiogram and I reviewed this. This demonstrated no valvular abnormalities and preserved systolic function. She actually has a 24-hour urine for metanephrines pending. She thinks she might have broken heart syndrome as she's been under a huge amount of emotional stress. She was very tearful in the office. Her blood pressure has been somewhat elevated though I didn't see any extraordinary readings during her recent events. She was taking a nonsteroidal. She also takes medications as below for migraines. She does not describe PND or orthopnea. She does not have palpitations, presyncope or syncope. She has had occasional severe sharp pain she relates to emotional stress. She has not get this with activities however.  Allergies  Allergen Reactions  . Amoxicillin-Pot Clavulanate     REACTION: DIARRHEA  . Hydrocodone-Acetaminophen     REACTION: sensitive to  but can take for extreme pain per patient  . Propoxyphene Hcl     REACTION: nausea  . Propoxyphene-Acetaminophen     REACTION: nausea  . Sulfa Antibiotics     Unknown   . Lisinopril     Restless , irregular sleep    Current Outpatient Prescriptions  Medication Sig Dispense Refill  . ALPRAZolam (XANAX XR) 0.5 MG 24 hr tablet Take 0.5 mg by mouth at bedtime.      . benzoyl peroxide-erythromycin (BENZAMYCIN) gel Apply topically 2 (two) times daily as needed.      . Estradiol  0.52 MG/0.87 GM (0.06%) GEL Apply 1 application topically daily. 1 pump      . FLUoxetine (PROZAC) 20 MG capsule Take 20 mg by mouth daily.      . furosemide (LASIX) 20 MG tablet Take 1 tablet (20 mg total) by mouth daily.  30 tablet  1  . lidocaine (XYLOCAINE) 5 % ointment       . losartan (COZAAR) 50 MG tablet One daily  30 tablet  0  . topiramate (TOPAMAX) 25 MG tablet Take 50 mg by mouth 2 (two) times daily.       No current facility-administered medications for this visit.    Past Medical History  Diagnosis Date  . Migraines   . Hypertension   . Diabetes mellitus without complication   . Murmur, cardiac     Past Surgical History  Procedure Laterality Date  . Total abdominal hysterectomy w/ bilateral salpingoophorectomy      for fibroids and migraines  . Tonsillectomy and adenoidectomy    . Nasal septum surgery    . Tubal ligation    . Dilation and curettage of uterus      Family History  Problem Relation Age of Onset  . Crohn's disease Mother   . Atrial fibrillation Mother   . Bladder Cancer Father   . Bipolar disorder Sister   . Bipolar disorder Brother   . Heart attack Maternal Grandmother     >65  . Diabetes Neg Hx   . Stroke Neg Hx   .  Prostate cancer Father     History   Social History  . Marital Status: Divorced    Spouse Name: N/A    Number of Children: N/A  . Years of Education: N/A   Occupational History  . Not on file.   Social History Main Topics  . Smoking status: Never Smoker   . Smokeless tobacco: Never Used  . Alcohol Use: No  . Drug Use: No  . Sexually Active: Not on file   Other Topics Concern  . Not on file   Social History Narrative  . No narrative on file    ROS:  Positive tearfulness, migraines, joint pains. 06/16/2013  PHYSICAL EXAM BP 132/80  Pulse 106  Ht 5\' 2"  (1.575 m)  Wt 165 lb 12.8 oz (75.206 kg)  BMI 30.32 kg/m2  SpO2 96% GENERAL:  Well appearing HEENT:  Pupils equal round and reactive, fundi not  visualized, oral mucosa unremarkable NECK:  No jugular venous distention, waveform within normal limits, carotid upstroke brisk and symmetric, no bruits, no thyromegaly LYMPHATICS:  No cervical, inguinal adenopathy LUNGS:  Clear to auscultation bilaterally BACK:  No CVA tenderness CHEST:  Unremarkable HEART:  PMI not displaced or sustained,S1 and S2 within normal limits, no S3, no S4, no clicks, no rubs, no murmurs ABD:  Flat, positive bowel sounds normal in frequency in pitch, no bruits, no rebound, no guarding, no midline pulsatile mass, no hepatomegaly, no splenomegaly EXT:  2 plus pulses throughout, no edema, no cyanosis no clubbing SKIN:  No rashes no nodules NEURO:  Cranial nerves II through XII grossly intact, motor grossly intact throughout PSYCH:  Cognitively intact, oriented to person place and time   EKG:  Sinus rhythm, rate 67, axis within normal limits, intervals within normal limits, no acute ST-T wave changes.  06/16/2013  ASSESSMENT AND PLAN  DIASTOLIC HF:  I did review records if she has evidence of this area however, I think this is treatable with the current therapies to include blood pressure control, salt restriction and volume management.  CHEST PAIN:  This is atypical. I think the pretest probability of obstructive coronary disease is low. I will bring the patient back for a POET (Plain Old Exercise Test). This will allow me to screen for obstructive coronary disease, risk stratify and very importantly provide a prescription for exercise.  HTN:  Blood pressure is controlled with the medications as listed. She will continue these.

## 2013-06-16 NOTE — Patient Instructions (Signed)

## 2013-06-18 ENCOUNTER — Encounter: Payer: Self-pay | Admitting: Internal Medicine

## 2013-06-19 ENCOUNTER — Encounter: Payer: Self-pay | Admitting: Internal Medicine

## 2013-06-19 ENCOUNTER — Ambulatory Visit (INDEPENDENT_AMBULATORY_CARE_PROVIDER_SITE_OTHER): Payer: Medicare Other | Admitting: Internal Medicine

## 2013-06-19 VITALS — BP 112/74 | HR 77 | Temp 98.1°F | Resp 12 | Wt 165.0 lb

## 2013-06-19 DIAGNOSIS — J029 Acute pharyngitis, unspecified: Secondary | ICD-10-CM | POA: Diagnosis not present

## 2013-06-19 DIAGNOSIS — R0989 Other specified symptoms and signs involving the circulatory and respiratory systems: Secondary | ICD-10-CM

## 2013-06-19 DIAGNOSIS — R591 Generalized enlarged lymph nodes: Secondary | ICD-10-CM

## 2013-06-19 DIAGNOSIS — R599 Enlarged lymph nodes, unspecified: Secondary | ICD-10-CM

## 2013-06-19 LAB — POCT MONO (EPSTEIN BARR VIRUS): Mono, POC: NEGATIVE

## 2013-06-19 LAB — POCT RAPID STREP A (OFFICE): Rapid Strep A Screen: NEGATIVE

## 2013-06-19 NOTE — Patient Instructions (Addendum)
Zicam Melts or Zinc lozenges as per package label for scratchy throat . Complementary options include  vitamin C 2000 mg daily; & Echinacea for 4-7 days. Report persistent or progressive fever; discolored nasal or chest secretions; or frontal headache or facial  pain.     To prevent sleep dysfunction follow these instructions for sleep hygiene. Do not read, watch TV, or eat in bed. Do not get into bed until you are ready to turn off the light &  to go to sleep. Do not ingest stimulants ( decongestants, diet pills, nicotine, caffeine) after the evening meal.Do not take daytime naps.Cardiovascular exercise is recommended 30-45 minutes 3-4 times per week once Dr Antoine Poche clears you .

## 2013-06-19 NOTE — Progress Notes (Signed)
  Subjective:    Patient ID: Ebony Garcia, female    DOB: 22-Mar-1951, 62 y.o.   MRN: [id number]  HPI   She was awakened at 3 AM 06/18/13 with severe sore throat. It is diffuse but greater on the right than left.  It is exacerbated by the intake of coffee or tea or even swallowing saliva.  She's had some sneezing without other extrinsic symptoms.  She's had an intermittent NP cough since September for which she uses lozenges. Her chest x-ray 05/17/13 was normal. She is not on an ACE inhibitor.  She has no history of asthma. She's never smoked   Review of Systems  She denies associated fever, chills, or sweats. Specifically she is not having itchy, watery eyes. She has no associated angioedema symptoms. She also has no frontal sinus pain, facial pain, dental pain, or otic discharge significance. She's had some ear discomfort.  She describes chronic insomnia     Objective:   Physical Exam General appearance:good health ;well nourished; no acute distress or increased work of breathing is present.  No  lymphadenopathy about the head, neck, or axilla noted but tenderness to palpation in submandibular areas, R > L.   Eyes: No conjunctival inflammation or lid edema is present. There is no scleral icterus.  Ears:  External ear exam shows no significant lesions or deformities.  Otoscopic examination reveals clear canals, tympanic membranes are intact bilaterally without bulging, retraction, inflammation or discharge.  Nose:  External nasal examination shows no deformity or inflammation. Nasal mucosa are pink and moist without lesions or exudates. No septal dislocation or deviation.No obstruction to airflow.   Oral exam: Dental hygiene is good; lips and gums are healthy appearing.There is no oropharyngeal erythema or exudate noted.   Neck:  No deformities, thyromegaly, masses, or tenderness noted.   Supple with full range of motion without pain.   Heart:  Normal rate and regular rhythm.  S1 and S2 normal without gallop, murmur, click, or rub . S4 Lungs:Chest clear to auscultation; no wheezes, rhonchi,rales ,or rubs present.No increased work of breathing.    Extremities:  No cyanosis, edema, or clubbing  noted    Skin: Warm & dry .         Assessment & Plan:  #1 pharyngitis See plan

## 2013-06-20 DIAGNOSIS — E119 Type 2 diabetes mellitus without complications: Secondary | ICD-10-CM | POA: Diagnosis not present

## 2013-06-20 DIAGNOSIS — I509 Heart failure, unspecified: Secondary | ICD-10-CM | POA: Diagnosis not present

## 2013-06-20 DIAGNOSIS — I10 Essential (primary) hypertension: Secondary | ICD-10-CM | POA: Diagnosis not present

## 2013-06-22 ENCOUNTER — Encounter: Payer: Self-pay | Admitting: Internal Medicine

## 2013-06-28 ENCOUNTER — Telehealth: Payer: Self-pay | Admitting: Internal Medicine

## 2013-06-28 NOTE — Telephone Encounter (Signed)
Patient is calling to request a refill on her furosemide (LASIX) 20 MG tablet medication. States that it was originally prescribed by a doctor she saw in the hospital. Please advise.

## 2013-06-28 NOTE — Telephone Encounter (Signed)
#   30 , R X5

## 2013-06-28 NOTE — Telephone Encounter (Signed)
Pharmacy is requesting a refill on lasix 20 mg although you have never filled this medication for this patient before please advise.

## 2013-06-29 ENCOUNTER — Institutional Professional Consult (permissible substitution): Payer: Medicare Other | Admitting: Cardiology

## 2013-06-29 MED ORDER — FUROSEMIDE 20 MG PO TABS: 20.0000 mg | ORAL_TABLET | Freq: Every day | ORAL | Status: DC

## 2013-06-29 NOTE — Telephone Encounter (Signed)
RX sent

## 2013-07-04 ENCOUNTER — Other Ambulatory Visit: Payer: Self-pay | Admitting: Internal Medicine

## 2013-07-05 ENCOUNTER — Ambulatory Visit: Payer: Medicare Other | Admitting: Physician Assistant

## 2013-07-05 ENCOUNTER — Encounter: Payer: Self-pay | Admitting: Internal Medicine

## 2013-07-05 DIAGNOSIS — R0789 Other chest pain: Secondary | ICD-10-CM

## 2013-07-05 NOTE — Progress Notes (Signed)
Exercise Treadmill Test  Pre-Exercise Testing Evaluation Rhythm: normal sinus  Rate: 60     Test  Exercise Tolerance Test Ordering MD: Angelina Sheriff, MD  Interpreting MD: Tereso Newcomer, PA-C  Unique Test No: 1  Treadmill:  1  Indication for ETT: chest pain - rule out ischemia  Contraindication to ETT: No   Stress Modality: exercise - treadmill  Cardiac Imaging Performed: non   Protocol: standard Bruce - maximal  Max BP:  167/77  Max MPHR (bpm):  161 85% MPR (bpm):  137  MPHR obtained (bpm):  151 % MPHR obtained:  94  Reached 85% MPHR (min:sec):  6:15 Total Exercise Time (min-sec):  6:42  Workload in METS:  8.0 Borg Scale: 15  Reason ETT Terminated:  patient's desire to stop    ST Segment Analysis At Rest: normal ST segments - no evidence of significant ST depression With Exercise: non-specific ST changes  Other Information Arrhythmia:  No Angina during ETT:  absent (0) Quality of ETT:  diagnostic  ETT Interpretation:  Low Risk ETT  Comments: Fair exercise tolerance. No chest pain. Normal BP response to exercise. She did have borderline ST changes that varied throughout exercise with up-sloping ST depression.  Interpretation of ECG is limited by baseline LVH.   Good HR recovery in 1st minute post exercise. Reviewed with Dr. Tonny Bollman (DOD).  Overall low risk study.  Recommendations: Could consider perfusion study in the future if symptoms persist. Follow up with Dr. Rollene Rotunda as directed. Signed,  Tereso Newcomer, PA-C   07/05/2013 3:49 PM

## 2013-07-07 ENCOUNTER — Telehealth: Payer: Self-pay | Admitting: Internal Medicine

## 2013-07-07 DIAGNOSIS — I10 Essential (primary) hypertension: Secondary | ICD-10-CM

## 2013-07-07 MED ORDER — LOSARTAN POTASSIUM 50 MG PO TABS: ORAL_TABLET | ORAL | Status: DC

## 2013-07-07 NOTE — Telephone Encounter (Signed)
Patient states that she has ran out of her Losartan Rx and needs it refilled today. She is now taking one whole tablet but [facility] on [address] still has her old script where she was only taking 1/2. [facility] [address] needs for a new rx to be sent over for her Losartan with her new dosage amount. Please follow-up.

## 2013-07-07 NOTE — Telephone Encounter (Signed)
RX sent

## 2013-07-10 ENCOUNTER — Telehealth: Payer: Self-pay | Admitting: Internal Medicine

## 2013-07-10 NOTE — Telephone Encounter (Signed)
Patient states that Ebony Garcia will not override her Losartan Rx being refilled before Aug 10th. Patient says that she does not have any to take and wants to know what can be done. Please advise.

## 2013-07-11 NOTE — Telephone Encounter (Signed)
LVM to contact insurance company.

## 2013-07-11 NOTE — Telephone Encounter (Signed)
Per Chrae, patient needs to call her insurance company.

## 2013-07-21 ENCOUNTER — Encounter: Payer: Self-pay | Admitting: Internal Medicine

## 2013-08-07 ENCOUNTER — Encounter: Payer: Self-pay | Admitting: Internal Medicine

## 2013-08-08 ENCOUNTER — Telehealth: Payer: Self-pay

## 2013-08-08 NOTE — Telephone Encounter (Signed)
If HA is severe not improving in the next 24 hours needs to be seen. Otherwise take Topamax 25 mg one tablet daily for 5 days. Then one tablet twice a day for 5 days Then 2 tablets in the morning and one at night for 5 days Finally 2 tablets twice a day. If side effects from Topamax are unacceptable, needs to discuss further with PCP. `

## 2013-08-08 NOTE — Telephone Encounter (Signed)
Per MyChart and call; patient states that she had tapered off the Topamax due to side effects. She titrated down to zero mgs per day. States that her migraines have returned with a "vengance". This particular episode with no relief started 4 days ago. She started back on the Topamax yesterday (25mg ). She has also taken Tylenol, one Imitrex and one half alaprazolam (0.25).  Please advise recommendations.

## 2013-08-09 NOTE — Telephone Encounter (Signed)
Spoke with patient and reviewed instructions per orders. Patient agrees with plan. Patient states that her headache went away in the afternoon yesterday.

## 2013-08-16 ENCOUNTER — Encounter: Payer: Self-pay | Admitting: Internal Medicine

## 2013-08-18 ENCOUNTER — Encounter: Payer: Self-pay | Admitting: Internal Medicine

## 2013-08-21 ENCOUNTER — Emergency Department (HOSPITAL_BASED_OUTPATIENT_CLINIC_OR_DEPARTMENT_OTHER)
Admission: EM | Admit: 2013-08-21 | Discharge: 2013-08-21 | Disposition: A | Discharge status: Home / Self Care | Payer: Medicare Other | Attending: Emergency Medicine | Admitting: Emergency Medicine

## 2013-08-21 ENCOUNTER — Encounter (HOSPITAL_BASED_OUTPATIENT_CLINIC_OR_DEPARTMENT_OTHER): Payer: Self-pay | Admitting: *Deleted

## 2013-08-21 ENCOUNTER — Telehealth: Payer: Self-pay | Admitting: General Practice

## 2013-08-21 DIAGNOSIS — R002 Palpitations: Secondary | ICD-10-CM | POA: Insufficient documentation

## 2013-08-21 DIAGNOSIS — F410 Panic disorder [episodic paroxysmal anxiety] without agoraphobia: Secondary | ICD-10-CM | POA: Diagnosis not present

## 2013-08-21 DIAGNOSIS — R0789 Other chest pain: Secondary | ICD-10-CM | POA: Diagnosis not present

## 2013-08-21 DIAGNOSIS — I10 Essential (primary) hypertension: Secondary | ICD-10-CM | POA: Diagnosis not present

## 2013-08-21 DIAGNOSIS — I509 Heart failure, unspecified: Secondary | ICD-10-CM | POA: Insufficient documentation

## 2013-08-21 DIAGNOSIS — G43909 Migraine, unspecified, not intractable, without status migrainosus: Secondary | ICD-10-CM | POA: Insufficient documentation

## 2013-08-21 DIAGNOSIS — M25569 Pain in unspecified knee: Secondary | ICD-10-CM | POA: Insufficient documentation

## 2013-08-21 DIAGNOSIS — E119 Type 2 diabetes mellitus without complications: Secondary | ICD-10-CM | POA: Insufficient documentation

## 2013-08-21 DIAGNOSIS — Z79899 Other long term (current) drug therapy: Secondary | ICD-10-CM | POA: Insufficient documentation

## 2013-08-21 DIAGNOSIS — F341 Dysthymic disorder: Secondary | ICD-10-CM | POA: Insufficient documentation

## 2013-08-21 DIAGNOSIS — I498 Other specified cardiac arrhythmias: Secondary | ICD-10-CM | POA: Diagnosis not present

## 2013-08-21 DIAGNOSIS — M25562 Pain in left knee: Secondary | ICD-10-CM

## 2013-08-21 HISTORY — DX: Heart failure, unspecified: I50.9

## 2013-08-21 HISTORY — DX: Other specified anxiety disorders: F41.8

## 2013-08-21 HISTORY — DX: Panic disorder (episodic paroxysmal anxiety): F41.0

## 2013-08-21 LAB — BASIC METABOLIC PANEL
BUN: 17 mg/dL (ref 6–23)
CO2: 25 mEq/L (ref 19–32)
Calcium: 9.5 mg/dL (ref 8.4–10.5)
Chloride: 107 mEq/L (ref 96–112)
Creatinine, Ser: 0.9 mg/dL (ref 0.50–1.10)
GFR calc Af Amer: 80 mL/min — ABNORMAL LOW (ref >90)
GFR calc non Af Amer: 69 mL/min — ABNORMAL LOW (ref >90)
Glucose, Bld: 100 mg/dL — ABNORMAL HIGH (ref 70–99)
Potassium: 4 mEq/L (ref 3.5–5.1)
Sodium: 141 mEq/L (ref 135–145)

## 2013-08-21 NOTE — ED Notes (Signed)
Patient states she has had shooting pain in her left lateral knee for the last four days.  Denies any injury.  States the night the knee pain began, she had an uneasy feeling in her chest when she went to bed.  Denies any chest pain at this time.  States she has a history of CHF this year.

## 2013-08-21 NOTE — ED Provider Notes (Signed)
CSN: [id number]     Arrival date & time 08/21/13  1227 History   First MD Initiated Contact with Patient 08/21/13 1333     Chief Complaint  Patient presents with  . Leg Pain   (Consider location/radiation/quality/duration/timing/severity/associated sxs/prior Treatment) HPI Complained of left lateral knee pain onset 4 days ago pain felt like "electric shocks" lasting a few seconds at a time. She also reports she's had a few episodes of chest pain sharp lasting approximately 10 seconds at a time. She is uncertain whether chest pain occurred simultaneously with knee pain.. She is presently asymptomatic. Pain in chest and knee pain are nonexertional. Nothing makes symptoms better or worse. No treatment prior to coming here. She was concerned that she may have a possible blood clot in her leg. Past Medical History  Diagnosis Date  . Migraines   . Hypertension   . Diabetes mellitus without complication     Diet controlled  . CHF (congestive heart failure)   . Depression with anxiety   . Panic attacks    Past Surgical History  Procedure Laterality Date  . Total abdominal hysterectomy w/ bilateral salpingoophorectomy      for fibroids and migraines  . Tonsillectomy and adenoidectomy    . Nasal septum surgery    . Tubal ligation    . Dilation and curettage of uterus    . Papaloma Right    Family History  Problem Relation Age of Onset  . Crohn's disease Mother   . Atrial fibrillation Mother   . Bladder Cancer Father   . Bipolar disorder Sister   . Bipolar disorder Brother   . Heart attack Maternal Grandmother     >65  . Diabetes Neg Hx   . Stroke Neg Hx   . Prostate cancer Father    History  Substance Use Topics  . Smoking status: Never Smoker   . Smokeless tobacco: Never Used  . Alcohol Use: No   OB History   Grav Para Term Preterm Abortions TAB SAB Ect Mult Living                 Review of Systems  Cardiovascular: Positive for chest pain and palpitations.   Musculoskeletal: Positive for arthralgias.       LeftKnee pain  Psychiatric/Behavioral: Positive for dysphoric mood.       Depression over breakup of marriage and a estrangement from multiple family members. Denies SI or HI    Allergies  Amoxicillin-pot clavulanate; Hydrocodone-acetaminophen; Propoxyphene hcl; Propoxyphene-acetaminophen; Sulfa antibiotics; and Lisinopril  Home Medications   Current Outpatient Rx  Name  Route  Sig  Dispense  Refill  . ALPRAZolam (XANAX XR) 0.5 MG 24 hr tablet   Oral   Take 0.5 mg by mouth at bedtime.         . benzoyl peroxide-erythromycin (BENZAMYCIN) gel   Topical   Apply topically 2 (two) times daily as needed.         . Estradiol 0.52 MG/0.87 GM (0.06%) GEL   Topical   Apply 1 application topically daily. 1 pump         . FLUoxetine (PROZAC) 20 MG capsule   Oral   Take 20 mg by mouth daily.         . furosemide (LASIX) 20 MG tablet   Oral   Take 1 tablet (20 mg total) by mouth daily.   30 tablet   5   . lidocaine (XYLOCAINE) 5 % ointment               .  losartan (COZAAR) 50 MG tablet      One daily   90 tablet   1     Dose increase form 1/2 to 1 by mouth daily-FYI   . topiramate (TOPAMAX) 25 MG tablet   Oral   Take 50 mg by mouth 2 (two) times daily.          BP 121/64  Pulse 55  Temp(Src) 98.2 F (36.8 C) (Oral)  Ht 5' 2.5" (1.588 m)  Wt 158 lb (71.668 kg)  BMI 28.42 kg/m2  SpO2 100% Physical Exam  Nursing note and vitals reviewed. Constitutional: She appears well-developed and well-nourished.  HENT:  Head: Normocephalic and atraumatic.  Eyes: Conjunctivae are normal. Pupils are equal, round, and reactive to light.  Neck: Neck supple. No tracheal deviation present. No thyromegaly present.  Cardiovascular: Regular rhythm.   No murmur heard. Bradycardic  Pulmonary/Chest: Effort normal and breath sounds normal.  Abdominal: Soft. Bowel sounds are normal. She exhibits no distension. There is no  tenderness.  Musculoskeletal: Normal range of motion. She exhibits no edema and no tenderness.  All 4 service to redness swelling or tenderness neurovascularly intact  Neurological: She is alert. Coordination normal.  Skin: Skin is warm and dry. No rash noted.  Psychiatric: She has a normal mood and affect.    ED Course  Procedures (including critical care time) Labs Review Labs Reviewed - No data to display Imaging Review No results found.  Date: 08/21/2013  Rate: 45  Rhythm: sinus bradycardia  QRS Axis: normal  Intervals: normal  ST/T Wave abnormalities: nonspecific T wave changes  Conduction Disutrbances:none  Narrative Interpretation:   Old EKG Reviewed: Rate slower than tracing from 04/30/2013 otherwise no significant change interpreted by me Symptomatic throughout her entire emergency department course  Results for orders placed during the hospital encounter of 08/21/13  BASIC METABOLIC PANEL      Result Value Range   Sodium 141  135 - 145 mEq/L   Potassium 4.0  3.5 - 5.1 mEq/L   Chloride 107  96 - 112 mEq/L   CO2 25  19 - 32 mEq/L   Glucose, Bld 100 (*) 70 - 99 mg/dL   BUN 17  6 - 23 mg/dL   Creatinine, Ser 1.61  0.50 - 1.10 mg/dL   Calcium 9.5  8.4 - 09.6 mg/dL   GFR calc non Af Amer 69 (*) >90 mL/min   GFR calc Af Amer 80 (*) >90 mL/min   No results found.  MDM  No diagnosis found. Strongly doubt acute coronary syndrome with highly atypical symptoms, nonacute EKG. Strongly doubt phlebitis. Transient symptoms no physical evidence of phlebitis. Bradycardic rhythm seen on today's Bradycardia is asymptomatic. Requires no further treatment  Diagnosis #1 left knee pain #2 atypical chest pain  #3 asymptomatic bradycardia       Doug Sou, MD 08/21/13 [phone]

## 2013-08-21 NOTE — Telephone Encounter (Signed)
Since no answer when I called a second time left a message on pt's phone and through mychart advising that if pt thinks she is having blood clots and is having chest pain needs to be evaluated at ER, especially if symptoms have worsened, lingering, or are persistently worsening.

## 2013-08-21 NOTE — Telephone Encounter (Signed)
Pt advised she is going to the ER.

## 2013-08-21 NOTE — Telephone Encounter (Signed)
Received this message from pt through myChart: HA still under control w/50 mg. Topamax a.m., 25 mg. p.m. Now I am having sharp shooting pains for FIRST time starting last night in & around left knee on left side while trying to sleep. Started again 2x today while eating out and shopping w/Mother. It really scared me, as I was afraid it might be a possible blood clot or something. Now Chest/Heart are hurting off/on. STRESS?? HEARTBREAK?  Called pt back regarding chest pain and leg pain to get more information. Had to leave a message on the only phone listed.

## 2013-08-25 ENCOUNTER — Telehealth: Payer: Self-pay | Admitting: Internal Medicine

## 2013-08-25 NOTE — Telephone Encounter (Signed)
Patient called stating she needs rx for topamax written for 2 in am, 2 in pm. She states we only gave her 6 pills and she needs a 1 month supply. [facility]

## 2013-08-28 MED ORDER — TOPIRAMATE 25 MG PO TABS: 50.0000 mg | ORAL_TABLET | Freq: Two times a day (BID) | ORAL | Status: DC

## 2013-08-28 NOTE — Telephone Encounter (Signed)
Patient is calling back about her Topamax. States that she needs it refilled before we close so that she can take it tonight.

## 2013-08-28 NOTE — Telephone Encounter (Signed)
Patient called again regarding this rx request. She states it is urgent and is requesting this be done ASAP.

## 2013-08-28 NOTE — Telephone Encounter (Signed)
Rx sent to the pharmacy by e-script.  Pt aware.//AB/CMA 

## 2013-09-03 ENCOUNTER — Encounter: Payer: Self-pay | Admitting: Internal Medicine

## 2013-09-13 DIAGNOSIS — F332 Major depressive disorder, recurrent severe without psychotic features: Secondary | ICD-10-CM | POA: Diagnosis not present

## 2013-09-14 ENCOUNTER — Ambulatory Visit (INDEPENDENT_AMBULATORY_CARE_PROVIDER_SITE_OTHER): Payer: Medicare Other | Admitting: Internal Medicine

## 2013-09-14 ENCOUNTER — Encounter: Payer: Self-pay | Admitting: Internal Medicine

## 2013-09-14 VITALS — BP 123/75 | HR 64 | Temp 97.8°F | Wt 156.2 lb

## 2013-09-14 DIAGNOSIS — R0789 Other chest pain: Secondary | ICD-10-CM

## 2013-09-14 DIAGNOSIS — G43809 Other migraine, not intractable, without status migrainosus: Secondary | ICD-10-CM | POA: Diagnosis not present

## 2013-09-14 MED ORDER — KETOROLAC TROMETHAMINE 60 MG/2ML IM SOLN
60.0000 mg | Freq: Once | INTRAMUSCULAR | Status: AC
  Administered 2013-09-14: 60 mg via INTRAMUSCULAR

## 2013-09-14 NOTE — Patient Instructions (Addendum)
I recommend a Neurology consultation to determine optimal therapy. Take headache diary to that appointment

## 2013-09-14 NOTE — Progress Notes (Signed)
Subjective:    Patient ID: Ebony Garcia, female    DOB: [birth year], 62 y.o.   MRN: [id number]  HPI symptoms began prior to sun rise this morning when she woke up as left sided headache which quickly radiated diffusely. She believes that triggers had been barometric pressure changes and emotional stress.  The headache has been there since she awoke. Tylenol was of no benefit; ice & heat help some. The headache has been of variable character from dull to sharp or aching She's had minor associated symptoms of blurred vision, sensitivity to light and sound, and tinnitus.  She also has a tendency to bruise which is unrelated.  She's had a history of migraines for over 2 and half decades. Migraines are present among her mother, grandmother, great-aunt, and cousins.   She saw her counselor yesterday who recommended increasing her fluoxetine from 20 mg to 30 mg daily.    Review of Systems  She has not had double vision or loss of vision. There is not excessive tearing of her eyes but she has been crying with emotional stresses. There's been no hearing loss. She also denies vertiginous symptoms There's been no extremity weakness, numbness, or tingling. There's been no imbalance. There've been no signs of rhinosinusitis such as frontal headache, facial pain, nasal purulence, sore throat, fever, chills, or sweats. There has also been no change in color or temperature of the skin in the area of the headache. There has been an intermittent intention tremor of the right hand.  She has intermittent severe chest tightness. This was evaluated by cardiologist which included stress testing which was negative.    Objective:   Physical Exam Gen.: Uncomfortable but well-nourished in appearance. Alert, appropriate and cooperative throughout exam. Head: Normocephalic without obvious abnormalities.No significant tenderness over temples Eyes: No corneal or conjunctival inflammation noted. Pupils equal  round reactive to light and accommodation. FOV normal. Extraocular motion intact. Vision grossly normal without lenses Ears: External  ear exam reveals no significant lesions or deformities. Canals clear .TMs normal. Hearing is grossly normal bilaterally.Tuning fork exam normal Nose: External nasal exam reveals no deformity or inflammation. Nasal mucosa are pink and moist. No lesions or exudates noted.   Mouth: Oral mucosa and oropharynx reveal no lesions or exudates. Teeth in good repair. Neck: No deformities, masses, or tenderness noted. Range of motion & Thyroid normal. Lungs: Normal respiratory effort; chest expands symmetrically. Lungs are clear to auscultation without rales, wheezes, or increased work of breathing. Heart: Normal rate and rhythm. Normal S1 and S2. No gallop, click, or rub. S4 vs split S1 ; no murmur. Abdomen: Bowel sounds normal; abdomen soft and nontender. No masses, organomegaly or hernias noted.                                   Musculoskeletal/extremities:  No clubbing, cyanosis, edema, or significant extremity  deformity noted. Range of motion normal .Tone & strength  Normal. Joints normal . Nail health good. Able to lie down & sit up w/o help. Negative SLR bilaterally Vascular: Carotid, radial artery, dorsalis pedis and  posterior tibial pulses are full and equal. No bruits present. Neurologic: Alert and oriented x3. Deep tendon reflexes symmetrical and normal.  Gait normal. No cranial nerve deficit present. Romberg and finger to nose testing normal      Skin: Intact without suspicious lesions or rashes. Lymph: No cervical, axillary lymphadenopathy present. Psych: Mood and affect  are normal. Normally interactive                                                                                        Assessment & Plan:  #1 migraine exacerbation with atypical characteristics and triggers. No neurologic deficit present. Narcotic pain medications for migraines does  not represent a standard of care to my knowledge. Neurology assessment appropriate #2 emotional stress; as per Ellis Savage #3 atypical chest pain which makes tryptans problematic  Plan: see orders

## 2013-09-15 ENCOUNTER — Encounter: Payer: Self-pay | Admitting: Internal Medicine

## 2013-09-19 ENCOUNTER — Encounter: Payer: Self-pay | Admitting: Internal Medicine

## 2013-09-27 ENCOUNTER — Ambulatory Visit (INDEPENDENT_AMBULATORY_CARE_PROVIDER_SITE_OTHER): Payer: Medicare Other | Admitting: Neurology

## 2013-09-27 ENCOUNTER — Other Ambulatory Visit: Payer: Self-pay | Admitting: Internal Medicine

## 2013-09-27 ENCOUNTER — Encounter: Payer: Self-pay | Admitting: Neurology

## 2013-09-27 VITALS — BP 118/80 | HR 66 | Temp 98.0°F | Ht 62.5 in | Wt 157.0 lb

## 2013-09-27 DIAGNOSIS — G43709 Chronic migraine without aura, not intractable, without status migrainosus: Secondary | ICD-10-CM

## 2013-09-27 MED ORDER — ISOMETHEPTENE-APAP-DICHLORAL 65-325-100 MG PO CAPS: 1.0000 | ORAL_CAPSULE | Freq: Four times a day (QID) | ORAL | Status: DC | PRN

## 2013-09-27 NOTE — Progress Notes (Signed)
NEUROLOGY CONSULTATION NOTE  Ebony Garcia MRN: [id number] DOB: Sep 10, 1951  Referring provider: Dr. Alwyn Ren Primary care provider: Dr. Alwyn Ren   Reason for consult:  Chronic headache.  HISTORY OF PRESENT ILLNESS: Ebony Garcia is a 62 year old right-handed woman with hypertension, renal calculi, depression, hyperlipidemia and recently diagnosed CHF (diastolic dysfunction) who presents for headache.  Records and images were personally reviewed where available.    Onset:  Since late 30s Location: usually left sided, periorbital, and back of head.  More recently, holocephalic Quality:  Sharp, aching pressure.  Non-throbbing Intensity:  7/10 Associated symptoms:  Nausea, photophobia and phonophobia.  No visual disturbance, osmophobia or vomiting Aura:  no Duration:  3 days Frequency:  Approximately 20-22 headache days/month.  Has been headache-free for past 5 days. Activity:  Able to perform activities until she gets home Triggers/exacerbating factors:  stress Relieving factors:  Cold mask, heating pack on neck and shoulders  Past abortive therapy:  Tylenol (ineffective), ice & heat (helps some), Imitrex 100mg , Zomig (effective), Vicodin, Frova, indomethacin, DHE (effective), magnesium (ineffective), feverfew (ineffective), Midrin (effective but made sleepy), naproxen (ineffective), toradol (ineffective), Excedrin migraine (ineffective). Past preventative therapy:  propranolol (ineffective), Botox (tried once but gave flu symptoms), occipital nerve blocks, trigger point injections, amitriptyline (side effects), Depakote (hair loss), Effexor (for depression)  Current abortive therapy: Nothing Current preventative therapy: Topamax 50mg  BID.    Alcohol:  no Caffeine:  1 cup coffee/day Sleep hygiene:  Sleeps well with Xanax and melatonin Stress/depression:  A lot of emotional stress concerning family Family history of headache:  Mother, grandmother, great-aunt, cousins.  08/16/08  MRI Brain w/wo performed for "explosive headaches": nonspecific punctate hyperintensities in the subcortical white matter. 08/16/08 MRA Head: Unremarkable.  There is mild attenuation in the vertebrobasilar junction and proximal basilar artery which is likely artifact.  She has seen multiple neurologists and headache specialists in the past.  She was diagnosed with heart failure (diastolic dysfunction) this past year.  Therefore, she is unfortunately limited to potential medications (such as triptans and NSAIDs).  PAST MEDICAL HISTORY: Past Medical History  Diagnosis Date  . Migraines   . Hypertension   . Diabetes mellitus without complication     Diet controlled  . CHF (congestive heart failure)   . Depression with anxiety   . Panic attacks     PAST SURGICAL HISTORY: Past Surgical History  Procedure Laterality Date  . Total abdominal hysterectomy w/ bilateral salpingoophorectomy      for fibroids and migraines  . Tonsillectomy and adenoidectomy    . Nasal septum surgery    . Tubal ligation    . Dilation and curettage of uterus    . Papaloma Right     MEDICATIONS: Current Outpatient Prescriptions on File Prior to Visit  Medication Sig Dispense Refill  . ALPRAZolam (XANAX) 1 MG tablet Take 1 mg by mouth at bedtime as needed for sleep.      . benzoyl peroxide-erythromycin (BENZAMYCIN) gel Apply topically 2 (two) times daily as needed.      . Estradiol 0.52 MG/0.87 GM (0.06%) GEL Apply 1 application topically daily. 1 pump      . fluocinonide gel (LIDEX) 0.05 % Apply 1 application topically daily.      Marland Kitchen FLUoxetine (PROZAC) 20 MG capsule Take 20 mg by mouth daily.      . furosemide (LASIX) 20 MG tablet Take 1 tablet (20 mg total) by mouth daily.  30 tablet  5  . lidocaine (XYLOCAINE) 5 % ointment       .  losartan (COZAAR) 50 MG tablet One daily  90 tablet  1  . topiramate (TOPAMAX) 25 MG tablet Take 2 tablets (50 mg total) by mouth 2 (two) times daily.  120 tablet  0   No current  facility-administered medications on file prior to visit.    ALLERGIES: Allergies  Allergen Reactions  . Amoxicillin-Pot Clavulanate     REACTION: DIARRHEA  . Hydrocodone-Acetaminophen     REACTION: sensitive to  but can take for extreme pain per patient  . Propoxyphene Hcl     REACTION: nausea  . Propoxyphene-Acetaminophen     REACTION: nausea  . Sulfa Antibiotics     Unknown   . Lisinopril     Restless , irregular sleep    FAMILY HISTORY: Family History  Problem Relation Age of Onset  . Crohn's disease Mother   . Atrial fibrillation Mother   . Bladder Cancer Father   . Bipolar disorder Sister   . Bipolar disorder Brother   . Heart attack Maternal Grandmother     >65  . Diabetes Neg Hx   . Stroke Neg Hx   . Prostate cancer Father     SOCIAL HISTORY: History   Social History  . Marital Status: Divorced    Spouse Name: N/A    Number of Children: 3  . Years of Education: N/A   Occupational History  .     Social History Main Topics  . Smoking status: Never Smoker   . Smokeless tobacco: Never Used  . Alcohol Use: No  . Drug Use: No  . Sexual Activity: Not on file   Other Topics Concern  . Not on file   Social History Narrative   Lives alone.            REVIEW OF SYSTEMS: Constitutional: No fevers, chills, or sweats, no generalized fatigue, change in appetite Eyes: No visual changes, double vision, eye pain Ear, nose and throat: No hearing loss, ear pain, nasal congestion, sore throat Cardiovascular: No chest pain, palpitations Respiratory:  No shortness of breath at rest or with exertion, wheezes GastrointestinaI: No nausea, vomiting, diarrhea, abdominal pain, fecal incontinence Genitourinary:  No dysuria, urinary retention or frequency Musculoskeletal:  No neck pain, back pain Integumentary: No rash, pruritus, skin lesions Neurological: as above Psychiatric: No depression, insomnia, anxiety Endocrine: No palpitations, fatigue, diaphoresis, mood  swings, change in appetite, change in weight, increased thirst Hematologic/Lymphatic:  No anemia, purpura, petechiae. Allergic/Immunologic: no itchy/runny eyes, nasal congestion, recent allergic reactions, rashes  PHYSICAL EXAM: Filed Vitals:   09/27/13 1224  BP: 118/80  Pulse: 66  Temp: 98 F (36.7 C)   General: No acute distress Head:  Normocephalic/atraumatic Neck: supple, no paraspinal tenderness, full range of motion Back: No paraspinal tenderness Heart: regular rate and rhythm Lungs: Clear to auscultation bilaterally. Vascular: No carotid bruits. Neurological Exam: Mental status: alert and oriented to person, place, and time, speech fluent and not dysarthric, language intact. Cranial nerves: CN I: not tested CN II: pupils equal, round and reactive to light, visual fields intact, fundi unremarkable. CN III, IV, VI:  full range of motion, no nystagmus, no ptosis CN V: facial sensation intact CN VII: upper and lower face symmetric CN VIII: hearing intact CN IX, X: gag intact, uvula midline CN XI: sternocleidomastoid and trapezius muscles intact CN XII: tongue midline Bulk & Tone: normal, no fasciculations. Motor: 5/5 throughout Sensation: temperature and vibration intact Deep Tendon Reflexes: 2+ throughout, toes down Finger to nose testing: normal Heel to shin:  normal Gait: normal stance and stride, able to walk in tandem. Romberg negative.  IMPRESSION: Chronic migraine without aura.  PLAN: 1.  Continue topamax 50mg  BID (increased to this dose over the past 3 weeks).  Call in one month with update and adjustments can be made. 2.  For acute attacks, we will try Midrin.  She doesn't work anymore so she does not mind the associated fatigue she experienced in the past.  Not to take more than two days out of week to prevent rebound headache. 3.  Maintain proper sleep, hydration, stress management 4.  Follow up in 2 months.  Thank you for allowing me to take part in the  care of this patient.  Shon Millet, DO  CC:  Marga Melnick, MD

## 2013-09-27 NOTE — Patient Instructions (Signed)
1.  Continue Topamax 50mg  twice daily.  Call in 4 weeks with update and we can change dose if needed. 2.  For acute headache attacks, try Midrin.  Do not take pain medications more than 2 days out of the week to prevent rebound headache. 3.  Sleep well. 4.  Stay hydrated. 5.  Stress/depression management. 6.  Follow up in 2 months.  Call with questions or concerns.

## 2013-09-29 ENCOUNTER — Telehealth: Payer: Self-pay | Admitting: Cardiology

## 2013-09-29 ENCOUNTER — Telehealth: Payer: Self-pay | Admitting: Neurology

## 2013-09-29 ENCOUNTER — Telehealth: Payer: Self-pay

## 2013-09-29 MED ORDER — METOCLOPRAMIDE HCL 10 MG PO TABS: 10.0000 mg | ORAL_TABLET | Freq: Every day | ORAL | Status: DC | PRN

## 2013-09-29 NOTE — Telephone Encounter (Signed)
Patient called no answer.Left message on personal voice mail spoke to Dr.Hochrein he advised he did not want to change B/P medication.He advised take tylenol and take alprazolam.Advised to monitor B/P and if B/P continues to be elevated call back.

## 2013-09-29 NOTE — Telephone Encounter (Signed)
New problem      Pt's BP right now  155/94 she is in her bed and has had a headache since last night.  Pt can not take anything for it.  Pt needs a call back about what she can do??   Thanks!

## 2013-09-29 NOTE — Telephone Encounter (Signed)
Message copied by Donata Duff on Fri Sep 29, 2013  1:19 PM ------      Message from: JAFFE, ADAM R      Created: Fri Sep 29, 2013  1:14 PM       Rielynn Trulson,            This patient needs something for migraine but has not responded to medications.  Offer her Reglan 10mg .  She can take it when she has a moderate-severe headache alone (or with tylenol).  This may help break a headache.  We can prescribe it as 1 tablet to take for headache, with 15 pills. ------

## 2013-09-29 NOTE — Telephone Encounter (Signed)
She is unable to take triptans or NSAIDs.  Excedrin is ineffective.  Other than opioids, which I don't typically prescribe in my scope of practice, I do not know what else I can offer her in terms of abortive therapy.    The primary focus is on reducing frequency of headaches and prevention.  I am happy to work with her in trying to achieve this, and there are still many options.  But if she absolutely needs something immediately for pain relief, then we would need to refer her to a headache specialist.

## 2013-09-29 NOTE — Telephone Encounter (Signed)
Returned call to patient she stated her B/P has been elevated today 155/94 pulse-62. Stated she has a headache.Stated she wanted to let Dr.Hochrein know and see what he would advise.Message sent to Dr.Hochrein.

## 2013-09-29 NOTE — Telephone Encounter (Signed)
Med filled for #120.

## 2013-09-29 NOTE — Telephone Encounter (Signed)
Patient is calling requesting a refill on her Topomax rx. States that we gave her #60 pills on her last refill and she was furious because she gets 120.

## 2013-09-29 NOTE — Telephone Encounter (Signed)
Pt aware and will pick up the Reglan tomorrow and call if this does not relieve her symptoms

## 2013-10-12 ENCOUNTER — Other Ambulatory Visit: Payer: Self-pay

## 2013-10-13 ENCOUNTER — Telehealth: Payer: Self-pay | Admitting: Neurology

## 2013-10-13 NOTE — Telephone Encounter (Signed)
Returned a call from [place] that she left for me on voicemail yesterday at 3:55 pm. She was wanting to consider getting Botox for her migraines and wanted [place] to call Medicare and get approval for her. I told her Botox for chronic migraines was not performed here. We went on to discuss her Topamax and she is going to call around November 19th with an update; may be able to increase if she is still having HA.

## 2013-10-26 ENCOUNTER — Other Ambulatory Visit: Payer: Self-pay | Admitting: Neurology

## 2013-10-26 MED ORDER — TOPIRAMATE 50 MG PO TABS: ORAL_TABLET | ORAL | Status: DC

## 2013-10-26 NOTE — Telephone Encounter (Signed)
We should be able to do Botox here for chronic migraine, so we should get pre-authorization for that.  In the meantime, I would increase topamax to 50mg  qAM and 100mg  qHS for 7 days, then 100mg  BID.

## 2013-10-26 NOTE — Telephone Encounter (Signed)
Received a message from the call-a-nurse via fax that the patient would like to increase her Topamax as she is having almost daily HA. She is curently talking 50 mg BID. **Dr. Everlena Cooper, please advise.

## 2013-10-26 NOTE — Telephone Encounter (Signed)
Spoke with the patient. Will increase the Topamax as recommended. New script to her pharmacy. Has a f/u with Dr. Everlena Cooper in December. She does not wish to go forward with the PA the Botox as she will only consider as a last resort for her migraines.

## 2013-11-01 ENCOUNTER — Ambulatory Visit: Payer: Medicare Other

## 2013-11-07 ENCOUNTER — Ambulatory Visit (INDEPENDENT_AMBULATORY_CARE_PROVIDER_SITE_OTHER): Payer: Medicare Other

## 2013-11-07 DIAGNOSIS — Z23 Encounter for immunization: Secondary | ICD-10-CM | POA: Diagnosis not present

## 2013-11-22 ENCOUNTER — Ambulatory Visit (INDEPENDENT_AMBULATORY_CARE_PROVIDER_SITE_OTHER): Payer: Medicare Other | Admitting: Neurology

## 2013-11-22 ENCOUNTER — Encounter: Payer: Self-pay | Admitting: Neurology

## 2013-11-22 VITALS — BP 110/72 | HR 68 | Temp 98.3°F | Ht 62.5 in | Wt 153.0 lb

## 2013-11-22 DIAGNOSIS — N289 Disorder of kidney and ureter, unspecified: Secondary | ICD-10-CM

## 2013-11-22 DIAGNOSIS — G43709 Chronic migraine without aura, not intractable, without status migrainosus: Secondary | ICD-10-CM

## 2013-11-22 DIAGNOSIS — IMO0002 Reserved for concepts with insufficient information to code with codable children: Secondary | ICD-10-CM

## 2013-11-22 DIAGNOSIS — R944 Abnormal results of kidney function studies: Secondary | ICD-10-CM

## 2013-11-22 LAB — BASIC METABOLIC PANEL
BUN: 11 mg/dL (ref 6–23)
CO2: 25 mEq/L (ref 19–32)
Calcium: 9.4 mg/dL (ref 8.4–10.5)
Chloride: 107 mEq/L (ref 96–112)
Creatinine, Ser: 0.9 mg/dL (ref 0.4–1.2)
GFR: 70.55 mL/min (ref >60.00)
Glucose, Bld: 111 mg/dL — ABNORMAL HIGH (ref 70–99)
Potassium: 3 mEq/L — ABNORMAL LOW (ref 3.5–5.1)
Sodium: 140 mEq/L (ref 135–145)

## 2013-11-22 NOTE — Patient Instructions (Signed)
I am so happy you are feeling better! 1.  We will continue the topamax 50mg  in morning and 100mg  at night.  Remember to drink plenty of fluids considering your kidney history and prior history of kidney stones. 2.  We will check kidney function in blood work 3.  Follow up in 6 months.  Call with questions or concerns.

## 2013-11-22 NOTE — Progress Notes (Signed)
NEUROLOGY FOLLOW UP OFFICE NOTE  Ebony Garcia [id number]  HISTORY OF PRESENT ILLNESS: Ebony Garcia is a 62 year old right-handed woman with hypertension, heart failure, renal calculi, depression, hyperlipidemia and recently diagnosed CHF (diastolic dysfunction) who follows up for migraine headache.  Records and images were personally reviewed where available.   Onset:  Since late 30s Location: usually left sided, periorbital, and back of head.  More recently, holocephalic Quality:  Sharp, aching pressure.  Non-throbbing Intensity:  7/10 Associated symptoms:  Nausea, photophobia and phonophobia.  No visual disturbance, osmophobia or vomiting Aura:  no Duration:  3 days Frequency:  Initially: Approximately 20-22 headache days/month.  Has been headache-free for past 5 days. Now:  Headache-free Activity:  Able to perform activities until she gets home Triggers/exacerbating factors:  stress Relieving factors:  Cold mask, heating pack on neck and shoulders  Past abortive therapy:  Tylenol (ineffective), ice & heat (helps some), Imitrex 100mg , Zomig (effective), Vicodin, Frova, indomethacin, DHE (effective), magnesium (ineffective), feverfew (ineffective), Midrin (effective but made sleepy), naproxen (ineffective), toradol (ineffective), Excedrin migraine (ineffective). Past preventative therapy:  propranolol (ineffective), Botox (tried once but gave flu symptoms), occipital nerve blocks, trigger point injections, amitriptyline (side effects), Depakote (hair loss), Effexor (for depression but no headache improvement on it)  Current abortive therapy: Nothing Current preventative therapy: Topamax 50mg  qAM and 100mg  QPM.    Alcohol:  no Caffeine:  1 cup coffee/day Sleep hygiene:  Sleeps well with Xanax and melatonin Stress/depression:  A lot of emotional stress concerning family Family history of headache:  Mother, grandmother, great-aunt, cousins.  08/16/08 MRI Brain w/wo  performed for "explosive headaches": nonspecific punctate hyperintensities in the subcortical white matter. 08/16/08 MRA Head: Unremarkable.  There is mild attenuation in the vertebrobasilar junction and proximal basilar artery which is likely artifact. 08/21/13: Na 141, K 4.0, Cl 107, glucose 100, BUN 17, Cr 0.90, Ca 9.5, GFR 69  Since last visit, we increased Topamax to 50mg  qAM and 100mg  qPM.  Since that time, she has been headache-free, the first time in many years.  She has been feeling well, the "best in 26 years."  She has been sure to drink plenty of water during the day.  PAST MEDICAL HISTORY: Past Medical History  Diagnosis Date  . Migraines   . Hypertension   . Diabetes mellitus without complication     Diet controlled  . CHF (congestive heart failure)   . Depression with anxiety   . Panic attacks     MEDICATIONS: Current Outpatient Prescriptions on File Prior to Visit  Medication Sig Dispense Refill  . ALPRAZolam (XANAX) 1 MG tablet Take 1 mg by mouth at bedtime as needed for sleep.      . benzoyl peroxide-erythromycin (BENZAMYCIN) gel Apply topically 2 (two) times daily as needed.      . Estradiol 0.52 MG/0.87 GM (0.06%) GEL Apply 1 application topically daily. 1 pump      . fluocinonide gel (LIDEX) 0.05 % Apply 1 application topically daily.      Marland Kitchen FLUoxetine (PROZAC) 20 MG capsule Take 20 mg by mouth daily.      . furosemide (LASIX) 20 MG tablet Take 1 tablet (20 mg total) by mouth daily.  30 tablet  5  . isometheptene-acetaminophen-dichloralphenazone (MIDRIN) 65-325-100 MG capsule Take 1 capsule by mouth 4 (four) times daily as needed. Maximum 5 capsules in 12 hours for migraine headaches, 8 capsules in 24 hours for tension headaches.  30 capsule  5  . lidocaine (  XYLOCAINE) 5 % ointment       . losartan (COZAAR) 50 MG tablet One daily  90 tablet  1  . metoCLOPramide (REGLAN) 10 MG tablet Take 1 tablet (10 mg total) by mouth daily as needed. For headaches  15 tablet  0  .  topiramate (TOPAMAX) 50 MG tablet Take 100 mg po BID  120 tablet  0   No current facility-administered medications on file prior to visit.    ALLERGIES: Allergies  Allergen Reactions  . Amoxicillin-Pot Clavulanate     REACTION: DIARRHEA  . Hydrocodone-Acetaminophen     REACTION: sensitive to  but can take for extreme pain per patient  . Propoxyphene Hcl     REACTION: nausea  . Propoxyphene-Acetaminophen     REACTION: nausea  . Sulfa Antibiotics     Unknown   . Lisinopril     Restless , irregular sleep    FAMILY HISTORY: Family History  Problem Relation Age of Onset  . Crohn's disease Mother   . Atrial fibrillation Mother   . Bladder Cancer Father   . Bipolar disorder Sister   . Bipolar disorder Brother   . Heart attack Maternal Grandmother     >65  . Diabetes Neg Hx   . Stroke Neg Hx   . Prostate cancer Father     SOCIAL HISTORY: History   Social History  . Marital Status: Divorced    Spouse Name: N/A    Number of Children: 3  . Years of Education: N/A   Occupational History  .     Social History Main Topics  . Smoking status: Never Smoker   . Smokeless tobacco: Never Used  . Alcohol Use: No  . Drug Use: No  . Sexual Activity: Not on file   Other Topics Concern  . Not on file   Social History Narrative   Lives alone.            REVIEW OF SYSTEMS: Constitutional: No fevers, chills, or sweats, no generalized fatigue, change in appetite Eyes: No visual changes, double vision, eye pain Ear, nose and throat: No hearing loss, ear pain, nasal congestion, sore throat Cardiovascular: No chest pain, palpitations Respiratory:  No shortness of breath at rest or with exertion, wheezes GastrointestinaI: No nausea, vomiting, diarrhea, abdominal pain, fecal incontinence Genitourinary:  No dysuria, urinary retention or frequency Musculoskeletal:  No neck pain, back pain Integumentary: No rash, pruritus, skin lesions Neurological: as above Psychiatric: No  depression, insomnia, anxiety Endocrine: No palpitations, fatigue, diaphoresis, mood swings, change in appetite, change in weight, increased thirst Hematologic/Lymphatic:  No anemia, purpura, petechiae. Allergic/Immunologic: no itchy/runny eyes, nasal congestion, recent allergic reactions, rashes  PHYSICAL EXAM: Filed Vitals:   11/22/13 1355  BP: 110/72  Pulse: 68  Temp: 98.3 F (36.8 C)   General: No acute distress Head:  Normocephalic/atraumatic Neck: supple, no paraspinal tenderness, full range of motion Heart:  Regular rate and rhythm Lungs:  Clear to auscultation bilaterally Back: No paraspinal tenderness Neurological Exam: alert and oriented to person, place, and time. Speech fluent and not dysarthric, language intact.  CN II-XII intact.  Bulk and tone normal, muscle strength 5/5 throughout.  Sensation to light touch intact.  Deep tendon reflexes 2+ throughout.  Finger to nose intact.  Gait normal, Romberg negative.  IMPRESSION: Chronic migraines, resolved at this time.  PLAN: 1.  Continue Topamax 50mg  qAM and 100mg  qPM.  Instructed to keep hydrated given her history of kidney stones and decreased GFR. 2.  Since  she has a history of decreased GFR, we will repeat a BMP.  BUN and Cr have been normal. 3.  Follow up in 6 months.  Shon Millet, DO  CC:  Marga Melnick, MD

## 2013-11-23 ENCOUNTER — Encounter: Payer: Self-pay | Admitting: Neurology

## 2013-11-25 ENCOUNTER — Encounter: Payer: Self-pay | Admitting: Internal Medicine

## 2013-12-05 ENCOUNTER — Encounter: Payer: Self-pay | Admitting: Internal Medicine

## 2013-12-12 ENCOUNTER — Ambulatory Visit: Payer: Medicare Other

## 2013-12-12 ENCOUNTER — Ambulatory Visit: Payer: Self-pay | Admitting: Internal Medicine

## 2013-12-18 ENCOUNTER — Other Ambulatory Visit: Payer: Self-pay | Admitting: Neurology

## 2013-12-18 MED ORDER — TOPIRAMATE 50 MG PO TABS: ORAL_TABLET | ORAL | Status: DC

## 2013-12-19 ENCOUNTER — Encounter: Payer: Self-pay | Admitting: Internal Medicine

## 2013-12-28 DIAGNOSIS — F331 Major depressive disorder, recurrent, moderate: Secondary | ICD-10-CM | POA: Diagnosis not present

## 2014-01-01 ENCOUNTER — Other Ambulatory Visit: Payer: Self-pay | Admitting: Internal Medicine

## 2014-01-01 NOTE — Telephone Encounter (Signed)
Furosemide refilled per protocol. JG//CMA 

## 2014-01-08 DIAGNOSIS — F331 Major depressive disorder, recurrent, moderate: Secondary | ICD-10-CM | POA: Diagnosis not present

## 2014-01-11 ENCOUNTER — Emergency Department (HOSPITAL_BASED_OUTPATIENT_CLINIC_OR_DEPARTMENT_OTHER)
Admission: EM | Admit: 2014-01-11 | Discharge: 2014-01-11 | Disposition: A | Discharge status: Home / Self Care | Payer: Medicare Other | Attending: Emergency Medicine | Admitting: Emergency Medicine

## 2014-01-11 ENCOUNTER — Encounter (HOSPITAL_BASED_OUTPATIENT_CLINIC_OR_DEPARTMENT_OTHER): Payer: Self-pay | Admitting: Emergency Medicine

## 2014-01-11 DIAGNOSIS — R079 Chest pain, unspecified: Secondary | ICD-10-CM | POA: Diagnosis not present

## 2014-01-11 DIAGNOSIS — G43909 Migraine, unspecified, not intractable, without status migrainosus: Secondary | ICD-10-CM | POA: Insufficient documentation

## 2014-01-11 DIAGNOSIS — F329 Major depressive disorder, single episode, unspecified: Secondary | ICD-10-CM | POA: Diagnosis not present

## 2014-01-11 DIAGNOSIS — R358 Other polyuria: Secondary | ICD-10-CM | POA: Insufficient documentation

## 2014-01-11 DIAGNOSIS — K117 Disturbances of salivary secretion: Secondary | ICD-10-CM | POA: Insufficient documentation

## 2014-01-11 DIAGNOSIS — R3589 Other polyuria: Secondary | ICD-10-CM | POA: Insufficient documentation

## 2014-01-11 DIAGNOSIS — I509 Heart failure, unspecified: Secondary | ICD-10-CM | POA: Diagnosis not present

## 2014-01-11 DIAGNOSIS — R0789 Other chest pain: Secondary | ICD-10-CM | POA: Diagnosis not present

## 2014-01-11 DIAGNOSIS — R42 Dizziness and giddiness: Secondary | ICD-10-CM | POA: Diagnosis not present

## 2014-01-11 DIAGNOSIS — F3289 Other specified depressive episodes: Secondary | ICD-10-CM | POA: Insufficient documentation

## 2014-01-11 DIAGNOSIS — Z79899 Other long term (current) drug therapy: Secondary | ICD-10-CM | POA: Insufficient documentation

## 2014-01-11 DIAGNOSIS — R002 Palpitations: Secondary | ICD-10-CM | POA: Diagnosis not present

## 2014-01-11 DIAGNOSIS — F489 Nonpsychotic mental disorder, unspecified: Secondary | ICD-10-CM | POA: Diagnosis not present

## 2014-01-11 DIAGNOSIS — R51 Headache: Secondary | ICD-10-CM | POA: Insufficient documentation

## 2014-01-11 DIAGNOSIS — F410 Panic disorder [episodic paroxysmal anxiety] without agoraphobia: Secondary | ICD-10-CM | POA: Diagnosis not present

## 2014-01-11 DIAGNOSIS — F29 Unspecified psychosis not due to a substance or known physiological condition: Secondary | ICD-10-CM | POA: Diagnosis not present

## 2014-01-11 DIAGNOSIS — E860 Dehydration: Secondary | ICD-10-CM | POA: Diagnosis not present

## 2014-01-11 DIAGNOSIS — T887XXA Unspecified adverse effect of drug or medicament, initial encounter: Secondary | ICD-10-CM | POA: Diagnosis not present

## 2014-01-11 DIAGNOSIS — E119 Type 2 diabetes mellitus without complications: Secondary | ICD-10-CM | POA: Diagnosis not present

## 2014-01-11 DIAGNOSIS — I10 Essential (primary) hypertension: Secondary | ICD-10-CM | POA: Insufficient documentation

## 2014-01-11 DIAGNOSIS — T43205A Adverse effect of unspecified antidepressants, initial encounter: Secondary | ICD-10-CM | POA: Insufficient documentation

## 2014-01-11 LAB — BASIC METABOLIC PANEL
BUN: 17 mg/dL (ref 6–23)
CO2: 23 mEq/L (ref 19–32)
Calcium: 9.4 mg/dL (ref 8.4–10.5)
Chloride: 101 mEq/L (ref 96–112)
Creatinine, Ser: 0.9 mg/dL (ref 0.50–1.10)
GFR calc Af Amer: 79 mL/min — ABNORMAL LOW (ref >90)
GFR calc non Af Amer: 68 mL/min — ABNORMAL LOW (ref >90)
Glucose, Bld: 112 mg/dL — ABNORMAL HIGH (ref 70–99)
Potassium: 3.6 mEq/L — ABNORMAL LOW (ref 3.7–5.3)
Sodium: 139 mEq/L (ref 137–147)

## 2014-01-11 LAB — URINALYSIS, ROUTINE W REFLEX MICROSCOPIC
Bilirubin Urine: NEGATIVE
Glucose, UA: NEGATIVE mg/dL
Hgb urine dipstick: NEGATIVE
Ketones, ur: NEGATIVE mg/dL
Leukocytes, UA: NEGATIVE
Nitrite: NEGATIVE
Protein, ur: NEGATIVE mg/dL
Specific Gravity, Urine: 1.005 (ref 1.005–1.030)
Urobilinogen, UA: 0.2 mg/dL (ref 0.0–1.0)
pH: 6.5 (ref 5.0–8.0)

## 2014-01-11 MED ORDER — SODIUM CHLORIDE 0.9 % IV SOLN
INTRAVENOUS | Status: DC
  Administered 2014-01-11: 21:00:00 via INTRAVENOUS

## 2014-01-11 NOTE — ED Notes (Signed)
In to talk with pt, pt on phone states she will "be off in a minute"-advised of need for UA and BR location

## 2014-01-11 NOTE — ED Notes (Signed)
EDNP and EDP were in with pt-advised by EDNP to take pt IV out before pt pulls it out-in to d/c, pt angry-called her mother and was telling her mother to come pick her up-NAD

## 2014-01-11 NOTE — ED Notes (Signed)
Pt informed when she can provide a urine sample to inform us.

## 2014-01-11 NOTE — Discharge Instructions (Signed)
Your EKG and blood work tonight were normal. They show no signs of dehydration or kidney problems. The symptoms you are having appear to be side effects to the medication you are taking. Call your doctor in the morning for a follow up appointment.

## 2014-01-11 NOTE — ED Notes (Signed)
Brought in by EMS for dry mouth requesting IV fluids " for dehydration" recent increased in anti depressant meds

## 2014-01-11 NOTE — ED Provider Notes (Signed)
Medical screening examination/treatment/procedure(s) were conducted as a shared visit with non-physician practitioner(s) and myself.  I personally evaluated the patient during the encounter.  EKG Interpretation    Date/Time:  Thursday January 11 2014 20:30:29 EST Ventricular Rate:  80 PR Interval:  160 QRS Duration: 98 QT Interval:  392 QTC Calculation: 452 R Axis:   48 Text Interpretation:  Normal sinus rhythm Nonspecific T wave abnormality Abnormal ECG No significant change since last tracing Confirmed by Tayler Heiden  MD, Edythe Riches ([id number]) on 01/11/2014 8:41:01 PM            Pt with numerous complaints of dry mouth, feeling jittery, polydipsia, polyuria and lightheadedness she feels are related to medications she is taking at home, in particular imipramine which was recently added. She has been reading about these medications and is worried she 'has too much serotonin'. She has been very demanding of patient and provider time and on my evaluation was argumentative when I attempted to ascertain what her exact symptoms were and what her main concerns are. I attempted to reassure her that her symptoms are likely relatively mild side effects and that her labs and EKG were reassuring this evening. She became verbally abusive. Offered to address any other concerns but she continued to be verbally abusive. She is not suicidal or homicidal. No evidence of acute psychosis, she reported 'seeing smoke' in the room earlier but that has resolved. Do not feel that she requires either medical or psychiatric admission. Not a danger to self or others. Pt informed that no further ED workup could be done at which time she became more agitated. Pt informed she would be discharged.   Tanea Moga B. Karle Starch, MD 01/11/14 2148

## 2014-01-11 NOTE — ED Provider Notes (Signed)
CSN: [id number]     Arrival date & time 01/11/14  1941 History   First MD Initiated Contact with Patient 01/11/14 1953     Chief Complaint  Patient presents with  . Dry mouth    (Consider location/radiation/quality/duration/timing/severity/associated sxs/prior Treatment) The history is provided by the patient.   Sherri T [place] is a 63 y.o. female who presents to the ED with dry mouth, jittery, increased amount of urine. Patient states she feels dizzy and felt like she was going to pass out today. Feels like her blood pressure has been up and down all day. She has taken her BP several times today and states that she records it each time. She denies pain except earlier she had chest discomfort. She called EMS to bring her to the ED. She recently started a new medication and thinks that may be the cause of her symptoms. She has been reading about imipramine and thinks all her problems are a results of this medication. She started the medication 2 weeks ago after seen her psychiatrist.    Past Medical History  Diagnosis Date  . Migraines   . Hypertension   . Diabetes mellitus without complication     Diet controlled  . CHF (congestive heart failure)   . Depression with anxiety   . Panic attacks    Past Surgical History  Procedure Laterality Date  . Total abdominal hysterectomy w/ bilateral salpingoophorectomy      for fibroids and migraines  . Tonsillectomy and adenoidectomy    . Nasal septum surgery    . Tubal ligation    . Dilation and curettage of uterus    . Papaloma Right    Family History  Problem Relation Age of Onset  . Crohn's disease Mother   . Atrial fibrillation Mother   . Bladder Cancer Father   . Bipolar disorder Sister   . Bipolar disorder Brother   . Heart attack Maternal Grandmother     >65  . Diabetes Neg Hx   . Stroke Neg Hx   . Prostate cancer Father    History  Substance Use Topics  . Smoking status: Never Smoker   . Smokeless tobacco: Never Used   . Alcohol Use: No   OB History   Grav Para Term Preterm Abortions TAB SAB Ect Mult Living                 Review of Systems  Constitutional: Negative for fever and chills.  Eyes: Negative for visual disturbance.  Respiratory: Positive for chest tightness. Negative for shortness of breath.   Cardiovascular: Positive for chest pain and palpitations.  Gastrointestinal: Negative for nausea, vomiting, abdominal pain, diarrhea and constipation.  Genitourinary: Positive for frequency. Negative for dysuria and urgency.  Musculoskeletal: Negative for back pain and neck pain.  Skin: Negative for rash.  Neurological: Positive for light-headedness and headaches. Negative for syncope.  Psychiatric/Behavioral: Negative for confusion. The patient is nervous/anxious.     Allergies  Amoxicillin-pot clavulanate; Hydrocodone-acetaminophen; Propoxyphene hcl; Propoxyphene n-acetaminophen; Sulfa antibiotics; and Lisinopril  Home Medications   Current Outpatient Rx  Name  Route  Sig  Dispense  Refill  . imipramine (TOFRANIL) 50 MG tablet   Oral   Take 100 mg by mouth at bedtime.         . ALPRAZolam (XANAX) 1 MG tablet   Oral   Take 1 mg by mouth at bedtime as needed for sleep.         . benzoyl peroxide-erythromycin (BENZAMYCIN)  gel   Topical   Apply topically 2 (two) times daily as needed.         . Estradiol 0.52 MG/0.87 GM (0.06%) GEL   Topical   Apply 1 application topically daily. 1 pump         . fluocinonide gel (LIDEX) 0.05 %   Topical   Apply 1 application topically daily.         Marland Kitchen FLUoxetine (PROZAC) 20 MG capsule   Oral   Take 20 mg by mouth daily.         . furosemide (LASIX) 20 MG tablet      TAKE ONE TABLET BY MOUTH ONCE DAILY   30 tablet   3   . isometheptene-acetaminophen-dichloralphenazone (MIDRIN) 65-325-100 MG capsule   Oral   Take 1 capsule by mouth 4 (four) times daily as needed. Maximum 5 capsules in 12 hours for migraine headaches, 8 capsules  in 24 hours for tension headaches.   30 capsule   5   . lidocaine (XYLOCAINE) 5 % ointment               . losartan (COZAAR) 50 MG tablet      One daily   90 tablet   1     Dose increase form 1/2 to 1 by mouth daily-FYI   . metoCLOPramide (REGLAN) 10 MG tablet   Oral   Take 1 tablet (10 mg total) by mouth daily as needed. For headaches   15 tablet   0   . topiramate (TOPAMAX) 50 MG tablet      Take 50 mg every morning and 100 mg every night   90 tablet   3    BP 152/91  Pulse 77  Temp(Src) 98.1 F (36.7 C) (Oral)  Resp 20  Wt 153 lb (69.4 kg)  SpO2 100% Physical Exam  Nursing note and vitals reviewed. Constitutional: She is oriented to person, place, and time. She appears well-developed and well-nourished. No distress.  Patient talking continuously and appears anxious.  HENT:  Head: Normocephalic and atraumatic.  Eyes: Conjunctivae and EOM are normal.  Neck: Normal range of motion. Neck supple.  Cardiovascular: Normal rate and regular rhythm.   Pulmonary/Chest: Effort normal. She has no wheezes. She has no rales.  Abdominal: Soft. Bowel sounds are normal. There is no tenderness.  Musculoskeletal: Normal range of motion.  Good grips and equal bilateral. Good strength lower extremities, adequate circulation, good touch sensation.  Neurological: She is alert and oriented to person, place, and time. She has normal strength. No cranial nerve deficit or sensory deficit.  Skin: Skin is warm and dry.  Psychiatric: She has a normal mood and affect. Her behavior is normal.    ED Course  Procedures Went in to discuss lab findings with the patient and she states she is seeing clouds of smoke in the room and is anxious and needs her mother.  MDM: Dr. Karle Starch in to see the patient and discuss her labs and EKG results. The patient became upset and would not let Dr. Karle Starch explain. Patient continued to interrupt. Dr. Karle Starch ask patient if she is comfortable being  discharge home or if she need admission and talk with [facility]. Patient became more upset and wanted to leave. She states she wants her mother.   63 y.o. female with possible side effect of new medication. Stable for discharge.  She is to follow up as soon as possible with her doctor.   [place],  NP 01/11/14 2300

## 2014-01-12 ENCOUNTER — Encounter: Payer: Self-pay | Admitting: Internal Medicine

## 2014-01-12 ENCOUNTER — Ambulatory Visit (INDEPENDENT_AMBULATORY_CARE_PROVIDER_SITE_OTHER): Payer: Medicare Other | Admitting: Internal Medicine

## 2014-01-12 VITALS — BP 158/90 | HR 93 | Temp 97.8°F | Resp 14

## 2014-01-12 DIAGNOSIS — E860 Dehydration: Secondary | ICD-10-CM | POA: Diagnosis not present

## 2014-01-12 MED ORDER — ONDANSETRON HCL 4 MG PO TABS: 4.0000 mg | ORAL_TABLET | Freq: Three times a day (TID) | ORAL | Status: DC | PRN

## 2014-01-12 NOTE — Progress Notes (Signed)
Pre visit review using our clinic review tool, if applicable. No additional management support is needed unless otherwise documented below in the visit note. 

## 2014-01-12 NOTE — Patient Instructions (Signed)
Stay on clear liquids for 48-72 hours or until symptoms.This would include  jello, sherbert (NOT ice cream), Lipton's chicken noodle soup(NOT cream based soups),Gatorade Lite, flat Ginger ale (without High Fructose Corn Syrup),dry toast or crackers, baked potato.No milk , dairy or grease until well.

## 2014-01-12 NOTE — Progress Notes (Signed)
   Subjective:    Patient ID: Ebony Garcia, female    DOB: May 19, 1951, 63 y.o.   MRN: [id number]  HPI The emergency room records & labs were reviewed. The symptoms as recorded by the nurse practitioner were also reviewed. She feels that her symptoms were related to imipramine resulting in dehydration.  Her creatinine was 0.9 and BUN 17; but the GFR was mildly reduced at 68. Her sodium was 139; typically it is in the low 140s. Her potassium was low normal at 3.6.  Symptoms were associated with sleep deprivation and onset of migraine headache.  She is being followed by Dr. Tomi Likens; he was prescribed Midrin on an as-needed basis for migraines. This is not available however.  She states that she has been drinking water but also having polyuria. She denies associated nausea or vomiting.      Review of Systems The imipramine was prescribed by Maylene Roes; the last dose was approximately 48 hours ago. Seroquel was recommended in its place; she does not want to take this based on the potential adverse effects associated with this medication.     Objective:   Physical Exam   She is obviously uncomfortable; she appears adequately nourished.  No scleral icterus is present.  Her tongue is dry visibly. The oropharynx is also dry  She has no lymphadenopathy about the neck or axilla  Chest is clear to auscultation.  Slow gallop cadence is present  Bowel sounds are decreased. There is no tenderness.  No jaundice is noted. She does have some tenting        Assessment & Plan:  #1 clinical signs of dehydration with dry tongue and dry oropharynx. Also she has some tenting of the skin.  Plan: She should replete fluids with electrolyte containing beverages as well as plain water to prevent hyponatremia. Antinausea medication will not be necessary at this time but will be provided should nausea or vomiting occur.

## 2014-01-14 ENCOUNTER — Encounter: Payer: Self-pay | Admitting: Internal Medicine

## 2014-01-15 ENCOUNTER — Ambulatory Visit: Payer: Medicare Other | Admitting: Internal Medicine

## 2014-01-16 ENCOUNTER — Ambulatory Visit (INDEPENDENT_AMBULATORY_CARE_PROVIDER_SITE_OTHER): Payer: Medicare Other | Admitting: Neurology

## 2014-01-16 ENCOUNTER — Encounter: Payer: Self-pay | Admitting: Internal Medicine

## 2014-01-16 ENCOUNTER — Encounter: Payer: Self-pay | Admitting: Neurology

## 2014-01-16 VITALS — BP 124/80 | HR 84 | Temp 98.1°F | Resp 16 | Ht 62.5 in | Wt 144.4 lb

## 2014-01-16 DIAGNOSIS — E860 Dehydration: Secondary | ICD-10-CM | POA: Diagnosis not present

## 2014-01-16 DIAGNOSIS — G43009 Migraine without aura, not intractable, without status migrainosus: Secondary | ICD-10-CM | POA: Diagnosis not present

## 2014-01-16 NOTE — Progress Notes (Addendum)
NEUROLOGY FOLLOW UP OFFICE NOTE  Ebony Garcia [id number]  HISTORY OF PRESENT ILLNESS: Ebony Garcia is a 63 year old right-handed woman with hypertension, heart failure, renal calculi, depression, hyperlipidemia and recently diagnosed CHF (diastolic dysfunction) who follows up for migraine headache.  Records and images were personally reviewed where available.   UPDATE: At last visit, in December, she was doing well.  Since that time, she has had an increased panic attacks and sleep deprivation.  She was started on imipramine in addition to to her fluoxetine by her psychiatrist to help with sleep .  She presented to the ED on 01/11/14 for dry mouth, jitteriness, chest discomfort, polydipsia, polyuria, dizziness and lightheadedness.  EKG was unremarkable.  BUN 17 and Cr 0.9.  GFR was 68.  Na was 139.  K 3.6.  She was concerned about serotonin syndrome.  It was felt that the imipramine contributed to dehydration.  She saw her PCP, Dr. Linna Darner, on 01/12/14.  She had developed a severe pounding migraine that day.  He recommended increasing electrolyte-containing beverages and water.  Imipramine was discontinued.  It turned out that she drank decaff coffee instead of regular.  So she had a cup of regular coffee and the migraine resolved.  Otherwise, she has not had any other migraines since last visit.  Onset:  Since late 61s Location: usually left sided, periorbital, and back of head.  More recently, holocephalic Quality:  Sharp, aching pressure.  Non-throbbing Intensity:  7/10 Associated symptoms:  Nausea, photophobia and phonophobia.  No visual disturbance, osmophobia or vomiting Aura:  no Duration:  3 days Triggers/exacerbating factors:  stress Relieving factors:  Cold mask, heating pack on neck and shoulders  Past abortive therapy:  Tylenol (ineffective), ice & heat (helps some), Imitrex 100mg , Zomig (effective), Vicodin, Frova, indomethacin, DHE (effective), magnesium (ineffective),  feverfew (ineffective), Midrin (effective but made sleepy), naproxen (ineffective), toradol (ineffective), Excedrin migraine (ineffective).  Past preventative therapy:  propranolol (ineffective), Botox (tried once but gave flu symptoms), occipital nerve blocks, trigger point injections, amitriptyline (side effects), Depakote (hair loss), Effexor (for depression but no headache improvement on it)  Current abortive therapy: Nothing Current preventative therapy: Topamax 50mg  qAM and 100mg  QPM.     Alcohol:  no Caffeine:  1 cup coffee/day Sleep hygiene:  Sleeps well with Xanax and melatonin Stress/depression:  A lot of emotional stress concerning family Family history of headache:  Mother, grandmother, great-aunt, cousins.   08/16/08 MRI Brain w/wo performed for "explosive headaches": nonspecific punctate hyperintensities in the subcortical white matter. 08/16/08 MRA Head: Unremarkable.  There is mild attenuation in the vertebrobasilar junction and proximal basilar artery which is likely artifact. 08/21/13: Na 141, K 4.0, Cl 107, glucose 100, BUN 17, Cr 0.90, Ca 9.5, GFR 69  Taking topamax 50mg  in AM and 100mg  at night.  PAST MEDICAL HISTORY: Past Medical History  Diagnosis Date  . Migraines   . Hypertension   . Diabetes mellitus without complication     Diet controlled  . CHF (congestive heart failure)   . Depression with anxiety   . Panic attacks     MEDICATIONS: Current Outpatient Prescriptions on File Prior to Visit  Medication Sig Dispense Refill  . ALPRAZolam (XANAX) 1 MG tablet Take 1 mg by mouth at bedtime as needed for sleep.      . benzoyl peroxide-erythromycin (BENZAMYCIN) gel Apply topically 2 (two) times daily as needed.      . Estradiol 0.52 MG/0.87 GM (0.06%) GEL Apply 1 application topically daily.  1 pump      . fluocinonide gel (LIDEX) 9.56 % Apply 1 application topically daily.      Marland Kitchen FLUoxetine (PROZAC) 20 MG capsule Take 20 mg by mouth daily.      . furosemide  (LASIX) 20 MG tablet TAKE ONE TABLET BY MOUTH ONCE DAILY  30 tablet  3  . lidocaine (XYLOCAINE) 5 % ointment       . losartan (COZAAR) 50 MG tablet One daily  90 tablet  1  . ondansetron (ZOFRAN) 4 MG tablet Take 1 tablet (4 mg total) by mouth every 8 (eight) hours as needed for nausea or vomiting.  6 tablet  0  . topiramate (TOPAMAX) 50 MG tablet Take 50 mg every morning and 100 mg every night  90 tablet  3   No current facility-administered medications on file prior to visit.    ALLERGIES: Allergies  Allergen Reactions  . Amoxicillin-Pot Clavulanate     REACTION: DIARRHEA  . Hydrocodone-Acetaminophen     REACTION: sensitive to  but can take for extreme pain per patient  . Propoxyphene Hcl     REACTION: nausea  . Propoxyphene N-Acetaminophen     REACTION: nausea  . Sulfa Antibiotics     Unknown   . Lisinopril     Restless , irregular sleep    FAMILY HISTORY: Family History  Problem Relation Age of Onset  . Crohn's disease Mother   . Atrial fibrillation Mother   . Bladder Cancer Father   . Bipolar disorder Sister   . Bipolar disorder Brother   . Heart attack Maternal Grandmother     >65  . Diabetes Neg Hx   . Stroke Neg Hx   . Prostate cancer Father     SOCIAL HISTORY: History   Social History  . Marital Status: Divorced    Spouse Name: N/A    Number of Children: 3  . Years of Education: N/A   Occupational History  .     Social History Main Topics  . Smoking status: Never Smoker   . Smokeless tobacco: Never Used  . Alcohol Use: No  . Drug Use: No  . Sexual Activity: Not on file   Other Topics Concern  . Not on file   Social History Narrative   Lives alone.            REVIEW OF SYSTEMS: Constitutional: No fevers, chills, or sweats, no generalized fatigue, change in appetite Eyes: No visual changes, double vision, eye pain Ear, nose and throat: No hearing loss, ear pain, nasal congestion, sore throat Cardiovascular: No chest pain,  palpitations Respiratory:  No shortness of breath at rest or with exertion, wheezes GastrointestinaI: No nausea, vomiting, diarrhea, abdominal pain, fecal incontinence Genitourinary:  No dysuria, urinary retention or frequency Musculoskeletal:  No neck pain, back pain Integumentary: No rash, pruritus, skin lesions Neurological: as above Psychiatric: No depression, insomnia, anxiety Endocrine: No palpitations, fatigue, diaphoresis, mood swings, change in appetite, change in weight, increased thirst Hematologic/Lymphatic:  No anemia, purpura, petechiae. Allergic/Immunologic: no itchy/runny eyes, nasal congestion, recent allergic reactions, rashes  PHYSICAL EXAM: Filed Vitals:   01/16/14 1129  BP: 124/80  Pulse: 84  Temp: 98.1 F (36.7 C)  Resp: 16   General: No acute distress Head:  Normocephalic/atraumatic Neck: supple, no paraspinal tenderness, full range of motion Heart:  Regular rate and rhythm Lungs:  Clear to auscultation bilaterally Back: No paraspinal tenderness Neurological Exam: alert and oriented to person, place, and time. Speech fluent and  not dysarthric, language intact.  CN II-XII intact. Fundoscopic exam unremarkable, no papilledema.  Bulk and tone normal, muscle strength 5/5 throughout.  Sensation to light touch  intact.  Deep tendon reflexes 2+ throughout.  Finger to nose intact.  Gait normal, Romberg negative.  IMPRESSION: 1.  Migraine without aura, controlled.  Migraine last week likely triggered by dehydration and caffeine withdrawal. 2.  Dehydration.  PLAN: 1.  Continue topamax 50mg  qAM and 100mg  qhs.  Stay hydrated. 2.  Continue hydration and electrolytes intake as per Dr. Linna Darner 3.  Follow up in June or as needed.  30 minutes spent with patient, over 50% spent counseling and coordinating care.  Metta Clines, DO  CC:  Unice Cobble, MD

## 2014-01-18 DIAGNOSIS — F331 Major depressive disorder, recurrent, moderate: Secondary | ICD-10-CM | POA: Diagnosis not present

## 2014-02-08 DIAGNOSIS — F330 Major depressive disorder, recurrent, mild: Secondary | ICD-10-CM | POA: Diagnosis not present

## 2014-02-12 DIAGNOSIS — F331 Major depressive disorder, recurrent, moderate: Secondary | ICD-10-CM | POA: Diagnosis not present

## 2014-03-01 DIAGNOSIS — F330 Major depressive disorder, recurrent, mild: Secondary | ICD-10-CM | POA: Diagnosis not present

## 2014-03-08 ENCOUNTER — Telehealth: Payer: Self-pay | Admitting: Internal Medicine

## 2014-03-08 DIAGNOSIS — I10 Essential (primary) hypertension: Secondary | ICD-10-CM

## 2014-03-08 MED ORDER — LOSARTAN POTASSIUM 50 MG PO TABS: ORAL_TABLET | ORAL | Status: DC

## 2014-03-08 NOTE — Telephone Encounter (Signed)
Patient states that she needs a new rx called in to her [facility] with updated instructions for her losartan (COZAAR) 50 MG tablet. They still have old instructions that instruct her to take 1/2 pill and she is not able to pick up enough pills. She now take 1 whole pill and needs new rx sent to [facility].

## 2014-03-08 NOTE — Telephone Encounter (Signed)
Rx sent to the pharmacy by e-script.//AB/CMA 

## 2014-03-12 ENCOUNTER — Telehealth: Payer: Self-pay | Admitting: Internal Medicine

## 2014-03-12 NOTE — Telephone Encounter (Signed)
Relevant patient education assigned to patient using Emmi. ° °

## 2014-03-22 DIAGNOSIS — F330 Major depressive disorder, recurrent, mild: Secondary | ICD-10-CM | POA: Diagnosis not present

## 2014-03-23 ENCOUNTER — Telehealth: Payer: Self-pay | Admitting: Neurology

## 2014-03-23 MED ORDER — TIZANIDINE HCL 2 MG PO CAPS: 2.0000 mg | ORAL_CAPSULE | Freq: Three times a day (TID) | ORAL | Status: DC

## 2014-03-23 NOTE — Telephone Encounter (Signed)
Called and spoke with patient. She states she is not having migraines- the Topamax is controlling this. She is staying hydrated. She reports a 3 day history of pain that starts at the base of her left neck and travels to her scalp. She states it is a constant soreness, but has had episodes of burning and tightness. She also wanted to let us know that she has recently felt like she is having a "total meltdown". She is seeing a therapist once a week and has been placed on lamictal. She is on her second week of this so is currently taking 50 mg daily. She is being titrated up to 100 mg. She states constant anxiety attacks with crying daily. She just wanted to make [place] aware that this is also going on but that her pain is very real. Please advise.

## 2014-03-23 NOTE — Telephone Encounter (Signed)
[phone] pt states she is having bad pain in the back left hand side of the head and it is has been there for 3 day please call

## 2014-03-23 NOTE — Telephone Encounter (Signed)
It sounds like muscle tension in her neck.  We can give her tizanidine 2mg  every 6 to 8 hours as needed, #30, R0.  She should take caution when driving since it may cause drowsiness.

## 2014-03-23 NOTE — Telephone Encounter (Signed)
Patient made aware. RX sent and she will let us know if this helps.

## 2014-03-26 ENCOUNTER — Telehealth: Payer: Self-pay | Admitting: Neurology

## 2014-03-26 NOTE — Telephone Encounter (Signed)
Patient states she is still having a headache she is going to try to take the tizanidine every 6 hours. She also states she thinks one of her medication is causing the headache to be worse  She will contact the Dr that was prescribing this for her. I also ask that she call me back later in the week and let me know how she is doing.

## 2014-03-26 NOTE — Telephone Encounter (Signed)
Jaffe pt, says she was supposed to call Luvenia Starch today? Please call [phone] / Sherri S.

## 2014-03-27 ENCOUNTER — Ambulatory Visit: Payer: Medicare Other | Admitting: *Deleted

## 2014-03-27 ENCOUNTER — Telehealth: Payer: Self-pay | Admitting: Neurology

## 2014-03-27 ENCOUNTER — Ambulatory Visit: Payer: Medicare Other | Admitting: Neurology

## 2014-03-27 ENCOUNTER — Telehealth: Payer: Self-pay | Admitting: *Deleted

## 2014-03-27 DIAGNOSIS — R51 Headache: Secondary | ICD-10-CM

## 2014-03-27 MED ORDER — KETOROLAC TROMETHAMINE 60 MG/2ML IM SOLN
60.0000 mg | Freq: Once | INTRAMUSCULAR | Status: AC
  Administered 2014-03-27: 60 mg via INTRAMUSCULAR

## 2014-03-27 NOTE — Telephone Encounter (Signed)
Pt calling with severe migraine. Requesting something for pain. Please call ASAP. [phone]# [phone] / Sherri S.

## 2014-03-27 NOTE — Telephone Encounter (Signed)
What has she tried in the past, per notes she has Midrin prn.  Has she taken this?  If yes and still having severe headache, would go to ER. Thanks

## 2014-03-27 NOTE — Telephone Encounter (Signed)
Patient coming for Toradol injection.

## 2014-03-27 NOTE — Telephone Encounter (Signed)
Noted. Will forward to Dr. Tomi Likens, fyi. Thanks

## 2014-03-27 NOTE — Telephone Encounter (Signed)
Left message we can give patient a toradol injection if she would like to try this  For the headache .

## 2014-03-27 NOTE — Telephone Encounter (Signed)
Please advise on below note  I just tried to call her however did not get answer  To see the pain scale of headache

## 2014-03-27 NOTE — Telephone Encounter (Signed)
Patients states they have taken Midrin off the market. And she did not want to go to ER

## 2014-03-28 ENCOUNTER — Telehealth: Payer: Self-pay | Admitting: Neurology

## 2014-03-28 ENCOUNTER — Telehealth: Payer: Self-pay | Admitting: *Deleted

## 2014-03-28 NOTE — Telephone Encounter (Signed)
Left message for patient to call office back . I am returning her call from earlier

## 2014-03-28 NOTE — Telephone Encounter (Signed)
Patient called stating the Toradol injection given on 03/27/14  gave her complete relief

## 2014-03-28 NOTE — Telephone Encounter (Signed)
Pt would like to talk to you please call cell phone

## 2014-04-05 DIAGNOSIS — F331 Major depressive disorder, recurrent, moderate: Secondary | ICD-10-CM | POA: Diagnosis not present

## 2014-04-17 ENCOUNTER — Encounter: Payer: Self-pay | Admitting: Cardiology

## 2014-04-17 ENCOUNTER — Ambulatory Visit (INDEPENDENT_AMBULATORY_CARE_PROVIDER_SITE_OTHER): Payer: Medicare Other | Admitting: Cardiology

## 2014-04-17 VITALS — BP 130/90 | HR 72 | Ht 62.5 in | Wt 143.0 lb

## 2014-04-17 DIAGNOSIS — R002 Palpitations: Secondary | ICD-10-CM

## 2014-04-17 DIAGNOSIS — I10 Essential (primary) hypertension: Secondary | ICD-10-CM | POA: Diagnosis not present

## 2014-04-17 HISTORY — DX: Palpitations: R00.2

## 2014-04-17 MED ORDER — DILTIAZEM HCL ER COATED BEADS 120 MG PO CP24: 120.0000 mg | ORAL_CAPSULE | Freq: Every day | ORAL | Status: DC

## 2014-04-17 NOTE — Progress Notes (Signed)
HPI The patient presents for follow up of dyspnea and chest pain.  I saw her last year.  She seemed to have some mild HF with preserved EF.  She did have a negative stress test last July.  She presents for follow up.  She has had lots of issues with depression and anxiety.  She is getting treatment for this is been a very difficult time.  She describes multiple somatic complaints. She's been having palpitations. She feels every heartbeat. She's been having chest discomfort similar to that which she described previously. This is around her right and left chest and through to her back. It is sporadic. It is not necessarily with exercise.  She says her blood have been controlled. She's very good about watching her salt and fluid intake. She has rarely had taken Lasix.  Allergies  Allergen Reactions  . Amoxicillin-Pot Clavulanate     REACTION: DIARRHEA  . Hydrocodone-Acetaminophen     REACTION: sensitive to  but can take for extreme pain per patient  . Propoxyphene Hcl     REACTION: nausea  . Propoxyphene N-Acetaminophen     REACTION: nausea  . Sulfa Antibiotics     Unknown   . Lisinopril     Restless , irregular sleep    Current Outpatient Prescriptions  Medication Sig Dispense Refill  . ALPRAZolam (XANAX) 1 MG tablet Take 1 mg by mouth at bedtime as needed for sleep.      . benzoyl peroxide-erythromycin (BENZAMYCIN) gel Apply topically 2 (two) times daily as needed.      . Estradiol 0.52 MG/0.87 GM (0.06%) GEL Apply 1 application topically daily. 1 pump      . fluocinonide gel (LIDEX) 0.34 % Apply 1 application topically daily.      Marland Kitchen FLUoxetine (PROZAC) 20 MG capsule Take 20 mg by mouth daily.      . furosemide (LASIX) 20 MG tablet TAKE ONE TABLET BY MOUTH ONCE DAILY  30 tablet  3  . lidocaine (XYLOCAINE) 5 % ointment       . losartan (COZAAR) 50 MG tablet TAKE 1 TABLET BY MOUTH DAILY.  90 tablet  1  . ondansetron (ZOFRAN) 4 MG tablet Take 1 tablet (4 mg total) by mouth every 8  (eight) hours as needed for nausea or vomiting.  6 tablet  0  . tizanidine (ZANAFLEX) 2 MG capsule Take 1 capsule (2 mg total) by mouth 3 (three) times daily.  30 capsule  0  . topiramate (TOPAMAX) 50 MG tablet Take 50 mg every morning and 100 mg every night  90 tablet  3   No current facility-administered medications for this visit.    Past Medical History  Diagnosis Date  . Migraines   . Hypertension   . Diabetes mellitus without complication     Diet controlled  . CHF (congestive heart failure)   . Depression with anxiety   . Panic attacks     Past Surgical History  Procedure Laterality Date  . Total abdominal hysterectomy w/ bilateral salpingoophorectomy      for fibroids and migraines  . Tonsillectomy and adenoidectomy    . Nasal septum surgery    . Tubal ligation    . Dilation and curettage of uterus    . Papaloma Right     ROS:  Positive tearfulness, migraines, joint pains. 04/17/2014  PHYSICAL EXAM BP 130/90  Pulse 72  Ht 5' 2.5" (1.588 m)  Wt 143 lb (64.864 kg)  BMI 25.72 kg/m2 GENERAL:  Well appearing HEENT:  Pupils equal round and reactive, fundi not visualized, oral mucosa unremarkable NECK:  No jugular venous distention, waveform within normal limits, carotid upstroke brisk and symmetric, no bruits, no thyromegaly LYMPHATICS:  No cervical, inguinal adenopathy LUNGS:  Clear to auscultation bilaterally BACK:  No CVA tenderness CHEST:  Unremarkable HEART:  PMI not displaced or sustained,S1 and S2 within normal limits, no S3, no S4, no clicks, no rubs, no murmurs ABD:  Flat, positive bowel sounds normal in frequency in pitch, no bruits, no rebound, no guarding, no midline pulsatile mass, no hepatomegaly, no splenomegaly EXT:  2 plus pulses throughout, no edema, no cyanosis no clubbing SKIN:  No rashes no nodules NEURO:  Cranial nerves II through XII grossly intact, motor grossly intact throughout PSYCH:  Cognitively intact, oriented to person place and  time   EKG:  Sinus rhythm, rate 72, axis within normal limits, intervals within normal limits, no acute ST-T wave changes. Inferolateral T wave inversion nonspecific. Is  04/17/2014  ASSESSMENT AND PLAN  DIASTOLIC HF:  She is very aware of fluid management and seems to be doing well with this. She seems to be euvolemic today. No change in therapy is indicated.  CHEST PAIN:  This is atypical. She had a negative stress test last year. At this point no further cardiovascular testing is indicated. I will follow this if she has increasing symptoms.  PALPITATIONS:  Given these I will switch her to Cardizem CD and stop the Cozaar.    HTN:  This will be managed as above.

## 2014-04-17 NOTE — Patient Instructions (Signed)
Your physician has recommended you make the following change in your medication:  1) STOP Cozaar 2) START Cardizem 120mg  daily. An Rx has been sent to your pharmacy  Take all other medications as prescribed  Your physician recommends that you schedule a follow-up appointment in: 6 weeks with Truitt Merle, NP

## 2014-04-18 ENCOUNTER — Other Ambulatory Visit: Payer: Self-pay | Admitting: Neurology

## 2014-04-19 DIAGNOSIS — F330 Major depressive disorder, recurrent, mild: Secondary | ICD-10-CM | POA: Diagnosis not present

## 2014-04-20 ENCOUNTER — Encounter: Payer: Self-pay | Admitting: Cardiology

## 2014-05-03 NOTE — Progress Notes (Signed)
error 

## 2014-05-08 DIAGNOSIS — F330 Major depressive disorder, recurrent, mild: Secondary | ICD-10-CM | POA: Diagnosis not present

## 2014-05-17 ENCOUNTER — Other Ambulatory Visit: Payer: Self-pay | Admitting: Neurology

## 2014-05-23 ENCOUNTER — Encounter: Payer: Self-pay | Admitting: Neurology

## 2014-05-23 ENCOUNTER — Ambulatory Visit (INDEPENDENT_AMBULATORY_CARE_PROVIDER_SITE_OTHER): Payer: Medicare Other | Admitting: Neurology

## 2014-05-23 VITALS — BP 98/66 | HR 66 | Temp 98.2°F | Resp 16 | Ht 62.0 in | Wt 141.0 lb

## 2014-05-23 DIAGNOSIS — G43009 Migraine without aura, not intractable, without status migrainosus: Secondary | ICD-10-CM | POA: Diagnosis not present

## 2014-05-23 NOTE — Patient Instructions (Signed)
1.  Continue the Topamax 2.  If you get another headache and nothing seems to work, call and we can consider Lidocaine nasal drops. 3.  Follow up in 9 months.

## 2014-05-23 NOTE — Progress Notes (Signed)
NEUROLOGY FOLLOW UP OFFICE NOTE  Ebony Garcia [id number]  HISTORY OF PRESENT ILLNESS: Ebony Garcia is a 63 year old right-handed woman with hypertension, heart failure, renal calculi, depression, hyperlipidemia and recently diagnosed CHF (diastolic dysfunction) who follows up for migraine headache.   UPDATE: No headaches since last visit.  Occasionally, notes seeing spots in her vision for the past few months.    Current abortive therapy:  Toradol 60mg  IM effective) Current preventative therapy:  topiramate 50mg  AM & 100mg  HS Alcohol:  no Caffeine:  1 cup coffee/day Sleep hygiene:  Sleeps well with Xanax and melatonin Stress/depression:  A lot of emotional stress concerning family but doing better Exercise:  Walks regularly at the mall.  HISTORY: Onset:  Since late 61s Location: usually left sided, periorbital, and back of head.  More recently, holocephalic Quality:  Sharp, aching pressure.  Non-throbbing Initial intensity:  7/10 Associated symptoms:  Nausea, photophobia and phonophobia.  No visual disturbance, osmophobia or vomiting Aura:  no Initial duration:  3 days Initial frequency:  20-22 headache days per month (or 5 headache free days per month) Triggers/exacerbating factors:  stress Relieving factors:  Cold mask, heating pack on neck and shoulders  Past abortive therapy:  Tylenol (ineffective), ice & heat (helps some), Imitrex 100mg , Zomig (effective), Vicodin, Frova, indomethacin, DHE (effective), magnesium (ineffective), feverfew (ineffective), Midrin (effective but made sleepy), naproxen (ineffective), toradol (ineffective), Excedrin migraine (ineffective).  Past preventative therapy:  propranolol (ineffective), Botox (tried once but gave flu symptoms), occipital nerve blocks, trigger point injections, amitriptyline (side effects), Depakote (hair loss), Effexor (for depression but no headache improvement on it)  Family history of headache:  Mother,  grandmother, great-aunt, cousins.  08/16/08 MRI Brain w/wo performed for "explosive headaches": nonspecific punctate hyperintensities in the subcortical white matter. 08/16/08 MRA Head: Unremarkable.  There is mild attenuation in the vertebrobasilar junction and proximal basilar artery which is likely artifact. 08/21/13: Na 141, K 4.0, Cl 107, glucose 100, BUN 17, Cr 0.90, Ca 9.5, GFR 69  PAST MEDICAL HISTORY: Past Medical History  Diagnosis Date  . Migraines   . Hypertension   . Diabetes mellitus without complication     Diet controlled  . CHF (congestive heart failure)   . Depression with anxiety   . Panic attacks     MEDICATIONS: Current Outpatient Prescriptions on File Prior to Visit  Medication Sig Dispense Refill  . ALPRAZolam (XANAX) 1 MG tablet Take 1 mg by mouth at bedtime as needed for sleep.      . benzoyl peroxide-erythromycin (BENZAMYCIN) gel Apply topically 2 (two) times daily as needed.      . Estradiol 0.52 MG/0.87 GM (0.06%) GEL Apply 1 application topically daily. 1 pump      . fluocinonide gel (LIDEX) 2.97 % Apply 1 application topically daily.      Marland Kitchen FLUoxetine (PROZAC) 20 MG capsule Take 20 mg by mouth daily.      . furosemide (LASIX) 20 MG tablet TAKE ONE TABLET BY MOUTH ONCE DAILY  30 tablet  3  . lidocaine (XYLOCAINE) 5 % ointment       . ondansetron (ZOFRAN) 4 MG tablet Take 1 tablet (4 mg total) by mouth every 8 (eight) hours as needed for nausea or vomiting.  6 tablet  0  . tizanidine (ZANAFLEX) 2 MG capsule Take 1 capsule (2 mg total) by mouth 3 (three) times daily.  30 capsule  0  . topiramate (TOPAMAX) 50 MG tablet TAKE ONE TABLET BY MOUTH IN THE  MORNING AND TWO IN THE EVENING  90 tablet  0  . diltiazem (CARDIZEM CD) 120 MG 24 hr capsule Take 1 capsule (120 mg total) by mouth daily.  30 capsule  11   No current facility-administered medications on file prior to visit.    ALLERGIES: Allergies  Allergen Reactions  . Amoxicillin-Pot Clavulanate      REACTION: DIARRHEA  . Hydrocodone-Acetaminophen     REACTION: sensitive to  but can take for extreme pain per patient  . Propoxyphene Hcl     REACTION: nausea  . Propoxyphene N-Acetaminophen     REACTION: nausea  . Sulfa Antibiotics     Unknown   . Lisinopril     Restless , irregular sleep    FAMILY HISTORY: Family History  Problem Relation Age of Onset  . Crohn's disease Mother   . Atrial fibrillation Mother   . Bladder Cancer Father   . Bipolar disorder Sister   . Bipolar disorder Brother   . Heart attack Maternal Grandmother     >65  . Diabetes Neg Hx   . Stroke Neg Hx   . Prostate cancer Father     SOCIAL HISTORY: History   Social History  . Marital Status: Divorced    Spouse Name: N/A    Number of Children: 3  . Years of Education: N/A   Occupational History  .     Social History Main Topics  . Smoking status: Never Smoker   . Smokeless tobacco: Never Used  . Alcohol Use: No  . Drug Use: No  . Sexual Activity: Not on file   Other Topics Concern  . Not on file   Social History Narrative   Lives alone.            REVIEW OF SYSTEMS: Constitutional: No fevers, chills, or sweats, no generalized fatigue, change in appetite Eyes: No visual changes, double vision, eye pain Ear, nose and throat: No hearing loss, ear pain, nasal congestion, sore throat Cardiovascular: No chest pain, palpitations Respiratory:  No shortness of breath at rest or with exertion, wheezes GastrointestinaI: No nausea, vomiting, diarrhea, abdominal pain, fecal incontinence Genitourinary:  No dysuria, urinary retention or frequency Musculoskeletal:  No neck pain, back pain Integumentary: No rash, pruritus, skin lesions Neurological: as above Psychiatric: No depression, insomnia, anxiety Endocrine: No palpitations, fatigue, diaphoresis, mood swings, change in appetite, change in weight, increased thirst Hematologic/Lymphatic:  No anemia, purpura, petechiae. Allergic/Immunologic:  no itchy/runny eyes, nasal congestion, recent allergic reactions, rashes  PHYSICAL EXAM: Filed Vitals:   05/23/14 1414  BP: 98/66  Pulse: 66  Temp: 98.2 F (36.8 C)  Resp: 16   General: No acute distress Head:  Normocephalic/atraumatic Neck: supple, no paraspinal tenderness, full range of motion Heart:  Regular rate and rhythm Lungs:  Clear to auscultation bilaterally Back: No paraspinal tenderness Neurological Exam: alert and oriented to person, place, and time. Attention span and concentration intact, recent and remote memory intact, fund of knowledge intact.  Speech fluent and not dysarthric, language intact.  CN II-XII intact. Fundoscopic exam unremarkable without vessel changes, exudates, hemorrhages or papilledema.  Bulk and tone normal, muscle strength 5/5 throughout.  Sensation to light touch, temperature and vibration intact.  Deep tendon reflexes 2+ throughout, toes downgoing.  Finger to nose and heel to shin testing intact.  Gait normal, Romberg negative.  IMPRESSION: Migraine without aura, controlled  PLAN: 1.  Continue topamax 50mg  in the morning and 100mg  at bedtime 2.  If needed, other abortive medication  options include Lidocaine nasal drops. 3.  Get eyes checked. 4.  Follow up in 9 months.  Metta Clines, DO  CC:  Unice Cobble, MD

## 2014-05-30 DIAGNOSIS — Z01419 Encounter for gynecological examination (general) (routine) without abnormal findings: Secondary | ICD-10-CM | POA: Diagnosis not present

## 2014-05-30 DIAGNOSIS — Z9189 Other specified personal risk factors, not elsewhere classified: Secondary | ICD-10-CM | POA: Diagnosis not present

## 2014-05-30 DIAGNOSIS — N951 Menopausal and female climacteric states: Secondary | ICD-10-CM | POA: Diagnosis not present

## 2014-05-30 DIAGNOSIS — A6000 Herpesviral infection of urogenital system, unspecified: Secondary | ICD-10-CM | POA: Diagnosis not present

## 2014-06-01 ENCOUNTER — Ambulatory Visit: Payer: Medicare Other | Admitting: Nurse Practitioner

## 2014-06-18 ENCOUNTER — Other Ambulatory Visit: Payer: Self-pay | Admitting: Neurology

## 2014-06-26 ENCOUNTER — Other Ambulatory Visit: Payer: Self-pay

## 2014-06-26 MED ORDER — FUROSEMIDE 20 MG PO TABS: ORAL_TABLET | ORAL | Status: DC

## 2014-07-10 ENCOUNTER — Encounter: Payer: Self-pay | Admitting: Nurse Practitioner

## 2014-07-10 ENCOUNTER — Ambulatory Visit (INDEPENDENT_AMBULATORY_CARE_PROVIDER_SITE_OTHER): Payer: Medicare Other | Admitting: Nurse Practitioner

## 2014-07-10 VITALS — BP 130/82 | HR 62 | Ht 62.0 in | Wt 138.1 lb

## 2014-07-10 DIAGNOSIS — I10 Essential (primary) hypertension: Secondary | ICD-10-CM

## 2014-07-10 DIAGNOSIS — R079 Chest pain, unspecified: Secondary | ICD-10-CM

## 2014-07-10 NOTE — Progress Notes (Signed)
Ebony Garcia Date of Birth: 19-Jul-1951 Medical Record #[id number]  History of Present Illness: Ebony Garcia is seen back today for a follow up visit - 2 month check. Seen for Ebony Garcia. She is a 63 year old female with mild HF with preserved EF back in 2014. She has had palpitations, lots of issues with anxiety, HTN, diet controlled DM. Has short term memory loss - reportedly from her topamax.   Seen back in May - appeared to be euvolemic. Negative stress test last year - but noted to have resting changes, fair exercise tolerance and LVH on EKG.Stress myoview was to be considered if symptoms persisted. Cozaar stopped and she was placed on Cardizem.   Comes back today. Here alone. Multiple complaints - depressed, chest is heavy, tight, still with palpitations, blurred vision, emotional - crying every day. Thinks it is her Topamax but will not come off due to long history of headaches. Never switched her medicine - remains on losartan. Apparently had not been taking her losartan prior to the last visit and she decided to not start the CCB. Lots of stress, family issues, family health issues - caring for her ex-husband. Lonely. 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 Says she has "Jesus" and is not suicidal.   Current Outpatient Prescriptions  Medication Sig Dispense Refill  . ALPRAZolam (XANAX) 1 MG tablet Take 1 mg by mouth at bedtime as needed for sleep.      . benzoyl peroxide-erythromycin (BENZAMYCIN) gel Apply topically 2 (two) times daily as needed.      . Estradiol 0.52 MG/0.87 GM (0.06%) GEL Apply 1 application topically daily. 1 pump      . fluocinonide gel (LIDEX) 1.69 % Apply 1 application topically daily.      Marland Kitchen FLUoxetine (PROZAC) 20 MG capsule Take 20 mg by mouth daily.      . furosemide (LASIX) 20 MG tablet TAKE ONE TABLET BY MOUTH ONCE DAILY  30 tablet  5  . lidocaine (XYLOCAINE) 5 % ointment       . losartan (COZAAR) 100 MG tablet Take 50 mg by mouth daily.       Marland Kitchen  topiramate (TOPAMAX) 50 MG tablet TAKE ONE TABLET BY MOUTH IN THE MORNING AND THEN TAKE TWO TABLETS BY MOUTH IN THE EVENING  90 tablet  0   No current facility-administered medications for this visit.    Allergies  Allergen Reactions  . Amoxicillin-Pot Clavulanate     REACTION: DIARRHEA  . Hydrocodone-Acetaminophen     REACTION: sensitive to  but can take for extreme pain per patient  . Propoxyphene Hcl     REACTION: nausea  . Propoxyphene N-Acetaminophen     REACTION: nausea  . Sulfa Antibiotics     Unknown   . Lisinopril     Restless , irregular sleep    Past Medical History  Diagnosis Date  . Migraines   . Hypertension   . Diabetes mellitus without complication     Diet controlled  . CHF (congestive heart failure)   . Depression with anxiety   . Panic attacks     Past Surgical History  Procedure Laterality Date  . Total abdominal hysterectomy w/ bilateral salpingoophorectomy      for fibroids and migraines  . Tonsillectomy and adenoidectomy    . Nasal septum surgery    . Tubal ligation    . Dilation and curettage of uterus    . Papaloma Right     History  Smoking status  .  Never Smoker   Smokeless tobacco  . Never Used    History  Alcohol Use No    Family History  Problem Relation Age of Onset  . Crohn's disease Mother   . Atrial fibrillation Mother   . Bladder Cancer Father   . Bipolar disorder Sister   . Bipolar disorder Brother   . Heart attack Maternal Grandmother     >65  . Diabetes Neg Hx   . Stroke Neg Hx   . Prostate cancer Father     Review of Systems: The review of systems is per the HPI.  All other systems were reviewed and are negative.  Physical Exam: BP 130/82  Pulse 62  Ht 5\' 2"  (1.575 m)  Wt 138 lb 1.9 oz (62.651 kg)  BMI 25.26 kg/m2 Patient is alert in no acute distress. Quite loud in conversation. Rambling speech. Skin is warm and dry. Color is normal.  HEENT is unremarkable. Normocephalic/atraumatic. PERRL. Sclera are  nonicteric. Neck is supple. No masses. No JVD. Lungs are clear. Cardiac exam shows a regular rate and rhythm. Abdomen is soft. Extremities are without edema. Gait and ROM are intact. No gross neurologic deficits noted.  Wt Readings from Last 3 Encounters:  07/10/14 138 lb 1.9 oz (62.651 kg)  05/23/14 141 lb (63.957 kg)  04/17/14 143 lb (64.864 kg)    LABORATORY DATA/PROCEDURES: EKG with sinus rhythm. Nonspecific changes.  Lab Results  Component Value Date   WBC 8.9 04/29/2013   HGB 12.7 04/29/2013   HCT 38.0 04/29/2013   PLT 123* 04/29/2013   GLUCOSE 112* 01/11/2014   CHOL 179 04/18/2012   TRIG 200.0* 04/18/2012   HDL 46.90 04/18/2012   LDLDIRECT 98.1 01/21/2011   LDLCALC 92 04/18/2012   ALT 14 04/18/2012   AST 16 04/18/2012   NA 139 01/11/2014   K 3.6* 01/11/2014   CL 101 01/11/2014   CREATININE 0.90 01/11/2014   BUN 17 01/11/2014   CO2 23 01/11/2014   TSH 2.46 03/07/2013   INR 0.93 07/19/2010   HGBA1C 5.9 12/08/2012   MICROALBUR 0.7 04/18/2012    BNP (last 3 results) No results found for this basename: PROBNP,  in the last 8760 hours   Assessment / Plan: 1. Multitude of somatic complaints as noted above - will arrange for stress Myoview to further define. She had some EKG changes on last GXT and Myoview was felt to be best option if further testing needed. She has other CV risk factors as well.   Further disposition to follow but I feel like most of this is stress/anxiety driven.   Patient is agreeable to this plan and will call if any problems develop in the interim.   Ebony Junes, RN, [place] [address] [place], [place]  [phone]

## 2014-07-10 NOTE — Patient Instructions (Signed)
We will arrange for a stress Myoview  For now, stay on your current medicines  Call the [place] office at [phone] if you have any questions, problems or concerns.

## 2014-07-13 DIAGNOSIS — H43819 Vitreous degeneration, unspecified eye: Secondary | ICD-10-CM | POA: Diagnosis not present

## 2014-07-18 ENCOUNTER — Other Ambulatory Visit: Payer: Self-pay | Admitting: Neurology

## 2014-07-19 ENCOUNTER — Encounter (HOSPITAL_COMMUNITY): Payer: Medicare Other

## 2014-07-23 ENCOUNTER — Encounter: Payer: Self-pay | Admitting: Cardiology

## 2014-08-02 ENCOUNTER — Telehealth: Payer: Self-pay | Admitting: Neurology

## 2014-08-02 ENCOUNTER — Other Ambulatory Visit: Payer: Self-pay | Admitting: *Deleted

## 2014-08-02 MED ORDER — AMBULATORY NON FORMULARY MEDICATION: 4.0000 [drp] | Freq: Once | Status: DC

## 2014-08-02 NOTE — Telephone Encounter (Signed)
[phone] pt states migraine since tues and would like some nasal spray called into [facility] on [address]

## 2014-08-07 DIAGNOSIS — F330 Major depressive disorder, recurrent, mild: Secondary | ICD-10-CM | POA: Diagnosis not present

## 2014-08-15 ENCOUNTER — Encounter: Payer: Self-pay | Admitting: Gastroenterology

## 2014-08-17 ENCOUNTER — Other Ambulatory Visit: Payer: Self-pay | Admitting: Neurology

## 2014-08-23 ENCOUNTER — Other Ambulatory Visit: Payer: Self-pay | Admitting: Neurology

## 2014-09-13 ENCOUNTER — Encounter: Payer: Self-pay | Admitting: Neurology

## 2014-09-13 ENCOUNTER — Ambulatory Visit (INDEPENDENT_AMBULATORY_CARE_PROVIDER_SITE_OTHER): Payer: Medicare Other | Admitting: Neurology

## 2014-09-13 VITALS — BP 110/70 | HR 73 | Ht 62.0 in | Wt 146.6 lb

## 2014-09-13 DIAGNOSIS — G43019 Migraine without aura, intractable, without status migrainosus: Secondary | ICD-10-CM | POA: Diagnosis not present

## 2014-09-13 MED ORDER — LAMOTRIGINE 25 MG PO TABS: ORAL_TABLET | ORAL | Status: DC

## 2014-09-13 NOTE — Patient Instructions (Signed)
1.  Start lamictal 25mg  at bedtime for 14 days, then increase to 25mg  twice daily.  2.  Decrease topamax to 50mg  twice daily for 7 days, then 50mg  at bedtime for 7 days, then stop. 3.  Follow up in 6 weeks.

## 2014-09-13 NOTE — Progress Notes (Signed)
NEUROLOGY FOLLOW UP OFFICE NOTE  Ebony Garcia [id number]  HISTORY OF PRESENT ILLNESS: Ebony Garcia is a 63 year old right-handed woman with hypertension, heart failure, renal calculi, depression, hyperlipidemia and recently diagnosed CHF (diastolic dysfunction) who follows up for migraine headache.   UPDATE: She has had increased stress related to her mother's health.  This has caused increase headaches.  She has been having headaches near daily, however she only takes an Advil gel capsule twice a month.  She reports side effects of the topamax, causing increased depression and hair loss.  She tried the lidocaine nasal drops, which helped but caused hives on her face.  She is still eating healthy and walking.  She spoke to somebody with migraines who had the same side effects to topamax and was switched to Lamictal, which was helpful.  HISTORY: Onset:  Since late 13s Location: usually left sided, periorbital, and back of head.  More recently, holocephalic Quality:  Sharp, aching pressure.  Non-throbbing Initial intensity:  7/10; June no headache Associated symptoms:  Nausea, photophobia and phonophobia.  No visual disturbance, osmophobia or vomiting Aura:  no Initial duration:  3 days; June no headache Initial frequency:  20-22 headache days per month (or 5 headache free days per month); June no headache Triggers/exacerbating factors:  stress Relieving factors:  Cold mask, heating pack on neck and shoulders  Past abortive therapy:  Tylenol (ineffective), ice & heat (helps some), Imitrex 100mg , Zomig (effective), Vicodin, Frova, indomethacin, DHE (effective), magnesium (ineffective), feverfew (ineffective), Midrin (effective but made sleepy), naproxen (ineffective), toradol po (ineffective), Toradol 60mg  IM (effective), Excedrin migraine (ineffective), Lidocaine nasal drops (effective but caused hives)  Past preventative therapy:  propranolol (ineffective), Botox (tried once but  gave flu symptoms), occipital nerve blocks, trigger point injections, amitriptyline (side effects), Depakote (hair loss), Effexor (for depression but no headache improvement on it), topiramate 50mg  AM & 100mg  HS (effective but caused side effects)  Family history of headache:  Mother, grandmother, great-aunt, cousins.  08/16/08 MRI Brain w/wo performed for "explosive headaches": nonspecific punctate hyperintensities in the subcortical white matter. 08/16/08 MRA Head: Unremarkable.  There is mild attenuation in the vertebrobasilar junction and proximal basilar artery which is likely artifact.  PAST MEDICAL HISTORY: Past Medical History  Diagnosis Date  . Migraines   . Hypertension   . Diabetes mellitus without complication     Diet controlled  . CHF (congestive heart failure)   . Depression with anxiety   . Panic attacks     MEDICATIONS: Current Outpatient Prescriptions on File Prior to Visit  Medication Sig Dispense Refill  . ALPRAZolam (XANAX) 1 MG tablet Take 1 mg by mouth at bedtime as needed for sleep.      . AMBULATORY NON FORMULARY MEDICATION Place 4 drops into both nostrils once. Lidocaine 4% 50 ml bottle  4 qtt in each nostril  may repeat in 15 minutes then again in 2 hours in a 24 hour period  50 mL  1idocaine 4%  . benzoyl peroxide-erythromycin (BENZAMYCIN) gel Apply topically 2 (two) times daily as needed.      . Estradiol 0.52 MG/0.87 GM (0.06%) GEL Apply 1 application topically daily. 1 pump      . fluocinonide gel (LIDEX) 1.09 % Apply 1 application topically daily.      Marland Kitchen FLUoxetine (PROZAC) 20 MG capsule Take 20 mg by mouth daily.      . furosemide (LASIX) 20 MG tablet TAKE ONE TABLET BY MOUTH ONCE DAILY  30 tablet  5  . losartan (COZAAR) 100 MG tablet Take 50 mg by mouth daily.       Marland Kitchen topiramate (TOPAMAX) 50 MG tablet TAKE ONE TABLET BY MOUTH IN THE MORNING AND TWO IN THE EVENING  90 tablet  0   No current facility-administered medications on file prior to visit.     ALLERGIES: Allergies  Allergen Reactions  . Amoxicillin-Pot Clavulanate     REACTION: DIARRHEA  . Hydrocodone-Acetaminophen     REACTION: sensitive to  but can take for extreme pain per patient  . Propoxyphene Hcl     REACTION: nausea  . Propoxyphene N-Acetaminophen     REACTION: nausea  . Sulfa Antibiotics     Unknown   . Lisinopril     Restless , irregular sleep    FAMILY HISTORY: Family History  Problem Relation Age of Onset  . Crohn's disease Mother   . Atrial fibrillation Mother   . Heart failure Mother   . Bladder Cancer Father   . Prostate cancer Father   . Bipolar disorder Sister   . Bipolar disorder Brother   . Heart attack Maternal Grandmother     >65  . Diabetes Neg Hx   . Stroke Neg Hx     SOCIAL HISTORY: History   Social History  . Marital Status: Divorced    Spouse Name: N/A    Number of Children: 3  . Years of Education: N/A   Occupational History  .     Social History Main Topics  . Smoking status: Never Smoker   . Smokeless tobacco: Never Used  . Alcohol Use: No  . Drug Use: No  . Sexual Activity: Not Currently   Other Topics Concern  . Not on file   Social History Narrative   Lives alone.            REVIEW OF SYSTEMS: Constitutional: No fevers, chills, or sweats, no generalized fatigue, change in appetite Eyes: No visual changes, double vision, eye pain Ear, nose and throat: No hearing loss, ear pain, nasal congestion, sore throat Cardiovascular: No chest pain, palpitations Respiratory:  No shortness of breath at rest or with exertion, wheezes GastrointestinaI: No nausea, vomiting, diarrhea, abdominal pain, fecal incontinence Genitourinary:  No dysuria, urinary retention or frequency Musculoskeletal:  No neck pain, back pain Integumentary: Hair loss Neurological: as above Psychiatric: depression, insomnia, anxiety Endocrine:Mood swings, hair loss Hematologic/Lymphatic:  No anemia, purpura,  petechiae. Allergic/Immunologic: no itchy/runny eyes, nasal congestion, recent allergic reactions, rashes  PHYSICAL EXAM: Filed Vitals:   09/13/14 0901  BP: 110/70  Pulse: 73   General: No acute distress Head:  Normocephalic/atraumatic Neck: supple, no paraspinal tenderness, full range of motion Heart:  Regular rate and rhythm Lungs:  Clear to auscultation bilaterally Back: No paraspinal tenderness Neurological Exam: alert and oriented to person, place, and time. Attention span and concentration intact, recent and remote memory intact, fund of knowledge intact.  Speech fluent and not dysarthric, language intact.  CN II-XII intact. Fundoscopic exam unremarkable without vessel changes, exudates, hemorrhages or papilledema.  Bulk and tone normal, muscle strength 5/5 throughout.  Sensation to light touch intact.  Deep tendon reflexes 2+ throughout.  Finger to nose intact.  Gait normal.  IMPRESSION: Migraine without aura Depression  PLAN: 1.  Lamictal has been helpful for migraines in some people, although studies have not shown it to be as affective as other options.  However, I would be willing to try it, especially since it helps with mood, which is  a primarily cause of her migraines.  We will start 25mg  at bedtime for 2 weeks, then increasing to 25mg  twice daily. 2.  She will taper topamax by 50mg  per week until off. 3.  Provided tips on sleep hygiene 4.  Follow up in 6 weeks.  Metta Clines, DO  CC:  Unice Cobble, MD

## 2014-09-14 ENCOUNTER — Telehealth: Payer: Self-pay | Admitting: Neurology

## 2014-09-14 NOTE — Telephone Encounter (Signed)
Pt needs to talk to someone about medication please call [phone]

## 2014-09-15 ENCOUNTER — Encounter: Payer: Self-pay | Admitting: Neurology

## 2014-09-16 ENCOUNTER — Encounter: Payer: Self-pay | Admitting: Neurology

## 2014-09-19 ENCOUNTER — Encounter: Payer: Self-pay | Admitting: *Deleted

## 2014-09-19 ENCOUNTER — Other Ambulatory Visit: Payer: Self-pay | Admitting: *Deleted

## 2014-09-19 DIAGNOSIS — G43009 Migraine without aura, not intractable, without status migrainosus: Secondary | ICD-10-CM

## 2014-09-19 MED ORDER — ATENOLOL 50 MG PO TABS: 50.0000 mg | ORAL_TABLET | Freq: Every day | ORAL | Status: DC

## 2014-09-19 NOTE — Telephone Encounter (Signed)
1.  Since she cannot come in for a headache cocktail, we can prescribe her a prednisone taper.   2.  For a preventative medication, we can try atenolol 50mg  every morning.  It is a blood pressure medication that is effective for chronic migraines.  It may cause sleepiness or dizziness.    3.  I would decrease topamax to 50mg  twice daily for 7 days, then 50mg  at bedtime for 7 days, then stop.

## 2014-09-19 NOTE — Telephone Encounter (Signed)
Patient called stating she has had a headache since she called the office on 09/14/14 with no reply . She states she wakes up with it and goes to bed with the headache it has been 24 hours since last Friday 09/14/14. She is not able to do the lidocaine drops she feels she is allergic to it . She is taking Topomax 50mg  am  And 100mg  HS . She takes nothing else like naproxen  Due to her heart issue .Please advise what else she can do for pain

## 2014-10-08 DIAGNOSIS — N390 Urinary tract infection, site not specified: Secondary | ICD-10-CM | POA: Diagnosis not present

## 2014-10-08 DIAGNOSIS — R300 Dysuria: Secondary | ICD-10-CM | POA: Diagnosis not present

## 2014-10-11 ENCOUNTER — Other Ambulatory Visit: Payer: Self-pay | Admitting: Internal Medicine

## 2014-10-22 ENCOUNTER — Other Ambulatory Visit: Payer: Self-pay | Admitting: Neurology

## 2014-10-25 ENCOUNTER — Telehealth: Payer: Self-pay | Admitting: Neurology

## 2014-10-25 NOTE — Telephone Encounter (Signed)
Pt canceled her f/u appt for tomorrow 10/26/14. Pt has other things going on. Pt will call later to r/s.

## 2014-10-25 NOTE — Telephone Encounter (Signed)
FYI Susie. This is Dr Georgie Chard patient.

## 2014-10-26 ENCOUNTER — Ambulatory Visit: Payer: Medicare Other | Admitting: Neurology

## 2014-10-26 NOTE — Telephone Encounter (Signed)
See  Below not

## 2014-11-11 ENCOUNTER — Other Ambulatory Visit: Payer: Self-pay | Admitting: Internal Medicine

## 2014-11-12 ENCOUNTER — Other Ambulatory Visit: Payer: Self-pay

## 2014-11-12 MED ORDER — LOSARTAN POTASSIUM 50 MG PO TABS: 50.0000 mg | ORAL_TABLET | Freq: Every day | ORAL | Status: DC

## 2014-11-21 ENCOUNTER — Other Ambulatory Visit (INDEPENDENT_AMBULATORY_CARE_PROVIDER_SITE_OTHER): Payer: Medicare Other

## 2014-11-21 ENCOUNTER — Ambulatory Visit (INDEPENDENT_AMBULATORY_CARE_PROVIDER_SITE_OTHER)
Admission: RE | Admit: 2014-11-21 | Discharge: 2014-11-21 | Disposition: A | Discharge status: Home / Self Care | Payer: Medicare Other | Source: Ambulatory Visit | Attending: Internal Medicine | Admitting: Internal Medicine

## 2014-11-21 ENCOUNTER — Encounter: Payer: Self-pay | Admitting: Internal Medicine

## 2014-11-21 ENCOUNTER — Ambulatory Visit (INDEPENDENT_AMBULATORY_CARE_PROVIDER_SITE_OTHER): Payer: Medicare Other | Admitting: Internal Medicine

## 2014-11-21 VITALS — BP 102/80 | HR 54 | Temp 97.7°F | Wt 149.0 lb

## 2014-11-21 DIAGNOSIS — I517 Cardiomegaly: Secondary | ICD-10-CM | POA: Diagnosis not present

## 2014-11-21 DIAGNOSIS — R9431 Abnormal electrocardiogram [ECG] [EKG]: Secondary | ICD-10-CM

## 2014-11-21 DIAGNOSIS — R0789 Other chest pain: Secondary | ICD-10-CM

## 2014-11-21 DIAGNOSIS — R05 Cough: Secondary | ICD-10-CM | POA: Diagnosis not present

## 2014-11-21 DIAGNOSIS — I10 Essential (primary) hypertension: Secondary | ICD-10-CM | POA: Diagnosis not present

## 2014-11-21 LAB — BASIC METABOLIC PANEL
BUN: 14 mg/dL (ref 6–23)
CO2: 24 mEq/L (ref 19–32)
Calcium: 9.1 mg/dL (ref 8.4–10.5)
Chloride: 111 mEq/L (ref 96–112)
Creatinine, Ser: 1 mg/dL (ref 0.4–1.2)
GFR: 62.02 mL/min (ref >60.00)
Glucose, Bld: 70 mg/dL (ref 70–99)
Potassium: 3.9 mEq/L (ref 3.5–5.1)
Sodium: 143 mEq/L (ref 135–145)

## 2014-11-21 MED ORDER — LOSARTAN POTASSIUM 50 MG PO TABS: 50.0000 mg | ORAL_TABLET | Freq: Every day | ORAL | Status: DC

## 2014-11-21 NOTE — Progress Notes (Signed)
   Subjective:    Patient ID: Ebony Garcia, female    DOB: [birth year], 63 y.o.   MRN: [id number]  HPI  She is here for refill of her ARB antihypertensive. She's been taking the medicine without adverse effects.  BP averages 109/82 She's been on low-salt, low sugar, low-fat diet with associated 77 pound weight loss  She also had been walking 2-3 times per week for 30 minutes. She has not done this recently &has regained 10 pounds  She had no cardiopulmonary symptoms with exercise but does have occasional palpitations and chest pain at rest  She relates this to stress.  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  She also describes stresses of "dysfunctional family".  Chest pain is described as intermittent and aching in the substernal area and in either lower thoracic area. It's more so on the left than the right. There is no associated nausea or diaphoresis.  The discomfort can last up to 30 minutes  She's had some migraine symptoms as well  She denies any active GI symptoms  Her maternal grandmother also had congestive heart failure. There is no family history of heart attack   Review of Systems  Tachycardia, exertional dyspnea, paroxysmal nocturnal dyspnea, claudication or edema are absent.  Unexplained weight loss, abdominal pain, significant dyspepsia, dysphagia, melena, rectal bleeding, or persistently small caliber stools are denied.     Objective:   Physical Exam Appears healthy and well-nourished & in no acute distress  No carotid bruits are present.No neck vein distention present at 10 - 15 degrees. Thyroid normal to palpation  Heart rhythm and rate are normal with no gallop or murmur  Chest is clear with no increased work of breathing  There is no evidence of aortic aneurysm or renal artery bruits  Abdomen soft with no organomegaly or masses. No HJR  No clubbing, cyanosis or edema present.  Pedal pulses are intact    No ischemic skin changes are present . Fingernails healthy   Alert and oriented. Strength, tone, DTRs reflexes normal          Assessment & Plan:  #1 hypertension  #2 atypical chest pain  #3 EKG : some loss of T volatage diffusely  See orders and recommendations

## 2014-11-21 NOTE — Patient Instructions (Addendum)
Your next office appointment will be determined based upon review of your pending labs & x-rays. Those instructions will be transmitted to you through My Chart  Followup as needed for your acute issue. Please report any significant change in your symptoms. Minimal Blood Pressure Goal= AVERAGE < 140/90;  Ideal is an AVERAGE < 135/85. This AVERAGE should be calculated from @ least 5-7 BP readings taken @ different times of day on different days of week. You should not respond to isolated BP readings , but rather the AVERAGE for that week .Please bring your  blood pressure cuff to office visits to verify that it is reliable.It  can also be checked against the blood pressure device at the pharmacy. Finger or wrist cuffs are not dependable; an arm cuff is.  The stress test referral will be scheduled and you'll be notified of the time.Please call the Referral Co-Ordinator @ [phone] if you have not been notified of appointment time within 7-10 days.

## 2014-11-21 NOTE — Progress Notes (Signed)
Pre visit review using our clinic review tool, if applicable. No additional management support is needed unless otherwise documented below in the visit note. 

## 2014-11-22 ENCOUNTER — Other Ambulatory Visit: Payer: Self-pay | Admitting: Neurology

## 2014-11-23 ENCOUNTER — Encounter: Payer: Self-pay | Admitting: Internal Medicine

## 2014-12-10 DIAGNOSIS — F332 Major depressive disorder, recurrent severe without psychotic features: Secondary | ICD-10-CM | POA: Diagnosis not present

## 2014-12-17 ENCOUNTER — Encounter: Payer: Self-pay | Admitting: Gastroenterology

## 2014-12-19 ENCOUNTER — Encounter: Payer: Self-pay | Admitting: *Deleted

## 2014-12-23 ENCOUNTER — Other Ambulatory Visit: Payer: Self-pay | Admitting: Internal Medicine

## 2014-12-25 ENCOUNTER — Other Ambulatory Visit: Payer: Self-pay | Admitting: Neurology

## 2014-12-31 ENCOUNTER — Encounter: Payer: Medicare Other | Admitting: Physician Assistant

## 2015-01-08 ENCOUNTER — Encounter: Payer: Self-pay | Admitting: *Deleted

## 2015-01-14 ENCOUNTER — Other Ambulatory Visit: Payer: Self-pay | Admitting: Neurology

## 2015-01-24 ENCOUNTER — Other Ambulatory Visit: Payer: Self-pay | Admitting: Neurology

## 2015-01-30 DIAGNOSIS — B373 Candidiasis of vulva and vagina: Secondary | ICD-10-CM | POA: Diagnosis not present

## 2015-01-30 DIAGNOSIS — N390 Urinary tract infection, site not specified: Secondary | ICD-10-CM | POA: Diagnosis not present

## 2015-01-30 DIAGNOSIS — R350 Frequency of micturition: Secondary | ICD-10-CM | POA: Diagnosis not present

## 2015-02-05 ENCOUNTER — Ambulatory Visit (INDEPENDENT_AMBULATORY_CARE_PROVIDER_SITE_OTHER): Payer: Medicare Other | Admitting: Neurology

## 2015-02-05 ENCOUNTER — Encounter: Payer: Self-pay | Admitting: Neurology

## 2015-02-05 VITALS — BP 90/58 | HR 64 | Temp 98.0°F | Resp 20 | Ht 62.0 in | Wt 153.1 lb

## 2015-02-05 DIAGNOSIS — F329 Major depressive disorder, single episode, unspecified: Secondary | ICD-10-CM | POA: Diagnosis not present

## 2015-02-05 DIAGNOSIS — G43719 Chronic migraine without aura, intractable, without status migrainosus: Secondary | ICD-10-CM | POA: Diagnosis not present

## 2015-02-05 DIAGNOSIS — F32A Depression, unspecified: Secondary | ICD-10-CM

## 2015-02-05 MED ORDER — PROMETHAZINE HCL 25 MG PO TABS: 25.0000 mg | ORAL_TABLET | Freq: Four times a day (QID) | ORAL | Status: DC | PRN

## 2015-02-05 NOTE — Progress Notes (Signed)
NEUROLOGY FOLLOW UP OFFICE NOTE  Ebony Garcia [id number]  HISTORY OF PRESENT ILLNESS: Ebony Garcia is a 64 year old right-handed woman with hypertension, heart failure, renal calculi, depression, hyperlipidemia and recently diagnosed CHF (diastolic dysfunction) who follows up for migraine headache.   UPDATE: Depression has continued to be a significant issue.  Her Prozac was increased but she doesn't feel it is helping.  There has been no improvement in migraine.  For a couple of months, she had been experiencing atypical chest pain.  An EKG showed some abnormalities, so she is scheduled for a stress test. Intensity:  8/10 Duration:  3 days Frequency:  20 headache days per month. However, she had a migraine for 10 days which ended yesterday.   Current abortive therapy:  Vicodin for recent migraine.  Otherwise, nothing. Current preventative therapy:  atenolol 50mg .  She was supposed to taper off topiramate but didn't, so she continues to take 150mg  daily (it causes cognitive problems)  HISTORY: Onset:  Since late 30s Location: usually left sided, periorbital, and back of head.  More recently, holocephalic Quality:  Sharp, aching pressure.  Non-throbbing Initial intensity:  7/10 Associated symptoms:  Nausea, photophobia and phonophobia.  No visual disturbance, osmophobia or vomiting Aura:  no Initial duration:  3 days Initial frequency:  20-22 headache days per month (or 5 headache free days per month) Triggers/exacerbating factors:  stress Relieving factors:  Cold mask, heating pack on neck and shoulders  Past abortive therapy:  Tylenol (ineffective), ice & heat (helps some), Imitrex 100mg , Zomig (effective), Vicodin, Frova, indomethacin, DHE (effective), magnesium (ineffective), feverfew (ineffective), Midrin (effective but made sleepy), naproxen (ineffective), toradol po (ineffective), Toradol 60mg  IM (effective), Excedrin migraine (ineffective), Lidocaine nasal drops  (effective but caused hives), Tramadol (side effects)  Past preventative therapy:  propranolol (ineffective), Botox (tried once but gave flu symptoms), occipital nerve blocks, trigger point injections, amitriptyline (side effects), Depakote (hair loss), Effexor (for depression but no headache improvement on it), Lamictal (side effects)  Family history of headache:  Mother, grandmother, great-aunt, cousins.  08/16/08 MRI Brain w/wo performed for "explosive headaches": nonspecific punctate hyperintensities in the subcortical white matter. 08/16/08 MRA Head: Unremarkable.  There is mild attenuation in the vertebrobasilar junction and proximal basilar artery which is likely artifact.  PAST MEDICAL HISTORY: Past Medical History  Diagnosis Date  . Migraines   . Hypertension   . Diabetes mellitus without complication     Diet controlled  . CHF (congestive heart failure)   . Depression with anxiety   . Panic attacks     MEDICATIONS: Current Outpatient Prescriptions on File Prior to Visit  Medication Sig Dispense Refill  . ALPRAZolam (XANAX) 1 MG tablet Take 1 mg by mouth at bedtime as needed for sleep.    . benzoyl peroxide-erythromycin (BENZAMYCIN) gel Apply topically 2 (two) times daily as needed.    . Estradiol 0.52 MG/0.87 GM (0.06%) GEL Apply 1 application topically daily. 1 pump    . FLUoxetine (PROZAC) 20 MG capsule Take 20 mg by mouth daily.    . furosemide (LASIX) 20 MG tablet TAKE ONE TABLET BY MOUTH ONCE DAILY 30 tablet 5  . losartan (COZAAR) 50 MG tablet Take 1 tablet (50 mg total) by mouth daily. 90 tablet 1  . topiramate (TOPAMAX) 50 MG tablet TAKE ONE TABLET BY MOUTH IN THE MORNING AND TWO IN THE EVENING 90 tablet 0  . AMBULATORY NON FORMULARY MEDICATION Place 4 drops into both nostrils once. Lidocaine 4% 50 ml bottle  4 qtt in each nostril  may repeat in 15 minutes then again in 2 hours in a 24 hour period (Patient not taking: Reported on 02/05/2015) 50 mL 1idocaine 4%  .  fluocinonide gel (LIDEX) 5.28 % Apply 1 application topically daily.     No current facility-administered medications on file prior to visit.    ALLERGIES: Allergies  Allergen Reactions  . Amoxicillin-Pot Clavulanate     REACTION: DIARRHEA  . Hydrocodone-Acetaminophen     REACTION: sensitive to  but can take for extreme pain per patient  . Propoxyphene Hcl     REACTION: nausea  . Propoxyphene N-Acetaminophen     REACTION: nausea  . Sulfa Antibiotics     Unknown   . Lisinopril     Restless , irregular sleep    FAMILY HISTORY: Family History  Problem Relation Age of Onset  . Crohn's disease Mother   . Atrial fibrillation Mother   . Heart failure Mother   . Bladder Cancer Father   . Prostate cancer Father   . Bipolar disorder Sister   . Bipolar disorder Brother   . Heart attack Maternal Grandmother     >65  . Diabetes Neg Hx   . Stroke Neg Hx     SOCIAL HISTORY: History   Social History  . Marital Status: Divorced    Spouse Name: N/A  . Number of Children: 3  . Years of Education: N/A   Occupational History  .     Social History Main Topics  . Smoking status: Never Smoker   . Smokeless tobacco: Never Used  . Alcohol Use: No  . Drug Use: No  . Sexual Activity:    Partners: Male   Other Topics Concern  . Not on file   Social History Narrative   Lives alone.            REVIEW OF SYSTEMS: Constitutional: No fevers, chills, or sweats, no generalized fatigue, change in appetite Eyes: No visual changes, double vision, eye pain Ear, nose and throat: No hearing loss, ear pain, nasal congestion, sore throat Cardiovascular: No chest pain, palpitations Respiratory:  No shortness of breath at rest or with exertion, wheezes GastrointestinaI: No nausea, vomiting, diarrhea, abdominal pain, fecal incontinence Genitourinary:  No dysuria, urinary retention or frequency Musculoskeletal:  No neck pain, back pain Integumentary: No rash, pruritus, skin  lesions Neurological: as above Psychiatric: No depression, insomnia, anxiety Endocrine: No palpitations, fatigue, diaphoresis, mood swings, change in appetite, change in weight, increased thirst Hematologic/Lymphatic:  No anemia, purpura, petechiae. Allergic/Immunologic: no itchy/runny eyes, nasal congestion, recent allergic reactions, rashes  PHYSICAL EXAM: Filed Vitals:   02/05/15 1409  BP: 90/58  Pulse: 64  Temp: 98 F (36.7 C)  Resp: 20   General: No acute distress Head:  Normocephalic/atraumatic Eyes:  Fundoscopic exam unremarkable without vessel changes, exudates, hemorrhages or papilledema. Neck: supple, no paraspinal tenderness, full range of motion Heart:  Regular rate and rhythm Lungs:  Clear to auscultation bilaterally Back: No paraspinal tenderness Neurological Exam: alert and oriented to person, place, and time. Attention span and concentration intact, recent and remote memory intact, fund of knowledge intact.  Speech fluent and not dysarthric, language intact.  CN II-XII intact. Fundoscopic exam unremarkable without vessel changes, exudates, hemorrhages or papilledema.  Bulk and tone normal, muscle strength 5/5 throughout.  Sensation to light touch intact.  Deep tendon reflexes 2+ throughout.  Finger to nose testing intact.  Gait normal  IMPRESSION: Chronic migraine without aura, intractable Depression  PLAN: 1.  Will discontinue atenolol since it is ineffective and I do not want to increase dose as her blood pressure is already running low (also it may exacerbate depression). 2.  Will continue topiramate 150mg  daily for now. 3.  She is open to retry Botox.  She fits the criteria and I feel she would benefit from it.  We will send in for pre-authorization.  If effective, goal would be to taper off topiramate. 4.  Since Prozac does not appear to be helpful for her depression anyway, I would have Ms. Dorethea Clan consider switching to Zoloft, as this has been found to be  helpful for migraine as well. 5.  I really don't have any other suggestions for abortive therapy, as she has either failed them or she has a contraindication to them.  I will prescribe her promethazine 25mg  for nausea and migraine.  While narcotics are not recommended for headache pain due to propensity to easily cause rebound headache, in this case she may take it sparingly as there are really no other options.  I do not prescribe such medication, so it would have to be filled by her PCP 6.  Follow up for Botox  30 minutes spent with patient, over 50% spent discussing management.  Ebony Clines, DO  CC:  Unice Cobble, MD  Noemi Chapel, APMHNP-BC

## 2015-02-05 NOTE — Patient Instructions (Addendum)
1.  Stop the atenolol but continue topiramate 150mg  daily. 2.  Will prescribe Phenergan 25mg  to take as needed for nausea.  May take for headache as well. 3.  Will get pre-approval for Botox and contact you to set up a time. 4.  Discuss with Dr. Cam Hai regarding switching Prozac to Zoloft as some have found Zoloft to be helpful for migraine.  I will send her my note.

## 2015-02-07 ENCOUNTER — Encounter: Payer: Self-pay | Admitting: Cardiology

## 2015-02-07 ENCOUNTER — Ambulatory Visit (INDEPENDENT_AMBULATORY_CARE_PROVIDER_SITE_OTHER): Payer: Medicare Other | Admitting: Cardiology

## 2015-02-07 DIAGNOSIS — R0789 Other chest pain: Secondary | ICD-10-CM

## 2015-02-07 DIAGNOSIS — F419 Anxiety disorder, unspecified: Secondary | ICD-10-CM

## 2015-02-07 DIAGNOSIS — R079 Chest pain, unspecified: Secondary | ICD-10-CM

## 2015-02-07 DIAGNOSIS — I10 Essential (primary) hypertension: Secondary | ICD-10-CM

## 2015-02-07 HISTORY — DX: Chest pain, unspecified: R07.9

## 2015-02-07 HISTORY — DX: Anxiety disorder, unspecified: F41.9

## 2015-02-07 NOTE — Progress Notes (Signed)
02/07/2015 Ebony Garcia   1951/04/20  [id number]  Primary Physician Unice Cobble, MD Primary Cardiologist: Dr Percival Spanish  HPI:  64 y/o followed by Dr Linna Darner, seen by Dr Percival Spanish in the past. She has a history of HTN and chest pain, felt to be atypical previously. LOV was with Truitt Merle NP on 07/10/14. The pt had a negative stress test July 2014  - but noted to have resting changes, fair exercise tolerance and LVH on EKG.Stress myoview was to be considered if symptoms persisted. She never did have the Myoview. Recently she has noted increasing chest discomfort. Some sounds like angina, "tightness and a migraine when vacuuming". She is office today after a treadmill was ordered by her PCP.   Current Outpatient Prescriptions  Medication Sig Dispense Refill  . ALPRAZolam (XANAX) 1 MG tablet Take 1 mg by mouth at bedtime as needed for sleep.    . AMBULATORY NON FORMULARY MEDICATION Place 4 drops into both nostrils once. Lidocaine 4% 50 ml bottle  4 qtt in each nostril  may repeat in 15 minutes then again in 2 hours in a 24 hour period (Patient not taking: Reported on 02/05/2015) 50 mL 1idocaine 4%  . benzoyl peroxide-erythromycin (BENZAMYCIN) gel Apply topically 2 (two) times daily as needed.    . Estradiol 0.52 MG/0.87 GM (0.06%) GEL Apply 1 application topically daily. 1 pump    . fluocinonide gel (LIDEX) 7.61 % Apply 1 application topically daily.    Marland Kitchen FLUoxetine (PROZAC) 20 MG capsule Take 20 mg by mouth daily.    . furosemide (LASIX) 20 MG tablet TAKE ONE TABLET BY MOUTH ONCE DAILY 30 tablet 5  . losartan (COZAAR) 50 MG tablet Take 1 tablet (50 mg total) by mouth daily. 90 tablet 1  . promethazine (PHENERGAN) 25 MG tablet Take 1 tablet (25 mg total) by mouth every 6 (six) hours as needed for nausea or vomiting. 30 tablet 0  . topiramate (TOPAMAX) 50 MG tablet TAKE ONE TABLET BY MOUTH IN THE MORNING AND TWO IN THE EVENING 90 tablet 0   No current facility-administered medications for  this visit.    Allergies  Allergen Reactions  . Amoxicillin-Pot Clavulanate     REACTION: DIARRHEA  . Hydrocodone-Acetaminophen     REACTION: sensitive to  but can take for extreme pain per patient  . Propoxyphene Hcl     REACTION: nausea  . Propoxyphene N-Acetaminophen     REACTION: nausea  . Sulfa Antibiotics     Unknown   . Lisinopril     Restless , irregular sleep    History   Social History  . Marital Status: Divorced    Spouse Name: N/A  . Number of Children: 3  . Years of Education: N/A   Occupational History  .     Social History Main Topics  . Smoking status: Never Smoker   . Smokeless tobacco: Never Used  . Alcohol Use: No  . Drug Use: No  . Sexual Activity:    Partners: Male   Other Topics Concern  . Not on file   Social History Narrative   Lives alone.             Review of Systems: General: negative for chills, fever, night sweats or weight changes.  Cardiovascular: negative for chest pain, dyspnea on exertion, edema, orthopnea, palpitations, paroxysmal nocturnal dyspnea or shortness of breath Dermatological: negative for rash Respiratory: negative for cough or wheezing Urologic: negative for hematuria Abdominal: negative for nausea,  vomiting, diarrhea, bright red blood per rectum, melena, or hematemesis Neurologic: negative for visual changes, syncope, or dizziness All other systems reviewed and are otherwise negative except as noted above.    There were no vitals taken for this visit.  General appearance: alert, cooperative and no distress  ASSESSMENT AND PLAN:   Chest pain with moderate risk of acute coronary syndrome Pt referred for a treadmill by PCP.   Essential hypertension Controlled    PLAN  I don't think a routine treadmill is the correct study in this situation and after speaking with the pt I have rescheduled her for an exercise Myoview. She tells me she had "the injection study (? Persantine) before and would not do  that again. She feels she is out of shape but she thinks she can do the treadmill study.   Ebony Garcia KPA-C 02/07/2015 2:28 PM

## 2015-02-07 NOTE — Assessment & Plan Note (Signed)
Controlled.  

## 2015-02-07 NOTE — Assessment & Plan Note (Signed)
Pt referred for a treadmill by PCP.

## 2015-02-13 ENCOUNTER — Telehealth: Payer: Self-pay | Admitting: *Deleted

## 2015-02-13 NOTE — Telephone Encounter (Signed)
Patient is aware that she has been approved for BOTOX she wishes to wait 1 month to make sure she really needs it

## 2015-02-14 ENCOUNTER — Telehealth (HOSPITAL_COMMUNITY): Payer: Self-pay

## 2015-02-14 NOTE — Telephone Encounter (Signed)
Encounter complete. 

## 2015-02-19 ENCOUNTER — Ambulatory Visit (HOSPITAL_COMMUNITY)
Admission: RE | Admit: 2015-02-19 | Discharge: 2015-02-19 | Disposition: A | Discharge status: Home / Self Care | Payer: Medicare Other | Source: Ambulatory Visit | Attending: Cardiology | Admitting: Cardiology

## 2015-02-19 DIAGNOSIS — R5383 Other fatigue: Secondary | ICD-10-CM | POA: Insufficient documentation

## 2015-02-19 DIAGNOSIS — I10 Essential (primary) hypertension: Secondary | ICD-10-CM | POA: Diagnosis not present

## 2015-02-19 DIAGNOSIS — Z8249 Family history of ischemic heart disease and other diseases of the circulatory system: Secondary | ICD-10-CM | POA: Insufficient documentation

## 2015-02-19 DIAGNOSIS — R079 Chest pain, unspecified: Secondary | ICD-10-CM | POA: Insufficient documentation

## 2015-02-19 DIAGNOSIS — E785 Hyperlipidemia, unspecified: Secondary | ICD-10-CM | POA: Insufficient documentation

## 2015-02-19 DIAGNOSIS — R42 Dizziness and giddiness: Secondary | ICD-10-CM | POA: Diagnosis not present

## 2015-02-19 DIAGNOSIS — R002 Palpitations: Secondary | ICD-10-CM | POA: Diagnosis not present

## 2015-02-19 MED ORDER — TECHNETIUM TC 99M SESTAMIBI GENERIC - CARDIOLITE
30.0000 | Freq: Once | INTRAVENOUS | Status: AC | PRN
  Administered 2015-02-19: 30 via INTRAVENOUS

## 2015-02-19 MED ORDER — TECHNETIUM TC 99M SESTAMIBI GENERIC - CARDIOLITE
10.0000 | Freq: Once | INTRAVENOUS | Status: AC | PRN
  Administered 2015-02-19: 10 via INTRAVENOUS

## 2015-02-19 NOTE — Procedures (Addendum)
[facility] [address] [place] [address] [phone]  Cardiology Nuclear Med Study  Ebony Garcia is a 64 y.o. female     MRN : [id number]     DOB: Jun 20, 1951  Procedure Date: 02/19/2015  Nuclear Med Background Indication for Stress Test:  Evaluation for Ischemia History:  CHF;Diastolic Heart Dysfunction;No priro respiratory history reported;Last NUC MPI 10/26/2002 Cardiac Risk Factors: Family History - CAD, Hypertension and Lipids  Symptoms:  Chest Pain, Fatigue, Light-Headedness and Palpitations   Nuclear Pre-Procedure Caffeine/Decaff Intake:  9:30pm NPO After: 5:30am   IV Site: R Forearm  IV 0.9% NS with Angio Cath:  22g  Chest Size (in):  n/a IV Started by: Rolene Course, RN  Height: 5\' 2"  (1.575 m)  Cup Size: B  BMI:  Body mass index is 26.33 kg/(m^2). Weight:  144 lb (65.318 kg)   Tech Comments:  n/a    Nuclear Med Study 1 or 2 day study: 1 day  Stress Test Type:  Stress  Order Authorizing Provider:  Minus Breeding, MD   Resting Radionuclide: Technetium 49m Sestamibi  Resting Radionuclide Dose: 10.9 mCi   Stress Radionuclide:  Technetium 8m Sestamibi  Stress Radionuclide Dose: 29.0 mCi           Stress Protocol Rest HR: 57 Stress HR: 144  Rest BP: 117/89 Stress BP: 153/85  Exercise Time (min): 7:30 METS: 9.30   Predicted Max HR: 159 bpm % Max HR: 90.57 bpm Rate Pressure Product: 22032  Dose of Adenosine (mg):  n/a Dose of Lexiscan: n/a mg  Dose of Atropine (mg): n/a Dose of Dobutamine: n/a mcg/kg/min (at max HR)  Stress Test Technologist: Leane Para, CCT Nuclear Technologist:Elizabeth Young,CNMT   Rest Procedure:  Myocardial perfusion imaging was performed at rest 45 minutes following the intravenous administration of Technetium 59m Sestamibi. Stress Procedure:  The patient performed treadmill exercise using a Bruce  Protocol for 7:30 minutes. The patient stopped due to SOB and Fatigue  and denied any chest pain.  There were significant ST-T wave changes.  Technetium 45m Sestamibi was injected IV at peak exercise and myocardial perfusion imaging was performed after a brief delay.  Transient Ischemic Dilatation (Normal <1.22):  1.12  QGS EDV:  87 ml QGS ESV:  37 ml LV Ejection Fraction: 58%    Rest ECG: Sinus bradycardia.  Stress ECG: Significant ST abnormalities consistent with ischemia.  QPS Raw Data Images:  Acquisition technically good; normal left ventricular size. Stress Images:  There is decreased uptake in the anterior wall. Rest Images:  There is decreased uptake in the anterior wall. Subtraction (SDS):  No evidence of ischemia.  Impression Exercise Capacity:  Fair exercise capacity. BP Response:  Normal blood pressure response. Clinical Symptoms:  There is dyspnea. ECG Impression:  Significant ST abnormalities consistent with ischemia. Comparison with Prior Nuclear Study: No images to compare  Overall Impression:  Normal stress nuclear study with a small, mild, fixed anterior defect consistent with soft tissue attenuation; no ischemia.  LV Wall Motion:  NL LV Function; NL Wall Motion   Ebony Ruths, MD  02/19/2015 5:24 PM    .

## 2015-02-20 ENCOUNTER — Telehealth: Payer: Self-pay

## 2015-02-20 ENCOUNTER — Encounter: Payer: Self-pay | Admitting: Nurse Practitioner

## 2015-02-20 NOTE — Telephone Encounter (Signed)
No answer, no way to leave message about flu shot

## 2015-02-21 ENCOUNTER — Other Ambulatory Visit: Payer: Self-pay | Admitting: Neurology

## 2015-02-22 ENCOUNTER — Ambulatory Visit: Payer: Medicare Other | Admitting: Neurology

## 2015-03-13 DIAGNOSIS — L719 Rosacea, unspecified: Secondary | ICD-10-CM | POA: Diagnosis not present

## 2015-03-26 ENCOUNTER — Other Ambulatory Visit: Payer: Self-pay | Admitting: Neurology

## 2015-04-08 DIAGNOSIS — L719 Rosacea, unspecified: Secondary | ICD-10-CM | POA: Diagnosis not present

## 2015-04-17 DIAGNOSIS — F330 Major depressive disorder, recurrent, mild: Secondary | ICD-10-CM | POA: Diagnosis not present

## 2015-04-25 ENCOUNTER — Other Ambulatory Visit: Payer: Self-pay | Admitting: Neurology

## 2015-04-26 DIAGNOSIS — D485 Neoplasm of uncertain behavior of skin: Secondary | ICD-10-CM | POA: Diagnosis not present

## 2015-04-29 DIAGNOSIS — C44729 Squamous cell carcinoma of skin of left lower limb, including hip: Secondary | ICD-10-CM | POA: Diagnosis not present

## 2015-05-23 ENCOUNTER — Other Ambulatory Visit: Payer: Self-pay | Admitting: Neurology

## 2015-05-30 DIAGNOSIS — L905 Scar conditions and fibrosis of skin: Secondary | ICD-10-CM | POA: Diagnosis not present

## 2015-05-30 DIAGNOSIS — C44729 Squamous cell carcinoma of skin of left lower limb, including hip: Secondary | ICD-10-CM | POA: Diagnosis not present

## 2015-06-21 ENCOUNTER — Other Ambulatory Visit: Payer: Self-pay | Admitting: Neurology

## 2015-06-21 NOTE — Telephone Encounter (Signed)
Rx sent 

## 2015-06-24 ENCOUNTER — Other Ambulatory Visit: Payer: Self-pay | Admitting: Internal Medicine

## 2015-07-22 ENCOUNTER — Other Ambulatory Visit: Payer: Self-pay | Admitting: Neurology

## 2015-07-25 ENCOUNTER — Encounter: Payer: Self-pay | Admitting: Gastroenterology

## 2015-08-09 ENCOUNTER — Telehealth: Payer: Self-pay | Admitting: *Deleted

## 2015-08-09 ENCOUNTER — Telehealth: Payer: Self-pay | Admitting: Gastroenterology

## 2015-08-09 ENCOUNTER — Encounter: Payer: Self-pay | Admitting: Physician Assistant

## 2015-08-09 NOTE — Telephone Encounter (Signed)
Discussed with pt that she was due for a colon, 5 year recall due to adenomatous polyp. Pt states she will call back next week to schedule colon.

## 2015-08-09 NOTE — Telephone Encounter (Signed)
See phone note 08/09/15

## 2015-08-09 NOTE — Telephone Encounter (Signed)
Called patient at the request of Melina Copa, [place], after patient emailed her with question regarding taking ASA for intermittent chest pain. Called patient - She stated that she has been having intermittent "on again-off again" chest pain for "weeks" with intensity that varies from mild to achy to sharp. Usually subsides on its own after an hour or so. Not dependent on exertion. States pain is not radiating but she was able to identify s/s of acute CP to look for such as radiating pain to arm, back, jaw, dizziness, SOB. Stated that last night she took ASA that relieved pain. During our conversation she indicated that she was very depressed and she was receiving treatment for it (medications, psychiatrist, PCP, social support), however she was very stressed due to financial concerns, family disagreements and inability to find joy. She denied suicidal ideation and she contracted not to harm herself. She stated she had the resource numbers for the depression/suicide hotline. She stated she would never hurt herself because she was a [place] and did not believe in taking your own life. She requested to have this message sent to Truitt Merle, NP, to see if she could get a work in appointment with her soon to discuss the chest pains. Message forwarded to Truitt Merle. I asked patient if she would be willing to see a different provider and she stated she would rather see Cecille Rubin. Patient verbalized understanding to call 911 for any further CP, s/s worsening or new sx or any suicidal ideation/thoughts or thoughts of hurting herself in any way. She verbalized agreement and stated she would await to hear from Cecille Rubin sometime next week since today is Friday.

## 2015-08-10 NOTE — Telephone Encounter (Signed)
Can we try to find a spot for her ?[facility].

## 2015-08-14 ENCOUNTER — Telehealth: Payer: Self-pay | Admitting: *Deleted

## 2015-08-14 NOTE — Telephone Encounter (Signed)
She has an appointment.

## 2015-08-14 NOTE — Telephone Encounter (Signed)
S/w pt is aware of appointment with Truitt Merle, Np on Flex Schedule on Monday. Pt is aware if chest pain becomes persistent needs to go to ER

## 2015-08-14 NOTE — Telephone Encounter (Signed)
I had offered her a spot with [facility], however she just wanted to see you. Can Danielle open up a slot with you for this patient?  Thanks!

## 2015-08-19 ENCOUNTER — Encounter: Payer: Self-pay | Admitting: Nurse Practitioner

## 2015-08-19 ENCOUNTER — Ambulatory Visit (INDEPENDENT_AMBULATORY_CARE_PROVIDER_SITE_OTHER): Payer: Medicare Other | Admitting: Nurse Practitioner

## 2015-08-19 VITALS — BP 120/88 | HR 59 | Ht 62.0 in | Wt 177.6 lb

## 2015-08-19 DIAGNOSIS — R002 Palpitations: Secondary | ICD-10-CM

## 2015-08-19 DIAGNOSIS — I10 Essential (primary) hypertension: Secondary | ICD-10-CM

## 2015-08-19 DIAGNOSIS — R001 Bradycardia, unspecified: Secondary | ICD-10-CM

## 2015-08-19 DIAGNOSIS — F419 Anxiety disorder, unspecified: Secondary | ICD-10-CM | POA: Diagnosis not present

## 2015-08-19 DIAGNOSIS — R079 Chest pain, unspecified: Secondary | ICD-10-CM

## 2015-08-19 LAB — LIPID PANEL
Cholesterol: 196 mg/dL (ref 0–200)
HDL: 51 mg/dL (ref >39.00)
LDL Cholesterol: 107 mg/dL — ABNORMAL HIGH (ref 0–99)
NonHDL: 145.05
Total CHOL/HDL Ratio: 4
Triglycerides: 191 mg/dL — ABNORMAL HIGH (ref 0.0–149.0)
VLDL: 38.2 mg/dL (ref 0.0–40.0)

## 2015-08-19 LAB — HEPATIC FUNCTION PANEL
ALT: 13 U/L (ref 0–35)
AST: 16 U/L (ref 0–37)
Albumin: 4 g/dL (ref 3.5–5.2)
Alkaline Phosphatase: 72 U/L (ref 39–117)
Bilirubin, Direct: 0.1 mg/dL (ref 0.0–0.3)
Total Bilirubin: 0.2 mg/dL (ref 0.2–1.2)
Total Protein: 7.1 g/dL (ref 6.0–8.3)

## 2015-08-19 LAB — CBC
HCT: 40.2 % (ref 36.0–46.0)
Hemoglobin: 13.3 g/dL (ref 12.0–15.0)
MCHC: 33.1 g/dL (ref 30.0–36.0)
MCV: 92.8 fl (ref 78.0–100.0)
Platelets: 142 10*3/uL — ABNORMAL LOW (ref 150.0–400.0)
RBC: 4.33 Mil/uL (ref 3.87–5.11)
RDW: 13.2 % (ref 11.5–15.5)
WBC: 6.8 10*3/uL (ref 4.0–10.5)

## 2015-08-19 LAB — BASIC METABOLIC PANEL
BUN: 15 mg/dL (ref 6–23)
CO2: 24 mEq/L (ref 19–32)
Calcium: 8.9 mg/dL (ref 8.4–10.5)
Chloride: 109 mEq/L (ref 96–112)
Creatinine, Ser: 0.91 mg/dL (ref 0.40–1.20)
GFR: 66.6 mL/min (ref >60.00)
Glucose, Bld: 86 mg/dL (ref 70–99)
Potassium: 4.3 mEq/L (ref 3.5–5.1)
Sodium: 140 mEq/L (ref 135–145)

## 2015-08-19 LAB — TSH: TSH: 1.81 u[IU]/mL (ref 0.35–4.50)

## 2015-08-19 LAB — TROPONIN I: TNIDX: 0.03 ug/l (ref 0.00–0.06)

## 2015-08-19 MED ORDER — ASPIRIN EC 81 MG PO TBEC: 81.0000 mg | DELAYED_RELEASE_TABLET | Freq: Every day | ORAL | Status: DC

## 2015-08-19 MED ORDER — NITROGLYCERIN 0.4 MG SL SUBL: 0.4000 mg | SUBLINGUAL_TABLET | SUBLINGUAL | Status: DC | PRN

## 2015-08-19 NOTE — Patient Instructions (Addendum)
We will be checking the following labs today - BMEt, CBC, HPF, Lipids, TSH, troponin   Medication Instructions:    Continue with your current medicines.   I have sent in a RX for NTG Use your NTG under your tongue for recurrent chest pain. May take one tablet every 5 minutes. If you are still having discomfort after 3 tablets in 15 minutes, call 911.   Testing/Procedures To Be Arranged:  N/A  Follow-Up:   See me as needed.     Other Special Instructions:   N/A  Call the [place] office at [phone] if you have any questions, problems or concerns.

## 2015-08-19 NOTE — Progress Notes (Signed)
CARDIOLOGY OFFICE NOTE  Date:  08/19/2015    Ebony Garcia Date of Birth: [birth year] Medical Record #[id number]  PCP:  Unice Cobble, MD  Cardiologist:  [facility]    Chief Complaint  Patient presents with  . Chest Pain    Work in visit for multitude of somatic complaints - seen for Dr. Percival Spanish    History of Present Illness: Ebony Garcia is a 64 y.o. female who presents today for a work in visit. Seen for Dr. Percival Spanish. She is a 64 year old female with mild HF with preserved EF back in 2014. She has had palpitations, lots of issues with anxiety, HTN, diet controlled DM. Has short term memory loss - reportedly from her topamax.   Seen back in May of 2015 - appeared to be euvolemic. Negative stress test last year - but noted to have resting changes, fair exercise tolerance and LVH on EKG. Stress myoview was to be considered if symptoms persisted. Cozaar stopped and she was placed on Cardizem.  I last saw her in August of 2015 - multiple complaints - ended up getting her Myoview updated - she did not complete until March of 2016.    Comes back today. Here alone. Admits she is VERY depressed. She has gained weight - she is stress eating. She notes she is just lonely - feels her love life is over. Mother's health not good. Not a good relationship with her kids/grandkids. Not in active counseling. It just goes on and on. She says she has a "sour puss life". She has had chest pain - varies - comes and goes - sometimes sharp and sometimes achy - sometimes lasts for a few seconds/minutes, other times maybe an hour. She has stopped exercising. Very sedentary. Not in active counseling. Some palpitations noted as well. She says she is "ready to go" but denies being suicidal - says that is against her Darrick Me[place] belief.   Past Medical History  Diagnosis Date  . Migraines   . Hypertension   . Diabetes mellitus without complication     Diet controlled  . CHF (congestive heart  failure)   . Depression with anxiety   . Panic attacks     Past Surgical History  Procedure Laterality Date  . Total abdominal hysterectomy w/ bilateral salpingoophorectomy      for fibroids and migraines  . Tonsillectomy and adenoidectomy    . Nasal septum surgery    . Tubal ligation    . Dilation and curettage of uterus    . Papaloma Right      Medications: Current Outpatient Prescriptions  Medication Sig Dispense Refill  . ALPRAZolam (XANAX) 1 MG tablet Take 1 mg by mouth at bedtime as needed for sleep.    . AMBULATORY NON FORMULARY MEDICATION Place 4 drops into both nostrils once. Lidocaine 4% 50 ml bottle  4 qtt in each nostril  may repeat in 15 minutes then again in 2 hours in a 24 hour period 50 mL 1idocaine 4%  . benzoyl peroxide-erythromycin (BENZAMYCIN) gel Apply topically 2 (two) times daily as needed.    . Estradiol 0.52 MG/0.87 GM (0.06%) GEL Apply 1 application topically daily. 1 pump    . fluocinonide gel (LIDEX) 0.81 % Apply 1 application topically daily.    Marland Kitchen FLUoxetine (PROZAC) 20 MG capsule Take 20 mg by mouth daily.    . furosemide (LASIX) 20 MG tablet TAKE ONE TABLET BY MOUTH ONCE DAILY 30 tablet 1  . losartan (COZAAR)  50 MG tablet Take 1 tablet (50 mg total) by mouth daily. 90 tablet 1  . promethazine (PHENERGAN) 25 MG tablet Take 1 tablet (25 mg total) by mouth every 6 (six) hours as needed for nausea or vomiting. 30 tablet 0  . topiramate (TOPAMAX) 50 MG tablet TAKE ONE TABLET BY MOUTH IN THE MORNING AND TWO TABS BY MOUTH IN THE EVENING 90 tablet 0  . aspirin EC 81 MG tablet Take 1 tablet (81 mg total) by mouth daily. 90 tablet 3  . nitroGLYCERIN (NITROSTAT) 0.4 MG SL tablet Place 1 tablet (0.4 mg total) under the tongue every 5 (five) minutes as needed for chest pain. 25 tablet 3   No current facility-administered medications for this visit.    Allergies: Allergies  Allergen Reactions  . Amoxicillin-Pot Clavulanate Diarrhea  . Antihistamines,  Diphenhydramine-Type Other (See Comments)    Other Reaction: makes pt nervous  . Amoxicillin-Pot Clavulanate     REACTION: DIARRHEA  . Hydrocodone-Acetaminophen     REACTION: sensitive to  but can take for extreme pain per patient  . Propoxyphene Hcl     REACTION: nausea  . Propoxyphene N-Acetaminophen     REACTION: nausea  . Sulfa Antibiotics     Unknown   . Tramadol Other (See Comments)    Pt does know what kind of effect  . Lisinopril     Restless , irregular sleep    Social History: The patient  reports that she has never smoked. She has never used smokeless tobacco. She reports that she does not drink alcohol or use illicit drugs.   Family History: The patient's family history includes Atrial fibrillation in her mother; Bipolar disorder in her brother and sister; Bladder Cancer in her father; Crohn's disease in her mother; Heart attack in her maternal grandmother; Heart failure in her mother; Prostate cancer in her father. There is no history of Diabetes or Stroke.   Review of Systems: Please see the history of present illness.   Otherwise, the review of systems is positive for chest pain, visual disturbance, cough, depression, back pain, easy bruising, skipped irregular heart beat, anxiety and headaches.   All other systems are reviewed and negative.   Physical Exam: VS:  BP 120/88 mmHg  Pulse 59  Ht 5\' 2"  (1.575 m)  Wt 177 lb 9.6 oz (80.559 kg)  BMI 32.48 kg/m2 .  BMI Body mass index is 32.48 kg/(m^2).  Wt Readings from Last 3 Encounters:  08/19/15 177 lb 9.6 oz (80.559 kg)  02/19/15 144 lb (65.318 kg)  02/05/15 153 lb 1.6 oz (69.446 kg)    General: Pleasant. She has gained weight but in no acute distress.  HEENT: Normal. Neck: Supple, no JVD, carotid bruits, or masses noted.  Cardiac: Regular rate and rhythm. No murmurs, rubs, or gallops. No edema.  Respiratory:  Lungs are clear to auscultation bilaterally with normal work of breathing.  GI: Soft and nontender.    MS: No deformity or atrophy. Gait and ROM intact. Skin: Warm and dry. Color is normal.  Neuro:  Strength and sensation are intact and no gross focal deficits noted.  Psych: Alert, appropriate and with normal affect.   LABORATORY DATA:  EKG:  EKG is ordered today. This demonstrates NSR - rate is 59. She has nonspecific T wave changes.  Lab Results  Component Value Date   WBC 8.9 04/29/2013   HGB 12.7 04/29/2013   HCT 38.0 04/29/2013   PLT 123* 04/29/2013   GLUCOSE 70 11/21/2014  CHOL 179 04/18/2012   TRIG 200.0* 04/18/2012   HDL 46.90 04/18/2012   LDLDIRECT 98.1 01/21/2011   LDLCALC 92 04/18/2012   ALT 14 04/18/2012   AST 16 04/18/2012   NA 143 11/21/2014   K 3.9 11/21/2014   CL 111 11/21/2014   CREATININE 1.0 11/21/2014   BUN 14 11/21/2014   CO2 24 11/21/2014   TSH 2.46 03/07/2013   INR 0.93 07/19/2010   HGBA1C 5.9 12/08/2012   MICROALBUR 0.7 04/18/2012    BNP (last 3 results) No results for input(s): BNP in the last 8760 hours.  ProBNP (last 3 results) No results for input(s): PROBNP in the last 8760 hours.   Other Studies Reviewed Today:  Myoview 2015-03-03 Cardiology Impression Exercise Capacity: Fair exercise capacity. BP Response: Normal blood pressure response. Clinical Symptoms: There is dyspnea. ECG Impression: Significant ST abnormalities consistent with ischemia. Comparison with Prior Nuclear Study: No images to compare  Overall Impression: Normal stress nuclear study with a small, mild, fixed anterior defect consistent with soft tissue attenuation; no ischemia.  LV Wall Motion: NL LV Function; NL Wall Motion Kirk Ruths, MD  02/19/2015 5:24 PM   Echo Study Conclusions from 04/2013  Left ventricle: The cavity size was normal. Wall thickness was normal. Systolic function was normal. The estimated ejection fraction was in the range of 60% to 65%.  Assessment/Plan: 1. Chest pain - atypical - has had nonspecific changes on EKG - negative  Myoview 6 months ago. Could consider Cardiac CT - she wants to hold off - will send in NTG and she is instructed in how to take. Update her labs today. I do not think that proceeding on with cardiac cath at this time is warranted unless we have more objective findings.  2. Depression - encouraged to be active with her counselor and psychiatrist - I do not get the feeling that she will follow thru. She denies being suicidal.   Current medicines are reviewed with the patient today.  The patient does not have concerns regarding medicines other than what has been noted above.  The following changes have been made:  See above.  Labs/ tests ordered today include:    Orders Placed This Encounter  Procedures  . Basic metabolic panel  . CBC  . Hepatic function panel  . Lipid panel  . TSH  . Troponin I  . EKG 12-Lead     Disposition:   FU with me prn.   Patient is agreeable to this plan and will call if any problems develop in the interim.   Signed: Burtis Junes, RN, ANP-C 08/19/2015 12:35 PM  [facility] [address] [place], [place]  [address] Phone: [phone] Fax: [phone]

## 2015-08-23 ENCOUNTER — Other Ambulatory Visit: Payer: Self-pay | Admitting: Neurology

## 2015-08-23 ENCOUNTER — Telehealth: Payer: Self-pay | Admitting: Neurology

## 2015-08-23 ENCOUNTER — Other Ambulatory Visit: Payer: Self-pay | Admitting: Internal Medicine

## 2015-08-23 NOTE — Telephone Encounter (Signed)
[phone] or [phone] pt states that she has a spot on the back of head that is really sore and she states that she has not injured herself and it has been there for months and would like to talk to someone please call her

## 2015-08-23 NOTE — Telephone Encounter (Signed)
Spoke with pt. Pt. Describes the area on her head to be a knot and fill like there is fluid in it, She states that extremely tender to palpation. I advised her that she needs to contact her primary office or an urgent care as I believed this may need to be addressed sooner rather than later. Thanks [facility]

## 2015-08-26 ENCOUNTER — Ambulatory Visit (INDEPENDENT_AMBULATORY_CARE_PROVIDER_SITE_OTHER): Payer: Medicare Other | Admitting: Family Medicine

## 2015-08-26 VITALS — BP 120/76 | HR 85 | Temp 97.9°F | Resp 16 | Ht 62.0 in | Wt 179.6 lb

## 2015-08-26 DIAGNOSIS — G44201 Tension-type headache, unspecified, intractable: Secondary | ICD-10-CM | POA: Diagnosis not present

## 2015-08-26 DIAGNOSIS — R519 Headache, unspecified: Secondary | ICD-10-CM

## 2015-08-26 DIAGNOSIS — R51 Headache: Secondary | ICD-10-CM

## 2015-08-26 NOTE — Progress Notes (Signed)
Patient ID: Ebony Garcia, female   DOB: 19-Jan-1951, 64 y.o.   MRN: [id number]   This chart was scribed for Ebony Haber, MD by [facility], medical scribe at [facility].The patient was seen in exam room 10 and the patient's care was started at 3:38 PM.  Patient ID: Ebony Garcia MRN: [id number], DOB: 04/04/1951, 64 y.o. Date of Encounter: 08/26/2015  Primary Physician: Unice Cobble, MD  Chief Complaint:  Chief Complaint  Patient presents with  . Mass    back of skull on left side, been there for a couple of months  . Headache  . Dizziness  . Depression    triage screening   HPI:  Ebony Garcia is a 64 y.o. female who presents to [facility] complaining of a headache and a mass in the back of her head on the left side that has been present for "many months". She has a constant soreness, which "feels like the size of a lemon". At times it feels like "a bad first degree burn". No rash present anywhere else on her head. On disability for migraines, neurologist is Dr. Tomi Likens, and migraines are well controlled on her current regimen of Topamax.  Past Medical History  Diagnosis Date  . Migraines   . Hypertension   . Diabetes mellitus without complication     Diet controlled  . Depression with anxiety   . Panic attacks   . Cancer   . Depression   . CHF (congestive heart failure)     dystolic heart dysfunction  . Chronic kidney disease     Home Meds: Prior to Admission medications   Medication Sig Start Date End Date Taking? Authorizing Provider  ALPRAZolam Duanne Moron) 1 MG tablet Take 1 mg by mouth at bedtime as needed for sleep.   Yes Historical Provider, MD  aspirin EC 81 MG tablet Take 1 tablet (81 mg total) by mouth daily. 08/19/15  Yes Burtis Junes, NP  benzoyl peroxide-erythromycin [facility]) gel Apply topically 2 (two) times daily as needed. 12/08/12  Yes Hendricks Limes, MD  Estradiol 0.52 MG/0.87 GM (0.06%) GEL  Apply 1 application topically daily. 1 pump   Yes Historical Provider, MD  FLUoxetine (PROZAC) 20 MG capsule Take 20 mg by mouth daily.   Yes Historical Provider, MD  furosemide (LASIX) 20 MG tablet Take 1 tablet (20 mg total) by mouth daily. ---- Pt needs office visit before further refills. 08/23/15  Yes Hendricks Limes, MD  losartan (COZAAR) 50 MG tablet Take 1 tablet (50 mg total) by mouth daily. 11/21/14  Yes Hendricks Limes, MD  nitroGLYCERIN (NITROSTAT) 0.4 MG SL tablet Place 1 tablet (0.4 mg total) under the tongue every 5 (five) minutes as needed for chest pain. 08/19/15  Yes Burtis Junes, NP  promethazine (PHENERGAN) 25 MG tablet Take 1 tablet (25 mg total) by mouth every 6 (six) hours as needed for nausea or vomiting. 02/05/15  Yes Adam Telford Nab, DO  topiramate (TOPAMAX) 50 MG tablet TAKE ONE TABLET BY MOUTH IN THE MORNING AND TWO IN THE EVENING 08/23/15  Yes Adam Telford Nab, DO  AMBULATORY NON FORMULARY MEDICATION Place 4 drops into both nostrils once. Lidocaine 4% 50 ml bottle  4 qtt in each nostril  may repeat in 15 minutes then again in 2 hours in a 24 hour period Patient not taking: Reported on 08/26/2015 08/02/14   Pieter Partridge, DO  fluocinonide gel (LIDEX) 0.05 %  Apply 1 application topically daily. 08/18/13   Historical Provider, MD   Allergies:  Allergies  Allergen Reactions  . Amoxicillin-Pot Clavulanate Diarrhea  . Antihistamines, Diphenhydramine-Type Other (See Comments)    Other Reaction: makes pt nervous  . Amoxicillin-Pot Clavulanate     REACTION: DIARRHEA  . Hydrocodone-Acetaminophen     REACTION: sensitive to  but can take for extreme pain per patient  . Propoxyphene Hcl     REACTION: nausea  . Propoxyphene N-Acetaminophen     REACTION: nausea  . Sulfa Antibiotics     Unknown   . Tramadol Other (See Comments)    Pt does know what kind of effect  . Lisinopril     Restless , irregular sleep   Social History   Social History  . Marital Status: Divorced    Spouse  Name: N/A  . Number of Children: 3  . Years of Education: N/A   Occupational History  .     Social History Main Topics  . Smoking status: Never Smoker   . Smokeless tobacco: Never Used  . Alcohol Use: No  . Drug Use: No  . Sexual Activity:    Partners: Male   Other Topics Concern  . Not on file   Social History Narrative   Lives alone.            Review of Systems: Constitutional: negative for chills, fever, night sweats, weight changes, or fatigue  HEENT: negative for vision changes, hearing loss, congestion, rhinorrhea, ST, epistaxis, or sinus pressure Cardiovascular: negative for chest pain or palpitations Respiratory: negative for hemoptysis, wheezing, shortness of breath, or cough Abdominal: negative for abdominal pain, nausea, vomiting, diarrhea, or constipation Dermatological: negative for rash Neurologic: negative for dizziness, or syncope. Positive for headache. All other systems reviewed and are otherwise negative with the exception to those above and in the HPI.  Physical Exam: Blood pressure 120/76, pulse 85, temperature 97.9 F (36.6 C), temperature source Oral, resp. rate 16, height 5\' 2"  (1.575 m), weight 179 lb 9.6 oz (81.466 kg), SpO2 98 %., Body mass index is 32.84 kg/(m^2). General: Well developed, well nourished, in no acute distress. Head: Normocephalic, atraumatic, eyes without discharge, sclera non-icteric, nares are without discharge. Bilateral auditory canals clear, TM's are without perforation, pearly grey and translucent with reflective cone of light bilaterally. Oral cavity moist, posterior pharynx without exudate, erythema, peritonsillar abscess, or post nasal drip. Mild swelling of her occipital area with erythema.  Neck: Supple. No thyromegaly. Full ROM. No lymphadenopathy. Lungs: Clear bilaterally to auscultation without wheezes, rales, or rhonchi. Breathing is unlabored. Heart: RRR with S1 S2. No murmurs, rubs, or gallops appreciated. Abdomen:  Soft, non-tender, non-distended with normoactive bowel sounds. No hepatomegaly. No rebound/guarding. No obvious abdominal masses. Msk:  Strength and tone normal for age. Extremities/Skin: Warm and dry. No clubbing or cyanosis. No edema. Patient has mild erythema over the left occipital scalp with a small abrasion in that region. There is no definite adenopathy or swelling or localized tenderness. Neuro: Alert and oriented X 3. Moves all extremities spontaneously. Gait is normal. CNII-XII grossly in tact. Psych:  Responds to questions appropriately with a normal affect.   Labs:  ASSESSMENT AND PLAN:  64 y.o. year old female with   By signing my name below, I, Nadim Abuhashem, attest that this documentation has been prepared under the direction and in the presence of Ebony Haber, MD.  Electronically Signed: Lora Havens, medical scribe. 08/26/2015 3:38 PM.  This chart was scribed in  my presence and reviewed by me personally.    ICD-9-CM ICD-10-CM   1. Occipital headache 784.0 R51 MR Brain Wo Contrast  2. Intractable tension-type headache, unspecified chronicity pattern 339.10 G44.201 MR Brain Wo Contrast  3. Headache disorder 784.0 R51 MR Brain Wo Contrast     Signed, Ebony Haber, MD

## 2015-08-26 NOTE — Patient Instructions (Addendum)
After the MRI, we'll see if medicine or neurosurgery is a better option. I suspect you have occipital neuralgia which is usually treated with medicine.

## 2015-08-27 ENCOUNTER — Ambulatory Visit (HOSPITAL_COMMUNITY)
Admission: RE | Admit: 2015-08-27 | Discharge: 2015-08-27 | Disposition: A | Discharge status: Home / Self Care | Payer: Medicare Other | Source: Ambulatory Visit | Attending: Family Medicine | Admitting: Family Medicine

## 2015-08-27 DIAGNOSIS — G43909 Migraine, unspecified, not intractable, without status migrainosus: Secondary | ICD-10-CM | POA: Diagnosis not present

## 2015-08-27 DIAGNOSIS — R51 Headache: Secondary | ICD-10-CM | POA: Diagnosis not present

## 2015-08-27 DIAGNOSIS — C801 Malignant (primary) neoplasm, unspecified: Secondary | ICD-10-CM | POA: Diagnosis not present

## 2015-08-27 DIAGNOSIS — G44201 Tension-type headache, unspecified, intractable: Secondary | ICD-10-CM

## 2015-08-27 DIAGNOSIS — R519 Headache, unspecified: Secondary | ICD-10-CM

## 2015-09-11 DIAGNOSIS — A6000 Herpesviral infection of urogenital system, unspecified: Secondary | ICD-10-CM | POA: Diagnosis not present

## 2015-09-11 DIAGNOSIS — Z1231 Encounter for screening mammogram for malignant neoplasm of breast: Secondary | ICD-10-CM | POA: Diagnosis not present

## 2015-09-11 DIAGNOSIS — Z01411 Encounter for gynecological examination (general) (routine) with abnormal findings: Secondary | ICD-10-CM | POA: Diagnosis not present

## 2015-09-11 DIAGNOSIS — N951 Menopausal and female climacteric states: Secondary | ICD-10-CM | POA: Diagnosis not present

## 2015-09-11 DIAGNOSIS — Z23 Encounter for immunization: Secondary | ICD-10-CM | POA: Diagnosis not present

## 2015-09-11 DIAGNOSIS — Z124 Encounter for screening for malignant neoplasm of cervix: Secondary | ICD-10-CM | POA: Diagnosis not present

## 2015-09-11 LAB — HM MAMMOGRAPHY

## 2015-10-07 ENCOUNTER — Other Ambulatory Visit: Payer: Self-pay | Admitting: Neurology

## 2015-10-07 DIAGNOSIS — R110 Nausea: Secondary | ICD-10-CM

## 2015-10-14 DIAGNOSIS — F330 Major depressive disorder, recurrent, mild: Secondary | ICD-10-CM | POA: Diagnosis not present

## 2015-11-20 ENCOUNTER — Other Ambulatory Visit: Payer: Self-pay | Admitting: Internal Medicine

## 2015-11-25 ENCOUNTER — Telehealth: Payer: Self-pay | Admitting: Internal Medicine

## 2015-11-25 ENCOUNTER — Other Ambulatory Visit: Payer: Self-pay | Admitting: Internal Medicine

## 2015-11-25 NOTE — Telephone Encounter (Signed)
Patient has made transfer appt to Dr. Quay Burow for end of January.  Needs medication refill on furosemide until then.  Patient uses [facility] on [address].

## 2015-11-26 ENCOUNTER — Encounter: Payer: Self-pay | Admitting: Family Medicine

## 2015-11-26 MED ORDER — FUROSEMIDE 20 MG PO TABS: 20.0000 mg | ORAL_TABLET | Freq: Every day | ORAL | Status: DC

## 2015-11-26 NOTE — Telephone Encounter (Signed)
Refilled furosemide until her appointment with Dr. Quay Burow.

## 2015-12-03 ENCOUNTER — Encounter: Payer: Self-pay | Admitting: Neurology

## 2015-12-11 ENCOUNTER — Encounter: Payer: Self-pay | Admitting: Family Medicine

## 2015-12-11 DIAGNOSIS — I10 Essential (primary) hypertension: Secondary | ICD-10-CM

## 2015-12-11 MED ORDER — LOSARTAN POTASSIUM 50 MG PO TABS: 50.0000 mg | ORAL_TABLET | Freq: Every day | ORAL | Status: DC

## 2015-12-11 NOTE — Telephone Encounter (Signed)
Losartan 50mg  #30, 0 RF sent to pharmacy to hold pt over until appt next week.

## 2015-12-13 ENCOUNTER — Encounter: Payer: Self-pay | Admitting: Family Medicine

## 2015-12-16 ENCOUNTER — Ambulatory Visit: Payer: Medicare Other | Admitting: Family Medicine

## 2015-12-17 ENCOUNTER — Other Ambulatory Visit: Payer: Self-pay | Admitting: Neurology

## 2015-12-18 NOTE — Telephone Encounter (Signed)
Last OV: 02/05/15 Next OV: 0/0/00

## 2015-12-26 ENCOUNTER — Ambulatory Visit (INDEPENDENT_AMBULATORY_CARE_PROVIDER_SITE_OTHER): Payer: Medicare Other | Admitting: Family Medicine

## 2015-12-26 ENCOUNTER — Encounter: Payer: Self-pay | Admitting: Family Medicine

## 2015-12-26 VITALS — BP 120/82 | HR 69 | Temp 98.0°F | Ht 62.0 in | Wt 182.6 lb

## 2015-12-26 DIAGNOSIS — E785 Hyperlipidemia, unspecified: Secondary | ICD-10-CM

## 2015-12-26 DIAGNOSIS — I10 Essential (primary) hypertension: Secondary | ICD-10-CM

## 2015-12-26 DIAGNOSIS — D049 Carcinoma in situ of skin, unspecified: Secondary | ICD-10-CM | POA: Insufficient documentation

## 2015-12-26 DIAGNOSIS — I5189 Other ill-defined heart diseases: Secondary | ICD-10-CM

## 2015-12-26 DIAGNOSIS — I519 Heart disease, unspecified: Secondary | ICD-10-CM

## 2015-12-26 DIAGNOSIS — I5031 Acute diastolic (congestive) heart failure: Secondary | ICD-10-CM

## 2015-12-26 HISTORY — DX: Carcinoma in situ of skin, unspecified: D04.9

## 2015-12-26 MED ORDER — LOSARTAN POTASSIUM 50 MG PO TABS: 50.0000 mg | ORAL_TABLET | Freq: Every day | ORAL | Status: DC

## 2015-12-26 MED ORDER — FUROSEMIDE 20 MG PO TABS: 20.0000 mg | ORAL_TABLET | Freq: Every day | ORAL | Status: DC

## 2015-12-26 NOTE — Progress Notes (Signed)
Pre visit review using our clinic review tool, if applicable. No additional management support is needed unless otherwise documented below in the visit note. 

## 2015-12-26 NOTE — Patient Instructions (Signed)
Hypertension Hypertension, commonly called high blood pressure, is when the force of blood pumping through your arteries is too strong. Your arteries are the blood vessels that carry blood from your heart throughout your body. A blood pressure reading consists of a higher number over a lower number, such as 110/72. The higher number (systolic) is the pressure inside your arteries when your heart pumps. The lower number (diastolic) is the pressure inside your arteries when your heart relaxes. Ideally you want your blood pressure below 120/80. Hypertension forces your heart to work harder to pump blood. Your arteries may become narrow or stiff. Having untreated or uncontrolled hypertension can cause heart attack, stroke, kidney disease, and other problems. RISK FACTORS Some risk factors for high blood pressure are controllable. Others are not.  Risk factors you cannot control include:   Race. You may be at higher risk if you are African American.  Age. Risk increases with age.  Gender. Men are at higher risk than women before age 45 years. After age 65, women are at higher risk than men. Risk factors you can control include:  Not getting enough exercise or physical activity.  Being overweight.  Getting too much fat, sugar, calories, or salt in your diet.  Drinking too much alcohol. SIGNS AND SYMPTOMS Hypertension does not usually cause signs or symptoms. Extremely high blood pressure (hypertensive crisis) may cause headache, anxiety, shortness of breath, and nosebleed. DIAGNOSIS To check if you have hypertension, your health care provider will measure your blood pressure while you are seated, with your arm held at the level of your heart. It should be measured at least twice using the same arm. Certain conditions can cause a difference in blood pressure between your right and left arms. A blood pressure reading that is higher than normal on one occasion does not mean that you need treatment. If  it is not clear whether you have high blood pressure, you may be asked to return on a different day to have your blood pressure checked again. Or, you may be asked to monitor your blood pressure at home for 1 or more weeks. TREATMENT Treating high blood pressure includes making lifestyle changes and possibly taking medicine. Living a healthy lifestyle can help lower high blood pressure. You may need to change some of your habits. Lifestyle changes may include:  Following the DASH diet. This diet is high in fruits, vegetables, and whole grains. It is low in salt, red meat, and added sugars.  Keep your sodium intake below 2,300 mg per day.  Getting at least 30-45 minutes of aerobic exercise at least 4 times per week.  Losing weight if necessary.  Not smoking.  Limiting alcoholic beverages.  Learning ways to reduce stress. Your health care provider may prescribe medicine if lifestyle changes are not enough to get your blood pressure under control, and if one of the following is true:  You are 18-59 years of age and your systolic blood pressure is above 140.  You are 60 years of age or older, and your systolic blood pressure is above 150.  Your diastolic blood pressure is above 90.  You have diabetes, and your systolic blood pressure is over 140 or your diastolic blood pressure is over 90.  You have kidney disease and your blood pressure is above 140/90.  You have heart disease and your blood pressure is above 140/90. Your personal target blood pressure may vary depending on your medical conditions, your age, and other factors. HOME CARE INSTRUCTIONS    Have your blood pressure rechecked as directed by your health care provider.   Take medicines only as directed by your health care provider. Follow the directions carefully. Blood pressure medicines must be taken as prescribed. The medicine does not work as well when you skip doses. Skipping doses also puts you at risk for  problems.  Do not smoke.   Monitor your blood pressure at home as directed by your health care provider. SEEK MEDICAL CARE IF:   You think you are having a reaction to medicines taken.  You have recurrent headaches or feel dizzy.  You have swelling in your ankles.  You have trouble with your vision. SEEK IMMEDIATE MEDICAL CARE IF:  You develop a severe headache or confusion.  You have unusual weakness, numbness, or feel faint.  You have severe chest or abdominal pain.  You vomit repeatedly.  You have trouble breathing. MAKE SURE YOU:   Understand these instructions.  Will watch your condition.  Will get help right away if you are not doing well or get worse.   This information is not intended to replace advice given to you by your health care provider. Make sure you discuss any questions you have with your health care provider.   Document Released: 11/23/2005 Document Revised: 04/09/2015 Document Reviewed: 09/15/2013 Elsevier Interactive Patient Education 2016 Elsevier Inc[employer]  

## 2015-12-26 NOTE — Assessment & Plan Note (Signed)
Stable Refill meds rto for cpe and labs

## 2015-12-26 NOTE — Progress Notes (Signed)
Patient ID: Ebony Garcia, female    DOB: 03-07-1951  Age: 65 y.o. MRN: [id number]    Subjective:  Subjective HPI Ebony Garcia presents for f/u bp.  No complaints.    Review of Systems  Constitutional: Negative for diaphoresis, appetite change, fatigue and unexpected weight change.  Eyes: Negative for pain, redness and visual disturbance.  Respiratory: Negative for cough, chest tightness, shortness of breath and wheezing.   Cardiovascular: Negative for chest pain, palpitations and leg swelling.  Endocrine: Negative for cold intolerance, heat intolerance, polydipsia, polyphagia and polyuria.  Genitourinary: Negative for dysuria, frequency and difficulty urinating.  Neurological: Negative for dizziness, light-headedness, numbness and headaches.    History Past Medical History  Diagnosis Date  . Migraines   . Hypertension   . Diabetes mellitus without complication ([place])     Diet controlled  . Depression with anxiety   . Panic attacks   . Cancer ([place])   . Depression   . CHF (congestive heart failure) ([place])     dystolic heart dysfunction  . Chronic kidney disease     She has past surgical history that includes Total abdominal hysterectomy w/ bilateral salpingoophorectomy; Tonsillectomy and adenoidectomy; Nasal septum surgery; Tubal ligation; Dilation and curettage of uterus; papaloma (Right); Breast surgery; and Abdominal hysterectomy.   Her family history includes Atrial fibrillation in her mother; Bipolar disorder in her brother and sister; Bladder Cancer in her father; Crohn's disease in her mother; Heart attack in her maternal grandmother; Heart failure in her mother; Prostate cancer in her father. There is no history of Diabetes or Stroke.She reports that she has never smoked. She has never used smokeless tobacco. She reports that she does not drink alcohol or use illicit drugs.  Current Outpatient Prescriptions on File Prior to Visit  Medication Sig Dispense  Refill  . ALPRAZolam (XANAX) 1 MG tablet Take 1 mg by mouth at bedtime as needed for sleep.    Marland Kitchen aspirin EC 81 MG tablet Take 1 tablet (81 mg total) by mouth daily. 90 tablet 3  . benzoyl peroxide-erythromycin (BENZAMYCIN) gel Apply topically 2 (two) times daily as needed.    . fluocinonide gel (LIDEX) [id number] % Apply 1 application topically daily.    Marland Kitchen FLUoxetine (PROZAC) 20 MG capsule Take 20 mg by mouth daily.    . nitroGLYCERIN (NITROSTAT) 0.4 MG SL tablet Place 1 tablet (0.4 mg total) under the tongue every 5 (five) minutes as needed for chest pain. 25 tablet 3  . topiramate (TOPAMAX) 50 MG tablet TAKE ONE TABLET BY MOUTH IN THE MORNING AND TWO IN THE EVENING 90 tablet 0   No current facility-administered medications on file prior to visit.     Objective:  Objective Physical Exam  Constitutional: She is oriented to person, place, and time. She appears well-developed and well-nourished.  HENT:  Head: Normocephalic and atraumatic.  Eyes: Conjunctivae and EOM are normal.  Neck: Normal range of motion. Neck supple. No JVD present. Carotid bruit is not present. No thyromegaly present.  Cardiovascular: Normal rate, regular rhythm and normal heart sounds.   No murmur heard. Pulmonary/Chest: Effort normal and breath sounds normal. No respiratory distress. She has no wheezes. She has no rales. She exhibits no tenderness.  Musculoskeletal: She exhibits no edema.  Neurological: She is alert and oriented to person, place, and time.  Psychiatric: She has a normal mood and affect.  Nursing note and vitals reviewed.  BP 120/82 mmHg  Pulse 69  Temp(Src) 98 F (36.7 C) (Oral)  Ht 5\' 2"  (1.575 m)  Wt 182 lb 9.6 oz (82.827 kg)  BMI 33.39 kg/m2  SpO2 98% Wt Readings from Last 3 Encounters:  12/26/15 182 lb 9.6 oz (82.827 kg)  08/26/15 179 lb 9.6 oz (81.466 kg)  08/19/15 177 lb 9.6 oz (80.559 kg)     Lab Results  Component Value Date   WBC 6.8 08/19/2015   HGB 13.3 08/19/2015   HCT 40.2  08/19/2015   PLT 142.0* 08/19/2015   GLUCOSE 86 08/19/2015   CHOL 196 08/19/2015   TRIG 191.0* 08/19/2015   HDL 51.00 08/19/2015   LDLDIRECT 98.1 01/21/2011   LDLCALC 107* 08/19/2015   ALT 13 08/19/2015   AST 16 08/19/2015   NA 140 08/19/2015   K 4.3 08/19/2015   CL 109 08/19/2015   CREATININE 0.91 08/19/2015   BUN 15 08/19/2015   CO2 24 08/19/2015   TSH 1.81 08/19/2015   INR 0.93 07/19/2010   HGBA1C 5.9 12/08/2012   MICROALBUR 0.7 04/18/2012    Mr Brain Wo Contrast  08/27/2015  CLINICAL DATA:  65 year old hypertensive diabetic female with headache. Mass in back of her head on the left side which has been present for many months. On disability for migraines. Cancer, type not specified. Initial encounter. EXAM: MRI HEAD WITHOUT CONTRAST TECHNIQUE: Multiplanar, multiecho pulse sequences of the brain and surrounding structures were obtained without intravenous contrast. COMPARISON:  08/15/2008 brain MR. FINDINGS: No mass noted left-side of head. There is slight asymmetry of subcutaneous fat. Question if this is what patient perceives as an abnormality. No acute infarct. No intracranial hemorrhage. Minimal punctate nonspecific white matter type changes most notable frontal lobes. This may be related to patient's migraine headaches or small vessel disease. No intracranial mass lesion noted on this unenhanced exam. No hydrocephalus. Major intracranial vascular structures are patent. Partial opacification left mastoid air cells. No obstructing lesion of the eustachian tube identified. Tiny parotid lesions suggestive of small lymph nodes. Minimal to mild mucosal thickening frontal sinuses, ethmoid sinus air cells and maxillary sinuses. Mild transverse ligament hypertrophy. Cervicomedullary junction, pituitary region, pineal region orbital structures unremarkable. IMPRESSION: No mass noted left-side of head. There is slight asymmetry of subcutaneous fat. Question if this is what patient perceives as an  abnormality. No acute infarct. No intracranial hemorrhage. Minimal punctate nonspecific white matter type changes most notable frontal lobes. This may be related to patient's migraine headaches or small vessel disease. No intracranial mass lesion noted on this unenhanced exam. Partial opacification left mastoid air cells. No obstructing lesion of the eustachian tube identified. Minimal to mild mucosal thickening frontal sinuses, ethmoid sinus air cells and maxillary sinuses. Electronically Signed   By: Genia Del M.D.   On: 08/27/2015 10:03     Assessment & Plan:  Plan I have discontinued Ms. Gebhard's AMBULATORY NON FORMULARY MEDICATION and promethazine. I have also changed her furosemide. Additionally, I am having her maintain her FLUoxetine, benzoyl peroxide-erythromycin, fluocinonide gel, ALPRAZolam, nitroGLYCERIN, aspirin EC, topiramate, estradiol, and losartan.  Meds ordered this encounter  Medications  . estradiol (ESTRACE) 0.5 MG tablet    Sig: Take 1 tablet by mouth daily.  . furosemide (LASIX) 20 MG tablet    Sig: Take 1 tablet (20 mg total) by mouth daily.    Dispense:  90 tablet    Refill:  1  . losartan (COZAAR) 50 MG tablet    Sig: Take 1 tablet (50 mg total) by mouth daily.    Dispense:  90 tablet  Refill:  1    Problem List Items Addressed This Visit      Unprioritized   Squamous cell carcinoma in situ of skin    F/u derm      Hyperlipidemia    Pt is getting back on track with diet and exercise rto for cpe and will repeat labs then      Relevant Medications   furosemide (LASIX) 20 MG tablet   losartan (COZAAR) 50 MG tablet   Essential hypertension - Primary    Stable Refill meds rto for cpe and labs      Relevant Medications   furosemide (LASIX) 20 MG tablet   losartan (COZAAR) 50 MG tablet   Acute diastolic CHF (congestive heart failure) ([facility])    con't lasix      Relevant Medications   furosemide (LASIX) 20 MG tablet   losartan (COZAAR) 50  MG tablet    Other Visit Diagnoses    Diastolic dysfunction        Relevant Medications    furosemide (LASIX) 20 MG tablet       Follow-up: Return in about 3 months (around 03/25/2016), or if symptoms worsen or fail to improve, for hyperlipidemia, hypertension.  Garnet Koyanagi, DO

## 2015-12-26 NOTE — Assessment & Plan Note (Signed)
Patient is normotensive Monitor blood pressure closely 

## 2015-12-26 NOTE — Assessment & Plan Note (Signed)
Pt is getting back on track with diet and exercise rto for cpe and will repeat labs then

## 2015-12-30 ENCOUNTER — Ambulatory Visit: Payer: Medicare Other | Admitting: Internal Medicine

## 2016-01-20 ENCOUNTER — Other Ambulatory Visit: Payer: Self-pay

## 2016-01-20 DIAGNOSIS — L814 Other melanin hyperpigmentation: Secondary | ICD-10-CM | POA: Diagnosis not present

## 2016-01-20 DIAGNOSIS — L817 Pigmented purpuric dermatosis: Secondary | ICD-10-CM | POA: Diagnosis not present

## 2016-01-20 DIAGNOSIS — Z85828 Personal history of other malignant neoplasm of skin: Secondary | ICD-10-CM | POA: Diagnosis not present

## 2016-01-20 DIAGNOSIS — D1801 Hemangioma of skin and subcutaneous tissue: Secondary | ICD-10-CM | POA: Diagnosis not present

## 2016-01-20 DIAGNOSIS — L570 Actinic keratosis: Secondary | ICD-10-CM | POA: Diagnosis not present

## 2016-01-20 DIAGNOSIS — L309 Dermatitis, unspecified: Secondary | ICD-10-CM | POA: Diagnosis not present

## 2016-01-20 MED ORDER — TOPIRAMATE 50 MG PO TABS: ORAL_TABLET | ORAL | Status: DC

## 2016-01-20 NOTE — Telephone Encounter (Signed)
Last OV: 02/05/15 Next OV: 0/0/0 NEEDS REFILL. MYCHART MESSAGE SENT and IT WAS ATTACHED TO RX.

## 2016-01-21 DIAGNOSIS — L920 Granuloma annulare: Secondary | ICD-10-CM | POA: Diagnosis not present

## 2016-02-27 ENCOUNTER — Encounter: Payer: Medicare Other | Admitting: Family Medicine

## 2016-03-19 ENCOUNTER — Other Ambulatory Visit: Payer: Self-pay | Admitting: Neurology

## 2016-03-20 ENCOUNTER — Other Ambulatory Visit: Payer: Self-pay | Admitting: Neurology

## 2016-03-23 ENCOUNTER — Telehealth: Payer: Self-pay | Admitting: Neurology

## 2016-03-23 MED ORDER — TOPIRAMATE 50 MG PO TABS: ORAL_TABLET | ORAL | Status: DC

## 2016-03-23 NOTE — Telephone Encounter (Signed)
Pt was scheduled. 1 month sent in to get her to appointment. She is aware if she misses she will not receive any more refills.

## 2016-03-23 NOTE — Telephone Encounter (Signed)
Last OV: 02/05/15 Next OV: 0/0/0

## 2016-03-23 NOTE — Telephone Encounter (Signed)
Ebony Garcia 01/15/2051 called and would like a refill on her Topamax 50 mg medication. [facility] on [address]. She said she only has one left for tomorrow. Her call back number is [phone]. Thank you

## 2016-04-22 ENCOUNTER — Ambulatory Visit (INDEPENDENT_AMBULATORY_CARE_PROVIDER_SITE_OTHER): Payer: Medicare Other | Admitting: Neurology

## 2016-04-22 ENCOUNTER — Encounter: Payer: Self-pay | Admitting: Neurology

## 2016-04-22 VITALS — BP 112/80 | HR 85 | Ht 62.0 in | Wt 186.0 lb

## 2016-04-22 DIAGNOSIS — F32A Depression, unspecified: Secondary | ICD-10-CM

## 2016-04-22 DIAGNOSIS — G43719 Chronic migraine without aura, intractable, without status migrainosus: Secondary | ICD-10-CM | POA: Diagnosis not present

## 2016-04-22 DIAGNOSIS — F329 Major depressive disorder, single episode, unspecified: Secondary | ICD-10-CM

## 2016-04-22 MED ORDER — KETOROLAC TROMETHAMINE 10 MG PO TABS: 10.0000 mg | ORAL_TABLET | Freq: Four times a day (QID) | ORAL | Status: DC | PRN

## 2016-04-22 NOTE — Patient Instructions (Signed)
1.  Continue topamax 50mg  in AM and 100mg  at bedtime.  Contact me if you wish to switch to extended release form 2.  For acute headache attacks, try ketorolac 10mg .  May take up to every 4 hours as needed.  Limit to no more than 2 days out of the week 3.  Will need to check kidney function (which will be checked on Friday) 4.  Follow up in 5 months.

## 2016-04-22 NOTE — Progress Notes (Signed)
NEUROLOGY FOLLOW UP OFFICE NOTE  Seleni T Garcia OV:[id number]  HISTORY OF PRESENT ILLNESS: Ebony Garcia is a 65 year old right-handed woman with hypertension, heart failure, renal calculi, depression, hyperlipidemia and CHF who follows up for migraine headache.   UPDATE: She is unable to afford Botox.  Migraine headaches have decreased with topamax but she still has daily headache.  Topamax does cause memory problems, mood changes and hair loss for her.  She has had increased stress over the past year which have worsened her headaches.  Her brother was diagnosed with [place] in 12-25-2022 and passed away in 2023/02/23.  Her father has stage IV bladder cancer.  She has no relationship with her grown children and is not allowed to see her grandchildren.  She also lost [employer] and has no source of income.  Current NSAIDS:  no Current analgesics:  She tried Vicodin once, which helped. Current triptans:  contraindicated Current anti-emetic:  no Current muscle relaxants:  no Current anti-anxiolytic:  Xanax 1mg  Current sleep aide:  Xanax 1mg  Current Antihypertensive medications:  Cozaar, Lasix Current Antidepressant medications:  no Current Anticonvulsant medications:  topamax 150mg   BMP from September 2016 was normal with BUN 15 and Cr 0.91.  HISTORY: Onset:  Since late 51s Location: usually left sided, periorbital, and back of head.  More recently, holocephalic Quality:  Sharp, aching pressure.  Non-throbbing Initial intensity:  7/10 Associated symptoms:  Nausea, photophobia and phonophobia.  No visual disturbance, osmophobia or vomiting Aura:  no Initial duration:  3 days Initial frequency:  20-22 headache days per month (or 5 headache free days per month) Triggers/exacerbating factors:  stress Relieving factors:  Cold mask, heating pack on neck and shoulders  Past abortive therapy:  Tylenol (ineffective), ice & heat (helps some), Imitrex 100mg , Zomig (effective), Vicodin, Frova,  indomethacin, DHE (effective), magnesium (ineffective), feverfew (ineffective), Midrin (effective but made sleepy), naproxen (ineffective), toradol po (ineffective), Toradol 60mg  IM (effective), Excedrin migraine (ineffective), Lidocaine nasal drops (effective but caused hives), Tramadol (side effects), Cambia (side effects)  Past preventative therapy:  propranolol (ineffective), Botox (tried once but gave flu symptoms), occipital nerve blocks, trigger point injections, amitriptyline (side effects), Depakote (hair loss), Effexor (for depression but no headache improvement on it), Lamictal (side effects), atenolol (ineffective)  Family history of headache:  Mother, grandmother, great-aunt, cousins.  08/16/08 MRI Brain w/wo performed for "explosive headaches": nonspecific punctate hyperintensities in the subcortical white matter. 08/16/08 MRA Head: Unremarkable.  There is mild attenuation in the vertebrobasilar junction and proximal basilar artery which is likely artifact.  PAST MEDICAL HISTORY: Past Medical History  Diagnosis Date  . Migraines   . Hypertension   . Diabetes mellitus without complication ([place])     Diet controlled  . Depression with anxiety   . Panic attacks   . Cancer ([place])   . Depression   . CHF (congestive heart failure) ([place])     dystolic heart dysfunction  . Chronic kidney disease     MEDICATIONS: Current Outpatient Prescriptions on File Prior to Visit  Medication Sig Dispense Refill  . ALPRAZolam (XANAX) 1 MG tablet Take 1 mg by mouth at bedtime as needed for sleep.    Marland Kitchen aspirin EC 81 MG tablet Take 1 tablet (81 mg total) by mouth daily. 90 tablet 3  . benzoyl peroxide-erythromycin (BENZAMYCIN) gel Apply topically 2 (two) times daily as needed.    Marland Kitchen estradiol (ESTRACE) 0.5 MG tablet Take 1 tablet by mouth daily.    . fluocinonide gel (LIDEX)  [id number] % Apply 1 application topically daily.    Marland Kitchen FLUoxetine (PROZAC) 20 MG capsule Take 20 mg by mouth daily.    . furosemide  (LASIX) 20 MG tablet Take 1 tablet (20 mg total) by mouth daily. 90 tablet 1  . losartan (COZAAR) 50 MG tablet Take 1 tablet (50 mg total) by mouth daily. 90 tablet 1  . nitroGLYCERIN (NITROSTAT) 0.4 MG SL tablet Place 1 tablet (0.4 mg total) under the tongue every 5 (five) minutes as needed for chest pain. 25 tablet 3  . topiramate (TOPAMAX) 50 MG tablet TAKE ONE TABLET BY MOUTH IN THE MORNING AND TWO IN THE EVENING 90 tablet 0   No current facility-administered medications on file prior to visit.    ALLERGIES: Allergies  Allergen Reactions  . Amoxicillin-Pot Clavulanate Diarrhea  . Antihistamines, Diphenhydramine-Type Other (See Comments)    Other Reaction: makes pt nervous  . Diphenhydramine Hcl Other (See Comments)    Other Reaction: makes pt nervous  . Amoxicillin-Pot Clavulanate     REACTION: DIARRHEA  . Hydrocodone-Acetaminophen     REACTION: sensitive to  but can take for extreme pain per patient  . Propoxyphene Hcl     REACTION: nausea  . Propoxyphene N-Acetaminophen     REACTION: nausea  . Sulfa Antibiotics     Unknown   . Tramadol Other (See Comments)    Pt does know what kind of effect  . Lisinopril     Restless , irregular sleep    FAMILY HISTORY: Family History  Problem Relation Age of Onset  . Crohn's disease Mother   . Atrial fibrillation Mother   . Heart failure Mother   . Bladder Cancer Father   . Prostate cancer Father   . Bipolar disorder Sister   . Bipolar disorder Brother   . Heart attack Maternal Grandmother     >65  . Diabetes Neg Hx   . Stroke Neg Hx     SOCIAL HISTORY: Social History   Social History  . Marital Status: Divorced    Spouse Name: N/A  . Number of Children: 3  . Years of Education: N/A   Occupational History  .     Social History Main Topics  . Smoking status: Never Smoker   . Smokeless tobacco: Never Used  . Alcohol Use: No  . Drug Use: No  . Sexual Activity:    Partners: Male   Other Topics Concern  . Not on  file   Social History Narrative   Lives alone.            REVIEW OF SYSTEMS: Constitutional: No fevers, chills, or sweats, no generalized fatigue, change in appetite Eyes: No visual changes, double vision, eye pain Ear, nose and throat: No hearing loss, ear pain, nasal congestion, sore throat Cardiovascular: No chest pain, palpitations Respiratory:  No shortness of breath at rest or with exertion, wheezes GastrointestinaI: No nausea, vomiting, diarrhea, abdominal pain, fecal incontinence Genitourinary:  No dysuria, urinary retention or frequency Musculoskeletal:  No neck pain, back pain Integumentary: No rash, pruritus, skin lesions Neurological: as above Psychiatric: depression, insomnia Endocrine: No palpitations, fatigue, diaphoresis, mood swings, change in appetite, change in weight, increased thirst Hematologic/Lymphatic:  No purpura, petechiae. Allergic/Immunologic: no itchy/runny eyes, nasal congestion, recent allergic reactions, rashes  PHYSICAL EXAM: Filed Vitals:   04/22/16 0733  BP: 112/80  Pulse: 85   General: No acute distress.  Patient appears well-groomed.  Depressed Head:  Normocephalic/atraumatic Eyes:  Fundi examined but not visualized  Neck: supple, no paraspinal tenderness, full range of motion Heart:  Regular rate and rhythm Lungs:  Clear to auscultation bilaterally Back: No paraspinal tenderness Neurological Exam: alert and oriented to person, place, and time. Attention span and concentration intact, recent and remote memory intact, fund of knowledge intact.  Speech fluent and not dysarthric, language intact.  CN II-XII intact. Bulk and tone normal, muscle strength 5/5 throughout.  Sensation to light touch, temperature and vibration intact.  Deep tendon reflexes 2+ throughout, toes downgoing.  Finger to nose and heel to shin testing intact.  Gait normal, Romberg negative.  IMPRESSION: Migraine with chronic daily headaches (chronic migraine) Depression.   Imperative that this be treated.  PLAN: 1.  Will try ketorolac 10mg  every 6 hours as needed for abortive therapy 2.  She wishes to continue topamax 50mg  in AM and 100mg  in PM for now.  Suggested switching to extended-release form, as it may mitigate memory issues. 3.  Increase exercise 4.  She is scheduled to have routine labs drawn (including renal function) on Friday. 5.  Follow up in 5 months or as needed.  26 minutes spent face to face with patient, over 50% spent discussing management.  Metta Clines, DO  CC:  Garnet Koyanagi, DO

## 2016-04-23 ENCOUNTER — Other Ambulatory Visit: Payer: Self-pay | Admitting: Neurology

## 2016-04-24 ENCOUNTER — Encounter: Payer: Self-pay | Admitting: Family Medicine

## 2016-04-24 ENCOUNTER — Ambulatory Visit (INDEPENDENT_AMBULATORY_CARE_PROVIDER_SITE_OTHER): Payer: Medicare Other | Admitting: Family Medicine

## 2016-04-24 VITALS — BP 116/84 | HR 69 | Temp 97.9°F | Ht 62.0 in | Wt 187.6 lb

## 2016-04-24 DIAGNOSIS — Z0000 Encounter for general adult medical examination without abnormal findings: Secondary | ICD-10-CM | POA: Diagnosis not present

## 2016-04-24 DIAGNOSIS — E785 Hyperlipidemia, unspecified: Secondary | ICD-10-CM | POA: Diagnosis not present

## 2016-04-24 DIAGNOSIS — G43001 Migraine without aura, not intractable, with status migrainosus: Secondary | ICD-10-CM | POA: Diagnosis not present

## 2016-04-24 DIAGNOSIS — I10 Essential (primary) hypertension: Secondary | ICD-10-CM | POA: Diagnosis not present

## 2016-04-24 LAB — CBC WITH DIFFERENTIAL/PLATELET
Basophils Absolute: 0 10*3/uL (ref 0.0–0.1)
Basophils Relative: 0.5 % (ref 0.0–3.0)
Eosinophils Absolute: 0.2 10*3/uL (ref 0.0–0.7)
Eosinophils Relative: 2.1 % (ref 0.0–5.0)
HCT: 39.1 % (ref 36.0–46.0)
Hemoglobin: 12.9 g/dL (ref 12.0–15.0)
Lymphocytes Relative: 15.8 % (ref 12.0–46.0)
Lymphs Abs: 1.2 10*3/uL (ref 0.7–4.0)
MCHC: 32.9 g/dL (ref 30.0–36.0)
MCV: 91 fl (ref 78.0–100.0)
Monocytes Absolute: 0.4 10*3/uL (ref 0.1–1.0)
Monocytes Relative: 5.2 % (ref 3.0–12.0)
Neutro Abs: 6 10*3/uL (ref 1.4–7.7)
Neutrophils Relative %: 76.4 % (ref 43.0–77.0)
Platelets: 143 10*3/uL — ABNORMAL LOW (ref 150.0–400.0)
RBC: 4.3 Mil/uL (ref 3.87–5.11)
RDW: 13.6 % (ref 11.5–15.5)
WBC: 7.8 10*3/uL (ref 4.0–10.5)

## 2016-04-24 LAB — COMPREHENSIVE METABOLIC PANEL
ALT: 15 U/L (ref 0–35)
AST: 16 U/L (ref 0–37)
Albumin: 4.2 g/dL (ref 3.5–5.2)
Alkaline Phosphatase: 69 U/L (ref 39–117)
BUN: 12 mg/dL (ref 6–23)
CO2: 23 mEq/L (ref 19–32)
Calcium: 8.8 mg/dL (ref 8.4–10.5)
Chloride: 108 mEq/L (ref 96–112)
Creatinine, Ser: 0.86 mg/dL (ref 0.40–1.20)
GFR: 70.93 mL/min (ref >60.00)
Glucose, Bld: 102 mg/dL — ABNORMAL HIGH (ref 70–99)
Potassium: 3.6 mEq/L (ref 3.5–5.1)
Sodium: 139 mEq/L (ref 135–145)
Total Bilirubin: 0.3 mg/dL (ref 0.2–1.2)
Total Protein: 6.9 g/dL (ref 6.0–8.3)

## 2016-04-24 LAB — LIPID PANEL
Cholesterol: 188 mg/dL (ref 0–200)
HDL: 45.3 mg/dL (ref >39.00)
LDL Cholesterol: 103 mg/dL — ABNORMAL HIGH (ref 0–99)
NonHDL: 143.06
Total CHOL/HDL Ratio: 4
Triglycerides: 199 mg/dL — ABNORMAL HIGH (ref 0.0–149.0)
VLDL: 39.8 mg/dL (ref 0.0–40.0)

## 2016-04-24 MED ORDER — KETOROLAC TROMETHAMINE 60 MG/2ML IM SOLN
60.0000 mg | Freq: Once | INTRAMUSCULAR | Status: AC
  Administered 2016-04-24: 60 mg via INTRAMUSCULAR

## 2016-04-24 NOTE — Progress Notes (Signed)
Subjective:   Ebony Garcia is a 65 y.o. female who presents for Medicare Annual (Subsequent) wellness exam and refills on meds.  Pt has a lot of depression and feels alone.  She is not suicidal but has been alienated by her sons and her ex husband.  Pt stuggling with severe migraines and sees Neuro.  She is unable to afford botox due to no supplemental ins.  She only has medicare.    Review of Systems:   Review of Systems  Constitutional: Negative for activity change, appetite change and fatigue.  HENT: Negative for hearing loss, congestion, tinnitus and ear discharge.   Eyes: Negative for visual disturbance (see optho q1y -- vision corrected to 20/20 with glasses).  Respiratory: Negative for cough, chest tightness and shortness of breath.   Cardiovascular: Negative for chest pain, palpitations and leg swelling.  Gastrointestinal: Negative for abdominal pain, diarrhea, constipation and abdominal distention.  Genitourinary: Negative for urgency, frequency, decreased urine volume and difficulty urinating.  Musculoskeletal: Negative for back pain, arthralgias and gait problem.  Skin: Negative for color change, pallor and rash.  Neurological: Negative for dizziness, light-headedness, numbness  + migraine today Hematological: Negative for adenopathy. Does not bruise/bleed easily.  Psychiatric/Behavioral: Negative for suicidal ideas, confusion, sleep disturbance, self-injury, dysphoric mood, decreased concentration and agitation.  Pt is able to read and write and can do all ADLs No risk for falling No abuse/ violence in home           Objective:     Vitals: BP 116/84 mmHg  Pulse 69  Temp(Src) 97.9 F (36.6 C) (Oral)  Ht 5\' 2"  (1.575 m)  Wt 187 lb 9.6 oz (85.095 kg)  BMI 34.30 kg/m2  SpO2 98%  Body mass index is 34.3 kg/(m^2).  BP 116/84 mmHg  Pulse 69  Temp(Src) 97.9 F (36.6 C) (Oral)  Ht 5\' 2"  (1.575 m)  Wt 187 lb 9.6 oz (85.095 kg)  BMI 34.30 kg/m2  SpO2  98% General appearance: alert, cooperative, appears stated age and no distress Head: Normocephalic, without obvious abnormality, atraumatic Eyes: negative findings: lids and lashes normal and pupils equal, round, reactive to light and accomodation Neck: no adenopathy, supple, symmetrical, trachea midline and thyroid not enlarged, symmetric, no tenderness/mass/nodules Lungs: clear to auscultation bilaterally Heart: S1, S2 normal Extremities: extremities normal, atraumatic, no cyanosis or edema Neurologic: Alert and oriented X 3, normal strength and tone. Normal symmetric reflexes. Normal coordination and gait  Tobacco History  Smoking status  . Never Smoker   Smokeless tobacco  . Never Used     Counseling given: Not Answered   Past Medical History  Diagnosis Date  . Migraines   . Hypertension   . Diabetes mellitus without complication ([place])     Diet controlled  . Depression with anxiety   . Panic attacks   . Cancer ([place])   . Depression   . CHF (congestive heart failure) ([place])     dystolic heart dysfunction  . Chronic kidney disease    Past Surgical History  Procedure Laterality Date  . Total abdominal hysterectomy w/ bilateral salpingoophorectomy      for fibroids and migraines  . Tonsillectomy and adenoidectomy    . Nasal septum surgery    . Tubal ligation    . Dilation and curettage of uterus    . Papaloma Right   . Breast surgery    . Abdominal hysterectomy     Family History  Problem Relation Age of Onset  . Crohn's  disease Mother   . Atrial fibrillation Mother   . Heart failure Mother   . Bladder Cancer Father   . Prostate cancer Father   . Bipolar disorder Sister   . Bipolar disorder Brother   . Heart attack Maternal Grandmother     >65  . Diabetes Neg Hx   . Stroke Neg Hx    History  Sexual Activity  . Sexual Activity:  . Partners: Male    Outpatient Encounter Prescriptions as of 04/24/2016  Medication Sig  . ALPRAZolam (XANAX) 1 MG tablet Take  1 mg by mouth at bedtime as needed for sleep.  Marland Kitchen aspirin EC 81 MG tablet Take 1 tablet (81 mg total) by mouth daily.  . benzoyl peroxide-erythromycin (BENZAMYCIN) gel Apply topically 2 (two) times daily as needed.  Marland Kitchen estradiol (ESTRACE) 0.5 MG tablet Take 1 tablet by mouth daily.  . fluocinonide gel (LIDEX) [id number] % Apply 1 application topically daily.  Marland Kitchen FLUoxetine (PROZAC) 20 MG capsule Take 20 mg by mouth daily.  . furosemide (LASIX) 20 MG tablet Take 1 tablet (20 mg total) by mouth daily.  Marland Kitchen ketorolac (TORADOL) 10 MG tablet Take 1 tablet (10 mg total) by mouth every 6 (six) hours as needed.  Marland Kitchen losartan (COZAAR) 50 MG tablet Take 1 tablet (50 mg total) by mouth daily.  . nitroGLYCERIN (NITROSTAT) 0.4 MG SL tablet Place 1 tablet (0.4 mg total) under the tongue every 5 (five) minutes as needed for chest pain.  . promethazine (PHENERGAN) 25 MG tablet Take 25 mg by mouth as needed.  . topiramate (TOPAMAX) 50 MG tablet TAKE ONE TABLET BY MOUTH IN THE MORNING AND TWO IN THE EVENING  . triamcinolone ointment (KENALOG) 0.1 % Apply topically as needed   No facility-administered encounter medications on file as of 04/24/2016.    Activities of Daily Living In your present state of health, do you have any difficulty performing the following activities: 12/26/2015  Hearing? N  Vision? N  Difficulty concentrating or making decisions? Y  Walking or climbing stairs? N  Dressing or bathing? N  Doing errands, shopping? N    Patient Care Team: [name], DO as PCP - General (Family Medicine) [name], DO as Consulting Physician (Family Medicine) Janann August, MD as Referring Physician (Dermatology) Tish Men, PA-C as Consulting Physician (Dermatology) Aloha Gell, MD as Consulting Physician (Obstetrics and Gynecology) Pieter Partridge, DO as Consulting Physician (Neurology)    Assessment:    cpe Exercise Activities and Dietary recommendations---- none    Goals     None     Fall Risk Fall Risk  04/24/2016 04/22/2016  Falls in the past year? No No   Depression Screen PHQ 2/9 Scores 04/24/2016 08/26/2015 08/26/2015  PHQ - 2 Score 6 6 6   PHQ- 9 Score 14 23 14      Cognitive Testing mmse 30/30  Immunization History  Administered Date(s) Administered  . Influenza Split 09/19/2012  . Influenza Whole 11/08/2009  . Influenza,inj,Quad PF,36+ Mos 11/07/2013  . Influenza-Unspecified 09/11/2015  . Td 11/08/2009   Screening Tests Health Maintenance  Topic Date Due  . Hepatitis C Screening  Mar 20, 1951  . HIV Screening  09/28/1968  . ZOSTAVAX  09/28/2013  . INFLUENZA VACCINE  07/07/2016  . MAMMOGRAM  09/10/2017  . TETANUS/TDAP  11/09/2019  . COLONOSCOPY  12/13/2019      Plan:    see AVS During the course of the visit the patient was educated and counseled about the  following appropriate screening and preventive services:   Vaccines to include Pneumoccal, Influenza, Hepatitis B, Td, Zostavax, HCV  Electrocardiogram  Cardiovascular Disease  Colorectal cancer screening  Bone density screening  Diabetes screening  Glaucoma screening  Mammography/PAP  Nutrition counseling   Patient Instructions (the written plan) was given to the patient.  1. Essential hypertension con't losartan bp stable - Comprehensive metabolic panel - CBC with Differential/Platelet - Lipid panel - POCT urinalysis dipstick  2. Hyperlipidemia Check labs - Comprehensive metabolic panel - CBC with Differential/Platelet - Lipid panel - POCT urinalysis dipstick  3. Medicare annual wellness visit, subsequent See above  [name], DO  04/24/2016

## 2016-04-24 NOTE — Patient Instructions (Addendum)
Preventive Care for Adults, Female A healthy lifestyle and preventive care can promote health and wellness. Preventive health guidelines for women include the following key practices.  A routine yearly physical is a good way to check with your health care provider about your health and preventive screening. It is a chance to share any concerns and updates on your health and to receive a thorough exam.  Visit your dentist for a routine exam and preventive care every 6 months. Brush your teeth twice a day and floss once a day. Good oral hygiene prevents tooth decay and gum disease.  The frequency of eye exams is based on your age, health, family medical history, use of contact lenses, and other factors. Follow your health care provider's recommendations for frequency of eye exams.  Eat a healthy diet. Foods like vegetables, fruits, whole grains, low-fat dairy products, and lean protein foods contain the nutrients you need without too many calories. Decrease your intake of foods high in solid fats, added sugars, and salt. Eat the right amount of calories for you.Get information about a proper diet from your health care provider, if necessary.  Regular physical exercise is one of the most important things you can do for your health. Most adults should get at least 150 minutes of moderate-intensity exercise (any activity that increases your heart rate and causes you to sweat) each week. In addition, most adults need muscle-strengthening exercises on 2 or more days a week.  Maintain a healthy weight. The body mass index (BMI) is a screening tool to identify possible weight problems. It provides an estimate of body fat based on height and weight. Your health care provider can find your BMI and can help you achieve or maintain a healthy weight.For adults 20 years and older:  A BMI below 18.5 is considered underweight.  A BMI of 18.5 to 24.9 is normal.  A BMI of 25 to 29.9 is considered overweight.  A  BMI of 30 and above is considered obese.  Maintain normal blood lipids and cholesterol levels by exercising and minimizing your intake of saturated fat. Eat a balanced diet with plenty of fruit and vegetables. Blood tests for lipids and cholesterol should begin at age 45 and be repeated every 5 years. If your lipid or cholesterol levels are high, you are over 50, or you are at high risk for heart disease, you may need your cholesterol levels checked more frequently.Ongoing high lipid and cholesterol levels should be treated with medicines if diet and exercise are not working.  If you smoke, find out from your health care provider how to quit. If you do not use tobacco, do not start.  Lung cancer screening is recommended for adults aged 45-80 years who are at high risk for developing lung cancer because of a history of smoking. A yearly low-dose CT scan of the lungs is recommended for people who have at least a 30-pack-year history of smoking and are a current smoker or have quit within the past 15 years. A pack year of smoking is smoking an average of 1 pack of cigarettes a day for 1 year (for example: 1 pack a day for 30 years or 2 packs a day for 15 years). Yearly screening should continue until the smoker has stopped smoking for at least 15 years. Yearly screening should be stopped for people who develop a health problem that would prevent them from having lung cancer treatment.  If you are pregnant, do not drink alcohol. If you are  breastfeeding, be very cautious about drinking alcohol. If you are not pregnant and choose to drink alcohol, do not have more than 1 drink per day. One drink is considered to be 12 ounces (355 mL) of beer, 5 ounces (148 mL) of wine, or 1.5 ounces (44 mL) of liquor.  Avoid use of street drugs. Do not share needles with anyone. Ask for help if you need support or instructions about stopping the use of drugs.  High blood pressure causes heart disease and increases the risk  of stroke. Your blood pressure should be checked at least every 1 to 2 years. Ongoing high blood pressure should be treated with medicines if weight loss and exercise do not work.  If you are 55-79 years old, ask your health care provider if you should take aspirin to prevent strokes.  Diabetes screening is done by taking a blood sample to check your blood glucose level after you have not eaten for a certain period of time (fasting). If you are not overweight and you do not have risk factors for diabetes, you should be screened once every 3 years starting at age 45. If you are overweight or obese and you are 40-70 years of age, you should be screened for diabetes every year as part of your cardiovascular risk assessment.  Breast cancer screening is essential preventive care for women. You should practice "breast self-awareness." This means understanding the normal appearance and feel of your breasts and may include breast self-examination. Any changes detected, no matter how small, should be reported to a health care provider. Women in their 20s and 30s should have a clinical breast exam (CBE) by a health care provider as part of a regular health exam every 1 to 3 years. After age 40, women should have a CBE every year. Starting at age 40, women should consider having a mammogram (breast X-ray test) every year. Women who have a family history of breast cancer should talk to their health care provider about genetic screening. Women at a high risk of breast cancer should talk to their health care providers about having an MRI and a mammogram every year.  Breast cancer gene (BRCA)-related cancer risk assessment is recommended for women who have family members with BRCA-related cancers. BRCA-related cancers include breast, ovarian, tubal, and peritoneal cancers. Having family members with these cancers may be associated with an increased risk for harmful changes (mutations) in the breast cancer genes BRCA1 and  BRCA2. Results of the assessment will determine the need for genetic counseling and BRCA1 and BRCA2 testing.  Your health care provider may recommend that you be screened regularly for cancer of the pelvic organs (ovaries, uterus, and vagina). This screening involves a pelvic examination, including checking for microscopic changes to the surface of your cervix (Pap test). You may be encouraged to have this screening done every 3 years, beginning at age 21.  For women ages 30-65, health care providers may recommend pelvic exams and Pap testing every 3 years, or they may recommend the Pap and pelvic exam, combined with testing for human papilloma virus (HPV), every 5 years. Some types of HPV increase your risk of cervical cancer. Testing for HPV may also be done on women of any age with unclear Pap test results.  Other health care providers may not recommend any screening for nonpregnant women who are considered low risk for pelvic cancer and who do not have symptoms. Ask your health care provider if a screening pelvic exam is right for   you.  If you have had past treatment for cervical cancer or a condition that could lead to cancer, you need Pap tests and screening for cancer for at least 20 years after your treatment. If Pap tests have been discontinued, your risk factors (such as having a new sexual partner) need to be reassessed to determine if screening should resume. Some women have medical problems that increase the chance of getting cervical cancer. In these cases, your health care provider may recommend more frequent screening and Pap tests.  Colorectal cancer can be detected and often prevented. Most routine colorectal cancer screening begins at the age of 50 years and continues through age 75 years. However, your health care provider may recommend screening at an earlier age if you have risk factors for colon cancer. On a yearly basis, your health care provider may provide home test kits to check  for hidden blood in the stool. Use of a small camera at the end of a tube, to directly examine the colon (sigmoidoscopy or colonoscopy), can detect the earliest forms of colorectal cancer. Talk to your health care provider about this at age 50, when routine screening begins. Direct exam of the colon should be repeated every 5-10 years through age 75 years, unless early forms of precancerous polyps or small growths are found.  People who are at an increased risk for hepatitis B should be screened for this virus. You are considered at high risk for hepatitis B if:  You were born in a country where hepatitis B occurs often. Talk with your health care provider about which countries are considered high risk.  Your parents were born in a high-risk country and you have not received a shot to protect against hepatitis B (hepatitis B vaccine).  You have HIV or AIDS.  You use needles to inject street drugs.  You live with, or have sex with, someone who has hepatitis B.  You get hemodialysis treatment.  You take certain medicines for conditions like cancer, organ transplantation, and autoimmune conditions.  Hepatitis C blood testing is recommended for all people born from 1945 through 1965 and any individual with known risks for hepatitis C.  Practice safe sex. Use condoms and avoid high-risk sexual practices to reduce the spread of sexually transmitted infections (STIs). STIs include gonorrhea, chlamydia, syphilis, trichomonas, herpes, HPV, and human immunodeficiency virus (HIV). Herpes, HIV, and HPV are viral illnesses that have no cure. They can result in disability, cancer, and death.  You should be screened for sexually transmitted illnesses (STIs) including gonorrhea and chlamydia if:  You are sexually active and are younger than 24 years.  You are older than 24 years and your health care provider tells you that you are at risk for this type of infection.  Your sexual activity has changed  since you were last screened and you are at an increased risk for chlamydia or gonorrhea. Ask your health care provider if you are at risk.  If you are at risk of being infected with HIV, it is recommended that you take a prescription medicine daily to prevent HIV infection. This is called preexposure prophylaxis (PrEP). You are considered at risk if:  You are sexually active and do not regularly use condoms or know the HIV status of your partner(s).  You take drugs by injection.  You are sexually active with a partner who has HIV.  Talk with your health care provider about whether you are at high risk of being infected with HIV. If   you choose to begin PrEP, you should first be tested for HIV. You should then be tested every 3 months for as long as you are taking PrEP.  Osteoporosis is a disease in which the bones lose minerals and strength with aging. This can result in serious bone fractures or breaks. The risk of osteoporosis can be identified using a bone density scan. Women ages 67 years and over and women at risk for fractures or osteoporosis should discuss screening with their health care providers. Ask your health care provider whether you should take a calcium supplement or vitamin D to reduce the rate of osteoporosis.  Menopause can be associated with physical symptoms and risks. Hormone replacement therapy is available to decrease symptoms and risks. You should talk to your health care provider about whether hormone replacement therapy is right for you.  Use sunscreen. Apply sunscreen liberally and repeatedly throughout the day. You should seek shade when your shadow is shorter than you. Protect yourself by wearing long sleeves, pants, a wide-brimmed hat, and sunglasses year round, whenever you are outdoors.  Once a month, do a whole body skin exam, using a mirror to look at the skin on your back. Tell your health care provider of new moles, moles that have irregular borders, moles that  are larger than a pencil eraser, or moles that have changed in shape or color.  Stay current with required vaccines (immunizations).  Influenza vaccine. All adults should be immunized every year.  Tetanus, diphtheria, and acellular pertussis (Td, Tdap) vaccine. Pregnant women should receive 1 dose of Tdap vaccine during each pregnancy. The dose should be obtained regardless of the length of time since the last dose. Immunization is preferred during the 27th-36th week of gestation. An adult who has not previously received Tdap or who does not know her vaccine status should receive 1 dose of Tdap. This initial dose should be followed by tetanus and diphtheria toxoids (Td) booster doses every 10 years. Adults with an unknown or incomplete history of completing a 3-dose immunization series with Td-containing vaccines should begin or complete a primary immunization series including a Tdap dose. Adults should receive a Td booster every 10 years.  Varicella vaccine. An adult without evidence of immunity to varicella should receive 2 doses or a second dose if she has previously received 1 dose. Pregnant females who do not have evidence of immunity should receive the first dose after pregnancy. This first dose should be obtained before leaving the health care facility. The second dose should be obtained 4-8 weeks after the first dose.  Human papillomavirus (HPV) vaccine. Females aged 13-26 years who have not received the vaccine previously should obtain the 3-dose series. The vaccine is not recommended for use in pregnant females. However, pregnancy testing is not needed before receiving a dose. If a female is found to be pregnant after receiving a dose, no treatment is needed. In that case, the remaining doses should be delayed until after the pregnancy. Immunization is recommended for any person with an immunocompromised condition through the age of 61 years if she did not get any or all doses earlier. During the  3-dose series, the second dose should be obtained 4-8 weeks after the first dose. The third dose should be obtained 24 weeks after the first dose and 16 weeks after the second dose.  Zoster vaccine. One dose is recommended for adults aged 30 years or older unless certain conditions are present.  Measles, mumps, and rubella (MMR) vaccine. Adults born  before 1957 generally are considered immune to measles and mumps. Adults born in 1957 or later should have 1 or more doses of MMR vaccine unless there is a contraindication to the vaccine or there is laboratory evidence of immunity to each of the three diseases. A routine second dose of MMR vaccine should be obtained at least 28 days after the first dose for students attending postsecondary schools, health care workers, or international travelers. People who received inactivated measles vaccine or an unknown type of measles vaccine during 1963-1967 should receive 2 doses of MMR vaccine. People who received inactivated mumps vaccine or an unknown type of mumps vaccine before 1979 and are at high risk for mumps infection should consider immunization with 2 doses of MMR vaccine. For females of childbearing age, rubella immunity should be determined. If there is no evidence of immunity, females who are not pregnant should be vaccinated. If there is no evidence of immunity, females who are pregnant should delay immunization until after pregnancy. Unvaccinated health care workers born before 1957 who lack laboratory evidence of measles, mumps, or rubella immunity or laboratory confirmation of disease should consider measles and mumps immunization with 2 doses of MMR vaccine or rubella immunization with 1 dose of MMR vaccine.  Pneumococcal 13-valent conjugate (PCV13) vaccine. When indicated, a person who is uncertain of his immunization history and has no record of immunization should receive the PCV13 vaccine. All adults 65 years of age and older should receive this  vaccine. An adult aged 19 years or older who has certain medical conditions and has not been previously immunized should receive 1 dose of PCV13 vaccine. This PCV13 should be followed with a dose of pneumococcal polysaccharide (PPSV23) vaccine. Adults who are at high risk for pneumococcal disease should obtain the PPSV23 vaccine at least 8 weeks after the dose of PCV13 vaccine. Adults older than 63 years of age who have normal immune system function should obtain the PPSV23 vaccine dose at least 1 year after the dose of PCV13 vaccine.  Pneumococcal polysaccharide (PPSV23) vaccine. When PCV13 is also indicated, PCV13 should be obtained first. All adults aged 65 years and older should be immunized. An adult younger than age 65 years who has certain medical conditions should be immunized. Any person who resides in a nursing home or long-term care facility should be immunized. An adult smoker should be immunized. People with an immunocompromised condition and certain other conditions should receive both PCV13 and PPSV23 vaccines. People with human immunodeficiency virus (HIV) infection should be immunized as soon as possible after diagnosis. Immunization during chemotherapy or radiation therapy should be avoided. Routine use of PPSV23 vaccine is not recommended for American Indians, [place] Natives, or people younger than 65 years unless there are medical conditions that require PPSV23 vaccine. When indicated, people who have unknown immunization and have no record of immunization should receive PPSV23 vaccine. One-time revaccination 5 years after the first dose of PPSV23 is recommended for people aged 19-64 years who have chronic kidney failure, nephrotic syndrome, asplenia, or immunocompromised conditions. People who received 1-2 doses of PPSV23 before age 65 years should receive another dose of PPSV23 vaccine at age 65 years or later if at least 5 years have passed since the previous dose. Doses of PPSV23 are not  needed for people immunized with PPSV23 at or after age 65 years.  Meningococcal vaccine. Adults with asplenia or persistent complement component deficiencies should receive 2 doses of quadrivalent meningococcal conjugate (MenACWY-D) vaccine. The doses should be obtained   at least 2 months apart. Microbiologists working with certain meningococcal bacteria, [place] recruits, people at risk during an outbreak, and people who travel to or live in countries with a high rate of meningitis should be immunized. A first-year college student up through age 34 years who is living in a residence hall should receive a dose if she did not receive a dose on or after her 16th birthday. Adults who have certain high-risk conditions should receive one or more doses of vaccine.  Hepatitis A vaccine. Adults who wish to be protected from this disease, have certain high-risk conditions, work with hepatitis A-infected animals, work in hepatitis A research labs, or travel to or work in countries with a high rate of hepatitis A should be immunized. Adults who were previously unvaccinated and who anticipate close contact with an international adoptee during the first 60 days after arrival in the [place] from a country with a high rate of hepatitis A should be immunized.  Hepatitis B vaccine. Adults who wish to be protected from this disease, have certain high-risk conditions, may be exposed to blood or other infectious body fluids, are household contacts or sex partners of hepatitis B positive people, are clients or workers in certain care facilities, or travel to or work in countries with a high rate of hepatitis B should be immunized.  Haemophilus influenzae type b (Hib) vaccine. A previously unvaccinated person with asplenia or sickle cell disease or having a scheduled splenectomy should receive 1 dose of Hib vaccine. Regardless of previous immunization, a recipient of a hematopoietic stem cell transplant should receive a  3-dose series 6-12 months after her successful transplant. Hib vaccine is not recommended for adults with HIV infection. Preventive Services / Frequency Ages 35 to 4 years  Blood pressure check.** / Every 3-5 years.  Lipid and cholesterol check.** / Every 5 years beginning at age 60.  Clinical breast exam.** / Every 3 years for women in their 71s and 10s.  BRCA-related cancer risk assessment.** / For women who have family members with a BRCA-related cancer (breast, ovarian, tubal, or peritoneal cancers).  Pap test.** / Every 2 years from ages 76 through 26. Every 3 years starting at age 61 through age 76 or 93 with a history of 3 consecutive normal Pap tests.  HPV screening.** / Every 3 years from ages 37 through ages 60 to 51 with a history of 3 consecutive normal Pap tests.  Hepatitis C blood test.** / For any individual with known risks for hepatitis C.  Skin self-exam. / Monthly.  Influenza vaccine. / Every year.  Tetanus, diphtheria, and acellular pertussis (Tdap, Td) vaccine.** / Consult your health care provider. Pregnant women should receive 1 dose of Tdap vaccine during each pregnancy. 1 dose of Td every 10 years.  Varicella vaccine.** / Consult your health care provider. Pregnant females who do not have evidence of immunity should receive the first dose after pregnancy.  HPV vaccine. / 3 doses over 6 months, if 93 and younger. The vaccine is not recommended for use in pregnant females. However, pregnancy testing is not needed before receiving a dose.  Measles, mumps, rubella (MMR) vaccine.** / You need at least 1 dose of MMR if you were born in 1957 or later. You may also need a 2nd dose. For females of childbearing age, rubella immunity should be determined. If there is no evidence of immunity, females who are not pregnant should be vaccinated. If there is no evidence of immunity, females who are  pregnant should delay immunization until after pregnancy.  Pneumococcal  13-valent conjugate (PCV13) vaccine.** / Consult your health care provider.  Pneumococcal polysaccharide (PPSV23) vaccine.** / 1 to 2 doses if you smoke cigarettes or if you have certain conditions.  Meningococcal vaccine.** / 1 dose if you are age 68 to 8 years and a Market researcher living in a residence hall, or have one of several medical conditions, you need to get vaccinated against meningococcal disease. You may also need additional booster doses.  Hepatitis A vaccine.** / Consult your health care provider.  Hepatitis B vaccine.** / Consult your health care provider.  Haemophilus influenzae type b (Hib) vaccine.** / Consult your health care provider. Ages 7 to 53 years  Blood pressure check.** / Every year.  Lipid and cholesterol check.** / Every 5 years beginning at age 25 years.  Lung cancer screening. / Every year if you are aged 11-80 years and have a 30-pack-year history of smoking and currently smoke or have quit within the past 15 years. Yearly screening is stopped once you have quit smoking for at least 15 years or develop a health problem that would prevent you from having lung cancer treatment.  Clinical breast exam.** / Every year after age 48 years.  BRCA-related cancer risk assessment.** / For women who have family members with a BRCA-related cancer (breast, ovarian, tubal, or peritoneal cancers).  Mammogram.** / Every year beginning at age 41 years and continuing for as long as you are in good health. Consult with your health care provider.  Pap test.** / Every 3 years starting at age 65 years through age 37 or 70 years with a history of 3 consecutive normal Pap tests.  HPV screening.** / Every 3 years from ages 72 years through ages 60 to 40 years with a history of 3 consecutive normal Pap tests.  Fecal occult blood test (FOBT) of stool. / Every year beginning at age 21 years and continuing until age 5 years. You may not need to do this test if you get  a colonoscopy every 10 years.  Flexible sigmoidoscopy or colonoscopy.** / Every 5 years for a flexible sigmoidoscopy or every 10 years for a colonoscopy beginning at age 35 years and continuing until age 48 years.  Hepatitis C blood test.** / For all people born from [birth year] through 1965 and any individual with known risks for hepatitis C.  Skin self-exam. / Monthly.  Influenza vaccine. / Every year.  Tetanus, diphtheria, and acellular pertussis (Tdap/Td) vaccine.** / Consult your health care provider. Pregnant women should receive 1 dose of Tdap vaccine during each pregnancy. 1 dose of Td every 10 years.  Varicella vaccine.** / Consult your health care provider. Pregnant females who do not have evidence of immunity should receive the first dose after pregnancy.  Zoster vaccine.** / 1 dose for adults aged 30 years or older.  Measles, mumps, rubella (MMR) vaccine.** / You need at least 1 dose of MMR if you were born in 1957 or later. You may also need a second dose. For females of childbearing age, rubella immunity should be determined. If there is no evidence of immunity, females who are not pregnant should be vaccinated. If there is no evidence of immunity, females who are pregnant should delay immunization until after pregnancy.  Pneumococcal 13-valent conjugate (PCV13) vaccine.** / Consult your health care provider.  Pneumococcal polysaccharide (PPSV23) vaccine.** / 1 to 2 doses if you smoke cigarettes or if you have certain conditions.  Meningococcal vaccine.** /  Consult your health care provider.  Hepatitis A vaccine.** / Consult your health care provider.  Hepatitis B vaccine.** / Consult your health care provider.  Haemophilus influenzae type b (Hib) vaccine.** / Consult your health care provider. Ages 11 years and over  Blood pressure check.** / Every year.  Lipid and cholesterol check.** / Every 5 years beginning at age 69 years.  Lung cancer screening. / Every year if you  are aged 56-80 years and have a 30-pack-year history of smoking and currently smoke or have quit within the past 15 years. Yearly screening is stopped once you have quit smoking for at least 15 years or develop a health problem that would prevent you from having lung cancer treatment.  Clinical breast exam.** / Every year after age 42 years.  BRCA-related cancer risk assessment.** / For women who have family members with a BRCA-related cancer (breast, ovarian, tubal, or peritoneal cancers).  Mammogram.** / Every year beginning at age 53 years and continuing for as long as you are in good health. Consult with your health care provider.  Pap test.** / Every 3 years starting at age 39 years through age 44 or 52 years with 3 consecutive normal Pap tests. Testing can be stopped between 65 and 70 years with 3 consecutive normal Pap tests and no abnormal Pap or HPV tests in the past 10 years.  HPV screening.** / Every 3 years from ages 79 years through ages 74 or 40 years with a history of 3 consecutive normal Pap tests. Testing can be stopped between 65 and 70 years with 3 consecutive normal Pap tests and no abnormal Pap or HPV tests in the past 10 years.  Fecal occult blood test (FOBT) of stool. / Every year beginning at age 69 years and continuing until age 62 years. You may not need to do this test if you get a colonoscopy every 10 years.  Flexible sigmoidoscopy or colonoscopy.** / Every 5 years for a flexible sigmoidoscopy or every 10 years for a colonoscopy beginning at age 25 years and continuing until age 14 years.  Hepatitis C blood test.** / For all people born from [birth year] through 1963 and any individual with known risks for hepatitis C.  Osteoporosis screening.** / A one-time screening for women ages 33 years and over and women at risk for fractures or osteoporosis.  Skin self-exam. / Monthly.  Influenza vaccine. / Every year.  Tetanus, diphtheria, and acellular pertussis (Tdap/Td)  vaccine.** / 1 dose of Td every 10 years.  Varicella vaccine.** / Consult your health care provider.  Zoster vaccine.** / 1 dose for adults aged 63 years or older.  Pneumococcal 13-valent conjugate (PCV13) vaccine.** / Consult your health care provider.  Pneumococcal polysaccharide (PPSV23) vaccine.** / 1 dose for all adults aged 3 years and older.  Meningococcal vaccine.** / Consult your health care provider.  Hepatitis A vaccine.** / Consult your health care provider.  Hepatitis B vaccine.** / Consult your health care provider.  Haemophilus influenzae type b (Hib) vaccine.** / Consult your health care provider. ** Family history and personal history of risk and conditions may change your health care provider's recommendations.   This information is not intended to replace advice given to you by your health care provider. Make sure you discuss any questions you have with your health care provider.   Document Released: 01/19/2002 Document Revised: 12/14/2014 Document Reviewed: 04/20/2011 Elsevier Interactive Patient Education [employer]. [id number]-------------------------[id number].

## 2016-04-24 NOTE — Progress Notes (Signed)
Pre visit review using our clinic review tool, if applicable. No additional management support is needed unless otherwise documented below in the visit note. 

## 2016-04-27 ENCOUNTER — Other Ambulatory Visit: Payer: Medicare Other

## 2016-04-27 ENCOUNTER — Telehealth: Payer: Self-pay | Admitting: *Deleted

## 2016-04-27 LAB — POCT URINALYSIS DIPSTICK
Bilirubin, UA: NEGATIVE
Blood, UA: NEGATIVE
Glucose, UA: NEGATIVE
Ketones, UA: NEGATIVE
Leukocytes, UA: NEGATIVE
Nitrite, UA: NEGATIVE
Protein, UA: NEGATIVE
Spec Grav, UA: 1.015
Urobilinogen, UA: 0.2
pH, UA: 7

## 2016-04-27 NOTE — Telephone Encounter (Signed)
Per Dr. Carollee Herter, okay to take Belly Trim XP supplement. Called pt and LMOM that she can take supplement and that she can pick up her bottle in the office at her convenience.

## 2016-04-30 NOTE — Telephone Encounter (Addendum)
Called pt and notified her that supplement is ready for pick-up. She states she will try to come pick it up after Memorial Day weekend. She is very appreciative of Dr. Megan Mans help.

## 2016-05-07 ENCOUNTER — Encounter: Payer: Self-pay | Admitting: Neurology

## 2016-05-13 DIAGNOSIS — F330 Major depressive disorder, recurrent, mild: Secondary | ICD-10-CM | POA: Diagnosis not present

## 2016-06-07 ENCOUNTER — Encounter: Payer: Self-pay | Admitting: Neurology

## 2016-06-14 ENCOUNTER — Encounter: Payer: Self-pay | Admitting: Neurology

## 2016-06-15 ENCOUNTER — Other Ambulatory Visit: Payer: Self-pay

## 2016-06-15 MED ORDER — ZONISAMIDE 50 MG PO CAPS
ORAL_CAPSULE | ORAL | Status: DC
Start: 2016-06-15 — End: 2016-08-04

## 2016-06-22 ENCOUNTER — Encounter: Payer: Self-pay | Admitting: Neurology

## 2016-06-22 NOTE — Telephone Encounter (Signed)
Please see pt's my chart message

## 2016-07-02 ENCOUNTER — Other Ambulatory Visit: Payer: Self-pay | Admitting: Family Medicine

## 2016-07-02 DIAGNOSIS — I5189 Other ill-defined heart diseases: Secondary | ICD-10-CM

## 2016-07-02 DIAGNOSIS — F333 Major depressive disorder, recurrent, severe with psychotic symptoms: Secondary | ICD-10-CM | POA: Diagnosis not present

## 2016-07-06 ENCOUNTER — Encounter: Payer: Self-pay | Admitting: Neurology

## 2016-07-06 NOTE — Telephone Encounter (Signed)
Please see mychart message from patient.

## 2016-07-07 ENCOUNTER — Encounter: Payer: Self-pay | Admitting: Neurology

## 2016-07-07 MED ORDER — GABAPENTIN 300 MG PO CAPS: ORAL_CAPSULE | ORAL | 2 refills | Status: DC

## 2016-07-07 NOTE — Telephone Encounter (Signed)
Please see mychart message from patient. Yesterday's message pt started with FYI. Please advise.

## 2016-07-07 NOTE — Telephone Encounter (Unsigned)
Per note from Dr. Tomi Likens, " She may stop zonisamide and start gabapentin 300mg  at bedtime for 7 days, then 300mg  twice daily.    As for her other symptoms, they do not sound neurologic and there is no indication to get an MRI.  "   RX sent to pharmacy. Mychart reply sent to patient. Will call if no reply by 9 a.m.

## 2016-07-10 ENCOUNTER — Other Ambulatory Visit: Payer: Self-pay | Admitting: Family Medicine

## 2016-07-10 DIAGNOSIS — I10 Essential (primary) hypertension: Secondary | ICD-10-CM

## 2016-07-30 ENCOUNTER — Ambulatory Visit (INDEPENDENT_AMBULATORY_CARE_PROVIDER_SITE_OTHER): Payer: Medicare Other | Admitting: Family Medicine

## 2016-07-30 VITALS — BP 136/92 | HR 81 | Temp 98.3°F | Resp 18 | Ht 62.0 in | Wt 188.6 lb

## 2016-07-30 DIAGNOSIS — Z111 Encounter for screening for respiratory tuberculosis: Secondary | ICD-10-CM | POA: Diagnosis not present

## 2016-07-30 DIAGNOSIS — R002 Palpitations: Secondary | ICD-10-CM

## 2016-07-30 DIAGNOSIS — Z23 Encounter for immunization: Secondary | ICD-10-CM | POA: Diagnosis not present

## 2016-07-30 NOTE — Patient Instructions (Addendum)
Make sure to make an appt to see your cardiologist within the next 4 weeks.  If your palpitations get worse, you start having chest pain or trouble breathing, go straight to the ED.  It was good to meet you today.  Hang in there!    IF you received an x-ray today, you will receive an invoice from [facility]. Please contact [facility] at [phone] with questions or concerns regarding your invoice.   IF you received labwork today, you will receive an invoice from [employer]. Please contact [facility] at [phone] with questions or concerns regarding your invoice.   Our billing staff will not be able to assist you with questions regarding bills from these companies.  You will be contacted with the lab results as soon as they are available. The fastest way to get your results is to activate your My Chart account. Instructions are located on the last page of this paperwork. If you have not heard from [place] regarding the results in 2 weeks, please contact this office.

## 2016-07-30 NOTE — Progress Notes (Signed)
Ebony Garcia is a 65 y.o. female who presents to Urgent Care today for vaccines:  1.  Vaccinations for work:  Patient just started a new job.  She requires hep B, TB skin test, and flu shots for work  She has never had a reaction to any prior vaccines. NO allergy to eggs.  Gets flu shots every year.    2.  Palpitations:  Has been undergoing fair amount of grief this year.  Father with Stage IV bladder CA, mother with dementia, brother recently died this year, and she is not speaking with her sister or any of her children.  States she feels palpitations as "fluttering or pounding" in her chest.  This started this year.  Occurs almost daily.  States she is sitting watching TV when it happens.  Not associated with exertion.  No chest pain. No DOE or SOB when it occurs. Resolves spontaneously.  No diaphoresis.    Also reports daily crying spells, which sometimes triggers palpitations.  She is being seen regularly by her psychiatrist    PMH reviewed. Patient is a nonsmoker.   Past Medical History:  Diagnosis Date  . Cancer ([place])   . CHF (congestive heart failure) ([place])    dystolic heart dysfunction  . Chronic kidney disease   . Depression   . Depression with anxiety   . Diabetes mellitus without complication ([place])    Diet controlled  . Hypertension   . Migraines   . Panic attacks    Past Surgical History:  Procedure Laterality Date  . ABDOMINAL HYSTERECTOMY    . BREAST SURGERY    . DILATION AND CURETTAGE OF UTERUS    . NASAL SEPTUM SURGERY    . papaloma Right   . TONSILLECTOMY AND ADENOIDECTOMY    . TOTAL ABDOMINAL HYSTERECTOMY W/ BILATERAL SALPINGOOPHORECTOMY     for fibroids and migraines  . TUBAL LIGATION      Medications reviewed. Current Outpatient Prescriptions  Medication Sig Dispense Refill  . ALPRAZolam (XANAX) 1 MG tablet Take 1 mg by mouth at bedtime as needed for sleep.    Marland Kitchen aspirin EC 81 MG tablet Take 1 tablet (81 mg total) by mouth daily. 90 tablet 3  .  benzoyl peroxide-erythromycin (BENZAMYCIN) gel Apply topically 2 (two) times daily as needed.    Marland Kitchen estradiol (ESTRACE) 0.5 MG tablet Take 1 tablet by mouth daily.    . fluocinonide gel (LIDEX) [id number] % Apply 1 application topically daily.    Marland Kitchen FLUoxetine (PROZAC) 20 MG capsule Take 20 mg by mouth daily.    . furosemide (LASIX) 20 MG tablet TAKE ONE TABLET BY MOUTH ONCE DAILY 90 tablet 0  . gabapentin (NEURONTIN) 300 MG capsule Take 1 capsule at bedtime for 7 days, then increase to 1 capsule twice daily. 60 capsule 2  . ketorolac (TORADOL) 10 MG tablet Take 1 tablet (10 mg total) by mouth every 6 (six) hours as needed. 30 tablet 0  . losartan (COZAAR) 50 MG tablet TAKE ONE TABLET BY MOUTH ONCE DAILY 90 tablet 1  . nitroGLYCERIN (NITROSTAT) 0.4 MG SL tablet Place 1 tablet (0.4 mg total) under the tongue every 5 (five) minutes as needed for chest pain. 25 tablet 3  . promethazine (PHENERGAN) 25 MG tablet Take 25 mg by mouth as needed.    . triamcinolone ointment (KENALOG) 0.1 % Apply topically as needed    . topiramate (TOPAMAX) 50 MG tablet TAKE ONE TABLET BY MOUTH IN THE MORNING AND TWO IN THE  EVENING (Patient not taking: Reported on 07/30/2016) 90 tablet 3  . zonisamide (ZONEGRAN) 50 MG capsule zonisamide 50 mg at bedtime x 7 days then increase to 100 mg at bedtime (Patient not taking: Reported on 07/30/2016) 60 capsule 1   No current facility-administered medications for this visit.      Physical Exam:  BP (!) 136/92 (BP Location: Right Arm, Patient Position: Sitting, Cuff Size: Large)   Pulse 81   Temp 98.3 F (36.8 C) (Oral)   Resp 18   Ht 5\' 2"  (1.575 m)   Wt 188 lb 9.6 oz (85.5 kg)   SpO2 96%   BMI 34.50 kg/m  Gen:  Alert, cooperative patient who appears stated age in no acute distress.  Vital signs reviewed. HEENT: EOMI,  MMM Pulm:  Clear to auscultation bilaterally with good air movement.  No wheezes or rales noted.   Cardiac:  Regular rate and rhythm without murmur auscultated.   Good S1/S2. Psych:  Linear and coherent thought process.  She did become teary at one moment when discussing her brother's recent death, but otherwise doing well.   Ext:  No LE edema.  Assessment and Plan:  1.  Vaccinations:  - provided here.  Return in 2 days for TB skin test.  No complications  2.  Palpitations:  - Recommended she FU with her cardoilogist for further evaluation within the next several weeks.  Not urgent as this have been going on for at least 8 months or so.  Regular rate and rhythm here.  - has diagnosis of diastolic CHF.  No evidence of fluid overload.  No current issues.  No evidence of exacerbation or worsening.  - possibility of SVT.  May need event monitor.

## 2016-07-30 NOTE — Progress Notes (Signed)
  Tuberculosis Risk Questionnaire  1. No Were you born outside the [place] in one of the following parts of the world: [place], [place], [place], [place] America or Eastern [place]?    2. Yes  Have you traveled outside the [place] and lived for more than one month in one of the following parts of the world: [place], [place], [place], [place] or Eastern [place]?    3. No Do you have a compromised immune system such as from any of the following conditions:HIV/AIDS, organ or bone marrow transplantation, diabetes, immunosuppressive medicines (e.g. Prednisone, Remicaide), leukemia, lymphoma, cancer of the head or neck, gastrectomy or jejunal bypass, end-stage renal disease (on dialysis), or silicosis?     4. Yes  Have you ever or do you plan on working in: a residential care center, a health care facility, a jail or prison or homeless shelter?    5. No Have you ever: injected illegal drugs, used crack cocaine, lived in a homeless shelter  or been in jail or prison?     6. No Have you ever been exposed to anyone with infectious tuberculosis?    Tuberculosis Symptom Questionnaire  Do you currently have any of the following symptoms?  1. No Unexplained cough lasting more than 3 weeks?   2. No Unexplained fever lasting more than 3 weeks.   3. No Night Sweats (sweating that leaves the bedclothes and sheets wet)     4. No Shortness of Breath   5. No Chest Pain   6. No Unintentional weight loss    7. No Unexplained fatigue (very tired for no reason)   

## 2016-07-31 ENCOUNTER — Telehealth: Payer: Self-pay | Admitting: Cardiology

## 2016-07-31 NOTE — Telephone Encounter (Addendum)
Received call from patient-pt states she needs to be seen ASAP d/t BP issues.  Reports BP has been running high.    BP readings:  "the other day"  154/99  8/25 170/100   no further readings to report.   Pt reports she was seen at [facility] yesterday for TB test and vaccinations and was told she needed to be seen for HTN in the next few weeks  Pt is still taking Losartan 50mg  daily.  Reports HA, sore throat, fatigue and doesn't feel like getting out of bed.   Last OV with Truitt Merle NP in 2016 f/u PRN.    Advised I would send to MD for further recommendations.

## 2016-07-31 NOTE — Telephone Encounter (Addendum)
Attempted to contact patient again to f/u, no answer.  Called mother [facility]) as requested per patient.  Reports patient is feeling achy, sore throat, HA, and fatigued with back pain and has turned her phone off to rest.  Referred to PCP for follow up.  Mother reports BP came down this AM after taking medications (unable to give me reading).  Advised we would follow up next week with MD recommendations and schedule f/u appt if needed.  Verbalized understanding and denies further questions/concerns.

## 2016-07-31 NOTE — Telephone Encounter (Signed)
New Message  Pts mom voiced pt is so sick pt turned phone off.  Pts mom voiced to call her phone for nurse to schedule an appt.  Please follow up with pt. Thanks!

## 2016-08-01 ENCOUNTER — Encounter: Payer: Self-pay | Admitting: *Deleted

## 2016-08-01 ENCOUNTER — Ambulatory Visit: Payer: Medicare Other | Admitting: Physician Assistant

## 2016-08-01 LAB — TB SKIN TEST
Induration: 0 mm
TB Skin Test: NEGATIVE

## 2016-08-02 NOTE — Telephone Encounter (Signed)
She needs to follow up with her PCP.  No further cardiac work up at this time.

## 2016-08-03 ENCOUNTER — Telehealth: Payer: Self-pay | Admitting: Family Medicine

## 2016-08-03 ENCOUNTER — Telehealth: Payer: Self-pay | Admitting: Cardiology

## 2016-08-03 NOTE — Telephone Encounter (Signed)
Appt scheduled as noted below.  Message routed to PCP just for FYI.

## 2016-08-03 NOTE — Telephone Encounter (Signed)
[facility] Day - Client TELEPHONE ADVICE RECORD [facility] Patient Name: Ebony Garcia DOB: 07/08/1951 Initial Comment Caller states her BP is high. Not sure what it's. Has been having chest pain for a few months. Pain in her left foot and left leg. Nurse Assessment Nurse: Dimas Chyle, RN, Dellis Filbert Date/Time Eilene Ghazi Time): 08/03/2016 12:38:28 PM Confirm and document reason for call. If symptomatic, describe symptoms. You must click the next button to save text entered. ---Caller states her BP is high. Not sure what it's. Has been having chest pain for a few months. Pain in her left foot and left leg. Also on gabapentin for migraines. Has the patient traveled out of the country within the last 30 days? ---No Does the patient have any new or worsening symptoms? ---Yes Will a triage be completed? ---Yes Related visit to physician within the last 2 weeks? ---No Does the PT have any chronic conditions? (i.e. diabetes, asthma, etc.) ---Yes List chronic conditions. ---HTN, migraines Is this a behavioral health or substance abuse call? ---No Guidelines Guideline Title Affirmed Question Affirmed Notes High Blood Pressure Wants doctor to measure BP Leg Pain [1] MODERATE pain (e.g., interferes with normal activities, limping) AND [2] present > 3 days Final Disposition User See PCP When Office is Open (within 3 days) Dimas Chyle, RN, [employer] reports that she has appointment with PCP for tomorrow at 3:30 p.m. Referrals REFERRED TO PCP OFFICE Disagree/Comply: Comply Disagree/Comply: Comply

## 2016-08-03 NOTE — Telephone Encounter (Signed)
New message     Pt c/o BP issue: STAT if pt c/o blurred vision, one-sided weakness or slurred speech  1. What are your last 5 BP readings? 8/27 last night 136/94/73, Friday morning 170/100, 159/86, 137/87, 134/87, 132/91 the last readings pt is not sure of dates.   2. Are you having any other symptoms (ex. Dizziness, headache, blurred vision, passed out)? dizziness, headaches   3. What is your BP issue? Weakness, pain in leg all the way down to her foot

## 2016-08-03 NOTE — Telephone Encounter (Signed)
Pt called in because she says that she was advised to call in to schedule by a different provider. Pt says that her BP has been really high . Scheduled pt to come in with PCP as soon as possible . ALSO transferred pt to [facility] for [place].

## 2016-08-03 NOTE — Telephone Encounter (Signed)
Returned call to patient Dr.Hochrein's recommendations given. 

## 2016-08-03 NOTE — Telephone Encounter (Signed)
See previous 08/03/16 note.

## 2016-08-04 ENCOUNTER — Encounter: Payer: Self-pay | Admitting: Family Medicine

## 2016-08-04 ENCOUNTER — Ambulatory Visit (INDEPENDENT_AMBULATORY_CARE_PROVIDER_SITE_OTHER): Payer: Medicare Other | Admitting: Family Medicine

## 2016-08-04 ENCOUNTER — Other Ambulatory Visit: Payer: Self-pay

## 2016-08-04 VITALS — BP 114/80 | HR 76 | Temp 98.3°F | Wt 190.4 lb

## 2016-08-04 DIAGNOSIS — R002 Palpitations: Secondary | ICD-10-CM

## 2016-08-04 DIAGNOSIS — R0789 Other chest pain: Secondary | ICD-10-CM

## 2016-08-04 DIAGNOSIS — I10 Essential (primary) hypertension: Secondary | ICD-10-CM | POA: Diagnosis not present

## 2016-08-04 LAB — EKG 12-LEAD

## 2016-08-04 MED ORDER — METOPROLOL SUCCINATE ER 50 MG PO TB24: 50.0000 mg | ORAL_TABLET | Freq: Every day | ORAL | 3 refills | Status: DC

## 2016-08-04 NOTE — Patient Instructions (Signed)
Nonspecific Chest Pain  °Chest pain can be caused by many different conditions. There is always a chance that your pain could be related to something serious, such as a heart attack or a blood clot in your lungs. Chest pain can also be caused by conditions that are not life-threatening. If you have chest pain, it is very important to follow up with your health care provider. °CAUSES  °Chest pain can be caused by: °· Heartburn. °· Pneumonia or bronchitis. °· Anxiety or stress. °· Inflammation around your heart (pericarditis) or lung (pleuritis or pleurisy). °· A blood clot in your lung. °· A collapsed lung (pneumothorax). It can develop suddenly on its own (spontaneous pneumothorax) or from trauma to the chest. °· Shingles infection (varicella-zoster virus). °· Heart attack. °· Damage to the bones, muscles, and cartilage that make up your chest wall. This can include: °¨ Bruised bones due to injury. °¨ Strained muscles or cartilage due to frequent or repeated coughing or overwork. °¨ Fracture to one or more ribs. °¨ Sore cartilage due to inflammation (costochondritis). °RISK FACTORS  °Risk factors for chest pain may include: °· Activities that increase your risk for trauma or injury to your chest. °· Respiratory infections or conditions that cause frequent coughing. °· Medical conditions or overeating that can cause heartburn. °· Heart disease or family history of heart disease. °· Conditions or health behaviors that increase your risk of developing a blood clot. °· Having had chicken pox (varicella zoster). °SIGNS AND SYMPTOMS °Chest pain can feel like: °· Burning or tingling on the surface of your chest or deep in your chest. °· Crushing, pressure, aching, or squeezing pain. °· Dull or sharp pain that is worse when you move, cough, or take a deep breath. °· Pain that is also felt in your back, neck, shoulder, or arm, or pain that spreads to any of these areas. °Your chest pain may come and go, or it may stay  constant. °DIAGNOSIS °Lab tests or other studies may be needed to find the cause of your pain. Your health care provider may have you take a test called an ambulatory ECG (electrocardiogram). An ECG records your heartbeat patterns at the time the test is performed. You may also have other tests, such as: °· Transthoracic echocardiogram (TTE). During echocardiography, sound waves are used to create a picture of all of the heart structures and to look at how blood flows through your heart. °· Transesophageal echocardiogram (TEE). This is a more advanced imaging test that obtains images from inside your body. It allows your health care provider to see your heart in finer detail. °· Cardiac monitoring. This allows your health care provider to monitor your heart rate and rhythm in real time. °· Holter monitor. This is a portable device that records your heartbeat and can help to diagnose abnormal heartbeats. It allows your health care provider to track your heart activity for several days, if needed. °· Stress tests. These can be done through exercise or by taking medicine that makes your heart beat more quickly. °· Blood tests. °· Imaging tests. °TREATMENT  °Your treatment depends on what is causing your chest pain. Treatment may include: °· Medicines. These may include: °¨ Acid blockers for heartburn. °¨ Anti-inflammatory medicine. °¨ Pain medicine for inflammatory conditions. °¨ Antibiotic medicine, if an infection is present. °¨ Medicines to dissolve blood clots. °¨ Medicines to treat coronary artery disease. °· Supportive care for conditions that do not require medicines. This may include: °¨ Resting. °¨ Applying heat   or cold packs to injured areas. °¨ Limiting activities until pain decreases. °HOME CARE INSTRUCTIONS °· If you were prescribed an antibiotic medicine, finish it all even if you start to feel better. °· Avoid any activities that bring on chest pain. °· Do not use any tobacco products, including  cigarettes, chewing tobacco, or electronic cigarettes. If you need help quitting, ask your health care provider. °· Do not drink alcohol. °· Take medicines only as directed by your health care provider. °· Keep all follow-up visits as directed by your health care provider. This is important. This includes any further testing if your chest pain does not go away. °· If heartburn is the cause for your chest pain, you may be told to keep your head raised (elevated) while sleeping. This reduces the chance that acid will go from your stomach into your esophagus. °· Make lifestyle changes as directed by your health care provider. These may include: °¨ Getting regular exercise. Ask your health care provider to suggest some activities that are safe for you. °¨ Eating a heart-healthy diet. A registered dietitian can help you to learn healthy eating options. °¨ Maintaining a healthy weight. °¨ Managing diabetes, if necessary. °¨ Reducing stress. °SEEK MEDICAL CARE IF: °· Your chest pain does not go away after treatment. °· You have a rash with blisters on your chest. °· You have a fever. °SEEK IMMEDIATE MEDICAL CARE IF:  °· Your chest pain is worse. °· You have an increasing cough, or you cough up blood. °· You have severe abdominal pain. °· You have severe weakness. °· You faint. °· You have chills. °· You have sudden, unexplained chest discomfort. °· You have sudden, unexplained discomfort in your arms, back, neck, or jaw. °· You have shortness of breath at any time. °· You suddenly start to sweat, or your skin gets clammy. °· You feel nauseous or you vomit. °· You suddenly feel light-headed or dizzy. °· Your heart begins to beat quickly, or it feels like it is skipping beats. °These symptoms may represent a serious problem that is an emergency. Do not wait to see if the symptoms will go away. Get medical help right away. Call your local emergency services (911 in the [place] Do not drive yourself to the hospital. °  °This  information is not intended to replace advice given to you by your health care provider. Make sure you discuss any questions you have with your health care provider. °  °Document Released: 09/02/2005 Document Revised: 12/14/2014 Document Reviewed: 06/29/2014 °Elsevier Interactive Patient Education ©2016 [employer] Inc[employer] ° °

## 2016-08-04 NOTE — Telephone Encounter (Signed)
See [facility] note. Appt scheduled today w/ Dr. Carollee Herter.

## 2016-08-04 NOTE — Progress Notes (Unsigned)
Pre visit review using our clinic review tool, if applicable. No additional management support is needed unless otherwise documented below in the visit note. 

## 2016-08-04 NOTE — Progress Notes (Signed)
Subjective:    Patient ID: Ebony Garcia, female    DOB: September 22, 1951, 65 y.o.   MRN: [id number]  Chief Complaint  Patient presents with  . Hypertension    c/o elevated BP    HPI Patient is in today for f/u bp and palpitations.  Pt states she is having chest pains frequently and tightness in chest.  She called cardiology and she was told to come here.  Last episode of chest pain was over the weekend.  She has been under a lot of stress with the death of her brother.  +  Past Medical History:  Diagnosis Date  . Cancer ([place])   . CHF (congestive heart failure) ([place])    dystolic heart dysfunction  . Chronic kidney disease   . Depression   . Depression with anxiety   . Diabetes mellitus without complication ([place])    Diet controlled  . Hypertension   . Migraines   . Panic attacks     Past Surgical History:  Procedure Laterality Date  . ABDOMINAL HYSTERECTOMY    . BREAST SURGERY    . DILATION AND CURETTAGE OF UTERUS    . NASAL SEPTUM SURGERY    . papaloma Right   . TONSILLECTOMY AND ADENOIDECTOMY    . TOTAL ABDOMINAL HYSTERECTOMY W/ BILATERAL SALPINGOOPHORECTOMY     for fibroids and migraines  . TUBAL LIGATION      Family History  Problem Relation Age of Onset  . Crohn's disease Mother   . Atrial fibrillation Mother   . Heart failure Mother   . Bladder Cancer Father   . Prostate cancer Father   . Cancer Father     bladder  . Bipolar disorder Brother   . Bipolar disorder Sister   . Heart attack Maternal Grandmother     >65  . Diabetes Neg Hx   . Stroke Neg Hx     Social History   Social History  . Marital status: Divorced    Spouse name: N/A  . Number of children: 3  . Years of education: N/A   Occupational History  .  Unemployed   Social History Main Topics  . Smoking status: Never Smoker  . Smokeless tobacco: Never Used  . Alcohol use No  . Drug use: No  . Sexual activity: Yes    Partners: Male   Other Topics Concern  . Not on file    Social History Narrative   Lives alone.            Outpatient Medications Prior to Visit  Medication Sig Dispense Refill  . ALPRAZolam (XANAX) 1 MG tablet Take 1 mg by mouth at bedtime as needed for sleep.    Marland Kitchen aspirin EC 81 MG tablet Take 1 tablet (81 mg total) by mouth daily. 90 tablet 3  . benzoyl peroxide-erythromycin (BENZAMYCIN) gel Apply topically 2 (two) times daily as needed.    Marland Kitchen estradiol (ESTRACE) 0.5 MG tablet Take 1 tablet by mouth daily.    . fluocinonide gel (LIDEX) [id number] % Apply 1 application topically daily.    Marland Kitchen FLUoxetine (PROZAC) 20 MG capsule Take 20 mg by mouth daily.    . furosemide (LASIX) 20 MG tablet TAKE ONE TABLET BY MOUTH ONCE DAILY 90 tablet 0  . gabapentin (NEURONTIN) 300 MG capsule Take 1 capsule at bedtime for 7 days, then increase to 1 capsule twice daily. 60 capsule 2  . ketorolac (TORADOL) 10 MG tablet Take 1 tablet (10 mg total) by mouth  every 6 (six) hours as needed. 30 tablet 0  . nitroGLYCERIN (NITROSTAT) 0.4 MG SL tablet Place 1 tablet (0.4 mg total) under the tongue every 5 (five) minutes as needed for chest pain. 25 tablet 3  . promethazine (PHENERGAN) 25 MG tablet Take 25 mg by mouth as needed.    . triamcinolone ointment (KENALOG) 0.1 % Apply topically as needed    . losartan (COZAAR) 50 MG tablet TAKE ONE TABLET BY MOUTH ONCE DAILY 90 tablet 1   No facility-administered medications prior to visit.     Allergies  Allergen Reactions  . Amoxicillin-Pot Clavulanate Diarrhea  . Antihistamines, Diphenhydramine-Type Other (See Comments)    Other Reaction: makes pt nervous  . Diphenhydramine Hcl Other (See Comments)    Other Reaction: makes pt nervous  . Amoxicillin-Pot Clavulanate     REACTION: DIARRHEA  . Clindamycin     She has not taken but per patient  "her mother had a reaction and she does not want it ever"  . Hydrocodone-Acetaminophen     REACTION: sensitive to  but can take for extreme pain per patient  . Propoxyphene Hcl      REACTION: nausea  . Propoxyphene N-Acetaminophen     REACTION: nausea  . Sulfa Antibiotics     Unknown   . Tramadol Other (See Comments)    Pt does know what kind of effect  . Lisinopril     Restless , irregular sleep    Review of Systems  Constitutional: Negative for chills, fever and malaise/fatigue.  HENT: Negative for congestion and hearing loss.   Eyes: Negative for discharge.  Respiratory: Negative for cough, sputum production and shortness of breath.   Cardiovascular: Negative for chest pain, palpitations and leg swelling.  Gastrointestinal: Negative for abdominal pain, blood in stool, constipation, diarrhea, heartburn, nausea and vomiting.  Genitourinary: Negative for dysuria, frequency, hematuria and urgency.  Musculoskeletal: Negative for back pain, falls and myalgias.  Skin: Negative for rash.  Neurological: Negative for dizziness, sensory change, loss of consciousness, weakness and headaches.  Endo/Heme/Allergies: Negative for environmental allergies. Does not bruise/bleed easily.  Psychiatric/Behavioral: Negative for depression and suicidal ideas. The patient is not nervous/anxious and does not have insomnia.        Objective:    Physical Exam  Constitutional: She is oriented to person, place, and time. She appears well-developed and well-nourished.  HENT:  Head: Normocephalic and atraumatic.  Eyes: Conjunctivae and EOM are normal.  Neck: Normal range of motion. Neck supple. No JVD present. Carotid bruit is not present. No thyromegaly present.  Cardiovascular: Normal rate, regular rhythm and normal heart sounds.   No murmur heard. Pulmonary/Chest: Effort normal and breath sounds normal. No respiratory distress. She has no wheezes. She has no rales. She exhibits no tenderness.  Musculoskeletal: She exhibits no edema.  Neurological: She is alert and oriented to person, place, and time.  Psychiatric: She has a normal mood and affect. Her behavior is normal. Judgment  and thought content normal.  Nursing note and vitals reviewed.   BP 114/80 (BP Location: Left Arm, Patient Position: Sitting, Cuff Size: Normal)   Pulse 76   Temp 98.3 F (36.8 C) (Oral)   Wt 190 lb 6.4 oz (86.4 kg)   SpO2 98%   BMI 34.82 kg/m  Wt Readings from Last 3 Encounters:  08/04/16 190 lb 6.4 oz (86.4 kg)  07/30/16 188 lb 9.6 oz (85.5 kg)  04/24/16 187 lb 9.6 oz (85.1 kg)     Lab Results  Component Value Date   WBC 7.8 04/24/2016   HGB 12.9 04/24/2016   HCT 39.1 04/24/2016   PLT 143.0 (L) 04/24/2016   GLUCOSE 102 (H) 04/24/2016   CHOL 188 04/24/2016   TRIG 199.0 (H) 04/24/2016   HDL 45.30 04/24/2016   LDLDIRECT 98.1 01/21/2011   LDLCALC 103 (H) 04/24/2016   ALT 15 04/24/2016   AST 16 04/24/2016   NA 139 04/24/2016   K 3.6 04/24/2016   CL 108 04/24/2016   CREATININE 0.86 04/24/2016   BUN 12 04/24/2016   CO2 23 04/24/2016   TSH 1.81 08/19/2015   INR 0.93 07/19/2010   HGBA1C 5.9 12/08/2012   MICROALBUR 0.7 04/18/2012    Lab Results  Component Value Date   TSH 1.81 08/19/2015   Lab Results  Component Value Date   WBC 7.8 04/24/2016   HGB 12.9 04/24/2016   HCT 39.1 04/24/2016   MCV 91.0 04/24/2016   PLT 143.0 (L) 04/24/2016   Lab Results  Component Value Date   NA 139 04/24/2016   K 3.6 04/24/2016   CO2 23 04/24/2016   GLUCOSE 102 (H) 04/24/2016   BUN 12 04/24/2016   CREATININE 0.86 04/24/2016   BILITOT 0.3 04/24/2016   ALKPHOS 69 04/24/2016   AST 16 04/24/2016   ALT 15 04/24/2016   PROT 6.9 04/24/2016   ALBUMIN 4.2 04/24/2016   CALCIUM 8.8 04/24/2016   GFR 70.93 04/24/2016   Lab Results  Component Value Date   CHOL 188 04/24/2016   Lab Results  Component Value Date   HDL 45.30 04/24/2016   Lab Results  Component Value Date   LDLCALC 103 (H) 04/24/2016   Lab Results  Component Value Date   TRIG 199.0 (H) 04/24/2016   Lab Results  Component Value Date   CHOLHDL 4 04/24/2016   Lab Results  Component Value Date   HGBA1C  5.9 12/08/2012       Assessment & Plan:   Problem List Items Addressed This Visit      Unprioritized   Essential hypertension - Primary    Change med to metoprolol May help with anxiety and palpatiions rto 2-3 weeks      Relevant Medications   metoprolol succinate (TOPROL-XL) 50 MG 24 hr tablet   Other Relevant Orders   Lipid panel   POCT urinalysis dipstick   TSH   CBC with Differential/Platelet   Comprehensive metabolic panel    Other Visit Diagnoses    Palpitations       Relevant Medications   metoprolol succinate (TOPROL-XL) 50 MG 24 hr tablet   Other Relevant Orders   Cardiac event monitor   EKG 12-Lead (Completed)   Lipid panel   POCT urinalysis dipstick   TSH   CBC with Differential/Platelet   Comprehensive metabolic panel   Other chest pain       Relevant Orders   EKG 12-Lead (Completed)   Lipid panel   POCT urinalysis dipstick   TSH   CBC with Differential/Platelet   Comprehensive metabolic panel      I have discontinued Ms. Viti's losartan. I am also having her start on metoprolol succinate. Additionally, I am having her maintain her FLUoxetine, benzoyl peroxide-erythromycin, fluocinonide gel, ALPRAZolam, nitroGLYCERIN, aspirin EC, estradiol, ketorolac, promethazine, triamcinolone ointment, furosemide, and gabapentin.  Meds ordered this encounter  Medications  . metoprolol succinate (TOPROL-XL) 50 MG 24 hr tablet    Sig: Take 1 tablet (50 mg total) by mouth daily. Take with or immediately following a meal.  Dispense:  90 tablet    Refill:  3     [name], DO

## 2016-08-04 NOTE — Progress Notes (Signed)
Pre visit review using our clinic review tool, if applicable. No additional management support is needed unless otherwise documented below in the visit note. 

## 2016-08-05 NOTE — Assessment & Plan Note (Signed)
Change med to metoprolol May help with anxiety and palpatiions rto 2-3 weeks

## 2016-08-06 ENCOUNTER — Other Ambulatory Visit (INDEPENDENT_AMBULATORY_CARE_PROVIDER_SITE_OTHER): Payer: Medicare Other

## 2016-08-06 DIAGNOSIS — I10 Essential (primary) hypertension: Secondary | ICD-10-CM | POA: Diagnosis not present

## 2016-08-06 DIAGNOSIS — N390 Urinary tract infection, site not specified: Secondary | ICD-10-CM | POA: Diagnosis not present

## 2016-08-06 DIAGNOSIS — R0789 Other chest pain: Secondary | ICD-10-CM

## 2016-08-06 DIAGNOSIS — R82998 Other abnormal findings in urine: Secondary | ICD-10-CM

## 2016-08-06 DIAGNOSIS — R002 Palpitations: Secondary | ICD-10-CM

## 2016-08-06 LAB — POCT URINALYSIS DIPSTICK
Bilirubin, UA: NEGATIVE
Blood, UA: NEGATIVE
Glucose, UA: NEGATIVE
Ketones, UA: NEGATIVE
Leukocytes, UA: NEGATIVE
Nitrite, UA: NEGATIVE
Protein, UA: NEGATIVE
Spec Grav, UA: 1.025
Urobilinogen, UA: 0.2
pH, UA: 6

## 2016-08-06 LAB — COMPREHENSIVE METABOLIC PANEL
ALT: 22 U/L (ref 0–35)
AST: 20 U/L (ref 0–37)
Albumin: 3.8 g/dL (ref 3.5–5.2)
Alkaline Phosphatase: 67 U/L (ref 39–117)
BUN: 17 mg/dL (ref 6–23)
CO2: 28 mEq/L (ref 19–32)
Calcium: 8.4 mg/dL (ref 8.4–10.5)
Chloride: 105 mEq/L (ref 96–112)
Creatinine, Ser: 0.92 mg/dL (ref 0.40–1.20)
GFR: 65.56 mL/min (ref >60.00)
Glucose, Bld: 99 mg/dL (ref 70–99)
Potassium: 4.2 mEq/L (ref 3.5–5.1)
Sodium: 138 mEq/L (ref 135–145)
Total Bilirubin: 0.3 mg/dL (ref 0.2–1.2)
Total Protein: 6.5 g/dL (ref 6.0–8.3)

## 2016-08-06 LAB — CBC WITH DIFFERENTIAL/PLATELET
Basophils Absolute: 0 10*3/uL (ref 0.0–0.1)
Basophils Relative: 0.5 % (ref 0.0–3.0)
Eosinophils Absolute: 0.2 10*3/uL (ref 0.0–0.7)
Eosinophils Relative: 2.6 % (ref 0.0–5.0)
HCT: 37.5 % (ref 36.0–46.0)
Hemoglobin: 12.5 g/dL (ref 12.0–15.0)
Lymphocytes Relative: 21 % (ref 12.0–46.0)
Lymphs Abs: 1.5 10*3/uL (ref 0.7–4.0)
MCHC: 33.3 g/dL (ref 30.0–36.0)
MCV: 89.8 fl (ref 78.0–100.0)
Monocytes Absolute: 0.5 10*3/uL (ref 0.1–1.0)
Monocytes Relative: 6.4 % (ref 3.0–12.0)
Neutro Abs: 5 10*3/uL (ref 1.4–7.7)
Neutrophils Relative %: 69.5 % (ref 43.0–77.0)
Platelets: 95 10*3/uL — ABNORMAL LOW (ref 150.0–400.0)
RBC: 4.18 Mil/uL (ref 3.87–5.11)
RDW: 13.1 % (ref 11.5–15.5)
WBC: 7.2 10*3/uL (ref 4.0–10.5)

## 2016-08-06 LAB — LIPID PANEL
Cholesterol: 185 mg/dL (ref 0–200)
HDL: 52.8 mg/dL (ref >39.00)
LDL Cholesterol: 97 mg/dL (ref 0–99)
NonHDL: 131.74
Total CHOL/HDL Ratio: 3
Triglycerides: 176 mg/dL — ABNORMAL HIGH (ref 0.0–149.0)
VLDL: 35.2 mg/dL (ref 0.0–40.0)

## 2016-08-06 LAB — TSH: TSH: 3.23 u[IU]/mL (ref 0.35–4.50)

## 2016-08-07 ENCOUNTER — Encounter: Payer: Self-pay | Admitting: Family Medicine

## 2016-08-07 DIAGNOSIS — D696 Thrombocytopenia, unspecified: Secondary | ICD-10-CM

## 2016-08-07 LAB — URINE CULTURE: Organism ID, Bacteria: NO GROWTH

## 2016-08-07 NOTE — Telephone Encounter (Signed)
Please review and advise in Dr.Lowne's absence.    KP

## 2016-08-07 NOTE — Telephone Encounter (Signed)
future order for CBC, to be done in 10 days, thrombocytopenia  Received: Today  [place], MD  Damita Dunnings, CMA   CBC ordered.

## 2016-08-13 ENCOUNTER — Ambulatory Visit (INDEPENDENT_AMBULATORY_CARE_PROVIDER_SITE_OTHER): Payer: Medicare Other

## 2016-08-13 DIAGNOSIS — R002 Palpitations: Secondary | ICD-10-CM | POA: Diagnosis not present

## 2016-08-17 ENCOUNTER — Encounter: Payer: Self-pay | Admitting: Neurology

## 2016-08-17 NOTE — Telephone Encounter (Signed)
Please see fyi from pt.

## 2016-08-18 ENCOUNTER — Encounter: Payer: Self-pay | Admitting: Family Medicine

## 2016-08-25 ENCOUNTER — Ambulatory Visit: Payer: Medicare Other | Admitting: Family Medicine

## 2016-09-01 ENCOUNTER — Telehealth: Payer: Self-pay

## 2016-09-01 NOTE — Telephone Encounter (Signed)
Called patient to schedule follow up and repeat PHQ9 questionnaire

## 2016-09-08 ENCOUNTER — Encounter: Payer: Self-pay | Admitting: Family Medicine

## 2016-09-08 NOTE — Telephone Encounter (Signed)
Called patient to assess the symptoms, she stated that she is having mild chest discomfort, mild SOB, and feels sluggish,  No swelling, denied numbness, denied dizziness, she said her BP have been elevated when lying down, she said after taking the second Metoprolol the BP came down, but soon as she was int he supine position he BP went up. I advised patient she needs to be seen somewhere and she said she does not feel like driving today.  Please advise   KP

## 2016-09-08 NOTE — Telephone Encounter (Signed)
She should go to Er if she is having chest pain

## 2016-09-08 NOTE — Telephone Encounter (Signed)
Spoke with patient and she stated she would call her mother to take her to the hospital.     KP

## 2016-09-23 ENCOUNTER — Ambulatory Visit: Payer: Medicare Other | Admitting: Neurology

## 2016-09-24 ENCOUNTER — Encounter: Payer: Self-pay | Admitting: Neurology

## 2016-09-24 ENCOUNTER — Ambulatory Visit (INDEPENDENT_AMBULATORY_CARE_PROVIDER_SITE_OTHER): Payer: Medicare Other | Admitting: Neurology

## 2016-09-24 VITALS — BP 120/78 | HR 67 | Ht 62.0 in | Wt 195.2 lb

## 2016-09-24 DIAGNOSIS — G43009 Migraine without aura, not intractable, without status migrainosus: Secondary | ICD-10-CM | POA: Diagnosis not present

## 2016-09-24 NOTE — Patient Instructions (Signed)
1.  Continue gabapentin. 2.  Start walking 3.  Here is information on Mediterranean diet    Why follow it? Research shows. . Those who follow the Mediterranean diet have a reduced risk of heart disease  . The diet is associated with a reduced incidence of Parkinson's and Alzheimer's diseases . People following the diet may have longer life expectancies and lower rates of chronic diseases  . The Dietary Guidelines for Americans recommends the Mediterranean diet as an eating plan to promote health and prevent disease  What Is the Mediterranean Diet?  . Healthy eating plan based on typical foods and recipes of Mediterranean-style cooking . The diet is primarily a plant based diet; these foods should make up a majority of meals   Starches - Plant based foods should make up a majority of meals - They are an important sources of vitamins, minerals, energy, antioxidants, and fiber - Choose whole grains, foods high in fiber and minimally processed items  - Typical grain sources include wheat, oats, barley, corn, brown rice, bulgar, farro, millet, polenta, couscous  - Various types of beans include chickpeas, lentils, fava beans, black beans, white beans   Fruits  Veggies - Large quantities of antioxidant rich fruits & veggies; 6 or more servings  - Vegetables can be eaten raw or lightly drizzled with oil and cooked  - Vegetables common to the traditional Mediterranean Diet include: artichokes, arugula, beets, broccoli, brussel sprouts, cabbage, carrots, celery, collard greens, cucumbers, eggplant, kale, leeks, lemons, lettuce, mushrooms, okra, onions, peas, peppers, potatoes, pumpkin, radishes, rutabaga, shallots, spinach, sweet potatoes, turnips, zucchini - Fruits common to the Mediterranean Diet include: apples, apricots, avocados, cherries, clementines, dates, figs, grapefruits, grapes, melons, nectarines, oranges, peaches, pears, pomegranates, strawberries, tangerines  Fats - Replace butter and  margarine with healthy oils, such as olive oil, canola oil, and tahini  - Limit nuts to no more than a handful a day  - Nuts include walnuts, almonds, pecans, pistachios, pine nuts  - Limit or avoid candied, honey roasted or heavily salted nuts - Olives are central to the [employer] - can be eaten whole or used in a variety of dishes   Meats Protein - Limiting red meat: no more than a few times a month - When eating red meat: choose lean cuts and keep the portion to the size of deck of cards - Eggs: approx. 0 to 4 times a week  - Fish and lean poultry: at least 2 a week  - Healthy protein sources include, chicken, [place], lean beef, lamb - Increase intake of seafood such as tuna, salmon, trout, mackerel, shrimp, scallops - Avoid or limit high fat processed meats such as sausage and bacon  Dairy - Include moderate amounts of low fat dairy products  - Focus on healthy dairy such as fat free yogurt, skim milk, low or reduced fat cheese - Limit dairy products higher in fat such as whole or 2% milk, cheese, ice cream  Alcohol - Moderate amounts of red wine is ok  - No more than 5 oz daily for women (all ages) and men older than age 43  - No more than 10 oz of wine daily for men younger than 72  Other - Limit sweets and other desserts  - Use herbs and spices instead of salt to flavor foods  - Herbs and spices common to the traditional Mediterranean Diet include: basil, bay leaves, chives, cloves, cumin, fennel, garlic, lavender, marjoram, mint, oregano, parsley, pepper, rosemary, sage, savory,  sumac, tarragon, thyme   It's not just a diet, it's a lifestyle:  . The Mediterranean diet includes lifestyle factors typical of those in the region  . Foods, drinks and meals are best eaten with others and savored . Daily physical activity is important for overall good health . This could be strenuous exercise like running and aerobics . This could also be more leisurely activities such as  walking, housework, yard-work, or taking the stairs . Moderation is the key; a balanced and healthy diet accommodates most foods and drinks . Consider portion sizes and frequency of consumption of certain foods   Meal Ideas & Options:  . Breakfast:  o Whole wheat toast or whole wheat English muffins with peanut butter & hard boiled egg o Steel cut oats topped with apples & cinnamon and skim milk  o Fresh fruit: banana, strawberries, melon, berries, peaches  o Smoothies: strawberries, bananas, greek yogurt, peanut butter o Low fat greek yogurt with blueberries and granola  o Egg white omelet with spinach and mushrooms o Breakfast couscous: whole wheat couscous, apricots, skim milk, cranberries  . Sandwiches:  o Hummus and grilled vegetables (peppers, zucchini, squash) on whole wheat bread   o Grilled chicken on whole wheat pita with lettuce, tomatoes, cucumbers or tzatziki  o Tuna salad on whole wheat bread: tuna salad made with greek yogurt, olives, red peppers, capers, green onions o Garlic rosemary lamb pita: lamb sauted with garlic, rosemary, salt & pepper; add lettuce, cucumber, greek yogurt to pita - flavor with lemon juice and black pepper  . Seafood:  o Mediterranean grilled salmon, seasoned with garlic, basil, parsley, lemon juice and black pepper o Shrimp, lemon, and spinach whole-grain pasta salad made with low fat greek yogurt  o Seared scallops with lemon orzo  o Seared tuna steaks seasoned salt, pepper, coriander topped with tomato mixture of olives, tomatoes, olive oil, minced garlic, parsley, green onions and cappers  . Meats:  o Herbed greek chicken salad with kalamata olives, cucumber, feta  o Red bell peppers stuffed with spinach, bulgur, lean ground beef (or lentils) & topped with feta   o Kebabs: skewers of chicken, tomatoes, onions, zucchini, squash  o [place] burgers: made with red onions, mint, dill, lemon juice, feta cheese topped with roasted red  peppers . Vegetarian o Cucumber salad: cucumbers, artichoke hearts, celery, red onion, feta cheese, tossed in olive oil & lemon juice  o Hummus and whole grain pita points with a greek salad (lettuce, tomato, feta, olives, cucumbers, red onion) o Lentil soup with celery, carrots made with vegetable broth, garlic, salt and pepper  o Tabouli salad: parsley, bulgur, mint, scallions, cucumbers, tomato, radishes, lemon juice, olive oil, salt and pepper. 4.  Follow up in 6 months.

## 2016-09-24 NOTE — Progress Notes (Signed)
NEUROLOGY FOLLOW UP OFFICE NOTE  Ebony Garcia [id number]  HISTORY OF PRESENT ILLNESS: Ebony Garcia is a 65 year old right-handed woman with hypertension, heart failure, renal calculi, depression, hyperlipidemia and CHF who follows up for migraine headache.    UPDATE:  Since starting gabapentin, headaches improved but she has gained some weight.  She has not been exercising as she should, however.  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  It is her first steady job in years.  Current NSAIDS:  Toradol Current analgesics:  no. Current triptans:  contraindicated Current anti-emetic:  Phenergan Current muscle relaxants:  no Current anti-anxiolytic:  Xanax 1mg  Current sleep aide:  Xanax 1mg  Current Antihypertensive medications:  Cozaar, Lasix Current Antidepressant medications:  Prozac Current Anticonvulsant medications:  gabapentin 300mg  at night   Renal function from 08/06/16 showed Na 138, K 4.2, BUN 17, Cr 0.92 and GFR 65.56.   HISTORY: Onset:  Since late 50s Location: usually left sided, periorbital, and back of head.  More recently, holocephalic Quality:  Sharp, aching pressure.  Non-throbbing Initial intensity:  7/10 Associated symptoms:  Nausea, photophobia and phonophobia.  No visual disturbance, osmophobia or vomiting Aura:  no Initial duration:  3 days Initial frequency:  20-22 headache days per month (or 5 headache free days per month) Triggers/exacerbating factors:  stress Relieving factors:  Cold mask, heating pack on neck and shoulders   Past abortive therapy:  Tylenol (ineffective), ice & heat (helps some), Imitrex 100mg , Zomig (effective), Vicodin, Frova, indomethacin, DHE (effective), magnesium (ineffective), feverfew (ineffective), Midrin (effective but made sleepy), naproxen (ineffective), toradol po (ineffective), Toradol 60mg  IM (effective), Excedrin migraine (ineffective), Lidocaine nasal drops (effective  but caused hives), Tramadol (side effects), Cambia (side effects)   Past preventative therapy:  propranolol (ineffective), Botox (tried once but gave flu symptoms), occipital nerve blocks, trigger point injections, amitriptyline (side effects), Depakote (hair loss), Effexor (for depression but no headache improvement on it), Lamictal (side effects), atenolol (ineffective), topiramate (side effects), zonisamide   Family history of headache:  Mother, grandmother, great-aunt, cousins.   08/16/08 MRI Brain w/wo performed for "explosive headaches": nonspecific punctate hyperintensities in the subcortical white matter. 08/16/08 MRA Head: Unremarkable.  There is mild attenuation in the vertebrobasilar junction and proximal basilar artery which is likely artifact.  PAST MEDICAL HISTORY: Past Medical History:  Diagnosis Date  . Cancer ([place])   . CHF (congestive heart failure) ([place])    dystolic heart dysfunction  . Chronic kidney disease   . Depression   . Depression with anxiety   . Diabetes mellitus without complication ([place])    Diet controlled  . Hypertension   . Migraines   . Panic attacks     MEDICATIONS: Current Outpatient Prescriptions on File Prior to Visit  Medication Sig Dispense Refill  . ALPRAZolam (XANAX) 1 MG tablet Take 1 mg by mouth at bedtime as needed for sleep.    Marland Kitchen aspirin EC 81 MG tablet Take 1 tablet (81 mg total) by mouth daily. 90 tablet 3  . benzoyl peroxide-erythromycin (BENZAMYCIN) gel Apply topically 2 (two) times daily as needed.    Marland Kitchen estradiol (ESTRACE) 0.5 MG tablet Take 1 tablet by mouth daily.    . fluocinonide gel (LIDEX) AB-123456789 % Apply 1 application topically daily.    Marland Kitchen FLUoxetine (PROZAC) 20 MG capsule Take 20 mg by mouth daily.    . furosemide (LASIX) 20 MG tablet TAKE ONE TABLET BY MOUTH ONCE DAILY 90 tablet 0  .  gabapentin (NEURONTIN) 300 MG capsule Take 1 capsule at bedtime for 7 days, then increase to 1 capsule twice daily. 60 capsule 2  . ketorolac  (TORADOL) 10 MG tablet Take 1 tablet (10 mg total) by mouth every 6 (six) hours as needed. 30 tablet 0  . metoprolol succinate (TOPROL-XL) 50 MG 24 hr tablet Take 1 tablet (50 mg total) by mouth daily. Take with or immediately following a meal. 90 tablet 3  . nitroGLYCERIN (NITROSTAT) 0.4 MG SL tablet Place 1 tablet (0.4 mg total) under the tongue every 5 (five) minutes as needed for chest pain. 25 tablet 3  . promethazine (PHENERGAN) 25 MG tablet Take 25 mg by mouth as needed.    . triamcinolone ointment (KENALOG) 0.1 % Apply topically as needed     No current facility-administered medications on file prior to visit.     ALLERGIES: Allergies  Allergen Reactions  . Amoxicillin-Pot Clavulanate Diarrhea  . Antihistamines, Diphenhydramine-Type Other (See Comments)    Other Reaction: makes pt nervous  . Diphenhydramine Hcl Other (See Comments)    Other Reaction: makes pt nervous  . Amoxicillin-Pot Clavulanate     REACTION: DIARRHEA  . Clindamycin     She has not taken but per patient  "her mother had a reaction and she does not want it ever"  . Hydrocodone-Acetaminophen     REACTION: sensitive to  but can take for extreme pain per patient  . Propoxyphene Hcl     REACTION: nausea  . Propoxyphene N-Acetaminophen     REACTION: nausea  . Sulfa Antibiotics     Unknown   . Tramadol Other (See Comments)    Pt does know what kind of effect  . Lisinopril     Restless , irregular sleep    FAMILY HISTORY: Family History  Problem Relation Age of Onset  . Crohn's disease Mother   . Atrial fibrillation Mother   . Heart failure Mother   . Bladder Cancer Father   . Prostate cancer Father   . Cancer Father     bladder  . Bipolar disorder Brother   . Bipolar disorder Sister   . Heart attack Maternal Grandmother     >65  . Diabetes Neg Hx   . Stroke Neg Hx     SOCIAL HISTORY: Social History   Social History  . Marital status: Divorced    Spouse name: N/A  . Number of children: 3    . Years of education: N/A   Occupational History  .  Unemployed   Social History Main Topics  . Smoking status: Never Smoker  . Smokeless tobacco: Never Used  . Alcohol use No  . Drug use: No  . Sexual activity: Yes    Partners: Male   Other Topics Concern  . Not on file   Social History Narrative   Lives alone.            REVIEW OF SYSTEMS: Constitutional: No fevers, chills, or sweats, no generalized fatigue, change in appetite Eyes: No visual changes, double vision, eye pain Ear, nose and throat: No hearing loss, ear pain, nasal congestion, sore throat Cardiovascular: No chest pain, palpitations Respiratory:  No shortness of breath at rest or with exertion, wheezes GastrointestinaI: No nausea, vomiting, diarrhea, abdominal pain, fecal incontinence Genitourinary:  No dysuria, urinary retention or frequency Musculoskeletal:  No neck pain, back pain Integumentary: No rash, pruritus, skin lesions Neurological: as above Psychiatric: depression, anxiety Endocrine: No palpitations, fatigue, diaphoresis, mood swings, change in  appetite, increased thirst Hematologic/Lymphatic:  No purpura, petechiae. Allergic/Immunologic: no itchy/runny eyes, nasal congestion, recent allergic reactions, rashes  PHYSICAL EXAM: Vitals:   09/24/16 0923  BP: 120/78  Pulse: 67   General: No acute distress.  Patient appears well-groomed. Head:  Normocephalic/atraumatic  IMPRESSION: Migraine without aura  PLAN: 1.  She will continue gabapentin 300mg  at bedtime for now and modify lifestyle in order to lose weight (routine exercise, hydration and change in diet.  I provided her information on the Mediterranean diet) 2.  Follow up in 6 months or as needed.  26 minutes spent face to face with patient, over 50% spent counseling.  Metta Clines, DO  CC:  Garnet Koyanagi, DO

## 2016-09-29 ENCOUNTER — Ambulatory Visit (INDEPENDENT_AMBULATORY_CARE_PROVIDER_SITE_OTHER): Payer: Medicare Other | Admitting: Family Medicine

## 2016-09-29 ENCOUNTER — Encounter: Payer: Self-pay | Admitting: Family Medicine

## 2016-09-29 VITALS — BP 140/82 | HR 60 | Temp 98.2°F | Ht 62.0 in | Wt 195.6 lb

## 2016-09-29 DIAGNOSIS — I5189 Other ill-defined heart diseases: Secondary | ICD-10-CM

## 2016-09-29 DIAGNOSIS — I519 Heart disease, unspecified: Secondary | ICD-10-CM

## 2016-09-29 DIAGNOSIS — R002 Palpitations: Secondary | ICD-10-CM | POA: Diagnosis not present

## 2016-09-29 DIAGNOSIS — I10 Essential (primary) hypertension: Secondary | ICD-10-CM

## 2016-09-29 DIAGNOSIS — Z23 Encounter for immunization: Secondary | ICD-10-CM | POA: Diagnosis not present

## 2016-09-29 DIAGNOSIS — I5031 Acute diastolic (congestive) heart failure: Secondary | ICD-10-CM

## 2016-09-29 MED ORDER — FUROSEMIDE 20 MG PO TABS: 20.0000 mg | ORAL_TABLET | Freq: Every day | ORAL | 3 refills | Status: DC

## 2016-09-29 MED ORDER — METOPROLOL SUCCINATE ER 50 MG PO TB24: 50.0000 mg | ORAL_TABLET | Freq: Every day | ORAL | 3 refills | Status: DC

## 2016-09-29 NOTE — Progress Notes (Signed)
Patient ID: Ebony Garcia, female    DOB: 1951/04/20  Age: 65 y.o. MRN: [id number]    Subjective:  Subjective  HPI Ebony Garcia presents for f/u bp and migraines.   Pt is doing better on toprol.  bp at home runs lower 130/80.  No cp or palpatations.    Review of Systems  Constitutional: Negative for activity change, appetite change, fatigue and unexpected weight change.  Respiratory: Negative for cough and shortness of breath.   Cardiovascular: Negative for chest pain and palpitations.  Neurological: Positive for headaches.  Psychiatric/Behavioral: Negative for behavioral problems and dysphoric mood. The patient is not nervous/anxious.     History Past Medical History:  Diagnosis Date  . Cancer ([place])   . CHF (congestive heart failure) ([place])    dystolic heart dysfunction  . Chronic kidney disease   . Depression   . Depression with anxiety   . Diabetes mellitus without complication ([place])    Diet controlled  . Hypertension   . Migraines   . Panic attacks     She has a past surgical history that includes Total abdominal hysterectomy w/ bilateral salpingoophorectomy; Tonsillectomy and adenoidectomy; Nasal septum surgery; Tubal ligation; Dilation and curettage of uterus; papaloma (Right); Breast surgery; and Abdominal hysterectomy.   Her family history includes Atrial fibrillation in her mother; Bipolar disorder in her brother and sister; Bladder Cancer in her father; Cancer in her father; Crohn's disease in her mother; Heart attack in her maternal grandmother; Heart failure in her mother; Prostate cancer in her father.She reports that she has never smoked. She has never used smokeless tobacco. She reports that she does not drink alcohol or use drugs.  Current Outpatient Prescriptions on File Prior to Visit  Medication Sig Dispense Refill  . ALPRAZolam (XANAX) 1 MG tablet Take 1 mg by mouth at bedtime as needed for sleep.    Marland Kitchen aspirin EC 81 MG tablet Take 1 tablet (81 mg  total) by mouth daily. 90 tablet 3  . benzoyl peroxide-erythromycin (BENZAMYCIN) gel Apply topically 2 (two) times daily as needed.    Marland Kitchen estradiol (ESTRACE) 0.5 MG tablet Take 1 tablet by mouth daily.    . fluocinonide gel (LIDEX) [id number] % Apply 1 application topically daily.    Marland Kitchen FLUoxetine (PROZAC) 20 MG capsule Take 20 mg by mouth daily.    Marland Kitchen gabapentin (NEURONTIN) 300 MG capsule Take 1 capsule at bedtime for 7 days, then increase to 1 capsule twice daily. 60 capsule 2  . ketorolac (TORADOL) 10 MG tablet Take 1 tablet (10 mg total) by mouth every 6 (six) hours as needed. 30 tablet 0  . nitroGLYCERIN (NITROSTAT) 0.4 MG SL tablet Place 1 tablet (0.4 mg total) under the tongue every 5 (five) minutes as needed for chest pain. 25 tablet 3  . promethazine (PHENERGAN) 25 MG tablet Take 25 mg by mouth as needed.    . triamcinolone ointment (KENALOG) 0.1 % Apply topically as needed     No current facility-administered medications on file prior to visit.      Objective:  Objective  Physical Exam  Constitutional: She is oriented to person, place, and time. She appears well-developed and well-nourished.  HENT:  Head: Normocephalic and atraumatic.  Eyes: Conjunctivae and EOM are normal.  Neck: Normal range of motion. Neck supple. No JVD present. Carotid bruit is not present. No thyromegaly present.  Cardiovascular: Normal rate, regular rhythm and normal heart sounds.   No murmur heard. Pulmonary/Chest: Effort normal and breath sounds  normal. No respiratory distress. She has no wheezes. She has no rales. She exhibits no tenderness.  Musculoskeletal: She exhibits no edema.  Neurological: She is alert and oriented to person, place, and time.  Psychiatric: She has a normal mood and affect.  Nursing note and vitals reviewed.  BP 140/82 (BP Location: Left Arm, Patient Position: Sitting, Cuff Size: Normal)   Pulse 60   Temp 98.2 F (36.8 C) (Oral)   Ht 5\' 2"  (1.575 m)   Wt 195 lb 9.6 oz (88.7 kg)    SpO2 98%   BMI 35.78 kg/m  Wt Readings from Last 3 Encounters:  09/29/16 195 lb 9.6 oz (88.7 kg)  09/24/16 195 lb 3 oz (88.5 kg)  08/04/16 190 lb 6.4 oz (86.4 kg)     Lab Results  Component Value Date   WBC 7.2 08/06/2016   HGB 12.5 08/06/2016   HCT 37.5 08/06/2016   PLT 95.0 (L) 08/06/2016   GLUCOSE 99 08/06/2016   CHOL 185 08/06/2016   TRIG 176.0 (H) 08/06/2016   HDL 52.80 08/06/2016   LDLDIRECT 98.1 01/21/2011   LDLCALC 97 08/06/2016   ALT 22 08/06/2016   AST 20 08/06/2016   NA 138 08/06/2016   K 4.2 08/06/2016   CL 105 08/06/2016   CREATININE 0.92 08/06/2016   BUN 17 08/06/2016   CO2 28 08/06/2016   TSH 3.23 08/06/2016   INR 0.93 07/19/2010   HGBA1C 5.9 12/08/2012   MICROALBUR 0.7 04/18/2012    Mr Brain Wo Contrast  Result Date: 08/27/2015 CLINICAL DATA:  65 year old hypertensive diabetic female with headache. Mass in back of her head on the left side which has been present for many months. On disability for migraines. Cancer, type not specified. Initial encounter. EXAM: MRI HEAD WITHOUT CONTRAST TECHNIQUE: Multiplanar, multiecho pulse sequences of the brain and surrounding structures were obtained without intravenous contrast. COMPARISON:  08/15/2008 brain MR. FINDINGS: No mass noted left-side of head. There is slight asymmetry of subcutaneous fat. Question if this is what patient perceives as an abnormality. No acute infarct. No intracranial hemorrhage. Minimal punctate nonspecific white matter type changes most notable frontal lobes. This may be related to patient's migraine headaches or small vessel disease. No intracranial mass lesion noted on this unenhanced exam. No hydrocephalus. Major intracranial vascular structures are patent. Partial opacification left mastoid air cells. No obstructing lesion of the eustachian tube identified. Tiny parotid lesions suggestive of small lymph nodes. Minimal to mild mucosal thickening frontal sinuses, ethmoid sinus air cells and  maxillary sinuses. Mild transverse ligament hypertrophy. Cervicomedullary junction, pituitary region, pineal region orbital structures unremarkable. IMPRESSION: No mass noted left-side of head. There is slight asymmetry of subcutaneous fat. Question if this is what patient perceives as an abnormality. No acute infarct. No intracranial hemorrhage. Minimal punctate nonspecific white matter type changes most notable frontal lobes. This may be related to patient's migraine headaches or small vessel disease. No intracranial mass lesion noted on this unenhanced exam. Partial opacification left mastoid air cells. No obstructing lesion of the eustachian tube identified. Minimal to mild mucosal thickening frontal sinuses, ethmoid sinus air cells and maxillary sinuses. Electronically Signed   By: Genia Del M.D.   On: 08/27/2015 10:03     Assessment & Plan:  Plan  I have changed Ms. Schiller's furosemide. I am also having her maintain her FLUoxetine, benzoyl peroxide-erythromycin, fluocinonide gel, ALPRAZolam, nitroGLYCERIN, aspirin EC, estradiol, ketorolac, promethazine, triamcinolone ointment, gabapentin, and metoprolol succinate.  Meds ordered this encounter  Medications  .  metoprolol succinate (TOPROL-XL) 50 MG 24 hr tablet    Sig: Take 1 tablet (50 mg total) by mouth daily. Take with or immediately following a meal.    Dispense:  90 tablet    Refill:  3  . furosemide (LASIX) 20 MG tablet    Sig: Take 1 tablet (20 mg total) by mouth daily.    Dispense:  90 tablet    Refill:  3    Problem List Items Addressed This Visit      Unprioritized   Acute diastolic CHF (congestive heart failure) ([facility])    con't meds Per cardiology      Relevant Medications   metoprolol succinate (TOPROL-XL) 50 MG 24 hr tablet   furosemide (LASIX) 20 MG tablet   Essential hypertension    Stable , con't meds      Relevant Medications   metoprolol succinate (TOPROL-XL) 50 MG 24 hr tablet   furosemide (LASIX) 20  MG tablet    Other Visit Diagnoses    Need for hepatitis B vaccination    -  Primary   Relevant Orders   Hepatitis B vaccine adult IM (Completed)   Palpitations       Relevant Medications   metoprolol succinate (TOPROL-XL) 50 MG 24 hr tablet   Diastolic dysfunction       Relevant Medications   furosemide (LASIX) 20 MG tablet      Follow-up: Return in about 6 months (around 03/30/2017) for annual exam, fasting.  [name], DO

## 2016-09-29 NOTE — Progress Notes (Signed)
Pre visit review using our clinic review tool, if applicable. No additional management support is needed unless otherwise documented below in the visit note. 

## 2016-09-29 NOTE — Patient Instructions (Signed)
Hypertension Hypertension, commonly called high blood pressure, is when the force of blood pumping through your arteries is too strong. Your arteries are the blood vessels that carry blood from your heart throughout your body. A blood pressure reading consists of a higher number over a lower number, such as 110/72. The higher number (systolic) is the pressure inside your arteries when your heart pumps. The lower number (diastolic) is the pressure inside your arteries when your heart relaxes. Ideally you want your blood pressure below 120/80. Hypertension forces your heart to work harder to pump blood. Your arteries may become narrow or stiff. Having untreated or uncontrolled hypertension can cause heart attack, stroke, kidney disease, and other problems. RISK FACTORS Some risk factors for high blood pressure are controllable. Others are not.  Risk factors you cannot control include:   Race. You may be at higher risk if you are African American.  Age. Risk increases with age.  Gender. Men are at higher risk than women before age 45 years. After age 65, women are at higher risk than men. Risk factors you can control include:  Not getting enough exercise or physical activity.  Being overweight.  Getting too much fat, sugar, calories, or salt in your diet.  Drinking too much alcohol. SIGNS AND SYMPTOMS Hypertension does not usually cause signs or symptoms. Extremely high blood pressure (hypertensive crisis) may cause headache, anxiety, shortness of breath, and nosebleed. DIAGNOSIS To check if you have hypertension, your health care provider will measure your blood pressure while you are seated, with your arm held at the level of your heart. It should be measured at least twice using the same arm. Certain conditions can cause a difference in blood pressure between your right and left arms. A blood pressure reading that is higher than normal on one occasion does not mean that you need treatment. If  it is not clear whether you have high blood pressure, you may be asked to return on a different day to have your blood pressure checked again. Or, you may be asked to monitor your blood pressure at home for 1 or more weeks. TREATMENT Treating high blood pressure includes making lifestyle changes and possibly taking medicine. Living a healthy lifestyle can help lower high blood pressure. You may need to change some of your habits. Lifestyle changes may include:  Following the DASH diet. This diet is high in fruits, vegetables, and whole grains. It is low in salt, red meat, and added sugars.  Keep your sodium intake below 2,300 mg per day.  Getting at least 30-45 minutes of aerobic exercise at least 4 times per week.  Losing weight if necessary.  Not smoking.  Limiting alcoholic beverages.  Learning ways to reduce stress. Your health care provider may prescribe medicine if lifestyle changes are not enough to get your blood pressure under control, and if one of the following is true:  You are 18-59 years of age and your systolic blood pressure is above 140.  You are 60 years of age or older, and your systolic blood pressure is above 150.  Your diastolic blood pressure is above 90.  You have diabetes, and your systolic blood pressure is over 140 or your diastolic blood pressure is over 90.  You have kidney disease and your blood pressure is above 140/90.  You have heart disease and your blood pressure is above 140/90. Your personal target blood pressure may vary depending on your medical conditions, your age, and other factors. HOME CARE INSTRUCTIONS    Have your blood pressure rechecked as directed by your health care provider.   Take medicines only as directed by your health care provider. Follow the directions carefully. Blood pressure medicines must be taken as prescribed. The medicine does not work as well when you skip doses. Skipping doses also puts you at risk for  problems.  Do not smoke.   Monitor your blood pressure at home as directed by your health care provider. SEEK MEDICAL CARE IF:   You think you are having a reaction to medicines taken.  You have recurrent headaches or feel dizzy.  You have swelling in your ankles.  You have trouble with your vision. SEEK IMMEDIATE MEDICAL CARE IF:  You develop a severe headache or confusion.  You have unusual weakness, numbness, or feel faint.  You have severe chest or abdominal pain.  You vomit repeatedly.  You have trouble breathing. MAKE SURE YOU:   Understand these instructions.  Will watch your condition.  Will get help right away if you are not doing well or get worse.   This information is not intended to replace advice given to you by your health care provider. Make sure you discuss any questions you have with your health care provider.   Document Released: 11/23/2005 Document Revised: 04/09/2015 Document Reviewed: 09/15/2013 Elsevier Interactive Patient Education 2016 Elsevier Inc[employer]  

## 2016-10-01 NOTE — Assessment & Plan Note (Signed)
Stable , con't meds  

## 2016-10-01 NOTE — Assessment & Plan Note (Signed)
con't meds Per cardiology 

## 2016-10-06 ENCOUNTER — Encounter: Payer: Self-pay | Admitting: Neurology

## 2016-10-06 DIAGNOSIS — G43801 Other migraine, not intractable, with status migrainosus: Secondary | ICD-10-CM

## 2016-10-06 MED ORDER — PREDNISONE 10 MG PO TABS: ORAL_TABLET | ORAL | 0 refills | Status: DC

## 2016-10-06 MED ORDER — TOPIRAMATE ER 25 MG PO CAP24: ORAL_CAPSULE | ORAL | 0 refills | Status: DC

## 2016-10-06 NOTE — Telephone Encounter (Signed)
RX sent in

## 2016-10-06 NOTE — Telephone Encounter (Signed)
Pt replied with "Do I start Topamax AFTER Prednisone taper AND STOP Gabapentin today? Thank you for everything!!!! ??" Please advise.

## 2016-10-06 NOTE — Telephone Encounter (Signed)
Please see mychart message.

## 2016-10-06 NOTE — Telephone Encounter (Signed)
Please see pt's mychart message. Pt was taken off of Topamax due to side effects, note from 04/22/16 states, "Topamax does cause memory problems, mood changes and hair loss for her."  Please advise.

## 2016-10-07 ENCOUNTER — Encounter: Payer: Self-pay | Admitting: Neurology

## 2016-10-08 ENCOUNTER — Telehealth: Payer: Self-pay | Admitting: Neurology

## 2016-10-08 MED ORDER — TOPIRAMATE 25 MG PO TABS: ORAL_TABLET | ORAL | 0 refills | Status: DC

## 2016-10-08 NOTE — Telephone Encounter (Signed)
RX sent in. Trokendi removed from list. Mychart message sent to pt.

## 2016-10-08 NOTE — Telephone Encounter (Signed)
Please see message. Attempted to explain that due to side effect Topamax was not reccommended and that's why the Trokendi was sent to pharmacy. Please advise.

## 2016-10-08 NOTE — Telephone Encounter (Signed)
Karn [place] May 24, 2051. She said that the medication Trokendi with [employer] was $1100 and her copay would be $ 535 she said she would like to just have the Regular Topamax called in for her at [facility] on [address]. Her # is [phone]. Thank you

## 2016-10-08 NOTE — Telephone Encounter (Signed)
She knows the side effects, but we can try it again and hopefully it will be better tolerated:  Topiramate 25mg  at bedtime for one week, then 25mg  twice daily for one week, then 50mg  twice daily.  I want to see how she does on this dose before increasing any further.

## 2016-10-13 DIAGNOSIS — Z01419 Encounter for gynecological examination (general) (routine) without abnormal findings: Secondary | ICD-10-CM | POA: Diagnosis not present

## 2016-10-13 DIAGNOSIS — Z124 Encounter for screening for malignant neoplasm of cervix: Secondary | ICD-10-CM | POA: Diagnosis not present

## 2016-10-13 DIAGNOSIS — Z1231 Encounter for screening mammogram for malignant neoplasm of breast: Secondary | ICD-10-CM | POA: Diagnosis not present

## 2016-10-13 DIAGNOSIS — Z79890 Hormone replacement therapy (postmenopausal): Secondary | ICD-10-CM | POA: Diagnosis not present

## 2016-11-02 ENCOUNTER — Encounter: Payer: Self-pay | Admitting: Family Medicine

## 2016-11-02 ENCOUNTER — Encounter: Payer: Self-pay | Admitting: Neurology

## 2016-11-02 NOTE — Telephone Encounter (Signed)
Refill x 1 each-- can put 6 months refills on prozac

## 2016-11-02 NOTE — Telephone Encounter (Signed)
Please see mychart message from pt. Please advise.

## 2016-11-02 NOTE — Telephone Encounter (Signed)
Additional message from pt. Copied from new mychart encounter so its under one encounter.   "Forgot to mention that I have had several episodes of uncontrollable jerking of upper and lower limbs while either going to sleep or it wakes me up.My vision gets blurry at times and have hard time focusing once in a while."

## 2016-11-03 ENCOUNTER — Ambulatory Visit (INDEPENDENT_AMBULATORY_CARE_PROVIDER_SITE_OTHER): Payer: Medicare Other | Admitting: Family Medicine

## 2016-11-03 ENCOUNTER — Telehealth: Payer: Self-pay | Admitting: Family Medicine

## 2016-11-03 ENCOUNTER — Ambulatory Visit (INDEPENDENT_AMBULATORY_CARE_PROVIDER_SITE_OTHER): Payer: Medicare Other

## 2016-11-03 VITALS — BP 156/86 | HR 67 | Temp 98.2°F | Resp 16 | Ht 62.0 in | Wt 197.6 lb

## 2016-11-03 DIAGNOSIS — R05 Cough: Secondary | ICD-10-CM

## 2016-11-03 DIAGNOSIS — R079 Chest pain, unspecified: Secondary | ICD-10-CM | POA: Diagnosis not present

## 2016-11-03 DIAGNOSIS — R5383 Other fatigue: Secondary | ICD-10-CM

## 2016-11-03 DIAGNOSIS — R059 Cough, unspecified: Secondary | ICD-10-CM

## 2016-11-03 DIAGNOSIS — R519 Headache, unspecified: Secondary | ICD-10-CM

## 2016-11-03 DIAGNOSIS — R0602 Shortness of breath: Secondary | ICD-10-CM

## 2016-11-03 DIAGNOSIS — R51 Headache: Secondary | ICD-10-CM

## 2016-11-03 DIAGNOSIS — I5030 Unspecified diastolic (congestive) heart failure: Secondary | ICD-10-CM | POA: Diagnosis not present

## 2016-11-03 DIAGNOSIS — H538 Other visual disturbances: Secondary | ICD-10-CM | POA: Diagnosis not present

## 2016-11-03 LAB — POCT CBC
Granulocyte percent: 72 %G (ref 37–80)
HCT, POC: 39.3 % (ref 37.7–47.9)
Hemoglobin: 13.8 g/dL (ref 12.2–16.2)
Lymph, poc: 2.1 (ref 0.6–3.4)
MCH, POC: 30.4 pg (ref 27–31.2)
MCHC: 35.1 g/dL (ref 31.8–35.4)
MCV: 86.7 fL (ref 80–97)
MID (cbc): 0.6 (ref 0–0.9)
MPV: 7.2 fL (ref 0–99.8)
POC Granulocyte: 7 — AB (ref 2–6.9)
POC LYMPH PERCENT: 21.6 %L (ref 10–50)
POC MID %: 6.4 %M (ref 0–12)
Platelet Count, POC: 123 10*3/uL — AB (ref 142–424)
RBC: 4.53 M/uL (ref 4.04–5.48)
RDW, POC: 14.2 %
WBC: 9.7 10*3/uL (ref 4.6–10.2)

## 2016-11-03 LAB — GLUCOSE, POCT (MANUAL RESULT ENTRY): POC Glucose: 77 mg/dl (ref 70–99)

## 2016-11-03 MED ORDER — AZITHROMYCIN 250 MG PO TABS: ORAL_TABLET | ORAL | 0 refills | Status: DC

## 2016-11-03 MED ORDER — FLUOXETINE HCL 20 MG PO CAPS: 20.0000 mg | ORAL_CAPSULE | Freq: Every day | ORAL | 6 refills | Status: DC

## 2016-11-03 MED ORDER — ALPRAZOLAM 1 MG PO TABS: 1.0000 mg | ORAL_TABLET | Freq: Every evening | ORAL | 1 refills | Status: DC | PRN

## 2016-11-03 MED ORDER — BENZONATATE 100 MG PO CAPS: 100.0000 mg | ORAL_CAPSULE | Freq: Three times a day (TID) | ORAL | 0 refills | Status: DC | PRN

## 2016-11-03 NOTE — Telephone Encounter (Signed)
Ebony Garcia  to [name], DO       12:38 PM  My mother is going to take me to [facility] on [address] this afternoon. Just fyi for Dr. Carollee Herter. Thank you for the meds and getting back with me with this valuable info. Have a great day.   Ebony Garcia

## 2016-11-03 NOTE — Telephone Encounter (Signed)
Called to follow up with patient. Pt states she's on her way to [facility].  States mother will be driving her and she should be there within the next 20-30 mins.

## 2016-11-03 NOTE — Telephone Encounter (Signed)
Rx faxed to [facility]  

## 2016-11-03 NOTE — Patient Instructions (Addendum)
IF you received an x-ray today, you will receive an invoice from [facility]. Please contact [facility] at [phone] with questions or concerns regarding your invoice.   IF you received labwork today, you will receive an invoice from [employer]. Please contact [facility] at [phone] with questions or concerns regarding your invoice.   Our billing staff will not be able to assist you with questions regarding bills from these companies.  You will be contacted with the lab results as soon as they are available. The fastest way to get your results is to activate your My Chart account. Instructions are located on the last page of this paperwork. If you have not heard from [place] regarding the results in 2 weeks, please contact this office.      For headaches and blurry vision, call your neurologist for same tomorrow morning to determine if you need to have an office visit or other evaluation for those symptoms.  For your chest pains and shortness of breath, I do not hear any fluid in your lungs on your exam tonight, EKG appears okay, chest x-ray without pneumonia or fluid seen. I will check a heart failure test.  Call your cardiologist for follow-up to discuss the symptoms. If you have new chest pain that does not resolve as quickly as it has in the past or any worsening symptoms, call 911 or go to the emergency room.  Compare sinus symptoms and cough, viral illness as possible, but based on timing and worsening, will treat for sinusitis/bronchitis. Start azithromycin 2 pills today, one pill for the next 4 days. Tessalon Perles as needed for cough up to 3 times per day. If you're having worsening shortness of breath or wheezing, return here or emergency room. Follow-up with your primary provider in the next week if possible for these symptoms, as well as to discuss your ongoing fatigue and other symptoms further. I did check  the mono test, thyroid  test and other electrolytes for fatigue.  Return to the clinic or go to the nearest emergency room if any of your symptoms worsen or new symptoms occur.   Cough, Adult Coughing is a reflex that clears your throat and your airways. Coughing helps to heal and protect your lungs. It is normal to cough occasionally, but a cough that happens with other symptoms or lasts a long time may be a sign of a condition that needs treatment. A cough may last only 2-3 weeks (acute), or it may last longer than 8 weeks (chronic). What are the causes? Coughing is commonly caused by:  Breathing in substances that irritate your lungs.  A viral or bacterial respiratory infection.  Allergies.  Asthma.  Postnasal drip.  Smoking.  Acid backing up from the stomach into the esophagus (gastroesophageal reflux).  Certain medicines.  Chronic lung problems, including COPD (or rarely, lung cancer).  Other medical conditions such as heart failure. Follow these instructions at home: Pay attention to any changes in your symptoms. Take these actions to help with your discomfort:  Take medicines only as told by your health care provider.  If you were prescribed an antibiotic medicine, take it as told by your health care provider. Do not stop taking the antibiotic even if you start to feel better.  Talk with your health care provider before you take a cough suppressant medicine.  Drink enough fluid to keep your urine clear or pale yellow.  If the air is dry, use a cold steam  vaporizer or humidifier in your bedroom or your home to help loosen secretions.  Avoid anything that causes you to cough at work or at home.  If your cough is worse at night, try sleeping in a semi-upright position.  Avoid cigarette smoke. If you smoke, quit smoking. If you need help quitting, ask your health care provider.  Avoid caffeine.  Avoid alcohol.  Rest as needed. Contact a health care provider if:  You have new  symptoms.  You cough up pus.  Your cough does not get better after 2-3 weeks, or your cough gets worse.  You cannot control your cough with suppressant medicines and you are losing sleep.  You develop pain that is getting worse or pain that is not controlled with pain medicines.  You have a fever.  You have unexplained weight loss.  You have night sweats. Get help right away if:  You cough up blood.  You have difficulty breathing.  Your heartbeat is very fast. This information is not intended to replace advice given to you by your health care provider. Make sure you discuss any questions you have with your health care provider. Document Released: 05/22/2011 Document Revised: 04/30/2016 Document Reviewed: 01/30/2015 Elsevier Interactive Patient Education  2017 [employer]  Fatigue Introduction Fatigue is feeling tired all of the time, a lack of energy, or a lack of motivation. Occasional or mild fatigue is often a normal response to activity or life in general. However, long-lasting (chronic) or extreme fatigue may indicate an underlying medical condition. Follow these instructions at home: Watch your fatigue for any changes. The following actions may help to lessen any discomfort you are feeling:  Talk to your health care provider about how much sleep you need each night. Try to get the required amount every night.  Take medicines only as directed by your health care provider.  Eat a healthy and nutritious diet. Ask your health care provider if you need help changing your diet.  Drink enough fluid to keep your urine clear or pale yellow.  Practice ways of relaxing, such as yoga, meditation, massage therapy, or acupuncture.  Exercise regularly.  Change situations that cause you stress. Try to keep your work and personal routine reasonable.  Do not abuse illegal drugs.  Limit alcohol intake to no more than 1 drink per day for nonpregnant women and 2 drinks per day for  men. One drink equals 12 ounces of beer, 5 ounces of wine, or 1 ounces of hard liquor.  Take a multivitamin, if directed by your health care provider. Contact a health care provider if:  Your fatigue does not get better.  You have a fever.  You have unintentional weight loss or gain.  You have headaches.  You have difficulty:  Falling asleep.  Sleeping throughout the night.  You feel angry, guilty, anxious, or sad.  You are unable to have a bowel movement (constipation).  You skin is dry.  Your legs or another part of your body is swollen. Get help right away if:  You feel confused.  Your vision is blurry.  You feel faint or pass out.  You have a severe headache.  You have severe abdominal, pelvic, or back pain.  You have chest pain, shortness of breath, or an irregular or fast heartbeat.  You are unable to urinate or you urinate less than normal.  You develop abnormal bleeding, such as bleeding from the rectum, vagina, nose, lungs, or nipples.  You vomit blood.  You have  thoughts about harming yourself or committing suicide.  You are worried that you might harm someone else. This information is not intended to replace advice given to you by your health care provider. Make sure you discuss any questions you have with your health care provider. Document Released: 09/20/2007 Document Revised: 04/30/2016 Document Reviewed: 03/27/2014  2017 Elsevier  Nonspecific Chest Pain Chest pain can be caused by many different conditions. There is always a chance that your pain could be related to something serious, such as a heart attack or a blood clot in your lungs. Chest pain can also be caused by conditions that are not life-threatening. If you have chest pain, it is very important to follow up with your health care provider. What are the causes? Causes of this condition include:  Heartburn.  Pneumonia or bronchitis.  Anxiety or stress.  Inflammation around your  heart (pericarditis) or lung (pleuritis or pleurisy).  A blood clot in your lung.  A collapsed lung (pneumothorax). This can develop suddenly on its own (spontaneous pneumothorax) or from trauma to the chest.  Shingles infection (varicella-zoster virus).  Heart attack.  Damage to the bones, muscles, and cartilage that make up your chest wall. This can include:  Bruised bones due to injury.  Strained muscles or cartilage due to frequent or repeated coughing or overwork.  Fracture to one or more ribs.  Sore cartilage due to inflammation (costochondritis). What increases the risk? Risk factors for this condition may include:  Activities that increase your risk for trauma or injury to your chest.  Respiratory infections or conditions that cause frequent coughing.  Medical conditions or overeating that can cause heartburn.  Heart disease or family history of heart disease.  Conditions or health behaviors that increase your risk of developing a blood clot.  Having had chicken pox (varicella zoster). What are the signs or symptoms? Chest pain can feel like:  Burning or tingling on the surface of your chest or deep in your chest.  Crushing, pressure, aching, or squeezing pain.  Dull or sharp pain that is worse when you move, cough, or take a deep breath.  Pain that is also felt in your back, neck, shoulder, or arm, or pain that spreads to any of these areas. Your chest pain may come and go, or it may stay constant. How is this diagnosed? Lab tests or other studies may be needed to find the cause of your pain. Your health care provider may have you take a test called an ECG (electrocardiogram). An ECG records your heartbeat patterns at the time the test is performed. You may also have other tests, such as:  Transthoracic echocardiogram (TTE). In this test, sound waves are used to create a picture of the heart structures and to look at how blood flows through your  heart.  Transesophageal echocardiogram (TEE).This is a more advanced imaging test that takes images from inside your body. It allows your health care provider to see your heart in finer detail.  Cardiac monitoring. This allows your health care provider to monitor your heart rate and rhythm in real time.  Holter monitor. This is a portable device that records your heartbeat and can help to diagnose abnormal heartbeats. It allows your health care provider to track your heart activity for several days, if needed.  Stress tests. These can be done through exercise or by taking medicine that makes your heart beat more quickly.  Blood tests.  Other imaging tests. How is this treated? Treatment depends on  what is causing your chest pain. Treatment may include:  Medicines. These may include:  Acid blockers for heartburn.  Anti-inflammatory medicine.  Pain medicine for inflammatory conditions.  Antibiotic medicine, if an infection is present.  Medicines to dissolve blood clots.  Medicines to treat coronary artery disease (CAD).  Supportive care for conditions that do not require medicines. This may include:  Resting.  Applying heat or cold packs to injured areas.  Limiting activities until pain decreases. Follow these instructions at home: Medicines  If you were prescribed an antibiotic, take it as told by your health care provider. Do not stop taking the antibiotic even if you start to feel better.  Take over-the-counter and prescription medicines only as told by your health care provider. Lifestyle  Do not use any products that contain nicotine or tobacco, such as cigarettes and e-cigarettes. If you need help quitting, ask your health care provider.  Do not drink alcohol.  Make lifestyle changes as directed by your health care provider. These may include:  Getting regular exercise. Ask your health care provider to suggest some activities that are safe for you.  Eating a  heart-healthy diet. A registered dietitian can help you to learn healthy eating options.  Maintaining a healthy weight.  Managing diabetes, if necessary.  Reducing stress, such as with yoga or relaxation techniques. General instructions  Avoid any activities that bring on chest pain.  If heartburn is the cause for your chest pain, raise (elevate) the head of your bed about 6 inches (15 cm) by putting blocks under the legs. Sleeping with more pillows does not effectively relieve heartburn because it only changes the position of your head.  Keep all follow-up visits as told by your health care provider. This is important. This includes any further testing if your chest pain does not go away. Contact a health care provider if:  Your chest pain does not go away.  You have a rash with blisters on your chest.  You have a fever.  You have chills. Get help right away if:  Your chest pain is worse.  You have a cough that gets worse, or you cough up blood.  You have severe pain in your abdomen.  You have severe weakness.  You faint.  You have sudden, unexplained chest discomfort.  You have sudden, unexplained discomfort in your arms, back, neck, or jaw.  You have shortness of breath at any time.  You suddenly start to sweat, or your skin gets clammy.  You feel nauseous or you vomit.  You suddenly feel light-headed or dizzy.  Your heart begins to beat quickly, or it feels like it is skipping beats. These symptoms may represent a serious problem that is an emergency. Do not wait to see if the symptoms will go away. Get medical help right away. Call your local emergency services (911 in the [place] Do not drive yourself to the hospital.  This information is not intended to replace advice given to you by your health care provider. Make sure you discuss any questions you have with your health care provider. Document Released: 09/02/2005 Document Revised: 08/17/2016 Document  Reviewed: 08/17/2016 Elsevier Interactive Patient Education  2017 [employer].

## 2016-11-03 NOTE — Progress Notes (Addendum)
By signing my name below, I, Ebony Garcia, attest that this documentation has been prepared under the direction and in the presence of Ebony Ray, MD.  Electronically Signed: Verlee Garcia, Medical Scribe. 11/03/16. 5:41 PM.  Subjective:    Patient ID: Ebony Garcia, female    DOB: 12-02-1951, 65 y.o.   MRN: [id number]  HPI Chief Complaint  Patient presents with  . Headache    x weeks and gets worse  . Fatigue    feel very tired  . Nausea  . Shortness of Breath    wheezing    HPI Comments: Ebony Garcia is a 65 y.o. female who presents to the [facility] complaining of multiple medical problems that occurred for the past few months. Hx of multiple media problems including diastolic CHF, HTN, HLD. She is on lasix 20mg  QD. PCP is Ebony Held, DO. multiple abx allergies per allergy list. There is a e-mail yesterday to PCP, multiple sxs noted on e-mail including nose pain, HA, fatigue, jerking of upper and lower limbs, nausea, and blurred vision. She was advised to go to ED; she is here for evaluation. She does have a neurologist Dr. Tomi Garcia. Has been treated for migraines.  Her sxs started with worsening fatigue with daytime somnolence, daily HAs, and dry cough for months. Reports associated sxs of intermittent chest pain for months (not currently), wheezing for the past 2-3 weeks when she's laying down, SOB on exertion for several weeks, sleep disturbance, and sinus pain. Mentions she's having rebound HAs from taking toradol tablets. Pt went from topamax the first of Nov to gabapentin, then discontinued gabapentin and back on topamax the first of Nov. She just started taking topamax 75mg  BID last night and mentions her HAs have improved this morning. Pt suspects her coughs have been from medication and she hasn't had any CXR since her cough began. Pt works as a Building control surveyor and her client has had suspicious PNA without treatment for they're sxs. Pt's  job inc her work activity to 4 days a week and her fatigue is making it hard to keep up. Denies orthopnea, chills Pt's cardiologist is Ebony Garcia. tsh normal in august.   Teeth and Gum Pain: Pt's left cheek outside of her nose area was sore yesterday as well as her gums and teeth. Pt has had mild gum and tooth pain for the past week and yesterday she's had severe left cheek pain located outisde of her nose area as well as severe gum/teeth pain. Her bp yesterday measured 173/103. She has not been taking anything for her sxs.   Vision: Intermittent blurred vision for the past 2 weeks, states when she wakes up her arms and legs are numb. Pt's last eye exam was 20/20. Pt has had preDM in the past. Denies slurred speech  Depression: Mentions last night was the first time she's been able to wash her hair in 8 days, she hasn't had incentive to do anything, social life. Pt states she's not suicidal, but she's ready to move on to [facility] since she's been divorced 3x, she doesn't see any of her grown boys, or little grandchildren in 80 months.   Pt's extremities jerk when she's about to go too sleep. Denies seizures, and tremors.  Patient Active Problem List   Diagnosis Date Noted  . Squamous cell carcinoma in situ of skin 12/26/2015  . Chest pain with moderate risk of acute coronary syndrome 02/07/2015  . Anxiety 02/07/2015  .  Palpitation 04/17/2014  . De Quervain's tenosynovitis, left 05/23/2013  . Acute diastolic CHF (congestive heart failure) ([place]) 04/30/2013  . Bradycardia 04/30/2013  . INTERTRIGO 11/08/2009  . MIGRAINE VARIANT 08/15/2008  . NONSPECIFIC ABNORMAL ELECTROCARDIOGRAM 08/15/2008  . Essential hypertension 08/09/2008  . Hyperlipidemia 02/27/2008  . Dysmetabolic syndrome X [id number]  . WEIGHT GAIN 11/02/2007  . POLYDIPSIA 11/02/2007  . Depressive disorder, not elsewhere classified 05/31/2007  . RENAL CALCULUS 02/21/2007   Past Medical History:  Diagnosis Date  . Cancer ([place])    . CHF (congestive heart failure) ([place])    dystolic heart dysfunction  . Chronic kidney disease   . Depression   . Depression with anxiety   . Diabetes mellitus without complication ([place])    Diet controlled  . Hypertension   . Migraines   . Panic attacks    Past Surgical History:  Procedure Laterality Date  . ABDOMINAL HYSTERECTOMY    . BREAST SURGERY    . DILATION AND CURETTAGE OF UTERUS    . NASAL SEPTUM SURGERY    . papaloma Right   . TONSILLECTOMY AND ADENOIDECTOMY    . TOTAL ABDOMINAL HYSTERECTOMY W/ BILATERAL SALPINGOOPHORECTOMY     for fibroids and migraines  . TUBAL LIGATION     Allergies  Allergen Reactions  . Amoxicillin-Pot Clavulanate Diarrhea  . Antihistamines, Diphenhydramine-Type Other (See Comments)    Other Reaction: makes pt nervous  . Diphenhydramine Hcl Other (See Comments)    Other Reaction: makes pt nervous  . Amoxicillin-Pot Clavulanate     REACTION: DIARRHEA  . Clindamycin     She has not taken but per patient  "her mother had a reaction and she does not want it ever"  . Hydrocodone-Acetaminophen     REACTION: sensitive to  but can take for extreme pain per patient  . Propoxyphene Hcl     REACTION: nausea  . Propoxyphene N-Acetaminophen     REACTION: nausea  . Sulfa Antibiotics     Unknown   . Tramadol Other (See Comments)    Pt does know what kind of effect  . Lisinopril     Restless , irregular sleep   Prior to Admission medications   Medication Sig Start Date End Date Taking? Authorizing Provider  ALPRAZolam Duanne Moron) 1 MG tablet Take 1 tablet (1 mg total) by mouth at bedtime as needed for sleep. 11/03/16   Rosalita Chessman Chase, DO  aspirin EC 81 MG tablet Take 1 tablet (81 mg total) by mouth daily. 08/19/15   Burtis Junes, NP  benzoyl peroxide-erythromycin [facility]) gel Apply topically 2 (two) times daily as needed. 12/08/12   Hendricks Limes, MD  estradiol (ESTRACE) 0.5 MG tablet Take 1 tablet by mouth daily. 12/24/15   Historical  Provider, MD  fluocinonide gel (LIDEX) [id number] % Apply 1 application topically daily. 08/18/13   Historical Provider, MD  FLUoxetine (PROZAC) 20 MG capsule Take 1 capsule (20 mg total) by mouth daily. 11/03/16   Rosalita Chessman Chase, DO  furosemide (LASIX) 20 MG tablet Take 1 tablet (20 mg total) by mouth daily. 09/29/16   Rosalita Chessman Chase, DO  gabapentin (NEURONTIN) 300 MG capsule Take 1 capsule at bedtime for 7 days, then increase to 1 capsule twice daily. 07/07/16   Pieter Partridge, DO  ketorolac (TORADOL) 10 MG tablet Take 1 tablet (10 mg total) by mouth every 6 (six) hours as needed. 04/22/16   Pieter Partridge, DO  metoprolol succinate (TOPROL-XL) 50 MG 24 hr  tablet Take 1 tablet (50 mg total) by mouth daily. Take with or immediately following a meal. 09/29/16   Rosalita Chessman Chase, DO  nitroGLYCERIN (NITROSTAT) 0.4 MG SL tablet Place 1 tablet (0.4 mg total) under the tongue every 5 (five) minutes as needed for chest pain. 08/19/15   Burtis Junes, NP  predniSONE (DELTASONE) 10 MG tablet 10mg  tablets. Take 6tabs x1day, then 5tabs x1day, then 4tabs x1day, then 3tabs x1day, then 2tabs x1day, then 1tab x1day, then STOP 10/06/16   Pieter Partridge, DO  promethazine (PHENERGAN) 25 MG tablet Take 25 mg by mouth as needed. 03/19/16   Historical Provider, MD  topiramate (TOPAMAX) 25 MG tablet Topiramate 25mg  at bedtime for one week, then 25mg  twice daily for one week, then 50mg  twice daily 10/08/16   Pieter Partridge, DO  triamcinolone ointment (KENALOG) 0.1 % Apply topically as needed 01/24/16   Historical Provider, MD   Social History   Social History  . Marital status: Divorced    Spouse name: N/A  . Number of children: 3  . Years of education: N/A   Occupational History  .  Unemployed   Social History Main Topics  . Smoking status: Never Smoker  . Smokeless tobacco: Never Used  . Alcohol use No  . Drug use: No  . Sexual activity: Yes    Partners: Male   Other Topics Concern  . Not on file   Social  History Narrative   Lives alone.           Review of Systems  Constitutional: Positive for fatigue.  HENT: Positive for sinus pain.   Eyes: Positive for visual disturbance.  Respiratory: Positive for cough, shortness of breath and wheezing.   Cardiovascular: Positive for chest pain.  Neurological: Positive for headaches. Negative for tremors and seizures.  Psychiatric/Behavioral: Positive for dysphoric mood and sleep disturbance.   Objective:  Physical Exam  Constitutional: She is oriented to person, place, and time. She appears well-developed and well-nourished. No distress.  HENT:  Head: Normocephalic and atraumatic.  Right Ear: Hearing, tympanic membrane, external ear and ear canal normal.  Left Ear: Hearing, tympanic membrane, external ear and ear canal normal.  Nose: Right sinus exhibits no maxillary sinus tenderness and no frontal sinus tenderness. Left sinus exhibits maxillary sinus tenderness (minimal). Left sinus exhibits no frontal sinus tenderness.  Mouth/Throat: Oropharynx is clear and moist. No oropharyngeal exudate.  Nasal bridge non tender  Eyes: Conjunctivae and EOM are normal. Pupils are equal, round, and reactive to light. Right eye exhibits no nystagmus. Left eye exhibits no nystagmus.  Cardiovascular: Normal rate, regular rhythm, normal heart sounds and intact distal pulses.   No murmur heard. Pulmonary/Chest: Effort normal and breath sounds normal. No respiratory distress. She has no wheezes. She has no rhonchi.  Musculoskeletal: She exhibits edema (Possible slight non pitting edema bilateral). She exhibits no tenderness (calf).  Neurological: She is alert and oriented to person, place, and time.  Skin: Skin is warm and dry. No rash noted.  Psychiatric: She has a normal mood and affect. Her behavior is normal.  Vitals reviewed.  BP (!) 156/86 (BP Location: Right Arm, Patient Position: Sitting, Cuff Size: Large)   Pulse 67   Temp 98.2 F (36.8 C) (Oral)   Resp  16   Ht 5\' 2"  (1.575 m)   Wt 197 lb 9.6 oz (89.6 kg)   SpO2 96%   BMI 36.14 kg/m    Results for orders placed or performed in  visit on 11/03/16  POCT CBC  Result Value Ref Range   WBC 9.7 4.6 - 10.2 K/uL   Lymph, poc 2.1 0.6 - 3.4   POC LYMPH PERCENT 21.6 10 - 50 %L   MID (cbc) 0.6 0 - 0.9   POC MID % 6.4 0 - 12 %M   POC Granulocyte 7.0 (A) 2 - 6.9   Granulocyte percent 72.0 37 - 80 %G   RBC 4.53 4.04 - 5.48 M/uL   Hemoglobin 13.8 12.2 - 16.2 g/dL   HCT, POC 39.3 37.7 - 47.9 %   MCV 86.7 80 - 97 fL   MCH, POC 30.4 27 - 31.2 pg   MCHC 35.1 31.8 - 35.4 g/dL   RDW, POC 14.2 %   Platelet Count, POC 123 (A) 142 - 424 K/uL   MPV 7.2 0 - 99.8 fL  POCT glucose (manual entry)  Result Value Ref Range   POC Glucose 77 70 - 99 mg/dl   EKG: sinus rhythm, no acute findings.   Dg Facial Bones Complete  Result Date: 11/03/2016 CLINICAL DATA:  65 y/o F; left-sided facial pain. No history of trauma. EXAM: FACIAL BONES COMPLETE 3+V COMPARISON:  None. FINDINGS: There is no evidence of fracture or other significant bone abnormality. No orbital emphysema or sinus air-fluid levels are seen. IMPRESSION: Negative. Electronically Signed   By: Kristine Garbe M.D.   On: 11/03/2016 18:24   Dg Chest 2 View  Result Date: 11/03/2016 CLINICAL DATA:  Cough for months now with wheezing EXAM: CHEST  2 VIEW COMPARISON:  CXR exams dating back through 05/17/2013 FINDINGS: The heart is borderline enlarged. The aorta is not aneurysmal in appearance. No pneumonic consolidation or CHF. No effusion or pneumothorax. Small ovoid density in the left upper lobe is stable dating back to 2014 and may represent a small vascular summation shadow. No acute osseous abnormality. IMPRESSION: No active cardiopulmonary disease. Electronically Signed   By: Ashley Royalty M.D.   On: 11/03/2016 18:29   Over 40 minutes of care with greater than 50%counseling, including chart review and prior provider note and telephone  encounters.    Assessment & Plan:    Bellanie T Garcia is a 65 y.o. female Cough - Plan: POCT CBC, DG Chest 2 View, Epstein-Barr virus VCA antibody panel, benzonatate (TESSALON) 100 MG capsule, azithromycin (ZITHROMAX) 250 MG tablet  - Possible recurrent viral illness, reassuring chest x-Garcia, CBC in office. Reported wheeze, but no history of asthma, no wheeze on exam, and reassuring O2 sat.  - Check BNP with history of diastolic heart failure, but clinically does not appear to be in acute failure at this time.  - Check EBV panel.  - Tessalon if needed for cough, azithromycin for possible sinobronchitis,  - RTC/ER precautions, or can follow-up with primary care provider if persistent. Consider pulmonary eval.  Fatigue, unspecified type - Plan: POCT CBC, POCT glucose (manual entry), Basic metabolic panel, Epstein-Barr virus VCA antibody panel, TSH  -May be multifactorial with illness, and depression/anxiety symptoms. Check TSH, EBV titers, but follow-up with primary care provider to discuss further workup.  Frequent headaches - Plan: Epstein-Barr virus VCA antibody panel Blurry vision - Plan: POCT glucose (manual entry)  - Some improvement with restarting Topamax, but with intermittent blurry vision, and persistent headaches, advised to call her neurologist the following morning to discuss if appointment or further imaging needed. ER precautions if acute worsening.   Face pain - Plan: DG Facial Bones Complete, azithromycin (ZITHROMAX) 250 MG tablet  -  Recent, possible early sinusitis, but with persistent fatigue cough other symptoms above, will treat with azithromycin. Other multiple antibiotic intolerances/allergies were noted. Saline nasal spray, symptomatic care, RTC/ER precautions.   Chest pain, unspecified type - Plan: DG Chest 2 View, EKG 12-Lead, Brain natriuretic peptide Diastolic congestive heart failure, unspecified congestive heart failure chronicity (HCC) - Plan: Brain natriuretic  peptide Shortness of breath - Plan: Brain natriuretic peptide  - Reassuring chest x-Garcia, EKG without acute findings, CBC in office overall reassuring and normal O2 sat. Lungs clear on exam, speaking in full sentences without perceived distress in office.  - Advised to call cardiology to discuss follow-up and further testing if needed, but if chest pain returns or worsens, or other new/worsening symptoms, ER evaluation was recommended.   Meds ordered this encounter  Medications  . benzonatate (TESSALON) 100 MG capsule    Sig: Take 1 capsule (100 mg total) by mouth 3 (three) times daily as needed for cough.    Dispense:  20 capsule    Refill:  0  . azithromycin (ZITHROMAX) 250 MG tablet    Sig: Take 2 pills by mouth on day 1, then 1 pill by mouth per day on days 2 through 5.    Dispense:  6 tablet    Refill:  0   Patient Instructions        IF you received an x-Garcia today, you will receive an invoice from [facility]. Please contact [facility] at [phone] with questions or concerns regarding your invoice.   IF you received labwork today, you will receive an invoice from [employer]. Please contact [facility] at [phone] with questions or concerns regarding your invoice.   Our billing staff will not be able to assist you with questions regarding bills from these companies.  You will be contacted with the lab results as soon as they are available. The fastest way to get your results is to activate your My Chart account. Instructions are located on the last page of this paperwork. If you have not heard from [place] regarding the results in 2 weeks, please contact this office.      For headaches and blurry vision, call your neurologist for same tomorrow morning to determine if you need to have an office visit or other evaluation for those symptoms.  For your chest pains and shortness of breath, I do not hear any fluid in your lungs on your  exam tonight, EKG appears okay, chest x-Garcia without pneumonia or fluid seen. I will check a heart failure test.  Call your cardiologist for follow-up to discuss the symptoms. If you have new chest pain that does not resolve as quickly as it has in the past or any worsening symptoms, call 911 or go to the emergency room.  Compare sinus symptoms and cough, viral illness as possible, but based on timing and worsening, will treat for sinusitis/bronchitis. Start azithromycin 2 pills today, one pill for the next 4 days. Tessalon Perles as needed for cough up to 3 times per day. If you're having worsening shortness of breath or wheezing, return here or emergency room. Follow-up with your primary provider in the next week if possible for these symptoms, as well as to discuss your ongoing fatigue and other symptoms further. I did check  the mono test, thyroid test and other electrolytes for fatigue.  Return to the clinic or go to the nearest emergency room if any of your symptoms worsen or new symptoms occur.   Cough,  Adult Coughing is a reflex that clears your throat and your airways. Coughing helps to heal and protect your lungs. It is normal to cough occasionally, but a cough that happens with other symptoms or lasts a long time may be a sign of a condition that needs treatment. A cough may last only 2-3 weeks (acute), or it may last longer than 8 weeks (chronic). What are the causes? Coughing is commonly caused by:  Breathing in substances that irritate your lungs.  A viral or bacterial respiratory infection.  Allergies.  Asthma.  Postnasal drip.  Smoking.  Acid backing up from the stomach into the esophagus (gastroesophageal reflux).  Certain medicines.  Chronic lung problems, including COPD (or rarely, lung cancer).  Other medical conditions such as heart failure. Follow these instructions at home: Pay attention to any changes in your symptoms. Take these actions to help with your  discomfort:  Take medicines only as told by your health care provider.  If you were prescribed an antibiotic medicine, take it as told by your health care provider. Do not stop taking the antibiotic even if you start to feel better.  Talk with your health care provider before you take a cough suppressant medicine.  Drink enough fluid to keep your urine clear or pale yellow.  If the air is dry, use a cold steam vaporizer or humidifier in your bedroom or your home to help loosen secretions.  Avoid anything that causes you to cough at work or at home.  If your cough is worse at night, try sleeping in a semi-upright position.  Avoid cigarette smoke. If you smoke, quit smoking. If you need help quitting, ask your health care provider.  Avoid caffeine.  Avoid alcohol.  Rest as needed. Contact a health care provider if:  You have new symptoms.  You cough up pus.  Your cough does not get better after 2-3 weeks, or your cough gets worse.  You cannot control your cough with suppressant medicines and you are losing sleep.  You develop pain that is getting worse or pain that is not controlled with pain medicines.  You have a fever.  You have unexplained weight loss.  You have night sweats. Get help right away if:  You cough up blood.  You have difficulty breathing.  Your heartbeat is very fast. This information is not intended to replace advice given to you by your health care provider. Make sure you discuss any questions you have with your health care provider. Document Released: 05/22/2011 Document Revised: 04/30/2016 Document Reviewed: 01/30/2015 Elsevier Interactive Patient Education  2017 Elsevier Inc[employer]  Fatigue Introduction Fatigue is feeling tired all of the time, a lack of energy, or a lack of motivation. Occasional or mild fatigue is often a normal response to activity or life in general. However, long-lasting (chronic) or extreme fatigue may indicate an underlying  medical condition. Follow these instructions at home: Watch your fatigue for any changes. The following actions may help to lessen any discomfort you are feeling:  Talk to your health care provider about how much sleep you need each night. Try to get the required amount every night.  Take medicines only as directed by your health care provider.  Eat a healthy and nutritious diet. Ask your health care provider if you need help changing your diet.  Drink enough fluid to keep your urine clear or pale yellow.  Practice ways of relaxing, such as yoga, meditation, massage therapy, or acupuncture.  Exercise regularly.  Change situations  that cause you stress. Try to keep your work and personal routine reasonable.  Do not abuse illegal drugs.  Limit alcohol intake to no more than 1 drink per day for nonpregnant women and 2 drinks per day for men. One drink equals 12 ounces of beer, 5 ounces of wine, or 1 ounces of hard liquor.  Take a multivitamin, if directed by your health care provider. Contact a health care provider if:  Your fatigue does not get better.  You have a fever.  You have unintentional weight loss or gain.  You have headaches.  You have difficulty:  Falling asleep.  Sleeping throughout the night.  You feel angry, guilty, anxious, or sad.  You are unable to have a bowel movement (constipation).  You skin is dry.  Your legs or another part of your body is swollen. Get help right away if:  You feel confused.  Your vision is blurry.  You feel faint or pass out.  You have a severe headache.  You have severe abdominal, pelvic, or back pain.  You have chest pain, shortness of breath, or an irregular or fast heartbeat.  You are unable to urinate or you urinate less than normal.  You develop abnormal bleeding, such as bleeding from the rectum, vagina, nose, lungs, or nipples.  You vomit blood.  You have thoughts about harming yourself or committing  suicide.  You are worried that you might harm someone else. This information is not intended to replace advice given to you by your health care provider. Make sure you discuss any questions you have with your health care provider. Document Released: 09/20/2007 Document Revised: 04/30/2016 Document Reviewed: 03/27/2014  2017 Elsevier  Nonspecific Chest Pain Chest pain can be caused by many different conditions. There is always a chance that your pain could be related to something serious, such as a heart attack or a blood clot in your lungs. Chest pain can also be caused by conditions that are not life-threatening. If you have chest pain, it is very important to follow up with your health care provider. What are the causes? Causes of this condition include:  Heartburn.  Pneumonia or bronchitis.  Anxiety or stress.  Inflammation around your heart (pericarditis) or lung (pleuritis or pleurisy).  A blood clot in your lung.  A collapsed lung (pneumothorax). This can develop suddenly on its own (spontaneous pneumothorax) or from trauma to the chest.  Shingles infection (varicella-zoster virus).  Heart attack.  Damage to the bones, muscles, and cartilage that make up your chest wall. This can include:  Bruised bones due to injury.  Strained muscles or cartilage due to frequent or repeated coughing or overwork.  Fracture to one or more ribs.  Sore cartilage due to inflammation (costochondritis). What increases the risk? Risk factors for this condition may include:  Activities that increase your risk for trauma or injury to your chest.  Respiratory infections or conditions that cause frequent coughing.  Medical conditions or overeating that can cause heartburn.  Heart disease or family history of heart disease.  Conditions or health behaviors that increase your risk of developing a blood clot.  Having had chicken pox (varicella zoster). What are the signs or  symptoms? Chest pain can feel like:  Burning or tingling on the surface of your chest or deep in your chest.  Crushing, pressure, aching, or squeezing pain.  Dull or sharp pain that is worse when you move, cough, or take a deep breath.  Pain that is also  felt in your back, neck, shoulder, or arm, or pain that spreads to any of these areas. Your chest pain may come and go, or it may stay constant. How is this diagnosed? Lab tests or other studies may be needed to find the cause of your pain. Your health care provider may have you take a test called an ECG (electrocardiogram). An ECG records your heartbeat patterns at the time the test is performed. You may also have other tests, such as:  Transthoracic echocardiogram (TTE). In this test, sound waves are used to create a picture of the heart structures and to look at how blood flows through your heart.  Transesophageal echocardiogram (TEE).This is a more advanced imaging test that takes images from inside your body. It allows your health care provider to see your heart in finer detail.  Cardiac monitoring. This allows your health care provider to monitor your heart rate and rhythm in real time.  Holter monitor. This is a portable device that records your heartbeat and can help to diagnose abnormal heartbeats. It allows your health care provider to track your heart activity for several days, if needed.  Stress tests. These can be done through exercise or by taking medicine that makes your heart beat more quickly.  Blood tests.  Other imaging tests. How is this treated? Treatment depends on what is causing your chest pain. Treatment may include:  Medicines. These may include:  Acid blockers for heartburn.  Anti-inflammatory medicine.  Pain medicine for inflammatory conditions.  Antibiotic medicine, if an infection is present.  Medicines to dissolve blood clots.  Medicines to treat coronary artery disease (CAD).  Supportive  care for conditions that do not require medicines. This may include:  Resting.  Applying heat or cold packs to injured areas.  Limiting activities until pain decreases. Follow these instructions at home: Medicines  If you were prescribed an antibiotic, take it as told by your health care provider. Do not stop taking the antibiotic even if you start to feel better.  Take over-the-counter and prescription medicines only as told by your health care provider. Lifestyle  Do not use any products that contain nicotine or tobacco, such as cigarettes and e-cigarettes. If you need help quitting, ask your health care provider.  Do not drink alcohol.  Make lifestyle changes as directed by your health care provider. These may include:  Getting regular exercise. Ask your health care provider to suggest some activities that are safe for you.  Eating a heart-healthy diet. A registered dietitian can help you to learn healthy eating options.  Maintaining a healthy weight.  Managing diabetes, if necessary.  Reducing stress, such as with yoga or relaxation techniques. General instructions  Avoid any activities that bring on chest pain.  If heartburn is the cause for your chest pain, raise (elevate) the head of your bed about 6 inches (15 cm) by putting blocks under the legs. Sleeping with more pillows does not effectively relieve heartburn because it only changes the position of your head.  Keep all follow-up visits as told by your health care provider. This is important. This includes any further testing if your chest pain does not go away. Contact a health care provider if:  Your chest pain does not go away.  You have a rash with blisters on your chest.  You have a fever.  You have chills. Get help right away if:  Your chest pain is worse.  You have a cough that gets worse, or you cough  up blood.  You have severe pain in your abdomen.  You have severe weakness.  You  faint.  You have sudden, unexplained chest discomfort.  You have sudden, unexplained discomfort in your arms, back, neck, or jaw.  You have shortness of breath at any time.  You suddenly start to sweat, or your skin gets clammy.  You feel nauseous or you vomit.  You suddenly feel light-headed or dizzy.  Your heart begins to beat quickly, or it feels like it is skipping beats. These symptoms may represent a serious problem that is an emergency. Do not wait to see if the symptoms will go away. Get medical help right away. Call your local emergency services (911 in the [place] Do not drive yourself to the hospital.  This information is not intended to replace advice given to you by your health care provider. Make sure you discuss any questions you have with your health care provider. Document Released: 09/02/2005 Document Revised: 08/17/2016 Document Reviewed: 08/17/2016 Elsevier Interactive Patient Education  [employer].         I personally performed the services described in this documentation, which was scribed in my presence. The recorded information has been reviewed and considered, and addended by me as needed.   Signed,   Ebony Ray, MD [facility].  11/03/16 7:01 PM

## 2016-11-03 NOTE — Telephone Encounter (Signed)
Called to follow up with patient. No answer.  Left a message for call back.

## 2016-11-03 NOTE — Telephone Encounter (Addendum)
Xanax Rx printed, awaiting DO signature. Fluoxetine sent to [facility].

## 2016-11-03 NOTE — Telephone Encounter (Signed)
Patient Name: Ebony Garcia  DOB: 10/20/1951    Initial Comment Caller states she is experiencing headaches for last 3 weeks, limbs been jerking, nausea, and blurred vision. Is on Topomax, but was on Gabapentin. Sleeping more than usual.    Nurse Assessment  Nurse: Leilani Merl, RN, [place] Date/Time (Eastern Time): 11/03/2016 11:38:01 AM  Confirm and document reason for call. If symptomatic, describe symptoms. You must click the next button to save text entered. ---Caller states she is experiencing headaches for last 3 weeks, limbs been jerking, nausea, and blurred vision. Is on Topomax, but was on Gabapentin. Sleeping more than usual.  Does the patient have any new or worsening symptoms? ---Yes  Will a triage be completed? ---Yes  Related visit to physician within the last 2 weeks? ---No  Does the PT have any chronic conditions? (i.e. diabetes, asthma, etc.) ---Yes  List chronic conditions. ---See MR  Is this a behavioral health or substance abuse call? ---No     Guidelines    Guideline Title Affirmed Question Affirmed Notes  Headache Patient sounds very sick or weak to the triager    Final Disposition User   Go to ED Now (or PCP triage) Leilani Merl, RN, [facility] - ED   Disagree/Comply: Comply

## 2016-11-03 NOTE — Telephone Encounter (Signed)
Noted  

## 2016-11-03 NOTE — Telephone Encounter (Signed)
Patient has been experiencing headaches x 2-3 weeks with Limb jerking Nausea and blurred vision. Transferred to [facility]

## 2016-11-04 LAB — BASIC METABOLIC PANEL
BUN: 17 mg/dL (ref 7–25)
CO2: 25 mmol/L (ref 20–31)
Calcium: 9 mg/dL (ref 8.6–10.4)
Chloride: 105 mmol/L (ref 98–110)
Creat: 1.03 mg/dL — ABNORMAL HIGH (ref 0.50–0.99)
Glucose, Bld: 88 mg/dL (ref 65–99)
Potassium: 3.9 mmol/L (ref 3.5–5.3)
Sodium: 137 mmol/L (ref 135–146)

## 2016-11-04 LAB — TSH: TSH: 3.62 mIU/L

## 2016-11-04 LAB — BRAIN NATRIURETIC PEPTIDE: Brain Natriuretic Peptide: 74.7 pg/mL (ref <100)

## 2016-11-05 LAB — EPSTEIN-BARR VIRUS VCA ANTIBODY PANEL
EBV NA IgG: <18 U/mL
EBV VCA IgG: 328 U/mL — ABNORMAL HIGH
EBV VCA IgM: <36 U/mL

## 2016-11-16 ENCOUNTER — Encounter: Payer: Self-pay | Admitting: Neurology

## 2016-12-02 ENCOUNTER — Encounter: Payer: Self-pay | Admitting: Neurology

## 2016-12-02 ENCOUNTER — Telehealth: Payer: Self-pay | Admitting: Neurology

## 2016-12-02 MED ORDER — TOPIRAMATE 25 MG PO TABS: 75.0000 mg | ORAL_TABLET | Freq: Two times a day (BID) | ORAL | 2 refills | Status: DC

## 2016-12-02 NOTE — Telephone Encounter (Signed)
RX sent to pharmacy  

## 2016-12-02 NOTE — Addendum Note (Signed)
Addended byAnnamaria Helling on: 12/02/2016 03:21 PM   Modules accepted: Orders

## 2016-12-02 NOTE — Telephone Encounter (Signed)
Patient states that she needs her topamax75 mg called into the [facility] on [address]. She will take her last dose tonight she takes twice a day please call patient at [phone]

## 2016-12-03 MED ORDER — TOPIRAMATE 50 MG PO TABS: 75.0000 mg | ORAL_TABLET | Freq: Two times a day (BID) | ORAL | 2 refills | Status: DC

## 2016-12-03 NOTE — Telephone Encounter (Signed)
Spoke with Dr. Tomi Likens and he advised we could prescribe 50 mg tablets 1.5 twice daily if the patient is comfortable with this.  Spoke with patient and she is okay with that plan. New RX sent to pharmacy.

## 2017-01-17 IMAGING — DX DG CHEST 2V
2 series · 2 of 2 positions shown · non-contrast
Comparison: CXR exams dating back through 05/17/2013

CLINICAL DATA: Cough for months now with wheezing

EXAM:
CHEST  2 VIEW

[chest pa]
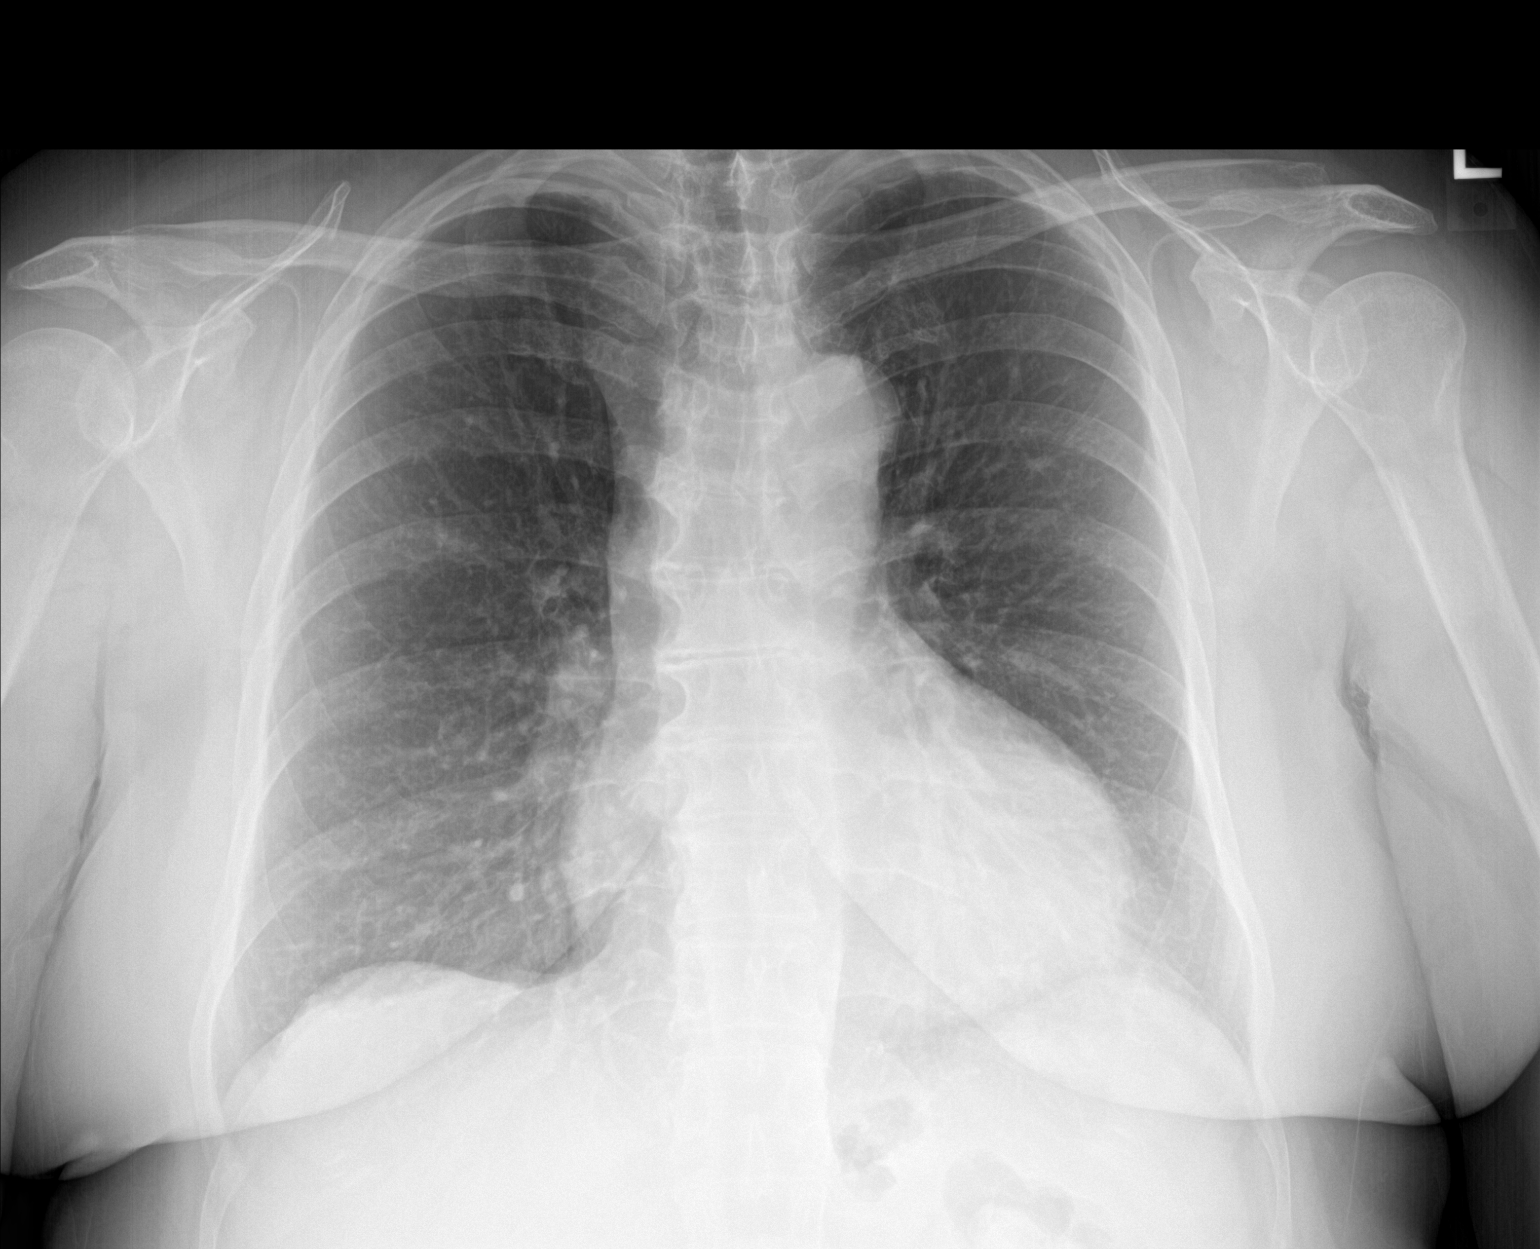

[chest lat]
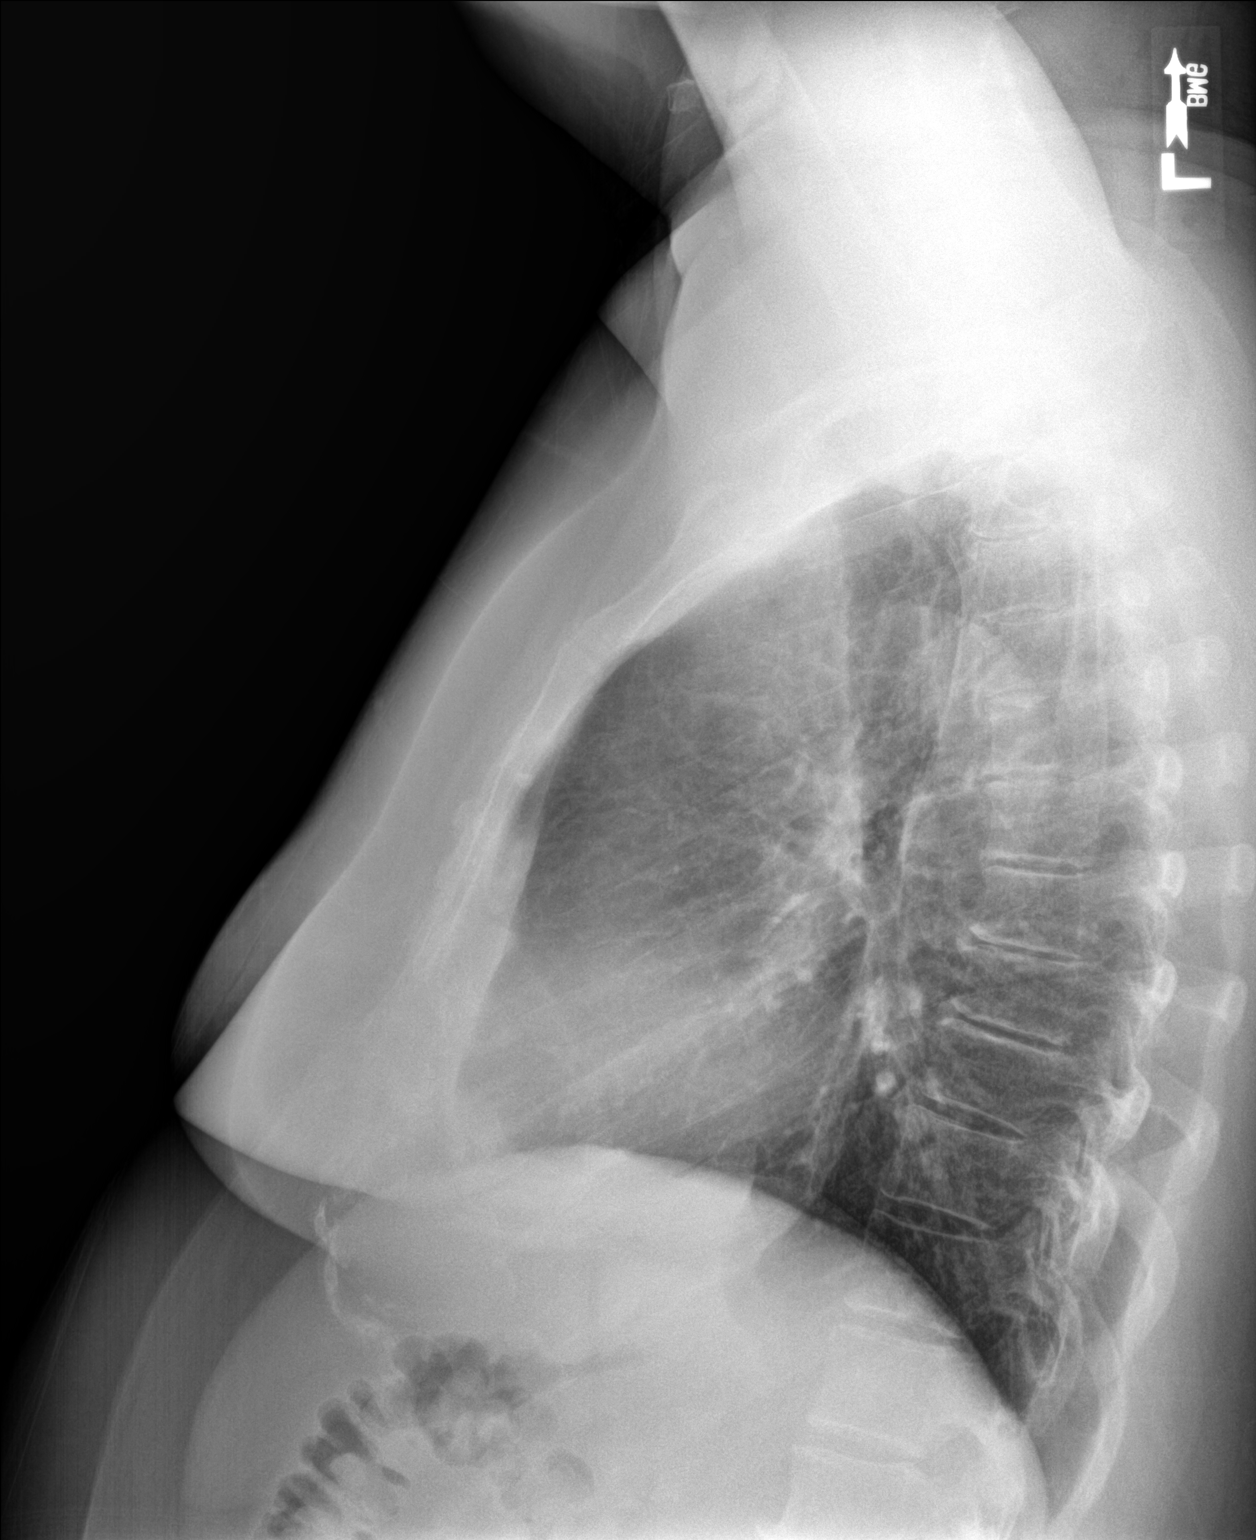

[2 of 2 positions shown; findings below may reference images not displayed]

FINDINGS: The heart is borderline enlarged. The aorta is not aneurysmal in
appearance. No pneumonic consolidation or CHF. No effusion or
pneumothorax. Small ovoid density in the left upper lobe is stable
dating back to 5113 and may represent a small vascular summation
shadow. No acute osseous abnormality.
IMPRESSION: No active cardiopulmonary disease.

## 2017-01-17 IMAGING — DX DG FACIAL BONES COMPLETE 3+V
3 series · 3 of 3 positions shown · non-contrast
Comparison: None.

CLINICAL DATA: 65 y/o F; left-sided facial pain. No history of
trauma.

EXAM:
FACIAL BONES COMPLETE 3+V

[skull pa]
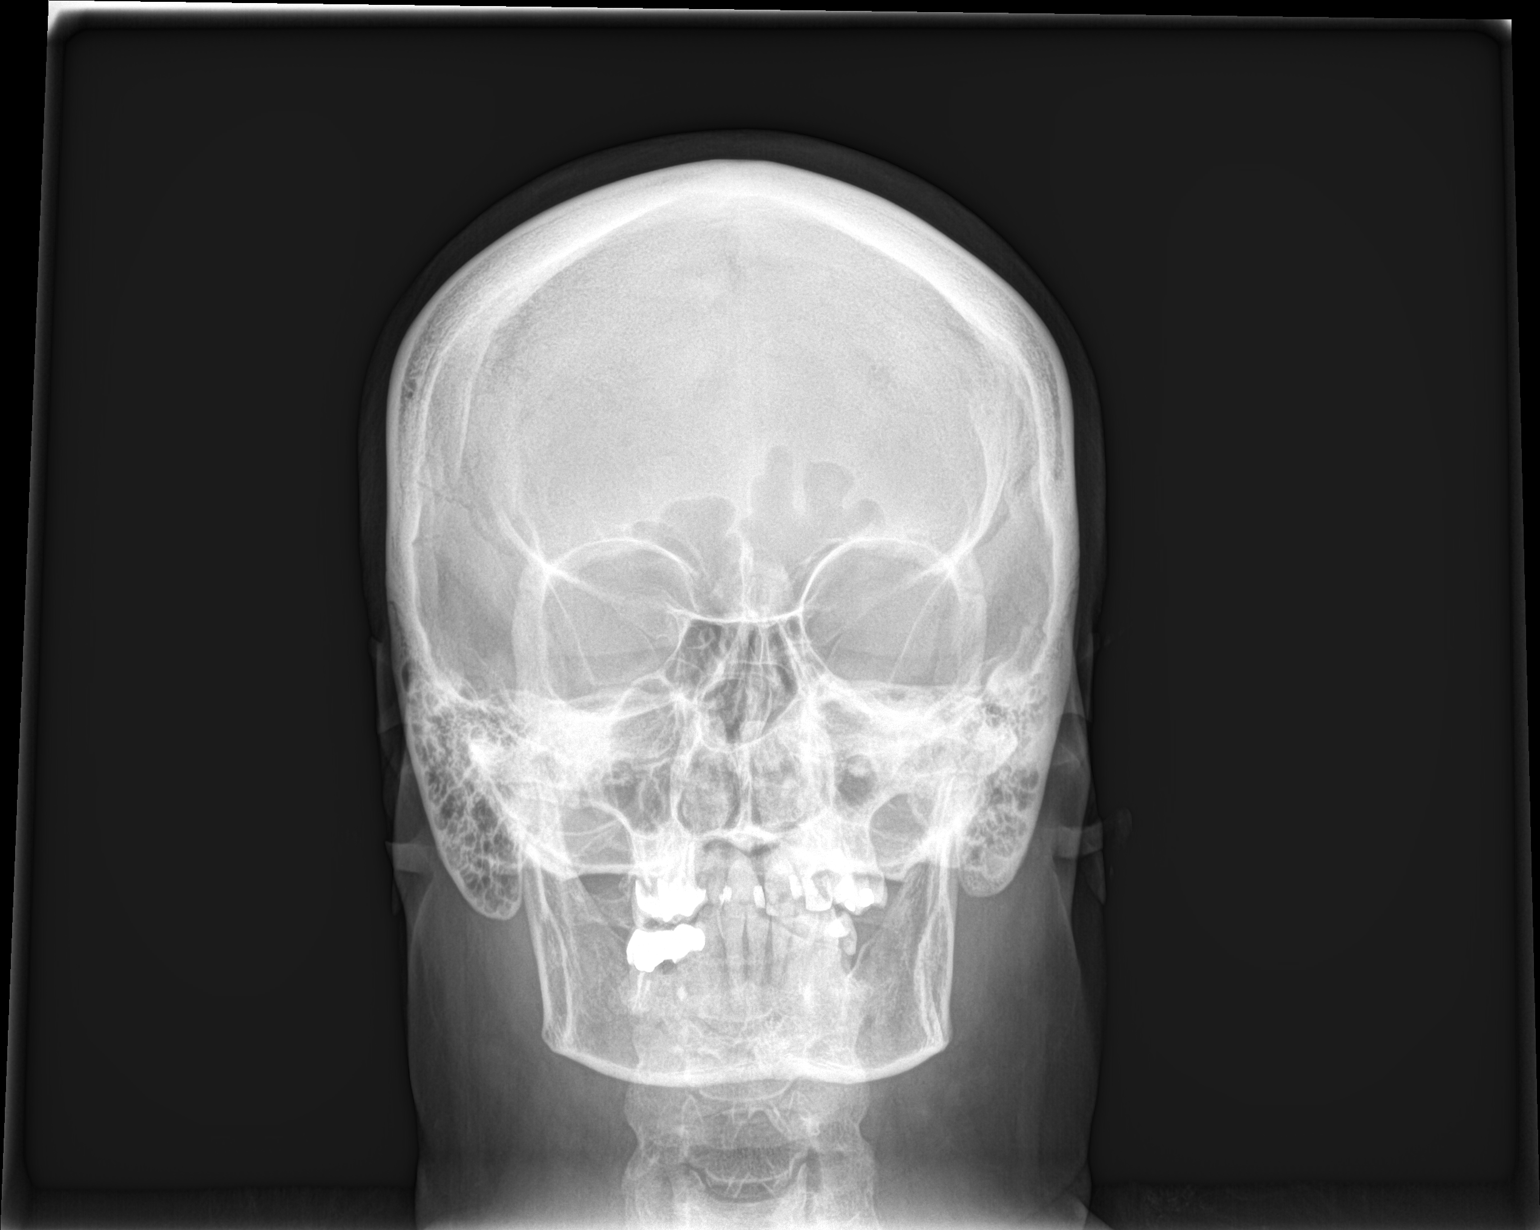

[skull waters]
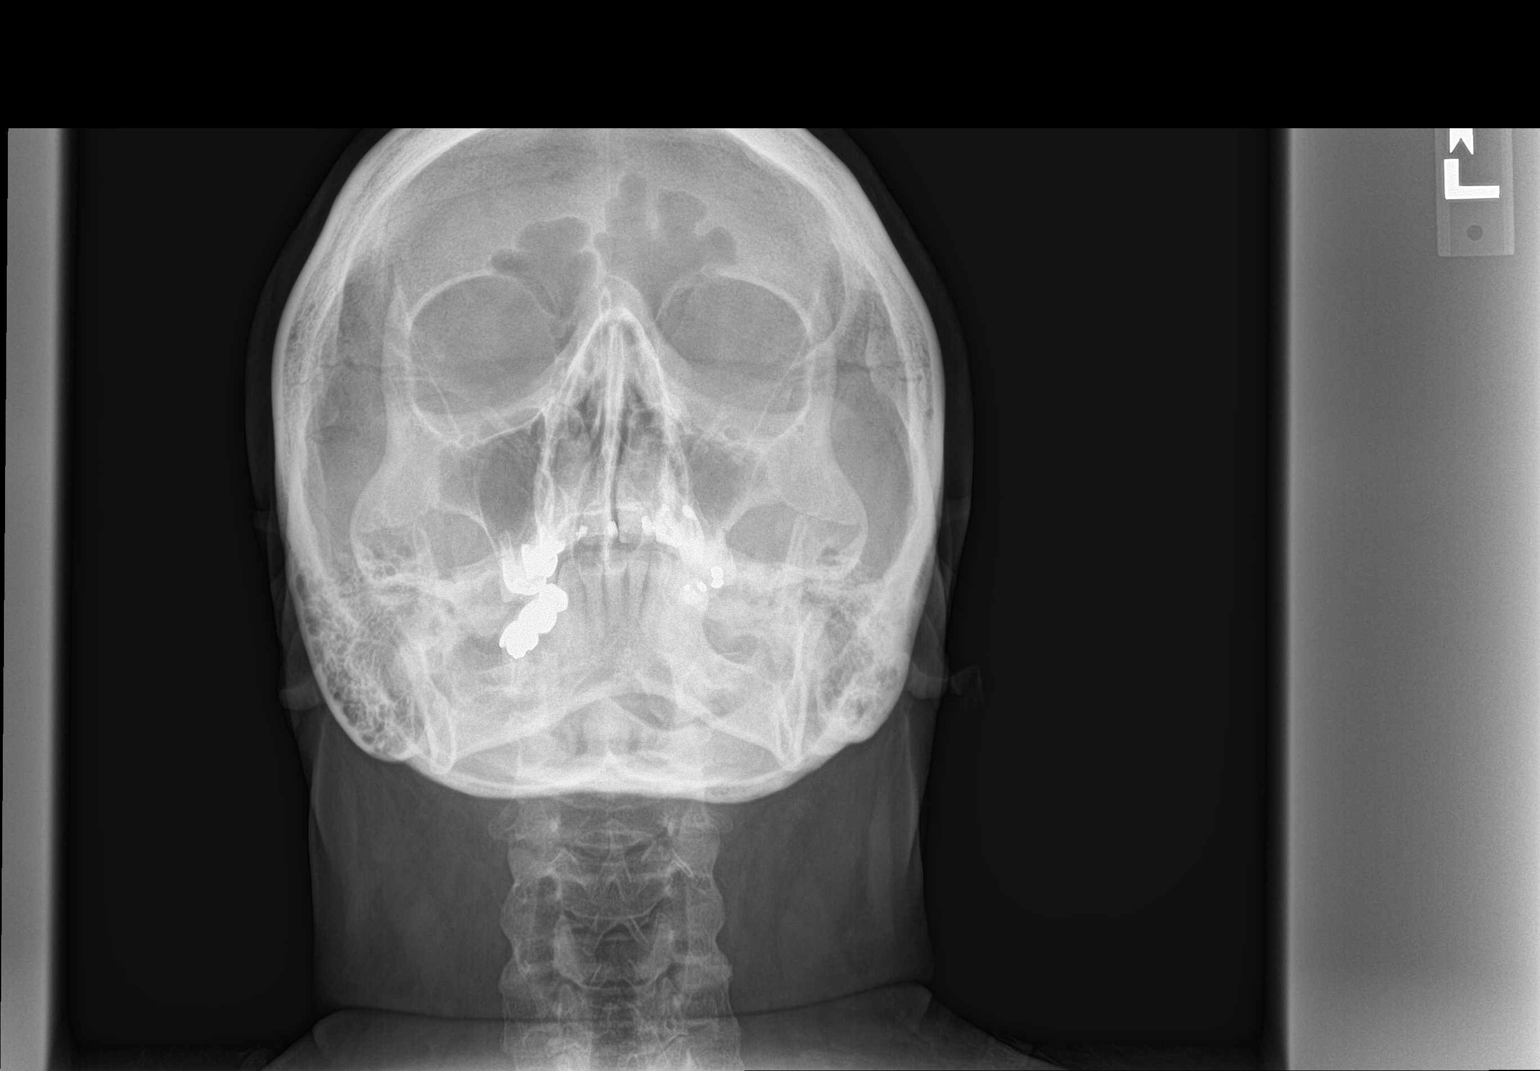

[skull lat]
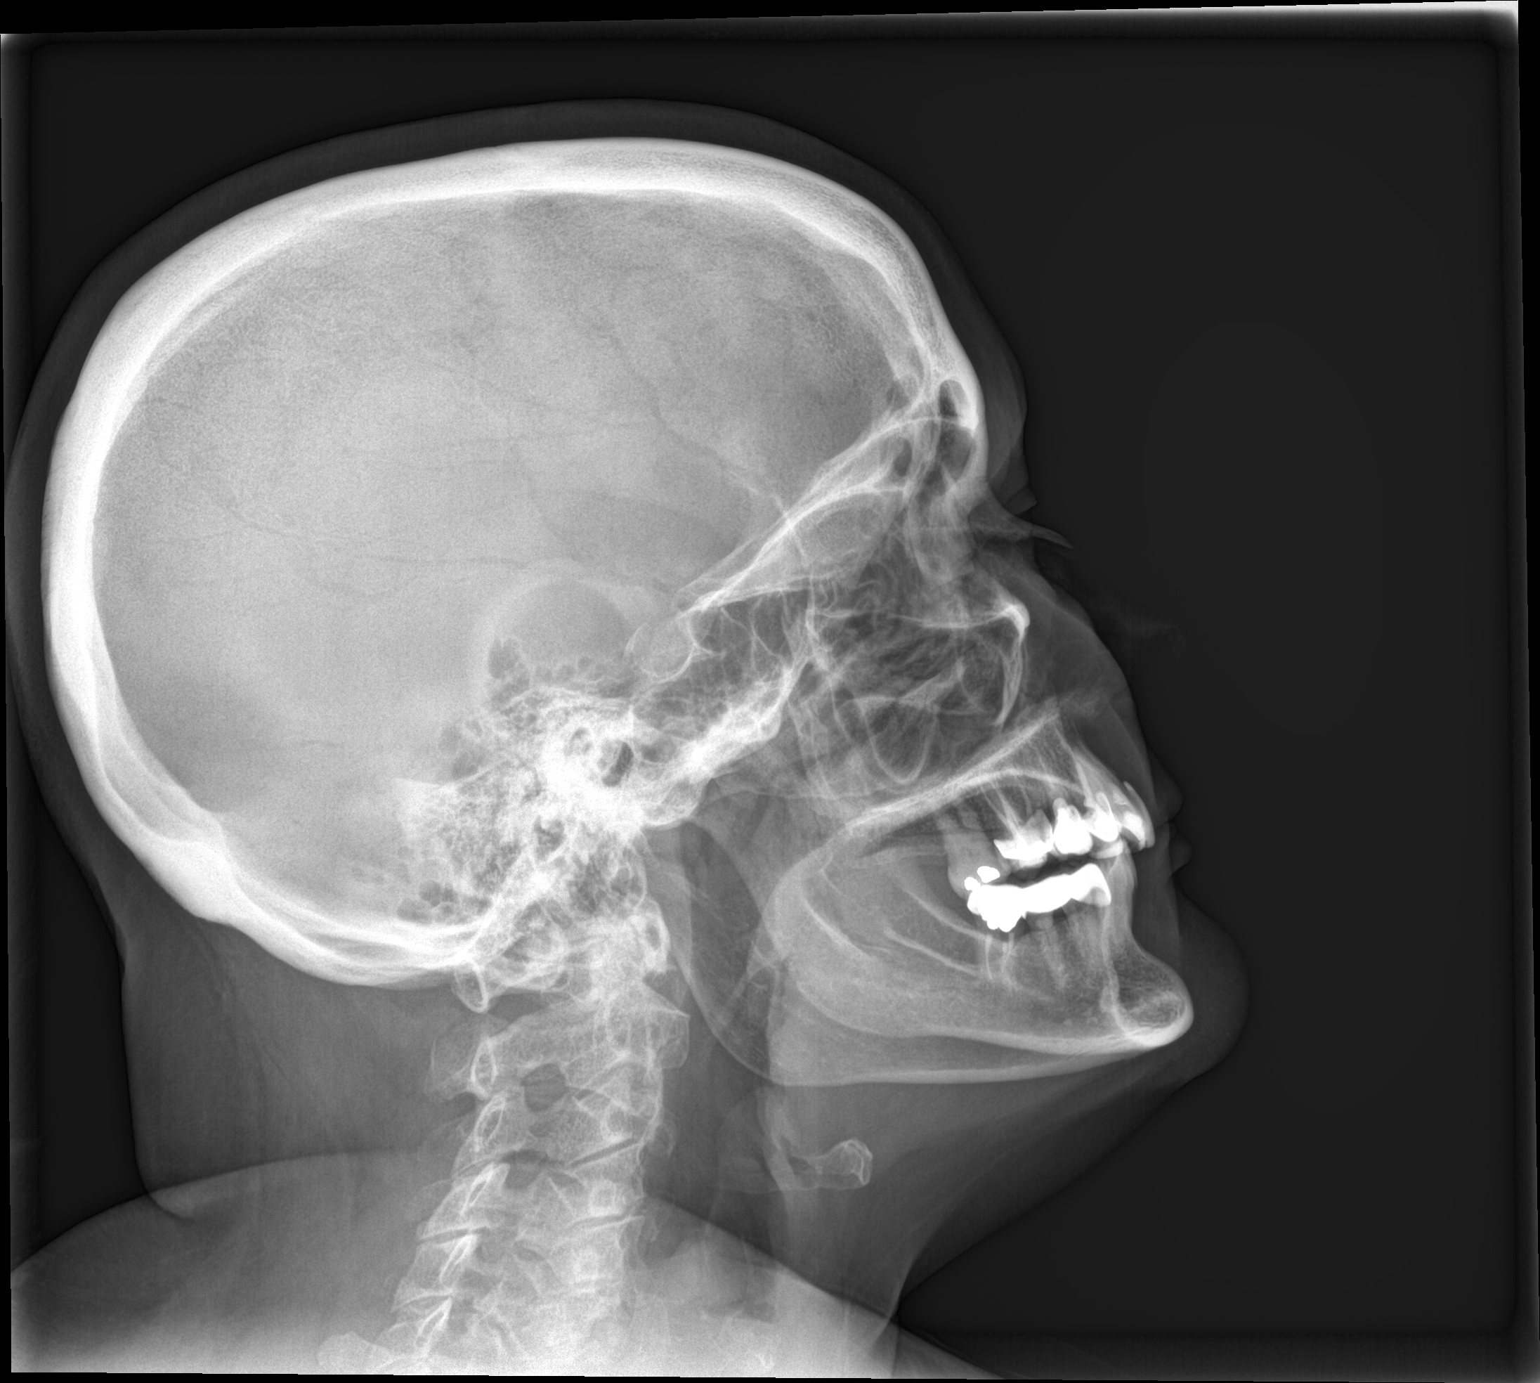

[3 of 3 positions shown; findings below may reference images not displayed]

FINDINGS: There is no evidence of fracture or other significant bone
abnormality. No orbital emphysema or sinus air-fluid levels are
seen.
IMPRESSION: Negative.

By: Yisley Lameda M.D.

## 2017-01-18 ENCOUNTER — Other Ambulatory Visit: Payer: Self-pay

## 2017-01-18 ENCOUNTER — Encounter: Payer: Self-pay | Admitting: Neurology

## 2017-01-18 MED ORDER — TOPIRAMATE 100 MG PO TABS: 100.0000 mg | ORAL_TABLET | Freq: Two times a day (BID) | ORAL | 2 refills | Status: DC

## 2017-02-08 ENCOUNTER — Other Ambulatory Visit: Payer: Self-pay | Admitting: Neurology

## 2017-02-08 ENCOUNTER — Other Ambulatory Visit: Payer: Self-pay | Admitting: Family Medicine

## 2017-02-08 MED ORDER — TOPIRAMATE 100 MG PO TABS: 100.0000 mg | ORAL_TABLET | Freq: Two times a day (BID) | ORAL | 1 refills | Status: DC

## 2017-02-08 NOTE — Telephone Encounter (Signed)
Last filled 11/03/16 #30 w/1RF Last ov 09/29/16 Next ov due around 03/30/17 No contract/ No uds  Looks like she does not take every day.  Would you like for [place] to do contract and UDS?

## 2017-02-08 NOTE — Telephone Encounter (Signed)
rx faxed to pharmacy

## 2017-02-10 ENCOUNTER — Encounter: Payer: Self-pay | Admitting: Neurology

## 2017-02-10 MED ORDER — TOPIRAMATE 50 MG PO TABS: 100.0000 mg | ORAL_TABLET | Freq: Two times a day (BID) | ORAL | 1 refills | Status: DC

## 2017-02-11 ENCOUNTER — Telehealth: Payer: Self-pay

## 2017-02-11 NOTE — Telephone Encounter (Signed)
Approved. Event number [id number]. Until 02/11/18

## 2017-02-11 NOTE — Telephone Encounter (Signed)
Topamax P.A. Initiated via cover my meds. KEY [id number]

## 2017-03-12 DIAGNOSIS — B372 Candidiasis of skin and nail: Secondary | ICD-10-CM | POA: Diagnosis not present

## 2017-03-29 ENCOUNTER — Encounter: Payer: Self-pay | Admitting: Neurology

## 2017-03-30 ENCOUNTER — Ambulatory Visit: Payer: Medicare Other | Admitting: Neurology

## 2017-04-02 ENCOUNTER — Ambulatory Visit: Payer: Medicare Other | Admitting: Neurology

## 2017-04-09 ENCOUNTER — Ambulatory Visit (INDEPENDENT_AMBULATORY_CARE_PROVIDER_SITE_OTHER): Payer: Medicare Other | Admitting: Neurology

## 2017-04-09 ENCOUNTER — Encounter: Payer: Self-pay | Admitting: Neurology

## 2017-04-09 VITALS — BP 140/80 | HR 64 | Ht 62.0 in | Wt 192.1 lb

## 2017-04-09 DIAGNOSIS — G43709 Chronic migraine without aura, not intractable, without status migrainosus: Secondary | ICD-10-CM | POA: Diagnosis not present

## 2017-04-09 MED ORDER — RIZATRIPTAN BENZOATE 10 MG PO TABS: ORAL_TABLET | ORAL | 0 refills | Status: DC

## 2017-04-09 NOTE — Patient Instructions (Addendum)
1.  Please contact Medicare to find out what your cost would be for Botox.  I think it would be a great option.  Contact me if you wish to proceed. 2.  Take rizatriptan (Maxalt) 10mg  at the earliest onset of migraine.  May repeat dose once in 2 hours if needed.  Do not exceed 2 tablets in 24 hours. 3.  Limit use of pain relievers (including tylenol) to no more than 2 days out of the week to prevent rebound headache 4.  Continue topiramate 100mg  twice daily 5.  Follow up in 5 months.

## 2017-04-09 NOTE — Progress Notes (Signed)
NEUROLOGY FOLLOW UP OFFICE NOTE  Ebony Garcia [id number]  HISTORY OF PRESENT ILLNESS: Ebony Garcia is a 66 year old right-handed woman with hypertension, CHF, renal calculi, depression, and hyperlipidemia and who follows up for migraine headache.    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  Since January, she has had increased migraines. Intensity:  7/10 Duration:  2 hours to 2 days Frequency:  21 headache days a month Current NSAIDS:  Toradol Current analgesics:  no. Current triptans:  no Current anti-emetic:  Phenergan Current muscle relaxants:  no Current anti-anxiolytic:  Xanax 1mg  Current sleep aide:  Xanax 1mg  Current Antihypertensive medications:  Toprol XL 50mg , Lasix Current Antidepressant medications:  Prozac 20mg  Current Anticonvulsant medications:  topiramate 100mg  BID, gabapentin 300mg  at night  She has a history of CHF.  She was told a long time ago that triptans were contraindicated.  3D echo from 05/01/13 demonstrated LV EF 60-65%with normal left ventricular cavity size, normal wall thickness, and normal systolic function.  Nuclear stress test from 02/19/15 was normal with no ischemia.  ECG from 11/03/16 was normal.  She reports that there was even doubt that she even has CHF.    Depression continues to be a major issue.  She feels that she has nobody.  She denies suicidal ideation.  God gives her hope.  She has not been exercising.   HISTORY: Onset:  Since late 15s Location: usually left sided, periorbital, and back of head.  More recently, holocephalic Quality:  Sharp, aching pressure.  Non-throbbing Initial intensity:  7/10 Associated symptoms:  Nausea, photophobia and phonophobia.  No visual disturbance, osmophobia or vomiting Aura:  no Initial duration:  3 days Initial frequency:  25 headache days per month Triggers/exacerbating factors:  stress Relieving factors:  Cold mask, heating pack on  neck and shoulders   Past NSAIDs:  naproxen (ineffective), indomethacin, toradol tablet (ineffective), Toradol 60mg  IM (effective), Cambia (side effects) Past analgesics:  Tylenol (ineffective), Excedrin (ineffective), Lidocaine nasal drops (effective but caused hives), tramadol (side effects), Midrin (effective but made her sleepy) Past triptans/ergots:  sumatriptan 100mg , Zomig 5mg  (effective), Frova, Relpax, DHE (effective)   Past antiemetic:  Reglan Past anxiolytics:  Vicodin Past muscle relaxants:  cyclobenzaprine Past antihypertensives:  propranolol (ineffective), atenolol (ineffective), lisinopril, losartan, HCTZ Past antidepressants:  amitriptyline (side effects), venlafaxine (ineffective) Past antiepileptics:  Depakote (hair loss), lamotrigine (side effects), zonisamide (side effects) Past vitamins and supplements:  magnesium (ineffective), feverfew (ineffective) Other past therapy:  Botox (one round, flu like symptoms), occipital nerve blocks, trigger point injections, alternating heat/ice   Family history of headache:  Mother, grandmother, great-aunt, cousins.   08/16/08 MRI Brain w/wo performed for "explosive headaches": nonspecific punctate hyperintensities in the subcortical white matter. 08/16/08 MRA Head: Unremarkable.  There is mild attenuation in the vertebrobasilar junction and proximal basilar artery which is likely artifact.  PAST MEDICAL HISTORY: Past Medical History:  Diagnosis Date  . Cancer ([place])   . CHF (congestive heart failure) ([place])    dystolic heart dysfunction  . Chronic kidney disease   . Depression   . Depression with anxiety   . Diabetes mellitus without complication ([place])    Diet controlled  . Hypertension   . Migraines   . Panic attacks     MEDICATIONS: Current Outpatient Prescriptions on File Prior to Visit  Medication Sig Dispense Refill  . ALPRAZolam (XANAX) 1 MG tablet TAKE ONE TABLET BY MOUTH AT BEDTIME  AS NEEDED FOR SLEEP 30 tablet 1  .  aspirin EC 81 MG tablet Take 1 tablet (81 mg total) by mouth daily. 90 tablet 3  . azithromycin (ZITHROMAX) 250 MG tablet Take 2 pills by mouth on day 1, then 1 pill by mouth per day on days 2 through 5. 6 tablet 0  . benzonatate (TESSALON) 100 MG capsule Take 1 capsule (100 mg total) by mouth 3 (three) times daily as needed for cough. 20 capsule 0  . benzoyl peroxide-erythromycin (BENZAMYCIN) gel Apply topically 2 (two) times daily as needed.    Marland Kitchen estradiol (ESTRACE) 0.5 MG tablet Take 1 tablet by mouth daily.    . fluocinonide gel (LIDEX) 8.18 % Apply 1 application topically daily.    Marland Kitchen FLUoxetine (PROZAC) 20 MG capsule Take 1 capsule (20 mg total) by mouth daily. 30 capsule 6  . furosemide (LASIX) 20 MG tablet Take 1 tablet (20 mg total) by mouth daily. 90 tablet 3  . ketorolac (TORADOL) 10 MG tablet TAKE ONE TABLET BY MOUTH EVERY 6 HOURS AS NEEDED 30 tablet 0  . metoprolol succinate (TOPROL-XL) 50 MG 24 hr tablet Take 1 tablet (50 mg total) by mouth daily. Take with or immediately following a meal. 90 tablet 3  . nitroGLYCERIN (NITROSTAT) 0.4 MG SL tablet Place 1 tablet (0.4 mg total) under the tongue every 5 (five) minutes as needed for chest pain. 25 tablet 3  . predniSONE (DELTASONE) 10 MG tablet 10mg  tablets. Take 6tabs x1day, then 5tabs x1day, then 4tabs x1day, then 3tabs x1day, then 2tabs x1day, then 1tab x1day, then STOP 21 tablet 0  . promethazine (PHENERGAN) 25 MG tablet Take 25 mg by mouth as needed.    . topiramate (TOPAMAX) 50 MG tablet Take 2 tablets (100 mg total) by mouth 2 (two) times daily. 120 tablet 1  . triamcinolone ointment (KENALOG) 0.1 % Apply topically as needed     No current facility-administered medications on file prior to visit.     ALLERGIES: Allergies  Allergen Reactions  . Amoxicillin-Pot Clavulanate Diarrhea  . Antihistamines, Diphenhydramine-Type Other (See Comments)    Other Reaction: makes pt nervous  . Diphenhydramine Hcl Other (See Comments)     Other Reaction: makes pt nervous  . Amoxicillin-Pot Clavulanate     REACTION: DIARRHEA  . Clindamycin     She has not taken but per patient  "her mother had a reaction and she does not want it ever"  . Hydrocodone-Acetaminophen     REACTION: sensitive to  but can take for extreme pain per patient  . Propoxyphene Hcl     REACTION: nausea  . Propoxyphene N-Acetaminophen     REACTION: nausea  . Sulfa Antibiotics     Unknown   . Tramadol Other (See Comments)    Pt does know what kind of effect  . Lisinopril     Restless , irregular sleep    FAMILY HISTORY: Family History  Problem Relation Age of Onset  . Crohn's disease Mother   . Atrial fibrillation Mother   . Heart failure Mother   . Bladder Cancer Father   . Prostate cancer Father   . Cancer Father     bladder  . Bipolar disorder Brother   . Bipolar disorder Sister   . Heart attack Maternal Grandmother     >65  . Diabetes Neg Hx   . Stroke Neg Hx     SOCIAL HISTORY: Social History   Social History  . Marital status: Divorced  Spouse name: N/A  . Number of children: 3  . Years of education: N/A   Occupational History  .  Unemployed   Social History Main Topics  . Smoking status: Never Smoker  . Smokeless tobacco: Never Used  . Alcohol use No  . Drug use: No  . Sexual activity: Yes    Partners: Male   Other Topics Concern  . Not on file   Social History Narrative   Lives alone.            REVIEW OF SYSTEMS: Constitutional: No fevers, chills, or sweats, no generalized fatigue, change in appetite Eyes: No visual changes, double vision, eye pain Ear, nose and throat: No hearing loss, ear pain, nasal congestion, sore throat Cardiovascular: No chest pain, palpitations Respiratory:  No shortness of breath at rest or with exertion, wheezes GastrointestinaI: No nausea, vomiting, diarrhea, abdominal pain, fecal incontinence Genitourinary:  No dysuria, urinary retention or frequency Musculoskeletal:  No  neck pain, back pain Integumentary: No rash, pruritus, skin lesions Neurological: as above Psychiatric: No depression, insomnia, anxiety Endocrine: No palpitations, fatigue, diaphoresis, mood swings, change in appetite, change in weight, increased thirst Hematologic/Lymphatic:  No purpura, petechiae. Allergic/Immunologic: no itchy/runny eyes, nasal congestion, recent allergic reactions, rashes  PHYSICAL EXAM: Vitals:   04/09/17 1452  BP: 140/80  Pulse: 64   General: No acute distress.  Patient appears well-groomed.  Head:  Normocephalic/atraumatic Eyes:  Fundi examined but not visualized Neurological Exam: alert and oriented to person, place, and time. Attention span and concentration intact, recent and remote memory intact, fund of knowledge intact.  Speech fluent and not dysarthric, language intact.  CN II-XII intact. Bulk and tone normal, muscle strength 5/5 throughout.  Sensation to light touch  intact.  Deep tendon reflexes 2+ throughout.  Finger to nose testing intact.  Gait normal, Romberg negative.  IMPRESSION: Chronic migraine  PLAN: 1.  I think she should give Botox another try.  She is concerned about out-of-pocket cost.  I advised that she contact Medicare to find out what her cost would be and to contact us if she wishes to proceed. 2.  I think she should be able to use triptans.  She does not have ischemic heart disease.  2D echo from 2014 and nuclear stress test from 2016 and ECG from 2017 were unremarkable.  We will try Maxalt 10mg , limited to no more than 2 days out of the week 3.  Increase exercise 4.  Follow up in 5 months or as needed.  25 minutes spent face to face with patient, over 50% spent discussing management   Metta Clines, DO  CC: Roma Schanz, DO

## 2017-04-12 ENCOUNTER — Other Ambulatory Visit: Payer: Self-pay | Admitting: Family Medicine

## 2017-04-12 ENCOUNTER — Encounter: Payer: Self-pay | Admitting: Family Medicine

## 2017-04-13 NOTE — Telephone Encounter (Signed)
Have sent already to provider this request

## 2017-04-13 NOTE — Telephone Encounter (Signed)
Faxed hardcopy for Alprazolam to [facility] on [address]

## 2017-04-17 ENCOUNTER — Encounter: Payer: Self-pay | Admitting: Neurology

## 2017-04-23 LAB — HEMOGLOBIN A1C: Hemoglobin A1C: 5.5

## 2017-04-27 ENCOUNTER — Ambulatory Visit: Payer: Medicare Other | Admitting: Neurology

## 2017-04-28 ENCOUNTER — Telehealth: Payer: Self-pay | Admitting: *Deleted

## 2017-04-28 NOTE — Telephone Encounter (Signed)
Patient returned call. AWV scheduled w/ [facility] Coach prior to PCP follow-up on 05/14/17.

## 2017-04-28 NOTE — Telephone Encounter (Signed)
Called patient and left message to return call to schedule AWV w/ Health Coach and follow-up OV w/ PCP.

## 2017-04-29 DIAGNOSIS — L570 Actinic keratosis: Secondary | ICD-10-CM | POA: Diagnosis not present

## 2017-04-29 DIAGNOSIS — B958 Unspecified staphylococcus as the cause of diseases classified elsewhere: Secondary | ICD-10-CM | POA: Diagnosis not present

## 2017-04-29 DIAGNOSIS — L304 Erythema intertrigo: Secondary | ICD-10-CM | POA: Diagnosis not present

## 2017-05-14 ENCOUNTER — Ambulatory Visit (INDEPENDENT_AMBULATORY_CARE_PROVIDER_SITE_OTHER): Payer: Medicare Other | Admitting: Family Medicine

## 2017-05-14 ENCOUNTER — Encounter: Payer: Self-pay | Admitting: Family Medicine

## 2017-05-14 VITALS — BP 118/70 | HR 57 | Temp 98.0°F | Resp 16 | Ht 62.0 in | Wt 190.2 lb

## 2017-05-14 DIAGNOSIS — I10 Essential (primary) hypertension: Secondary | ICD-10-CM

## 2017-05-14 DIAGNOSIS — Z114 Encounter for screening for human immunodeficiency virus [HIV]: Secondary | ICD-10-CM

## 2017-05-14 DIAGNOSIS — Z1159 Encounter for screening for other viral diseases: Secondary | ICD-10-CM

## 2017-05-14 DIAGNOSIS — Z0000 Encounter for general adult medical examination without abnormal findings: Secondary | ICD-10-CM

## 2017-05-14 DIAGNOSIS — F418 Other specified anxiety disorders: Secondary | ICD-10-CM | POA: Diagnosis not present

## 2017-05-14 DIAGNOSIS — E785 Hyperlipidemia, unspecified: Secondary | ICD-10-CM | POA: Diagnosis not present

## 2017-05-14 LAB — COMPREHENSIVE METABOLIC PANEL
ALT: 12 U/L (ref 0–35)
AST: 13 U/L (ref 0–37)
Albumin: 3.8 g/dL (ref 3.5–5.2)
Alkaline Phosphatase: 85 U/L (ref 39–117)
BUN: 14 mg/dL (ref 6–23)
CO2: 23 mEq/L (ref 19–32)
Calcium: 8.8 mg/dL (ref 8.4–10.5)
Chloride: 111 mEq/L (ref 96–112)
Creatinine, Ser: 0.89 mg/dL (ref 0.40–1.20)
GFR: 67.95 mL/min (ref >60.00)
Glucose, Bld: 126 mg/dL — ABNORMAL HIGH (ref 70–99)
Potassium: 3.8 mEq/L (ref 3.5–5.1)
Sodium: 139 mEq/L (ref 135–145)
Total Bilirubin: 0.4 mg/dL (ref 0.2–1.2)
Total Protein: 6.5 g/dL (ref 6.0–8.3)

## 2017-05-14 LAB — LIPID PANEL
Cholesterol: 154 mg/dL (ref 0–200)
HDL: 42.9 mg/dL (ref >39.00)
LDL Cholesterol: 80 mg/dL (ref 0–99)
NonHDL: 110.78
Total CHOL/HDL Ratio: 4
Triglycerides: 154 mg/dL — ABNORMAL HIGH (ref 0.0–149.0)
VLDL: 30.8 mg/dL (ref 0.0–40.0)

## 2017-05-14 LAB — CBC
HCT: 40.2 % (ref 36.0–46.0)
Hemoglobin: 13.1 g/dL (ref 12.0–15.0)
MCHC: 32.6 g/dL (ref 30.0–36.0)
MCV: 92.4 fl (ref 78.0–100.0)
Platelets: 72 10*3/uL — ABNORMAL LOW (ref 150.0–400.0)
RBC: 4.35 Mil/uL (ref 3.87–5.11)
RDW: 14.3 % (ref 11.5–15.5)
WBC: 7.2 10*3/uL (ref 4.0–10.5)

## 2017-05-14 NOTE — Assessment & Plan Note (Signed)
Tolerating statin, encouraged heart healthy diet, avoid trans fats, minimize simple carbs and saturated fats. Increase exercise as tolerated 

## 2017-05-14 NOTE — Progress Notes (Signed)
Subjective:   Ebony Garcia is a 66 y.o. female who presents for Medicare Annual (Subsequent) preventive examination.  She reports today that she has two 'very concerning' areas of skins on either side of her buttocks for which she is seeing dermatology-Dr. Haverstock. Treating provider feels this is a staph infection, but pt is concerned it could be cancer. She has completed a course of atbx and is no better, so she is planning to follow-up w/ derm for skin biopsy.  Otherwise, she is feeling 'great.'She feels her migraines are better and her 'heart is doing well.'  She lost her job 2 months ago and is going through a hard time financially. She also has some local family members that she is not on good terms with and she 'wishes the situation was different.' She notes chronic depression and does not feel that counseling would be helpful for her.  She is helping take care of her 46 y/o mother who lives locally and had a bad fall a few months ago. Pt helps pick up prescriptions, run errands, and take care of her dog.  Review of Systems:  No ROS.  Medicare Wellness Visit.  Cardiac Risk Factors include: advanced age (>29men, >59 women);obesity (BMI >30kg/m2);hypertension;sedentary lifestyle  Sleep patterns: no sleep issues.   Home Safety/Smoke Alarms: Feels safe in home. Smoke alarms in place.  Living environment; residence and Firearm Safety: Lives alone on first floor. apartment, no firearms. Seat Belt Safety/Bike Helmet: Wears seat belt.   Counseling:   Eye Exam- Follows w/ Dr.  Milas Kocher- Follows w/ Dr. Olena Heckle in [place].  Female:   Pap-N/A, hysterectomy       Mammo- last was done in 2017 w/ Dr. Jerrilyn Cairo office (GYN) Dexa scan- Not on file.        CCS- last 12/12/09. 2 polyps removed. 5 year recall.        Objective:     Vitals: BP 118/70   Pulse (!) 57   Temp 98 F (36.7 C)   Resp 16   Ht 5\' 2"  (1.575 m)   Wt 190 lb 3.2 oz (86.3 kg)   SpO2 98%   BMI 34.79 kg/m    Body mass index is 34.79 kg/m.   Tobacco History  Smoking Status  . Never Smoker  Smokeless Tobacco  . Never Used     Counseling given: Not Answered   Past Medical History:  Diagnosis Date  . Cancer ([place])   . CHF (congestive heart failure) ([place])    dystolic heart dysfunction  . Chronic kidney disease   . Depression   . Depression with anxiety   . Diabetes mellitus without complication ([place])    Diet controlled  . Hypertension   . Intertrigo   . Migraines   . Panic attacks    Past Surgical History:  Procedure Laterality Date  . ABDOMINAL HYSTERECTOMY    . BREAST SURGERY    . DILATION AND CURETTAGE OF UTERUS    . NASAL SEPTUM SURGERY    . papaloma Right   . TONSILLECTOMY AND ADENOIDECTOMY    . TOTAL ABDOMINAL HYSTERECTOMY W/ BILATERAL SALPINGOOPHORECTOMY     for fibroids and migraines  . TUBAL LIGATION     Family History  Problem Relation Age of Onset  . Crohn's disease Mother   . Atrial fibrillation Mother   . Heart failure Mother   . Bladder Cancer Father   . Cancer Father        bladder  . Bipolar disorder  Brother   . Bipolar disorder Sister   . Heart attack Maternal Grandmother        >65  . Cancer Maternal Aunt   . Colon cancer Maternal Uncle   . Cancer Paternal Grandfather   . Cancer Maternal Aunt   . Cancer Cousin   . Anemia Cousin   . Diabetes Neg Hx   . Stroke Neg Hx    History  Sexual Activity  . Sexual activity: Yes  . Partners: Male    Outpatient Encounter Prescriptions as of 05/14/2017  Medication Sig  . ALPRAZolam (XANAX) 1 MG tablet TAKE 1 TABLET BY MOUTH AT BEDTIME AS NEEDED FOR SLEEP  . aspirin EC 81 MG tablet Take 1 tablet (81 mg total) by mouth daily.  Marland Kitchen estradiol (ESTRACE) 0.5 MG tablet Take 0.5 mg by mouth daily.   Marland Kitchen FLUoxetine (PROZAC) 20 MG capsule Take 1 capsule (20 mg total) by mouth daily.  . furosemide (LASIX) 20 MG tablet Take 1 tablet (20 mg total) by mouth daily.  Marland Kitchen ketorolac (TORADOL) 10 MG tablet TAKE ONE TABLET  BY MOUTH EVERY 6 HOURS AS NEEDED  . metoprolol succinate (TOPROL-XL) 50 MG 24 hr tablet Take 1 tablet (50 mg total) by mouth daily. Take with or immediately following a meal.  . nitroGLYCERIN (NITROSTAT) 0.4 MG SL tablet Place 1 tablet (0.4 mg total) under the tongue every 5 (five) minutes as needed for chest pain.  . promethazine (PHENERGAN) 25 MG tablet Take 25 mg by mouth as needed.  . rizatriptan (MAXALT) 10 MG tablet Take 1 tablet earliest onset of headache.  May repeat once in 2 hours if needed  . topiramate (TOPAMAX) 50 MG tablet Take 2 tablets (100 mg total) by mouth 2 (two) times daily.  Marland Kitchen triamcinolone ointment (KENALOG) 0.1 % Apply topically as needed  . [DISCONTINUED] rizatriptan (MAXALT) 10 MG tablet Take 1 tablet by mouth as needed for migraine.  . [DISCONTINUED] azithromycin (ZITHROMAX) 250 MG tablet Take 2 pills by mouth on day 1, then 1 pill by mouth per day on days 2 through 5. (Patient not taking: Reported on 05/14/2017)  . [DISCONTINUED] benzonatate (TESSALON) 100 MG capsule Take 1 capsule (100 mg total) by mouth 3 (three) times daily as needed for cough. (Patient not taking: Reported on 05/14/2017)  . [DISCONTINUED] benzoyl peroxide-erythromycin (BENZAMYCIN) gel Apply topically 2 (two) times daily as needed.  . [DISCONTINUED] fluocinonide gel (LIDEX) 2.02 % Apply 1 application topically daily.  . [DISCONTINUED] predniSONE (DELTASONE) 10 MG tablet 10mg  tablets. Take 6tabs x1day, then 5tabs x1day, then 4tabs x1day, then 3tabs x1day, then 2tabs x1day, then 1tab x1day, then STOP (Patient not taking: Reported on 05/14/2017)   No facility-administered encounter medications on file as of 05/14/2017.     Activities of Daily Living In your present state of health, do you have any difficulty performing the following activities: 05/14/2017  Hearing? N  Vision? N  Difficulty concentrating or making decisions? N  Walking or climbing stairs? N  Dressing or bathing? N  Doing errands, shopping? N    Preparing Food and eating ? N  Using the Toilet? N  In the past six months, have you accidently leaked urine? N  Do you have problems with loss of bowel control? N  Managing your Medications? N  Managing your Finances? N  Housekeeping or managing your Housekeeping? N  Some recent data might be hidden    Patient Care Team: Carollee Herter, Alferd Apa, DO as PCP - General (Family Medicine)  Janann August, MD as Referring Physician (Dermatology) Aloha Gell, MD as Consulting Physician (Obstetrics and Gynecology) Pieter Partridge, DO as Consulting Physician (Neurology) Haverstock, Jennefer Bravo, MD as Consulting Physician (Dermatology) Minus Breeding, MD as Consulting Physician (Cardiology) Karie Georges, OD as Consulting Physician (Optometry) Eula Listen, DDS as Consulting Physician (Dentistry)    Assessment:    Physical assessment deferred to PCP.  Exercise Activities and Dietary recommendations Current Exercise Habits: The patient does not participate in regular exercise at present  Diet (meal preparation, eat out, water intake, caffeinated beverages, dairy products, fruits and vegetables): well balanced, on average, 1-2 meals per day. Notes she does not have as much appetite as she used to. Regular diet. Snacks throughout the day. Drinks coffee in the morning with 'some kind of bread.' Drinks water throughout the day.  Goals    . Increase physical activity          Walk 30 minutes daily 5 days/week.      Fall Risk Fall Risk  04/09/2017 11/03/2016 09/24/2016 07/30/2016 04/24/2016  Falls in the past year? No No No No No   Depression Screen PHQ 2/9 Scores 05/14/2017 11/03/2016 07/30/2016 04/24/2016  PHQ - 2 Score 6 6 1 4   PHQ- 9 Score 13 16 - 12     Cognitive Function MMSE - Mini Mental State Exam 05/14/2017  Orientation to time 5  Orientation to Place 5  Registration 3  Attention/ Calculation 5  Recall 3  Language- name 2 objects 2  Language- repeat 1  Language- follow 3 step  command 3  Language- read & follow direction 1  Write a sentence 1  Copy design 1  Total score 30        Immunization History  Administered Date(s) Administered  . Hepatitis B, adult 07/30/2016, 09/29/2016  . Influenza Split 09/19/2012  . Influenza Whole 11/08/2009  . Influenza,inj,Quad PF,36+ Mos 11/07/2013, 07/30/2016  . Influenza-Unspecified 09/11/2015  . PPD Test 07/30/2016  . Td 11/08/2009   Screening Tests Health Maintenance  Topic Date Due  . Hepatitis C Screening  [birth year]  . HIV Screening  09/28/1968  . COLONOSCOPY  12/12/2014  . INFLUENZA VACCINE  07/07/2017  . MAMMOGRAM  09/10/2017  . TETANUS/TDAP  11/09/2019      Plan:    Follow-up w/ PCP as directed.   I have personally reviewed and noted the following in the patient's chart:   . Medical and social history . Use of alcohol, tobacco or illicit drugs  . Current medications and supplements . Functional ability and status . Nutritional status . Physical activity . Advanced directives . List of other physicians . Vitals . Screenings to include cognitive, depression, and falls . Referrals and appointments  In addition, I have reviewed and discussed with patient certain preventive protocols, quality metrics, and best practice recommendations. A written personalized care plan for preventive services as well as general preventive health recommendations were provided to patient.     Dorrene German, RN  05/14/2017

## 2017-05-14 NOTE — Progress Notes (Signed)
Patient ID: Ebony Garcia, female   DOB: [birth year], 66 y.o.   MRN: [id number]   Subjective:  I acted as a Education administrator for [employer], DO. Raiford Noble, [place]   Patient ID: Ebony Garcia, female    DOB: 17-Mar-1951, 66 y.o.   MRN: [id number]  Chief Complaint  Patient presents with  . Medicare Wellness    w/ RN  . Depression    Pt here for follow-up.  . Immunizations    Would like 3rd Hep B today.    HPI  Patient is in today for an Annual Wellness Visit with the Health Coach to be followed up by the Provider. Patient has a Hx of migraine headaches, CHF, hyperlipidemia, depressive disorder, polydipsia, bradycardia, anxiety. Patient has no acute concerns noted at this time.  Patient Care Team: Carollee Herter, Alferd Apa, DO as PCP - General (Family Medicine) Janann August, MD as Referring Physician (Dermatology) Aloha Gell, MD as Consulting Physician (Obstetrics and Gynecology) Pieter Partridge, DO as Consulting Physician (Neurology) Haverstock, Jennefer Bravo, MD as Consulting Physician (Dermatology) Minus Breeding, MD as Consulting Physician (Cardiology) Karie Georges, OD as Consulting Physician (Optometry) Eula Listen, DDS as Consulting Physician (Dentistry)   Past Medical History:  Diagnosis Date  . Cancer ([place])   . CHF (congestive heart failure) ([place])    dystolic heart dysfunction  . Chronic kidney disease   . Depression   . Depression with anxiety   . Diabetes mellitus without complication ([place])    Diet controlled  . Hypertension   . Intertrigo   . Migraines   . Panic attacks     Past Surgical History:  Procedure Laterality Date  . ABDOMINAL HYSTERECTOMY    . BREAST SURGERY    . DILATION AND CURETTAGE OF UTERUS    . NASAL SEPTUM SURGERY    . papaloma Right   . TONSILLECTOMY AND ADENOIDECTOMY    . TOTAL ABDOMINAL HYSTERECTOMY W/ BILATERAL SALPINGOOPHORECTOMY     for fibroids and migraines  . TUBAL LIGATION      Family History  Problem Relation Age of  Onset  . Crohn's disease Mother   . Atrial fibrillation Mother   . Heart failure Mother   . Bladder Cancer Father   . Cancer Father        bladder  . Bipolar disorder Brother   . Bipolar disorder Sister   . Heart attack Maternal Grandmother        >65  . Cancer Maternal Aunt   . Colon cancer Maternal Uncle   . Cancer Paternal Grandfather   . Cancer Maternal Aunt   . Cancer Cousin   . Anemia Cousin   . Diabetes Neg Hx   . Stroke Neg Hx     Social History   Social History  . Marital status: Divorced    Spouse name: N/A  . Number of children: 3  . Years of education: N/A   Occupational History  .  Unemployed   Social History Main Topics  . Smoking status: Never Smoker  . Smokeless tobacco: Never Used  . Alcohol use No  . Drug use: No  . Sexual activity: Yes    Partners: Male   Other Topics Concern  . Not on file   Social History Narrative   Lives alone.            Outpatient Medications Prior to Visit  Medication Sig Dispense Refill  . ALPRAZolam (XANAX) 1 MG tablet TAKE 1 TABLET BY MOUTH AT BEDTIME  AS NEEDED FOR SLEEP 30 tablet 1  . aspirin EC 81 MG tablet Take 1 tablet (81 mg total) by mouth daily. 90 tablet 3  . estradiol (ESTRACE) 0.5 MG tablet Take 0.5 mg by mouth daily.     Marland Kitchen FLUoxetine (PROZAC) 20 MG capsule Take 1 capsule (20 mg total) by mouth daily. 30 capsule 6  . furosemide (LASIX) 20 MG tablet Take 1 tablet (20 mg total) by mouth daily. 90 tablet 3  . ketorolac (TORADOL) 10 MG tablet TAKE ONE TABLET BY MOUTH EVERY 6 HOURS AS NEEDED 30 tablet 0  . metoprolol succinate (TOPROL-XL) 50 MG 24 hr tablet Take 1 tablet (50 mg total) by mouth daily. Take with or immediately following a meal. 90 tablet 3  . nitroGLYCERIN (NITROSTAT) 0.4 MG SL tablet Place 1 tablet (0.4 mg total) under the tongue every 5 (five) minutes as needed for chest pain. 25 tablet 3  . promethazine (PHENERGAN) 25 MG tablet Take 25 mg by mouth as needed.    . rizatriptan (MAXALT) 10 MG  tablet Take 1 tablet earliest onset of headache.  May repeat once in 2 hours if needed 10 tablet 0  . topiramate (TOPAMAX) 50 MG tablet Take 2 tablets (100 mg total) by mouth 2 (two) times daily. 120 tablet 1  . triamcinolone ointment (KENALOG) 0.1 % Apply topically as needed    . azithromycin (ZITHROMAX) 250 MG tablet Take 2 pills by mouth on day 1, then 1 pill by mouth per day on days 2 through 5. (Patient not taking: Reported on 05/14/2017) 6 tablet 0  . benzonatate (TESSALON) 100 MG capsule Take 1 capsule (100 mg total) by mouth 3 (three) times daily as needed for cough. (Patient not taking: Reported on 05/14/2017) 20 capsule 0  . benzoyl peroxide-erythromycin (BENZAMYCIN) gel Apply topically 2 (two) times daily as needed.    . fluocinonide gel (LIDEX) 2.70 % Apply 1 application topically daily.    . predniSONE (DELTASONE) 10 MG tablet 10mg  tablets. Take 6tabs x1day, then 5tabs x1day, then 4tabs x1day, then 3tabs x1day, then 2tabs x1day, then 1tab x1day, then STOP (Patient not taking: Reported on 05/14/2017) 21 tablet 0   No facility-administered medications prior to visit.     Allergies  Allergen Reactions  . Amoxicillin-Pot Clavulanate Diarrhea  . Antihistamines, Diphenhydramine-Type Other (See Comments)    Other Reaction: makes pt nervous  . Diphenhydramine Hcl Other (See Comments)    Other Reaction: makes pt nervous  . Amoxicillin-Pot Clavulanate Diarrhea  . Clindamycin     She has not taken but per patient  "her mother had a reaction and she does not want it ever"  . Hydrocodone-Acetaminophen     REACTION: sensitive to  but can take for extreme pain per patient  . Propoxyphene Hcl Nausea Only  . Propoxyphene N-Acetaminophen Nausea Only  . Sulfa Antibiotics     Unknown   . Tramadol Other (See Comments)    Pt does know what kind of effect  . Lisinopril     Restless , irregular sleep    Review of Systems  Constitutional: Negative for chills, fever and malaise/fatigue.  HENT:  Negative for congestion and hearing loss.   Eyes: Negative for discharge.  Respiratory: Negative for cough, sputum production and shortness of breath.   Cardiovascular: Negative for chest pain, palpitations and leg swelling.  Gastrointestinal: Negative for abdominal pain, blood in stool, constipation, diarrhea, heartburn, nausea and vomiting.  Genitourinary: Negative for dysuria, frequency, hematuria and urgency.  Musculoskeletal:  Negative for back pain, falls and myalgias.  Skin: Negative for rash.  Neurological: Negative for dizziness, sensory change, loss of consciousness, weakness and headaches.  Endo/Heme/Allergies: Negative for environmental allergies. Does not bruise/bleed easily.  Psychiatric/Behavioral: Negative for depression and suicidal ideas. The patient is not nervous/anxious and does not have insomnia.        Objective:    Physical Exam  Constitutional: She is oriented to person, place, and time. She appears well-developed and well-nourished.  HENT:  Head: Normocephalic and atraumatic.  Eyes: Conjunctivae and EOM are normal.  Neck: Normal range of motion. Neck supple. No JVD present. Carotid bruit is not present. No thyromegaly present.  Cardiovascular: Normal rate, regular rhythm and normal heart sounds.   No murmur heard. Pulmonary/Chest: Effort normal and breath sounds normal. No respiratory distress. She has no wheezes. She has no rales. She exhibits no tenderness.  Musculoskeletal: She exhibits no edema.  Neurological: She is alert and oriented to person, place, and time.  Psychiatric: She has a normal mood and affect. Her behavior is normal. Judgment and thought content normal.  Nursing note and vitals reviewed.   BP 118/70   Pulse (!) 57   Temp 98 F (36.7 C)   Resp 16   Ht 5\' 2"  (1.575 m)   Wt 190 lb 3.2 oz (86.3 kg)   SpO2 98%   BMI 34.79 kg/m  Wt Readings from Last 3 Encounters:  05/14/17 190 lb 3.2 oz (86.3 kg)  04/09/17 192 lb 2 oz (87.1 kg)    11/03/16 197 lb 9.6 oz (89.6 kg)   BP Readings from Last 3 Encounters:  05/14/17 118/70  04/09/17 140/80  11/03/16 (!) 156/86     Immunization History  Administered Date(s) Administered  . Hepatitis B, adult 07/30/2016, 09/29/2016  . Influenza Split 09/19/2012  . Influenza Whole 11/08/2009  . Influenza,inj,Quad PF,36+ Mos 11/07/2013, 07/30/2016  . Influenza-Unspecified 09/11/2015  . PPD Test 07/30/2016  . Td 11/08/2009    Health Maintenance  Topic Date Due  . Hepatitis C Screening  [birth year]  . HIV Screening  09/28/1968  . COLONOSCOPY  12/12/2014  . INFLUENZA VACCINE  07/07/2017  . MAMMOGRAM  09/10/2017  . TETANUS/TDAP  11/09/2019    Lab Results  Component Value Date   WBC 9.7 11/03/2016   HGB 13.8 11/03/2016   HCT 39.3 11/03/2016   PLT 95.0 (L) 08/06/2016   GLUCOSE 88 11/03/2016   CHOL 185 08/06/2016   TRIG 176.0 (H) 08/06/2016   HDL 52.80 08/06/2016   LDLDIRECT 98.1 01/21/2011   LDLCALC 97 08/06/2016   ALT 22 08/06/2016   AST 20 08/06/2016   NA 137 11/03/2016   K 3.9 11/03/2016   CL 105 11/03/2016   CREATININE 1.03 (H) 11/03/2016   BUN 17 11/03/2016   CO2 25 11/03/2016   TSH 3.62 11/03/2016   INR 0.93 07/19/2010   HGBA1C 5.9 12/08/2012   MICROALBUR 0.7 04/18/2012    Lab Results  Component Value Date   TSH 3.62 11/03/2016   Lab Results  Component Value Date   WBC 9.7 11/03/2016   HGB 13.8 11/03/2016   HCT 39.3 11/03/2016   MCV 86.7 11/03/2016   PLT 95.0 (L) 08/06/2016   Lab Results  Component Value Date   NA 137 11/03/2016   K 3.9 11/03/2016   CO2 25 11/03/2016   GLUCOSE 88 11/03/2016   BUN 17 11/03/2016   CREATININE 1.03 (H) 11/03/2016   BILITOT 0.3 08/06/2016   ALKPHOS 67 08/06/2016  AST 20 08/06/2016   ALT 22 08/06/2016   PROT 6.5 08/06/2016   ALBUMIN 3.8 08/06/2016   CALCIUM 9.0 11/03/2016   GFR 65.56 08/06/2016   Lab Results  Component Value Date   CHOL 185 08/06/2016   Lab Results  Component Value Date   HDL 52.80  08/06/2016   Lab Results  Component Value Date   LDLCALC 97 08/06/2016   Lab Results  Component Value Date   TRIG 176.0 (H) 08/06/2016   Lab Results  Component Value Date   CHOLHDL 3 08/06/2016   Lab Results  Component Value Date   HGBA1C 5.9 12/08/2012         Assessment & Plan:   Problem List Items Addressed This Visit      Unprioritized   Depression with anxiety    Stable con't meds      Essential hypertension - Primary    Well controlled, no changes to meds. Encouraged heart healthy diet such as the DASH diet and exercise as tolerated.       Relevant Orders   CBC   Comprehensive metabolic panel   Lipid panel   Hyperlipidemia    Tolerating statin, encouraged heart healthy diet, avoid trans fats, minimize simple carbs and saturated fats. Increase exercise as tolerated       Other Visit Diagnoses    Encounter for Medicare annual wellness exam       Need for hepatitis C screening test       Relevant Orders   Hepatitis C antibody   Screening for human immunodeficiency virus       Relevant Orders   HIV antibody      I have discontinued Ms. Riss's benzoyl peroxide-erythromycin, fluocinonide gel, predniSONE, benzonatate, and azithromycin. I am also having her maintain her nitroGLYCERIN, aspirin EC, estradiol, promethazine, triamcinolone ointment, metoprolol succinate, furosemide, FLUoxetine, ketorolac, topiramate, rizatriptan, and ALPRAZolam.  Meds ordered this encounter  Medications  . DISCONTD: rizatriptan (MAXALT) 10 MG tablet    Sig: Take 1 tablet by mouth as needed for migraine.    CMA served as Education administrator during this visit. History, Physical and Plan performed by medical provider. Documentation and orders reviewed and attested to.  [name], DO

## 2017-05-14 NOTE — Assessment & Plan Note (Signed)
Stable con't meds 

## 2017-05-14 NOTE — Assessment & Plan Note (Signed)
Well controlled, no changes to meds. Encouraged heart healthy diet such as the DASH diet and exercise as tolerated.  °

## 2017-05-14 NOTE — Patient Instructions (Signed)
Ebony Garcia , Thank you for taking time to come for your Medicare Wellness Visit. I appreciate your ongoing commitment to your health goals. Please review the following plan we discussed and let me know if I can assist you in the future.   These are the goals we discussed: Goals    . Increase physical activity          Walk 30 minutes daily 5 days/week.       This is a list of the screening recommended for you and due dates:  Health Maintenance  Topic Date Due  .  Hepatitis C: One time screening is recommended by Center for Disease Control  (CDC) for  adults born from [birth year] through 1963.   03-Mar-1951  . HIV Screening  09/28/1968  . Colon Cancer Screening  12/12/2014  . Flu Shot  07/07/2017  . Mammogram  09/10/2017  . Tetanus Vaccine  11/09/2019   Preventive Care 40-64 Years, Female Preventive care refers to lifestyle choices and visits with your health care provider that can promote health and wellness. What does preventive care include?  A yearly physical exam. This is also called an annual well check.  Dental exams once or twice a year.  Routine eye exams. Ask your health care provider how often you should have your eyes checked.  Personal lifestyle choices, including: ? Daily care of your teeth and gums. ? Regular physical activity. ? Eating a healthy diet. ? Avoiding tobacco and drug use. ? Limiting alcohol use. ? Practicing safe sex. ? Taking low-dose aspirin daily starting at age 32. ? Taking vitamin and mineral supplements as recommended by your health care provider. What happens during an annual well check? The services and screenings done by your health care provider during your annual well check will depend on your age, overall health, lifestyle risk factors, and family history of disease. Counseling Your health care provider may ask you questions about your:  Alcohol use.  Tobacco use.  Drug use.  Emotional well-being.  Home and relationship  well-being.  Sexual activity.  Eating habits.  Work and work Statistician.  Method of birth control.  Menstrual cycle.  Pregnancy history.  Screening You may have the following tests or measurements:  Height, weight, and BMI.  Blood pressure.  Lipid and cholesterol levels. These may be checked every 5 years, or more frequently if you are over 89 years old.  Skin check.  Lung cancer screening. You may have this screening every year starting at age 19 if you have a 30-pack-year history of smoking and currently smoke or have quit within the past 15 years.  Fecal occult blood test (FOBT) of the stool. You may have this test every year starting at age 5.  Flexible sigmoidoscopy or colonoscopy. You may have a sigmoidoscopy every 5 years or a colonoscopy every 10 years starting at age 67.  Hepatitis C blood test.  Hepatitis B blood test.  Sexually transmitted disease (STD) testing.  Diabetes screening. This is done by checking your blood sugar (glucose) after you have not eaten for a while (fasting). You may have this done every 1-3 years.  Mammogram. This may be done every 1-2 years. Talk to your health care provider about when you should start having regular mammograms. This may depend on whether you have a family history of breast cancer.  BRCA-related cancer screening. This may be done if you have a family history of breast, ovarian, tubal, or peritoneal cancers.  Pelvic exam and Pap  test. This may be done every 3 years starting at age 63. Starting at age 62, this may be done every 5 years if you have a Pap test in combination with an HPV test.  Bone density scan. This is done to screen for osteoporosis. You may have this scan if you are at high risk for osteoporosis.  Discuss your test results, treatment options, and if necessary, the need for more tests with your health care provider. Vaccines Your health care provider may recommend certain vaccines, such  as:  Influenza vaccine. This is recommended every year.  Tetanus, diphtheria, and acellular pertussis (Tdap, Td) vaccine. You may need a Td booster every 10 years.  Varicella vaccine. You may need this if you have not been vaccinated.  Zoster vaccine. You may need this after age 56.  Measles, mumps, and rubella (MMR) vaccine. You may need at least one dose of MMR if you were born in 1957 or later. You may also need a second dose.  Pneumococcal 13-valent conjugate (PCV13) vaccine. You may need this if you have certain conditions and were not previously vaccinated.  Pneumococcal polysaccharide (PPSV23) vaccine. You may need one or two doses if you smoke cigarettes or if you have certain conditions.  Meningococcal vaccine. You may need this if you have certain conditions.  Hepatitis A vaccine. You may need this if you have certain conditions or if you travel or work in places where you may be exposed to hepatitis A.  Hepatitis B vaccine. You may need this if you have certain conditions or if you travel or work in places where you may be exposed to hepatitis B.  Haemophilus influenzae type b (Hib) vaccine. You may need this if you have certain conditions.  Talk to your health care provider about which screenings and vaccines you need and how often you need them. This information is not intended to replace advice given to you by your health care provider. Make sure you discuss any questions you have with your health care provider. Document Released: 12/20/2015 Document Revised: 08/12/2016 Document Reviewed: 09/24/2015 Elsevier Interactive Patient Education  2017 [employer].

## 2017-05-15 LAB — HIV ANTIBODY (ROUTINE TESTING W REFLEX): HIV 1&2 Ab, 4th Generation: NONREACTIVE

## 2017-05-15 LAB — HEPATITIS C ANTIBODY: HCV Ab: NEGATIVE

## 2017-05-17 ENCOUNTER — Telehealth: Payer: Self-pay

## 2017-05-17 NOTE — Telephone Encounter (Signed)
Called patient back regarding call she made to [facility] on 05/15/17 (received note today) Given lab results and mailed copy to patient per her request.

## 2017-05-19 DIAGNOSIS — L810 Postinflammatory hyperpigmentation: Secondary | ICD-10-CM | POA: Diagnosis not present

## 2017-05-19 DIAGNOSIS — L821 Other seborrheic keratosis: Secondary | ICD-10-CM | POA: Diagnosis not present

## 2017-05-19 DIAGNOSIS — D485 Neoplasm of uncertain behavior of skin: Secondary | ICD-10-CM | POA: Diagnosis not present

## 2017-06-08 ENCOUNTER — Other Ambulatory Visit: Payer: Self-pay | Admitting: Family Medicine

## 2017-06-08 NOTE — Telephone Encounter (Signed)
Ok 30, no RFs

## 2017-06-08 NOTE — Telephone Encounter (Signed)
Requesting:   Alprazolam Contract   None UDS   None Last OV    05/14/2017 Last Refill    #30 with 2 refills on 04/13/17  Please Advise

## 2017-06-08 NOTE — Telephone Encounter (Signed)
Faxed hardcopy to [facility] on [address]

## 2017-06-14 ENCOUNTER — Other Ambulatory Visit: Payer: Self-pay | Admitting: Neurology

## 2017-07-12 ENCOUNTER — Other Ambulatory Visit: Payer: Self-pay | Admitting: Internal Medicine

## 2017-07-12 NOTE — Telephone Encounter (Signed)
Faxed hardcopy for alprazolam to [facility] on [address]

## 2017-07-12 NOTE — Telephone Encounter (Signed)
Last office visit on 05/14/2017 Last refil on 06/08/2017  #30 no refills No contract and no UDS

## 2017-07-29 ENCOUNTER — Other Ambulatory Visit: Payer: Self-pay | Admitting: Family Medicine

## 2017-07-29 DIAGNOSIS — I10 Essential (primary) hypertension: Secondary | ICD-10-CM

## 2017-07-29 DIAGNOSIS — R002 Palpitations: Secondary | ICD-10-CM

## 2017-07-30 ENCOUNTER — Other Ambulatory Visit: Payer: Self-pay | Admitting: Family Medicine

## 2017-07-30 ENCOUNTER — Telehealth: Payer: Self-pay

## 2017-07-30 ENCOUNTER — Other Ambulatory Visit: Payer: Self-pay | Admitting: Neurology

## 2017-07-30 DIAGNOSIS — R002 Palpitations: Secondary | ICD-10-CM

## 2017-07-30 DIAGNOSIS — I10 Essential (primary) hypertension: Secondary | ICD-10-CM

## 2017-07-30 MED ORDER — METOPROLOL SUCCINATE ER 50 MG PO TB24: 50.0000 mg | ORAL_TABLET | Freq: Every day | ORAL | 0 refills | Status: DC

## 2017-07-30 NOTE — Telephone Encounter (Signed)
90d+3 denied as 30d of Metoprolol ER was faxed with note for pt to sched OV/thx dmf

## 2017-07-30 NOTE — Telephone Encounter (Signed)
Relation to FE:OFHQ Call back number: Pharmacy:  Reason for call:  Patient spoke with pharmacy a few minutes as per pharmacy never received metoprolol succinate (TOPROL-XL) 50 MG 24 hr tablet, requesting Rx phone in, please advise [place], [place].

## 2017-07-30 NOTE — Telephone Encounter (Signed)
Called to pharmacy LMOVM as they did not rec Rx/thx dmf

## 2017-07-30 NOTE — Telephone Encounter (Signed)
Faxed Metoprolol for 90d with note for pt to call to sched OV in Oct/thx dmf

## 2017-08-05 ENCOUNTER — Other Ambulatory Visit: Payer: Self-pay | Admitting: Family Medicine

## 2017-08-05 NOTE — Telephone Encounter (Signed)
Requesting:  Alprazolam Contract: Yes-it's old UDS: 5.19.2014 Last OV: 6.08.2018 Next OV: Not scheduled Last Refill: 8.6.18   Please advise

## 2017-08-06 NOTE — Telephone Encounter (Signed)
Patient aware Rx was faxed to pharm/thx dmf

## 2017-08-16 ENCOUNTER — Encounter: Payer: Self-pay | Admitting: Neurology

## 2017-08-16 ENCOUNTER — Ambulatory Visit (INDEPENDENT_AMBULATORY_CARE_PROVIDER_SITE_OTHER): Payer: Medicare Other | Admitting: Neurology

## 2017-08-16 VITALS — BP 116/80 | HR 62 | Ht 62.0 in | Wt 192.0 lb

## 2017-08-16 DIAGNOSIS — G43709 Chronic migraine without aura, not intractable, without status migrainosus: Secondary | ICD-10-CM

## 2017-08-16 NOTE — Progress Notes (Signed)
NEUROLOGY FOLLOW UP OFFICE NOTE  Ebony Garcia [id number]  HISTORY OF PRESENT ILLNESS: Ebony Garcia is a 66 year old right-handed woman with hypertension, CHF, renal calculi, depression, and hyperlipidemia and who follows up for migraine headache.    UPDATE:  In May, I recommended to retry Botox but she said she would not be able to afford the 20% co-pay cost.  A few weeks after last visit, she started having migraines daily.  However, she had one day of headache-freedom last week.  The Maxalt is effective, however.   Intensity:  7/10 Duration:  30 minutes with Maxalt. Frequency:  25 headache days a month Current NSAIDS:  Toradol Current analgesics:  no. Current triptans:  Maxalt 10mg  Current anti-emetic:  Phenergan Current muscle relaxants:  no Current anti-anxiolytic:  Xanax 1mg  Current sleep aide:  Xanax 1mg  Current Antihypertensive medications:  Toprol XL 50mg , Lasix Current Antidepressant medications:  Prozac 20mg  Current Anticonvulsant medications:  topiramate 100mg  BID, gabapentin 300mg  at night  Depression is improved but there is a lot going on that is causing a great amount of stress.  She has several close family members that are ill.  Some of her bills are in collection and has been in contact with a lawyer to discuss filing for bankruptcy.  One morning, she reported hearing voices.  She hear a woman say "how are you doing?".  Later that morning in bed, she heard a man say "hospital".  The next morning, she heard somebody say "mom".  She is wondering if it is a side effect of the topiramate.   HISTORY: Onset:  Since late 56s Location: usually left sided, periorbital, and back of head.  More recently, holocephalic Quality:  Sharp, aching pressure.  Non-throbbing Initial intensity:  7/10 Associated symptoms:  Nausea, photophobia and phonophobia.  No visual disturbance, osmophobia or vomiting Aura:  no Initial duration:  3 days Initial frequency:  25 headache  days per month Triggers/exacerbating factors:  stress Relieving factors:  Cold mask, heating pack on neck and shoulders   Past NSAIDs:  naproxen (ineffective), indomethacin, toradol tablet (ineffective), Toradol 60mg  IM (effective), Cambia (side effects) Past analgesics:  Tylenol (ineffective), Excedrin (ineffective), Lidocaine nasal drops (effective but caused hives), tramadol (side effects), Midrin (effective but made her sleepy) Past triptans/ergots:  sumatriptan 100mg , Zomig 5mg  (effective), Frova, Relpax, DHE (effective)   Past antiemetic:  Reglan Past anxiolytics:  Vicodin Past muscle relaxants:  cyclobenzaprine Past antihypertensives:  propranolol (ineffective), atenolol (ineffective), lisinopril, losartan, HCTZ Past antidepressants:  amitriptyline (side effects), venlafaxine (ineffective) Past antiepileptics:  Depakote (hair loss), lamotrigine (side effects), zonisamide (side effects) Past vitamins and supplements:  magnesium (ineffective), feverfew (ineffective) Other past therapy:  Botox (one round, flu like symptoms), occipital nerve blocks, trigger point injections, alternating heat/ice   Family history of headache:  Mother, grandmother, great-aunt, cousins.   08/16/08 MRI Brain w/wo performed for "explosive headaches": nonspecific punctate hyperintensities in the subcortical white matter. 08/16/08 MRA Head: Unremarkable.  There is mild attenuation in the vertebrobasilar junction and proximal basilar artery which is likely artifact.  PAST MEDICAL HISTORY: Past Medical History:  Diagnosis Date  . Cancer ([place])   . CHF (congestive heart failure) ([place])    dystolic heart dysfunction  . Chronic kidney disease   . Depression   . Depression with anxiety   . Diabetes mellitus without complication ([place])    Diet controlled  . Hypertension   . Intertrigo   . Migraines   . Panic attacks  MEDICATIONS: Current Outpatient Prescriptions on File Prior to Visit  Medication Sig  Dispense Refill  . ALPRAZolam (XANAX) 1 MG tablet TAKE 1 TABLET BY MOUTH AT BEDTIME AS NEEDED FOR SLEEP 30 tablet 0  . FLUoxetine (PROZAC) 20 MG capsule TAKE ONE CAPSULE BY MOUTH ONCE DAILY 30 capsule 6  . furosemide (LASIX) 20 MG tablet Take 1 tablet (20 mg total) by mouth daily. 90 tablet 3  . metoprolol succinate (TOPROL-XL) 50 MG 24 hr tablet Take 1 tablet (50 mg total) by mouth daily. Take with or immediately following a meal. 90 tablet 2  . nitroGLYCERIN (NITROSTAT) 0.4 MG SL tablet Place 1 tablet (0.4 mg total) under the tongue every 5 (five) minutes as needed for chest pain. 25 tablet 3  . promethazine (PHENERGAN) 25 MG tablet Take 25 mg by mouth as needed.    . rizatriptan (MAXALT) 10 MG tablet TAKE 1 TABLET EARLIEST ONSET OF HEADACHE. MAY REPEAT ONCE IN 2 HOURS IF NEEDED 10 tablet 1  . topiramate (TOPAMAX) 50 MG tablet Take 2 tablets (100 mg total) by mouth 2 (two) times daily. 120 tablet 1  . triamcinolone ointment (KENALOG) 0.1 % Apply topically as needed     No current facility-administered medications on file prior to visit.     ALLERGIES: Allergies  Allergen Reactions  . Amoxicillin-Pot Clavulanate Diarrhea  . Antihistamines, Diphenhydramine-Type Other (See Comments)    Other Reaction: makes pt nervous  . Diphenhydramine Hcl Other (See Comments)    Other Reaction: makes pt nervous  . Amoxicillin-Pot Clavulanate Diarrhea  . Clindamycin     She has not taken but per patient  "her mother had a reaction and she does not want it ever"  . Hydrocodone-Acetaminophen     REACTION: sensitive to  but can take for extreme pain per patient  . Propoxyphene Hcl Nausea Only  . Propoxyphene N-Acetaminophen Nausea Only  . Sulfa Antibiotics     Unknown   . Tramadol Other (See Comments)    Pt does know what kind of effect  . Lisinopril     Restless , irregular sleep    FAMILY HISTORY: Family History  Problem Relation Age of Onset  . Crohn's disease Mother   . Atrial fibrillation  Mother   . Heart failure Mother   . Bladder Cancer Father   . Cancer Father        bladder  . Bipolar disorder Brother   . Bipolar disorder Sister   . Heart attack Maternal Grandmother        >65  . Cancer Maternal Aunt   . Colon cancer Maternal Uncle   . Cancer Paternal Grandfather   . Cancer Maternal Aunt   . Cancer Cousin   . Anemia Cousin   . Diabetes Neg Hx   . Stroke Neg Hx     SOCIAL HISTORY: Social History   Social History  . Marital status: Divorced    Spouse name: N/A  . Number of children: 3  . Years of education: N/A   Occupational History  .  Unemployed   Social History Main Topics  . Smoking status: Never Smoker  . Smokeless tobacco: Never Used  . Alcohol use No  . Drug use: No  . Sexual activity: Yes    Partners: Male   Other Topics Concern  . Not on file   Social History Narrative   Lives alone.            REVIEW OF SYSTEMS: Constitutional: No fevers,  chills, or sweats, no generalized fatigue, change in appetite Eyes: No visual changes, double vision, eye pain Ear, nose and throat: No hearing loss, ear pain, nasal congestion, sore throat Cardiovascular: No chest pain, palpitations Respiratory:  No shortness of breath at rest or with exertion, wheezes GastrointestinaI: No nausea, vomiting, diarrhea, abdominal pain, fecal incontinence Genitourinary:  No dysuria, urinary retention or frequency Musculoskeletal:  No neck pain, back pain Integumentary: No rash, pruritus, skin lesions Neurological: as above Psychiatric: No depression, insomnia, anxiety Endocrine: No palpitations, fatigue, diaphoresis, mood swings, change in appetite, change in weight, increased thirst Hematologic/Lymphatic:  No purpura, petechiae. Allergic/Immunologic: no itchy/runny eyes, nasal congestion, recent allergic reactions, rashes  PHYSICAL EXAM: Vitals:   08/16/17 0908  BP: 116/80  Pulse: 62  SpO2: 98%   General: No acute distress.  Patient appears  well-groomed.    IMPRESSION: 1.  Chronic migraine 2.  I don't really think that the topiramate caused her auditory hallucinations, but we will try and taper down her dose.  PLAN: 1.  After discussion, she would like to start [place].   2.  She will continue topiramate for now with plan to decrease dose when she is on another preventative (Aimovig). 3.  Continue Maxalt 4.  Monitor auditory hallucinations.  They are not frequent.  May need to be addressed with Dr. Etter Sjogren 5.  Follow up in 3 months.  25 minutes spent face to face with patient, 100% spent discussing symptoms and management.  Metta Clines, DO  CC:  Roma Schanz, DO

## 2017-08-16 NOTE — Patient Instructions (Signed)
1.  Continue Maxalt 2.  Continue topiramate  3.  I will prescribe Aimovig 4.  Follow up in 3 months.

## 2017-08-30 ENCOUNTER — Other Ambulatory Visit: Payer: Self-pay | Admitting: Family Medicine

## 2017-08-31 NOTE — Telephone Encounter (Signed)
Requesting:  Alprazolam Contract: Yes-it's old UDS: 5.19.2014 low-risk Last OV: 6.08.2018 Next OV: 10.08.18 Last Refill: 9.03.18   Please advise

## 2017-09-06 ENCOUNTER — Other Ambulatory Visit: Payer: Self-pay | Admitting: Neurology

## 2017-09-06 DIAGNOSIS — R110 Nausea: Secondary | ICD-10-CM

## 2017-09-07 ENCOUNTER — Telehealth: Payer: Self-pay | Admitting: Neurology

## 2017-09-07 NOTE — Telephone Encounter (Signed)
Patient is having headaches  everyday she is sick all over. The medication is not helping. She states that her teeth and gums and body just hurts. She almost threw up yesterday from the headache please call her today

## 2017-09-08 ENCOUNTER — Telehealth: Payer: Self-pay | Admitting: Neurology

## 2017-09-08 NOTE — Telephone Encounter (Signed)
Patient called regarding her [place] and the wrong address was put on the  Paperwork. The correct address is what is on her Chart. Please Call. Thanks

## 2017-09-08 NOTE — Telephone Encounter (Signed)
Spoke with Pt, she rcvd her aimovig, is reluctant to try it, believes the headaches she is currently having are not migraines and aimovig info states is strictly for migraines. Advsd her what she was describing sounded like migraines, and she should try the aimovig. Pt states will try and call us to let us know how she feels

## 2017-09-09 ENCOUNTER — Ambulatory Visit: Payer: Medicare Other | Admitting: Neurology

## 2017-09-09 NOTE — Telephone Encounter (Signed)
Spoke with Pt, advsd her we have her correct address and it was correct on aimovig form, Pt will call Pt support number to ensure her 2nd free trial arrives correctly

## 2017-09-13 ENCOUNTER — Ambulatory Visit: Payer: Medicare Other | Admitting: Family Medicine

## 2017-09-20 ENCOUNTER — Encounter: Payer: Self-pay | Admitting: Family Medicine

## 2017-09-20 ENCOUNTER — Ambulatory Visit (INDEPENDENT_AMBULATORY_CARE_PROVIDER_SITE_OTHER): Payer: Medicare Other | Admitting: Family Medicine

## 2017-09-20 VITALS — BP 130/90 | HR 72 | Temp 97.8°F | Ht 62.0 in | Wt 190.0 lb

## 2017-09-20 DIAGNOSIS — I10 Essential (primary) hypertension: Secondary | ICD-10-CM

## 2017-09-20 DIAGNOSIS — Z23 Encounter for immunization: Secondary | ICD-10-CM

## 2017-09-20 DIAGNOSIS — F418 Other specified anxiety disorders: Secondary | ICD-10-CM | POA: Diagnosis not present

## 2017-09-20 DIAGNOSIS — E785 Hyperlipidemia, unspecified: Secondary | ICD-10-CM

## 2017-09-20 DIAGNOSIS — G43809 Other migraine, not intractable, without status migrainosus: Secondary | ICD-10-CM

## 2017-09-20 LAB — COMPREHENSIVE METABOLIC PANEL
ALT: 12 U/L (ref 0–35)
AST: 13 U/L (ref 0–37)
Albumin: 4.2 g/dL (ref 3.5–5.2)
Alkaline Phosphatase: 88 U/L (ref 39–117)
BUN: 17 mg/dL (ref 6–23)
CO2: 21 mEq/L (ref 19–32)
Calcium: 9.2 mg/dL (ref 8.4–10.5)
Chloride: 108 mEq/L (ref 96–112)
Creatinine, Ser: 1.01 mg/dL (ref 0.40–1.20)
GFR: 58.66 mL/min — ABNORMAL LOW (ref >60.00)
Glucose, Bld: 125 mg/dL — ABNORMAL HIGH (ref 70–99)
Potassium: 3.7 mEq/L (ref 3.5–5.1)
Sodium: 139 mEq/L (ref 135–145)
Total Bilirubin: 0.3 mg/dL (ref 0.2–1.2)
Total Protein: 7.2 g/dL (ref 6.0–8.3)

## 2017-09-20 LAB — CBC WITH DIFFERENTIAL/PLATELET
Basophils Absolute: 0.1 10*3/uL (ref 0.0–0.1)
Basophils Relative: 0.7 % (ref 0.0–3.0)
Eosinophils Absolute: 0.2 10*3/uL (ref 0.0–0.7)
Eosinophils Relative: 2.2 % (ref 0.0–5.0)
HCT: 42.6 % (ref 36.0–46.0)
Hemoglobin: 14.1 g/dL (ref 12.0–15.0)
Lymphocytes Relative: 18.9 % (ref 12.0–46.0)
Lymphs Abs: 1.4 10*3/uL (ref 0.7–4.0)
MCHC: 33 g/dL (ref 30.0–36.0)
MCV: 93.9 fl (ref 78.0–100.0)
Monocytes Absolute: 0.5 10*3/uL (ref 0.1–1.0)
Monocytes Relative: 7.1 % (ref 3.0–12.0)
Neutro Abs: 5.1 10*3/uL (ref 1.4–7.7)
Neutrophils Relative %: 71.1 % (ref 43.0–77.0)
Platelets: 108 10*3/uL — ABNORMAL LOW (ref 150.0–400.0)
RBC: 4.54 Mil/uL (ref 3.87–5.11)
RDW: 13.7 % (ref 11.5–15.5)
WBC: 7.2 10*3/uL (ref 4.0–10.5)

## 2017-09-20 LAB — LIPID PANEL
Cholesterol: 203 mg/dL — ABNORMAL HIGH (ref 0–200)
HDL: 43.9 mg/dL (ref >39.00)
NonHDL: 158.94
Total CHOL/HDL Ratio: 5
Triglycerides: 201 mg/dL — ABNORMAL HIGH (ref 0.0–149.0)
VLDL: 40.2 mg/dL — ABNORMAL HIGH (ref 0.0–40.0)

## 2017-09-20 LAB — MICROALBUMIN / CREATININE URINE RATIO
Creatinine,U: 278.2 mg/dL
Microalb Creat Ratio: 0.4 mg/g (ref 0.0–30.0)
Microalb, Ur: 1.1 mg/dL (ref 0.0–1.9)

## 2017-09-20 LAB — LDL CHOLESTEROL, DIRECT: Direct LDL: 121 mg/dL

## 2017-09-20 NOTE — Assessment & Plan Note (Signed)
Stable-- ups and downs due to pain and migraines but pt does not want to inc prozac

## 2017-09-20 NOTE — Assessment & Plan Note (Signed)
Well controlled, no changes to meds. Encouraged heart healthy diet such as the DASH diet and exercise as tolerated.  °

## 2017-09-20 NOTE — Assessment & Plan Note (Signed)
Per neuro 

## 2017-09-20 NOTE — Progress Notes (Signed)
Patient ID: Ebony Garcia, female    DOB: 05/09/1951  Age: 66 y.o. MRN: [id number]    Subjective:  Subjective  HPI Ebony Garcia presents for f/u bp, cholesterol.  She is seeing neuro for migraines.  She has started a new medication -- injection -- it is too soon to know if It is working.    Review of Systems  Constitutional: Positive for fatigue. Negative for activity change, appetite change and unexpected weight change.  Respiratory: Negative for cough and shortness of breath.   Cardiovascular: Negative for chest pain and palpitations.  Psychiatric/Behavioral: Negative for behavioral problems and dysphoric mood. The patient is not nervous/anxious.     History Past Medical History:  Diagnosis Date  . Cancer ([place])   . CHF (congestive heart failure) ([place])    dystolic heart dysfunction  . Chronic kidney disease   . Depression   . Depression with anxiety   . Diabetes mellitus without complication ([place])    Diet controlled  . Hypertension   . Intertrigo   . Migraines   . Panic attacks     She has a past surgical history that includes Total abdominal hysterectomy w/ bilateral salpingoophorectomy; Tonsillectomy and adenoidectomy; Nasal septum surgery; Tubal ligation; Dilation and curettage of uterus; papaloma (Right); Breast surgery; and Abdominal hysterectomy.   Her family history includes Anemia in her cousin; Atrial fibrillation in her mother; Bipolar disorder in her brother and sister; Bladder Cancer in her father; Cancer in her cousin, father, maternal aunt, maternal aunt, and paternal grandfather; Colon cancer in her maternal uncle; Crohn's disease in her mother; Heart attack in her maternal grandmother; Heart failure in her mother.She reports that she has never smoked. She has never used smokeless tobacco. She reports that she does not drink alcohol or use drugs.  Current Outpatient Prescriptions on File Prior to Visit  Medication Sig Dispense Refill  . ALPRAZolam  (XANAX) 1 MG tablet TAKE 1 TABLET BY MOUTH AT BEDTIME AS NEEDED FOR SLEEP 30 tablet 0  . estradiol (ESTRACE) 0.5 MG tablet Take 0.5 mg by mouth daily. 1/2 tab daily    . FLUoxetine (PROZAC) 20 MG capsule TAKE ONE CAPSULE BY MOUTH ONCE DAILY 30 capsule 6  . furosemide (LASIX) 20 MG tablet Take 1 tablet (20 mg total) by mouth daily. 90 tablet 3  . metoprolol succinate (TOPROL-XL) 50 MG 24 hr tablet Take 1 tablet (50 mg total) by mouth daily. Take with or immediately following a meal. 90 tablet 2  . nitroGLYCERIN (NITROSTAT) 0.4 MG SL tablet Place 1 tablet (0.4 mg total) under the tongue every 5 (five) minutes as needed for chest pain. 25 tablet 3  . promethazine (PHENERGAN) 25 MG tablet Take 25 mg by mouth as needed.    . promethazine (PHENERGAN) 25 MG tablet TAKE ONE TABLET BY MOUTH EVERY 6 HOURS AS NEEDED FOR NAUSEA AND VOMITING 30 tablet 2  . rizatriptan (MAXALT) 10 MG tablet TAKE 1 TABLET EARLIEST ONSET OF HEADACHE. MAY REPEAT ONCE IN 2 HOURS IF NEEDED 10 tablet 1  . topiramate (TOPAMAX) 50 MG tablet Take 2 tablets (100 mg total) by mouth 2 (two) times daily. 120 tablet 1  . triamcinolone ointment (KENALOG) 0.1 % Apply topically as needed     No current facility-administered medications on file prior to visit.      Objective:  Objective  Physical Exam  Constitutional: She is oriented to person, place, and time. She appears well-developed and well-nourished.  HENT:  Head: Normocephalic and atraumatic.  Eyes: Conjunctivae and EOM are normal.  Neck: Normal range of motion. Neck supple. No JVD present. Carotid bruit is not present. No thyromegaly present.  Cardiovascular: Normal rate, regular rhythm and normal heart sounds.   No murmur heard. Pulmonary/Chest: Effort normal and breath sounds normal. No respiratory distress. She has no wheezes. She has no rales. She exhibits no tenderness.  Musculoskeletal: She exhibits no edema.  Neurological: She is alert and oriented to person, place, and  time.  Psychiatric: Her speech is normal and behavior is normal. Thought content normal. Her mood appears anxious. She exhibits a depressed mood.  Nursing note and vitals reviewed.  BP 130/90   Pulse 72   Temp 97.8 F (36.6 C) (Oral)   Ht 5\' 2"  (1.575 m)   Wt 190 lb (86.2 kg)   SpO2 98%   BMI 34.75 kg/m  Wt Readings from Last 3 Encounters:  09/20/17 190 lb (86.2 kg)  08/16/17 192 lb (87.1 kg)  05/14/17 190 lb 3.2 oz (86.3 kg)     Lab Results  Component Value Date   WBC 7.2 05/14/2017   HGB 13.1 05/14/2017   HCT 40.2 05/14/2017   PLT 72.0 (L) 05/14/2017   GLUCOSE 126 (H) 05/14/2017   CHOL 154 05/14/2017   TRIG 154.0 (H) 05/14/2017   HDL 42.90 05/14/2017   LDLDIRECT 98.1 01/21/2011   LDLCALC 80 05/14/2017   ALT 12 05/14/2017   AST 13 05/14/2017   NA 139 05/14/2017   K 3.8 05/14/2017   CL 111 05/14/2017   CREATININE 0.89 05/14/2017   BUN 14 05/14/2017   CO2 23 05/14/2017   TSH 3.62 11/03/2016   INR 0.93 07/19/2010   HGBA1C 5.9 12/08/2012   MICROALBUR 0.7 04/18/2012    Mr Brain Wo Contrast  Result Date: 08/27/2015 CLINICAL DATA:  66 year old hypertensive diabetic female with headache. Mass in back of her head on the left side which has been present for many months. On disability for migraines. Cancer, type not specified. Initial encounter. EXAM: MRI HEAD WITHOUT CONTRAST TECHNIQUE: Multiplanar, multiecho pulse sequences of the brain and surrounding structures were obtained without intravenous contrast. COMPARISON:  08/15/2008 brain MR. FINDINGS: No mass noted left-side of head. There is slight asymmetry of subcutaneous fat. Question if this is what patient perceives as an abnormality. No acute infarct. No intracranial hemorrhage. Minimal punctate nonspecific white matter type changes most notable frontal lobes. This may be related to patient's migraine headaches or small vessel disease. No intracranial mass lesion noted on this unenhanced exam. No hydrocephalus. Major  intracranial vascular structures are patent. Partial opacification left mastoid air cells. No obstructing lesion of the eustachian tube identified. Tiny parotid lesions suggestive of small lymph nodes. Minimal to mild mucosal thickening frontal sinuses, ethmoid sinus air cells and maxillary sinuses. Mild transverse ligament hypertrophy. Cervicomedullary junction, pituitary region, pineal region orbital structures unremarkable. IMPRESSION: No mass noted left-side of head. There is slight asymmetry of subcutaneous fat. Question if this is what patient perceives as an abnormality. No acute infarct. No intracranial hemorrhage. Minimal punctate nonspecific white matter type changes most notable frontal lobes. This may be related to patient's migraine headaches or small vessel disease. No intracranial mass lesion noted on this unenhanced exam. Partial opacification left mastoid air cells. No obstructing lesion of the eustachian tube identified. Minimal to mild mucosal thickening frontal sinuses, ethmoid sinus air cells and maxillary sinuses. Electronically Signed   By: Genia Del M.D.   On: 08/27/2015 10:03     Assessment &  Plan:  Plan  I am having [name] maintain her nitroGLYCERIN, promethazine, triamcinolone ointment, furosemide, topiramate, FLUoxetine, rizatriptan, metoprolol succinate, estradiol, ALPRAZolam, and promethazine.  No orders of the defined types were placed in this encounter.   Problem List Items Addressed This Visit      Unprioritized   Depression with anxiety    Stable-- ups and downs due to pain and migraines but pt does not want to inc prozac      Essential hypertension    Well controlled, no changes to meds. Encouraged heart healthy diet such as the DASH diet and exercise as tolerated.       Relevant Orders   Lipid panel   CBC with Differential/Platelet   Comprehensive metabolic panel   Microalbumin / creatinine urine ratio   Hyperlipidemia    Encouraged heart  healthy diet, increase exercise, avoid trans fats, consider a krill oil cap daily      Relevant Orders   Lipid panel   Comprehensive metabolic panel   Migraine variant    Per neuro       Other Visit Diagnoses    Need for immunization against influenza    -  Primary   Relevant Orders   Flu Vaccine QUAD 6+ mos IM (Fluarix)      Follow-up: Return in about 6 months (around 03/21/2018) for annual exam, fasting.  [name], DO

## 2017-09-20 NOTE — Patient Instructions (Signed)
Major Depressive Disorder, Adult Major depressive disorder (MDD) is a mental health condition. It may also be called clinical depression or unipolar depression. MDD usually causes feelings of sadness, hopelessness, or helplessness. MDD can also cause physical symptoms. It can interfere with work, school, relationships, and other everyday activities. MDD may be mild, moderate, or severe. It may occur once (single episode major depressive disorder) or it may occur multiple times (recurrent major depressive disorder). What are the causes? The exact cause of this condition is not known. MDD is most likely caused by a combination of things, which may include:  Genetic factors. These are traits that are passed along from parent to child.  Individual factors. Your personality, your behavior, and the way you handle your thoughts and feelings may contribute to MDD. This includes personality traits and behaviors learned from others.  Physical factors, such as: ? Differences in the part of your brain that controls emotion. This part of your brain may be different than it is in people who do not have MDD. ? Long-term (chronic) medical or psychiatric illnesses.  Social factors. Traumatic experiences or major life changes may play a role in the development of MDD.  What increases the risk? This condition is more likely to develop in women. The following factors may also make you more likely to develop MDD:  A family history of depression.  Troubled family relationships.  Abnormally low levels of certain brain chemicals.  Traumatic events in childhood, especially abuse or the loss of a parent.  Being under a lot of stress, or long-term stress, especially from upsetting life experiences or losses.  A history of: ? Chronic physical illness. ? Other mental health disorders. ? Substance abuse.  Poor living conditions.  Experiencing social exclusion or discrimination on a regular basis.  What are  the signs or symptoms? The main symptoms of MDD typically include:  Constant depressed or irritable mood.  Loss of interest in things and activities.  MDD symptoms may also include:  Sleeping or eating too much or too little.  Unexplained weight change.  Fatigue or low energy.  Feelings of worthlessness or guilt.  Difficulty thinking clearly or making decisions.  Thoughts of suicide or of harming others.  Physical agitation or weakness.  Isolation.  Severe cases of MDD may also occur with other symptoms, such as:  Delusions or hallucinations, in which you imagine things that are not real (psychotic depression).  Low-level depression that lasts at least a year (chronic depression or persistent depressive disorder).  Extreme sadness and hopelessness (melancholic depression).  Trouble speaking and moving (catatonic depression).  How is this diagnosed? This condition may be diagnosed based on:  Your symptoms.  Your medical history, including your mental health history. This may involve tests to evaluate your mental health. You may be asked questions about your lifestyle, including any drug and alcohol use, and how long you have had symptoms of MDD.  A physical exam.  Blood tests to rule out other conditions.  You must have a depressed mood and at least four other MDD symptoms most of the day, nearly every day in the same 2-week timeframe before your health care provider can confirm a diagnosis of MDD. How is this treated? This condition is usually treated by mental health professionals, such as psychologists, psychiatrists, and clinical social workers. You may need more than one type of treatment. Treatment may include:  Psychotherapy. This is also called talk therapy or counseling. Types of psychotherapy include: ? Cognitive behavioral   therapy (CBT). This type of therapy teaches you to recognize unhealthy feelings, thoughts, and behaviors, and replace them with  positive thoughts and actions. ? Interpersonal therapy (IPT). This helps you to improve the way you relate to and communicate with others. ? Family therapy. This treatment includes members of your family.  Medicine to treat anxiety and depression, or to help you control certain emotions and behaviors.  Lifestyle changes, such as: ? Limiting alcohol and drug use. ? Exercising regularly. ? Getting plenty of sleep. ? Making healthy eating choices. ? Spending more time outdoors.  Treatments involving stimulation of the brain can be used in situations with extremely severe symptoms, or when medicine or other therapies do not work over time. These treatments include electroconvulsive therapy, transcranial magnetic stimulation, and vagal nerve stimulation. Follow these instructions at home: Activity  Return to your normal activities as told by your health care provider.  Exercise regularly and spend time outdoors as told by your health care provider. General instructions  Take over-the-counter and prescription medicines only as told by your health care provider.  Do not drink alcohol. If you drink alcohol, limit your alcohol intake to no more than 1 drink a day for nonpregnant women and 2 drinks a day for men. One drink equals 12 oz of beer, 5 oz of wine, or 1 oz of hard liquor. Alcohol can affect any antidepressant medicines you are taking. Talk to your health care provider about your alcohol use.  Eat a healthy diet and get plenty of sleep.  Find activities that you enjoy doing, and make time to do them.  Consider joining a support group. Your health care provider may be able to recommend a support group.  Keep all follow-up visits as told by your health care provider. This is important. Where to find more information: National Alliance on Mental Illness  www.nami.org  U.S. National Institute of Mental Health  www.nimh.nih.gov  National Suicide Prevention  Lifeline  1-800-273-TALK (8255). This is free, 24-hour help.  Contact a health care provider if:  Your symptoms get worse.  You develop new symptoms. Get help right away if:  You self-harm.  You have serious thoughts about hurting yourself or others.  You see, hear, taste, smell, or feel things that are not present (hallucinate). This information is not intended to replace advice given to you by your health care provider. Make sure you discuss any questions you have with your health care provider. Document Released: 03/20/2013 Document Revised: 07/30/2016 Document Reviewed: 06/03/2016 Elsevier Interactive Patient Education  2017 [employer].  

## 2017-09-20 NOTE — Assessment & Plan Note (Signed)
Encouraged heart healthy diet, increase exercise, avoid trans fats, consider a krill oil cap daily 

## 2017-09-24 ENCOUNTER — Other Ambulatory Visit: Payer: Self-pay | Admitting: Neurology

## 2017-09-28 ENCOUNTER — Other Ambulatory Visit: Payer: Self-pay

## 2017-09-28 ENCOUNTER — Other Ambulatory Visit: Payer: Self-pay | Admitting: Family Medicine

## 2017-09-28 DIAGNOSIS — I5189 Other ill-defined heart diseases: Secondary | ICD-10-CM

## 2017-09-28 NOTE — Progress Notes (Signed)
Rcvd notice of approval for Trokendi XR 25 mg #[id number] valid 09/27/17-09/27/18.

## 2017-09-29 NOTE — Telephone Encounter (Signed)
Requesting:Xanax Contract:No UDS:No Last OV:09/20/17 Next OV:Not scheduled  Last Refill:08/31/17   [id number]   Please advise

## 2017-09-30 ENCOUNTER — Other Ambulatory Visit: Payer: Self-pay | Admitting: Family Medicine

## 2017-10-01 ENCOUNTER — Telehealth: Payer: Self-pay | Admitting: Family Medicine

## 2017-10-01 NOTE — Telephone Encounter (Signed)
Call pt cell [phone] per her request. Pt needs XANAX called in today. It has not been done since requested previously. Script was printed on 09/29/17. Please fax script to pharmacy or call it in per pt request.  ALSO pt has concern her blood platelets are too low. Pt states she received lab results via mail and she finds it alarming that no one has talked to her about how low her platelets are. Pt states has felt bad for a long while. Please call.

## 2017-10-04 ENCOUNTER — Telehealth: Payer: Self-pay

## 2017-10-04 MED ORDER — ALPRAZOLAM 1 MG PO TABS: 1.0000 mg | ORAL_TABLET | Freq: Every evening | ORAL | 0 refills | Status: DC | PRN

## 2017-10-04 NOTE — Telephone Encounter (Addendum)
Relation to UQ:JFHL Call back number:[phone] Pharmacy: [facility], [place]. [phone] (Phone) [phone] (Fax)    Reason for call:  Patient checking on the status of ALPRAZolam (XANAX) 1 MG tablet refill. Patient sates if PCP is wondering why she's out, patient states  due to her taking 1/2 tablet due to migraine, patient states she also would like to discuss her platelet level high being high. Patient states PCP is aware from last office visit. Please advise

## 2017-10-04 NOTE — Telephone Encounter (Signed)
Called to follow up on Pt's call and to advise her to come in and be seen by provider. Pt did not answer. Left VM for Pt to call back.

## 2017-10-04 NOTE — Telephone Encounter (Signed)
Pt called back and stated that she's concerned with results from her blood count and wants to discuss fatigue issues with provider. Appointment made for tmrw.

## 2017-10-04 NOTE — Telephone Encounter (Signed)
rx faxed to pharmacy listed

## 2017-10-04 NOTE — Addendum Note (Signed)
Addended by: Magdalene Molly A on: 10/04/2017 02:34 PM   Modules accepted: Orders

## 2017-10-05 ENCOUNTER — Encounter: Payer: Self-pay | Admitting: Family Medicine

## 2017-10-05 ENCOUNTER — Ambulatory Visit (INDEPENDENT_AMBULATORY_CARE_PROVIDER_SITE_OTHER): Payer: Medicare Other | Admitting: Family Medicine

## 2017-10-05 VITALS — BP 118/84 | HR 59 | Temp 97.9°F | Resp 16 | Ht 62.0 in | Wt 189.4 lb

## 2017-10-05 DIAGNOSIS — D696 Thrombocytopenia, unspecified: Secondary | ICD-10-CM | POA: Diagnosis not present

## 2017-10-05 DIAGNOSIS — R440 Auditory hallucinations: Secondary | ICD-10-CM | POA: Diagnosis not present

## 2017-10-05 NOTE — Progress Notes (Signed)
Patient ID: Ebony Garcia, female   DOB: 1951-10-17, 66 y.o.   MRN: [id number]     Subjective:  I acted as a Education administrator for Dr. Carollee Herter.  [name], [place]   Patient ID: Ebony Garcia, female    DOB: Aug 05, 1951, 66 y.o.   MRN: [id number]  Chief Complaint  Patient presents with  . lab results    would like to discuss platlet count    HPI  Patient is in today to discuss lab work.  She would like to talk about platelet count.  She has been worried because she has cancer that runs in her family, has been feeling fatigued, and her gums were bleeding this morning when she brushed her teeth.  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  She also says she hears knocking at the door.  I was unsure what she meant and upon further questioning --- her neighbor is knocking on her door to "bother her."  Pt states "i am not crazy"  Patient Care Team: Carollee Herter, Alferd Apa, DO as PCP - General (Family Medicine) Janann August, MD as Referring Physician (Dermatology) Aloha Gell, MD as Consulting Physician (Obstetrics and Gynecology) Pieter Partridge, DO as Consulting Physician (Neurology) [place], Jennefer Bravo, MD as Consulting Physician (Dermatology) Minus Breeding, MD as Consulting Physician (Cardiology) Karie Georges, OD as Consulting Physician (Optometry) Eula Listen, DDS as Consulting Physician (Dentistry)   Past Medical History:  Diagnosis Date  . Cancer ([place])   . CHF (congestive heart failure) ([place])    dystolic heart dysfunction  . Chronic kidney disease   . Depression   . Depression with anxiety   . Diabetes mellitus without complication ([place])    Diet controlled  . Hypertension   . Intertrigo   . Migraines   . Panic attacks     Past Surgical History:  Procedure Laterality Date  . ABDOMINAL HYSTERECTOMY    . BREAST  SURGERY    . DILATION AND CURETTAGE OF UTERUS    . NASAL SEPTUM SURGERY    . papaloma Right   . TONSILLECTOMY AND ADENOIDECTOMY    . TOTAL ABDOMINAL HYSTERECTOMY W/ BILATERAL SALPINGOOPHORECTOMY     for fibroids and migraines  . TUBAL LIGATION      Family History  Problem Relation Age of Onset  . Crohn's disease Mother   . Atrial fibrillation Mother   . Heart failure Mother   . Bladder Cancer Father   . Cancer Father        bladder  . Bipolar disorder Brother   . Cancer Brother   . Bipolar disorder Sister   . Heart attack Maternal Grandmother        >65  . Cancer Maternal Aunt   . Colon cancer Maternal Uncle   . Cancer Paternal Grandfather   . Cancer Maternal Aunt   . Cancer Cousin   . Anemia Cousin   . Diabetes Neg Hx   . Stroke Neg Hx     Social History   Social History  . Marital status: Divorced    Spouse name: N/A  . Number of children: 3  . Years of education: N/A   Occupational History  .  Unemployed   Social History Main Topics  . Smoking status: Never Smoker  . Smokeless tobacco: Never  Used  . Alcohol use No  . Drug use: No  . Sexual activity: Yes    Partners: Male   Other Topics Concern  . Not on file   Social History Narrative   Lives alone.            Outpatient Medications Prior to Visit  Medication Sig Dispense Refill  . ALPRAZolam (XANAX) 1 MG tablet Take 1 tablet (1 mg total) by mouth at bedtime as needed. for sleep 30 tablet 0  . estradiol (ESTRACE) 0.5 MG tablet Take 0.5 mg by mouth daily. 1/2 tab daily    . FLUoxetine (PROZAC) 20 MG capsule TAKE ONE CAPSULE BY MOUTH ONCE DAILY 30 capsule 6  . furosemide (LASIX) 20 MG tablet TAKE ONE TABLET BY MOUTH ONCE DAILY 90 tablet 3  . metoprolol succinate (TOPROL-XL) 50 MG 24 hr tablet Take 1 tablet (50 mg total) by mouth daily. Take with or immediately following a meal. 90 tablet 2  . nitroGLYCERIN (NITROSTAT) 0.4 MG SL tablet Place 1 tablet (0.4 mg total) under the tongue every 5 (five)  minutes as needed for chest pain. 25 tablet 3  . promethazine (PHENERGAN) 25 MG tablet TAKE ONE TABLET BY MOUTH EVERY 6 HOURS AS NEEDED FOR NAUSEA AND VOMITING 30 tablet 2  . rizatriptan (MAXALT) 10 MG tablet TAKE 1 TABLET EARLIEST ONSET OF HEADACHE. MAY REPEAT ONCE IN 2 HOURS IF NEEDED 10 tablet 1  . topiramate (TOPAMAX) 100 MG tablet TAKE 1 TABLET BY MOUTH TWICE DAILY 30 tablet 2  . topiramate (TOPAMAX) 50 MG tablet Take 2 tablets (100 mg total) by mouth 2 (two) times daily. 120 tablet 1  . triamcinolone ointment (KENALOG) 0.1 % Apply topically as needed    . promethazine (PHENERGAN) 25 MG tablet Take 25 mg by mouth as needed.     No facility-administered medications prior to visit.     Allergies  Allergen Reactions  . Amoxicillin-Pot Clavulanate Diarrhea  . Antihistamines, Diphenhydramine-Type Other (See Comments)    Other Reaction: makes pt nervous  . Diphenhydramine Hcl Other (See Comments)    Other Reaction: makes pt nervous  . Amoxicillin-Pot Clavulanate Diarrhea  . Clindamycin     She has not taken but per patient  "her mother had a reaction and she does not want it ever"  . Hydrocodone-Acetaminophen     REACTION: sensitive to  but can take for extreme pain per patient  . Propoxyphene Hcl Nausea Only  . Propoxyphene N-Acetaminophen Nausea Only  . Sulfa Antibiotics     Unknown   . Tramadol Other (See Comments)    Pt does know what kind of effect  . Lisinopril     Restless , irregular sleep    Review of Systems  Constitutional: Positive for malaise/fatigue. Negative for fever.  HENT: Negative for congestion.   Eyes: Negative for blurred vision.  Respiratory: Negative for cough and shortness of breath.   Cardiovascular: Negative for chest pain, palpitations and leg swelling.  Gastrointestinal: Negative for vomiting.  Musculoskeletal: Negative for back pain.  Skin: Negative for rash.  Neurological: Negative for loss of consciousness and headaches.    Psychiatric/Behavioral: Positive for depression and hallucinations. Negative for suicidal ideas. The patient is nervous/anxious.        Objective:    Physical Exam  Constitutional: She is oriented to person, place, and time. She appears well-developed and well-nourished.  HENT:  Head: Normocephalic and atraumatic.  Eyes: Conjunctivae and EOM are normal.  Neck: Normal range of motion.  Neck supple. No JVD present. Carotid bruit is not present. No thyromegaly present.  Cardiovascular: Normal rate, regular rhythm and normal heart sounds.   No murmur heard. Pulmonary/Chest: Effort normal and breath sounds normal. No respiratory distress. She has no wheezes. She has no rales. She exhibits no tenderness.  Musculoskeletal: She exhibits no edema.  Neurological: She is alert and oriented to person, place, and time.  Psychiatric: Her speech is normal. Her mood appears anxious. She is agitated and actively hallucinating. Thought content is paranoid. She exhibits a depressed mood. She expresses no homicidal and no suicidal ideation. She expresses no suicidal plans and no homicidal plans.  Pt is hearing explosions in her head and states her neighbor is knocking on her door constanting to bother her  Nursing note and vitals reviewed.   BP 118/84 (BP Location: Left Arm, Cuff Size: Normal)   Pulse (!) 59   Temp 97.9 F (36.6 C) (Oral)   Resp 16   Ht 5\' 2"  (1.575 m)   Wt 189 lb 6.4 oz (85.9 kg)   SpO2 98%   BMI 34.64 kg/m  Wt Readings from Last 3 Encounters:  10/05/17 189 lb 6.4 oz (85.9 kg)  09/20/17 190 lb (86.2 kg)  08/16/17 192 lb (87.1 kg)   BP Readings from Last 3 Encounters:  10/05/17 118/84  09/20/17 130/90  08/16/17 116/80     Immunization History  Administered Date(s) Administered  . Hepatitis B, adult 07/30/2016, 09/29/2016  . Influenza Split 09/19/2012  . Influenza Whole 11/08/2009  . Influenza,inj,Quad PF,6+ Mos 11/07/2013, 07/30/2016, 09/20/2017  .  Influenza-Unspecified 09/11/2015  . PPD Test 07/30/2016  . Td 11/08/2009    Health Maintenance  Topic Date Due  . COLONOSCOPY  12/12/2014  . MAMMOGRAM  09/10/2017  . TETANUS/TDAP  11/09/2019  . INFLUENZA VACCINE  Completed  . Hepatitis C Screening  Completed  . HIV Screening  Completed    Lab Results  Component Value Date   WBC 7.2 09/20/2017   HGB 14.1 09/20/2017   HCT 42.6 09/20/2017   PLT 108.0 (L) 09/20/2017   GLUCOSE 125 (H) 09/20/2017   CHOL 203 (H) 09/20/2017   TRIG 201.0 (H) 09/20/2017   HDL 43.90 09/20/2017   LDLDIRECT 121.0 09/20/2017   LDLCALC 80 05/14/2017   ALT 12 09/20/2017   AST 13 09/20/2017   NA 139 09/20/2017   K 3.7 09/20/2017   CL 108 09/20/2017   CREATININE 1.01 09/20/2017   BUN 17 09/20/2017   CO2 21 09/20/2017   TSH 3.62 11/03/2016   INR 0.93 07/19/2010   HGBA1C 5.9 12/08/2012   MICROALBUR 1.1 09/20/2017    Lab Results  Component Value Date   TSH 3.62 11/03/2016   Lab Results  Component Value Date   WBC 7.2 09/20/2017   HGB 14.1 09/20/2017   HCT 42.6 09/20/2017   MCV 93.9 09/20/2017   PLT 108.0 (L) 09/20/2017   Lab Results  Component Value Date   NA 139 09/20/2017   K 3.7 09/20/2017   CO2 21 09/20/2017   GLUCOSE 125 (H) 09/20/2017   BUN 17 09/20/2017   CREATININE 1.01 09/20/2017   BILITOT 0.3 09/20/2017   ALKPHOS 88 09/20/2017   AST 13 09/20/2017   ALT 12 09/20/2017   PROT 7.2 09/20/2017   ALBUMIN 4.2 09/20/2017   CALCIUM 9.2 09/20/2017   GFR 58.66 (L) 09/20/2017   Lab Results  Component Value Date   CHOL 203 (H) 09/20/2017   Lab Results  Component Value Date  HDL 43.90 09/20/2017   Lab Results  Component Value Date   LDLCALC 80 05/14/2017   Lab Results  Component Value Date   TRIG 201.0 (H) 09/20/2017   Lab Results  Component Value Date   CHOLHDL 5 09/20/2017   Lab Results  Component Value Date   HGBA1C 5.9 12/08/2012         Assessment & Plan:   Problem List Items Addressed This Visit       Unprioritized   Auditory hallucination    Check MRI Encouraged pt to talk to neuro about the "explosions " in her head-- when I looked it up it could be a sleep disorder and pt does not sleep well If all is normal -- she will need to consider psych       Relevant Orders   MR Brain Wo Contrast   Thrombocytopenia ([place]) - Primary    Repeat today If worsening---  Refer to hematology      Relevant Orders   CBC with Differential/Platelet      I am having [name] maintain her nitroGLYCERIN, triamcinolone ointment, topiramate, FLUoxetine, rizatriptan, metoprolol succinate, estradiol, promethazine, topiramate, furosemide, and ALPRAZolam.  No orders of the defined types were placed in this encounter.   CMA served as Education administrator during this visit. History, Physical and Plan performed by medical provider. Documentation and orders reviewed and attested to.  [name], DO

## 2017-10-05 NOTE — Patient Instructions (Signed)
Platelet Count Test Why am I having this test? The platelet count test is performed to obtain a measure of how many platelets you have within a specific amount (volume) of blood. Platelets are specialized cells made in your bone marrow that gather at the site of tissue injury to stop bleeding. Most of the platelets in your body can be found in your bloodstream. Fewer platelets are stored in your liver and spleen. Your health care provider may want you to have this test if you have a rash or other medical condition that suggests that you have a low platelet count. This may include one of these conditions that cause excess bleeding or delayed blood clotting, such as:  Petechiae. These are small hemorrhages in the skin that cause a rash. The rash appears as a collection of red-purple pinprick-sized dots.  Heavy menstrual bleeding, if you are female.  Thrombocytopenia. This is a condition in which you have a low platelet count.  Bone marrow failure.  This test may also be used to monitor treatment for those conditions. What kind of sample is taken? A blood sample is required for this test. It is usually collected by inserting a needle into a vein or by sticking a finger with a small needle. How do I prepare for this test? There is no preparation or fasting required for this test. What are the reference ranges? Reference ranges are considered healthy ranges established after testing a large group of healthy people. Reference ranges may vary among different people, labs, and hospitals. It is your responsibility to obtain your test results. Ask the lab or department performing the test when and how you will get your results.  Adult or elderly: 150,000-400,000 per microliter.  Child: 150,000-400,000 per microliter.  Infant: 200,000-475,000 per microliter.  Premature infant: 100,000-300,000 per microliter.  Newborn: 150,000-300,000 per microliter.  What do the results mean? Results that are  higher than the reference range can indicate a number of health conditions. These may include:  Certain types of cancer, such as leukemia or lymphoma.  Polycythemia vera. This is a condition in which the bone marrow is producing excess amounts of all cell types, including platelets.  Postsplenectomy syndrome. This is a condition that can occur after surgery that is done to remove the spleen (splenectomy).  Rheumatoid arthritis.  Iron-deficiency anemia.  Results that are lower than the reference range can also indicate a number of health conditions. These may include:  Hypersplenism. This is a condition in which the spleen is breaking down platelets faster than normal.  Bleeding (hemorrhage).  Immune thrombocytopenia.  Cancer or chemotherapy treatments for cancers such as leukemia.  Thrombotic thrombocytopenia.  HELLP syndrome.  Abnormal thyroid gland function, such as in Graves disease.  Certain inherited disorders that cause a decreased platelet count.  Disseminated intravascular coagulation (DIC). This is a condition in which abnormal blood-clotting processes occur.  Systemic lupus erythematosus (SLE).  Certain types of anemia, such as pernicious and hemolytic anemias.  Infection.  Talk with your health care provider to discuss your results, treatment options, and if necessary, the need for more tests. Talk with your health care provider if you have any questions about your results. This information is not intended to replace advice given to you by your health care provider. Make sure you discuss any questions you have with your health care provider. Document Released: 12/18/2004 Document Revised: 07/29/2016 Document Reviewed: 04/18/2014 Elsevier Interactive Patient Education  [employer].

## 2017-10-05 NOTE — Telephone Encounter (Signed)
Patient seen in office

## 2017-10-06 DIAGNOSIS — D696 Thrombocytopenia, unspecified: Secondary | ICD-10-CM

## 2017-10-06 DIAGNOSIS — R440 Auditory hallucinations: Secondary | ICD-10-CM

## 2017-10-06 HISTORY — DX: Thrombocytopenia, unspecified: D69.6

## 2017-10-06 HISTORY — DX: Auditory hallucinations: R44.0

## 2017-10-06 LAB — CBC WITH DIFFERENTIAL/PLATELET
Basophils Absolute: 0.1 10*3/uL (ref 0.0–0.1)
Basophils Relative: 0.9 % (ref 0.0–3.0)
Eosinophils Absolute: 0.2 10*3/uL (ref 0.0–0.7)
Eosinophils Relative: 2.4 % (ref 0.0–5.0)
HCT: 42 % (ref 36.0–46.0)
Hemoglobin: 13.7 g/dL (ref 12.0–15.0)
Lymphocytes Relative: 22.2 % (ref 12.0–46.0)
Lymphs Abs: 1.7 10*3/uL (ref 0.7–4.0)
MCHC: 32.6 g/dL (ref 30.0–36.0)
MCV: 94.3 fl (ref 78.0–100.0)
Monocytes Absolute: 0.4 10*3/uL (ref 0.1–1.0)
Monocytes Relative: 5.8 % (ref 3.0–12.0)
Neutro Abs: 5.2 10*3/uL (ref 1.4–7.7)
Neutrophils Relative %: 68.7 % (ref 43.0–77.0)
Platelets: 99 10*3/uL — ABNORMAL LOW (ref 150.0–400.0)
RBC: 4.45 Mil/uL (ref 3.87–5.11)
RDW: 13.7 % (ref 11.5–15.5)
WBC: 7.6 10*3/uL (ref 4.0–10.5)

## 2017-10-06 NOTE — Assessment & Plan Note (Signed)
Check MRI Encouraged pt to talk to neuro about the "explosions " in her head-- when I looked it up it could be a sleep disorder and pt does not sleep well If all is normal -- she will need to consider psych

## 2017-10-06 NOTE — Assessment & Plan Note (Signed)
Repeat today If worsening---  Refer to hematology

## 2017-10-07 ENCOUNTER — Other Ambulatory Visit: Payer: Self-pay | Admitting: Family Medicine

## 2017-10-07 ENCOUNTER — Encounter: Payer: Self-pay | Admitting: Neurology

## 2017-10-07 DIAGNOSIS — D696 Thrombocytopenia, unspecified: Secondary | ICD-10-CM

## 2017-11-02 ENCOUNTER — Other Ambulatory Visit: Payer: Self-pay | Admitting: Family Medicine

## 2017-11-02 ENCOUNTER — Ambulatory Visit (INDEPENDENT_AMBULATORY_CARE_PROVIDER_SITE_OTHER): Payer: Medicare Other

## 2017-11-02 DIAGNOSIS — Z111 Encounter for screening for respiratory tuberculosis: Secondary | ICD-10-CM

## 2017-11-02 NOTE — Progress Notes (Signed)
a 

## 2017-11-03 DIAGNOSIS — Z111 Encounter for screening for respiratory tuberculosis: Secondary | ICD-10-CM | POA: Diagnosis not present

## 2017-11-03 NOTE — Telephone Encounter (Signed)
Relation to pt: self Call back number: [phone] Pharmacy: [facility], [place]. [phone] (Phone) [phone] (Fax)     Reason for call:  Patient completely out of ALPRAZolam Duanne Moron) 1 MG tablet and checking on the status of refill request.

## 2017-11-03 NOTE — Telephone Encounter (Signed)
Last filled per database: N/A Last written: 10/04/17 Last ov: 10/05/17 Next ov: 11/04/17 Contract: 12/08/12 UDS: low risk, No UDS on file

## 2017-11-04 ENCOUNTER — Ambulatory Visit: Payer: Medicare Other

## 2017-11-04 ENCOUNTER — Telehealth: Payer: Self-pay

## 2017-11-04 NOTE — Telephone Encounter (Addendum)
Patient no show for PPD read today will have to repeat TB test if she has not had read by another facility today.

## 2017-11-04 NOTE — Telephone Encounter (Signed)
Just leave pt message

## 2017-11-04 NOTE — Telephone Encounter (Signed)
Need uds

## 2017-11-05 ENCOUNTER — Ambulatory Visit: Payer: Medicare Other

## 2017-11-05 DIAGNOSIS — Z111 Encounter for screening for respiratory tuberculosis: Secondary | ICD-10-CM

## 2017-11-05 LAB — TB SKIN TEST
Induration: 0 mm
TB Skin Test: NEGATIVE

## 2017-11-05 NOTE — Progress Notes (Signed)
Patient came in today to have her PPD done the test was negative

## 2017-11-08 NOTE — Telephone Encounter (Signed)
Patient came in on 11/05/17 PPD test read by CMA.

## 2017-11-10 ENCOUNTER — Other Ambulatory Visit: Payer: Self-pay | Admitting: *Deleted

## 2017-11-10 DIAGNOSIS — Z79899 Other long term (current) drug therapy: Secondary | ICD-10-CM

## 2017-11-10 NOTE — Telephone Encounter (Signed)
Left message on machine that patient must come in before running out to do UDS.  Future order placed.

## 2017-11-11 ENCOUNTER — Other Ambulatory Visit (HOSPITAL_BASED_OUTPATIENT_CLINIC_OR_DEPARTMENT_OTHER): Payer: Medicare Other

## 2017-11-11 ENCOUNTER — Ambulatory Visit (HOSPITAL_BASED_OUTPATIENT_CLINIC_OR_DEPARTMENT_OTHER): Payer: Medicare Other | Admitting: Family

## 2017-11-11 ENCOUNTER — Other Ambulatory Visit: Payer: Self-pay | Admitting: Family

## 2017-11-11 ENCOUNTER — Other Ambulatory Visit: Payer: Self-pay | Admitting: Neurology

## 2017-11-11 ENCOUNTER — Encounter: Payer: Self-pay | Admitting: Family

## 2017-11-11 ENCOUNTER — Other Ambulatory Visit: Payer: Self-pay

## 2017-11-11 VITALS — BP 119/79 | HR 64 | Temp 98.0°F | Resp 16 | Wt 182.0 lb

## 2017-11-11 DIAGNOSIS — D6959 Other secondary thrombocytopenia: Secondary | ICD-10-CM

## 2017-11-11 DIAGNOSIS — I10 Essential (primary) hypertension: Secondary | ICD-10-CM

## 2017-11-11 DIAGNOSIS — D696 Thrombocytopenia, unspecified: Secondary | ICD-10-CM

## 2017-11-11 DIAGNOSIS — N189 Chronic kidney disease, unspecified: Secondary | ICD-10-CM

## 2017-11-11 LAB — CMP (CANCER CENTER ONLY)
ALT(SGPT): 24 U/L (ref 10–47)
AST: 21 U/L (ref 11–38)
Albumin: 3.7 g/dL (ref 3.3–5.5)
Alkaline Phosphatase: 85 U/L — ABNORMAL HIGH (ref 26–84)
BUN, Bld: 9 mg/dL (ref 7–22)
CO2: 24 mEq/L (ref 18–33)
Calcium: 9.3 mg/dL (ref 8.0–10.3)
Chloride: 109 mEq/L — ABNORMAL HIGH (ref 98–108)
Creat: 1 mg/dl (ref 0.6–1.2)
Glucose, Bld: 132 mg/dL — ABNORMAL HIGH (ref 73–118)
Potassium: 3.6 mEq/L (ref 3.3–4.7)
Sodium: 143 mEq/L (ref 128–145)
Total Bilirubin: 0.6 mg/dl (ref 0.20–1.60)
Total Protein: 7.6 g/dL (ref 6.4–8.1)

## 2017-11-11 LAB — CBC WITH DIFFERENTIAL (CANCER CENTER ONLY)
BASO#: 0 10*3/uL (ref 0.0–0.2)
BASO%: 0.3 % (ref 0.0–2.0)
EOS%: 1.9 % (ref 0.0–7.0)
Eosinophils Absolute: 0.2 10*3/uL (ref 0.0–0.5)
HCT: 44 % (ref 34.8–46.6)
HGB: 14.4 g/dL (ref 11.6–15.9)
LYMPH#: 1.6 10*3/uL (ref 0.9–3.3)
LYMPH%: 20.3 % (ref 14.0–48.0)
MCH: 30.8 pg (ref 26.0–34.0)
MCHC: 32.7 g/dL (ref 32.0–36.0)
MCV: 94 fL (ref 81–101)
MONO#: 0.6 10*3/uL (ref 0.1–0.9)
MONO%: 7.6 % (ref 0.0–13.0)
NEUT#: 5.4 10*3/uL (ref 1.5–6.5)
NEUT%: 69.9 % (ref 39.6–80.0)
Platelets: 105 10*3/uL — ABNORMAL LOW (ref 145–400)
RBC: 4.68 10*6/uL (ref 3.70–5.32)
RDW: 13.6 % (ref 11.1–15.7)
WBC: 7.7 10*3/uL (ref 3.9–10.0)

## 2017-11-11 LAB — CHCC SATELLITE - SMEAR

## 2017-11-11 LAB — LACTATE DEHYDROGENASE: LDH: 213 U/L (ref 125–245)

## 2017-11-11 NOTE — Telephone Encounter (Signed)
LM on VM for Pt to call me back. She was to taper off topiramate once she started [employer]

## 2017-11-11 NOTE — Progress Notes (Signed)
Hematology/Oncology Consultation   Name: Ebony Garcia      MRN: [id number]    Location: Room/bed info not found  Date: 11/11/2017 Time:9:01 PM   REFERRING PHYSICIAN: Roma Schanz, DO  REASON FOR CONSULT: Thrombocytopenia    DIAGNOSIS: Medication induced thrombocytopenia   HISTORY OF PRESENT ILLNESS: [name] is a very pleasant 66 yo caucasian female with a history of thrombocytopenia since 2014. She has remained asymptomatic with this. She is on quite a few medications  She has had multiple surgeries in the past including: wisdom teeth extraction, hysterectomy, tonsils and adenoids removed with no issue with bleeding. No bruising or petechiae.  She is on quite a few medications which could potentially cause thrombocytopenia.  She has history of anxiety and depression and states that she is under a good deal of stress due to finances and her relationship with 2 of her sons. She admits to feeling depressed but denies feels of harming herself or others.  She has had some SOB and chest tightness off and on. Neither are present at this time. She has seen cardiology in the past.  She feels that she may have had a seizure in the past and is followed by Dr. Tomi Garcia.  She spends a lot of time with her mother with whom she has a good relationship. She is a good support for her. She also enjoys listening to [employer].   She takes Topamax and Maxalt for migraines. She states that she has had these for 30 years.  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  She has 3 sons all of whom are healthy. She has no history of miscarriage. With her second pregnancy she developed placenta previa but was able to carry him to term.  Since her hysterectomy she started estradiol. She is currently weaning down her dose and will hopefully be off soon.  No fever, chills, n/v, cough, rash, dizziness, palpitations, abdominal pain or changes  in bowel or bladder habits.  No swelling, numbness or tingling in her extremeties. She takes lasix daily to help prevent fluid retention.  Her appetite comes and goes. She eats healthy and stays hydrated. She states that her weight with diet changes has dropped 10 lbs in the last 2 months.  No lymphadenopathy found on exam.  She has generalized aches and pains that come and go  No personal cancer history. Family history includes: maternal 1st cousin with hemolytic anemia and pernicious anemia, maternal 1st cousin with pernicious anemia and maternal second cousin with lymphoma.   ROS: All other 10 point review of systems is negative.   PAST MEDICAL HISTORY:   Past Medical History:  Diagnosis Date  . Cancer ([place])   . CHF (congestive heart failure) ([place])    dystolic heart dysfunction  . Chronic kidney disease   . Depression   . Depression with anxiety   . Diabetes mellitus without complication ([place])    Diet controlled  . Hypertension   . Intertrigo   . Migraines   . Panic attacks     ALLERGIES: Allergies  Allergen Reactions  . Amoxicillin-Pot Clavulanate Diarrhea  . Antihistamines, Diphenhydramine-Type Other (See Comments)    Other Reaction: makes pt nervous  . Diphenhydramine Hcl Other (See Comments)    Other Reaction: makes pt nervous  . Amoxicillin-Pot Clavulanate Diarrhea  . Clindamycin     She has not taken but per patient  "her mother  had a reaction and she does not want it ever"  . Hydrocodone-Acetaminophen     REACTION: sensitive to  but can take for extreme pain per patient  . Propoxyphene Hcl Nausea Only  . Propoxyphene N-Acetaminophen Nausea Only  . Sulfa Antibiotics     Unknown   . Tramadol Other (See Comments)    Pt does know what kind of effect  . Lisinopril     Restless , irregular sleep      MEDICATIONS:  Current Outpatient Medications on File Prior to Visit  Medication Sig Dispense Refill  . ALPRAZolam (XANAX) 1 MG tablet TAKE 1 TABLET BY MOUTH AT  BEDTIME AS NEEDED FOR SLEEP 30 tablet 0  . estradiol (ESTRACE) 0.5 MG tablet Take 0.5 mg by mouth daily. 1/2 tab daily    . FLUoxetine (PROZAC) 20 MG capsule TAKE ONE CAPSULE BY MOUTH ONCE DAILY 30 capsule 6  . furosemide (LASIX) 20 MG tablet TAKE ONE TABLET BY MOUTH ONCE DAILY 90 tablet 3  . metoprolol succinate (TOPROL-XL) 50 MG 24 hr tablet Take 1 tablet (50 mg total) by mouth daily. Take with or immediately following a meal. 90 tablet 2  . nitroGLYCERIN (NITROSTAT) 0.4 MG SL tablet Place 1 tablet (0.4 mg total) under the tongue every 5 (five) minutes as needed for chest pain. 25 tablet 3  . promethazine (PHENERGAN) 25 MG tablet TAKE ONE TABLET BY MOUTH EVERY 6 HOURS AS NEEDED FOR NAUSEA AND VOMITING 30 tablet 2  . rizatriptan (MAXALT) 10 MG tablet TAKE 1 TABLET EARLIEST ONSET OF HEADACHE. MAY REPEAT ONCE IN 2 HOURS IF NEEDED 10 tablet 1  . topiramate (TOPAMAX) 100 MG tablet TAKE 1 TABLET BY MOUTH TWICE DAILY 30 tablet 2  . triamcinolone ointment (KENALOG) 0.1 % Apply topically as needed     No current facility-administered medications on file prior to visit.      PAST SURGICAL HISTORY Past Surgical History:  Procedure Laterality Date  . ABDOMINAL HYSTERECTOMY    . BREAST SURGERY    . DILATION AND CURETTAGE OF UTERUS    . NASAL SEPTUM SURGERY    . papaloma Right   . TONSILLECTOMY AND ADENOIDECTOMY    . TOTAL ABDOMINAL HYSTERECTOMY W/ BILATERAL SALPINGOOPHORECTOMY     for fibroids and migraines  . TUBAL LIGATION      FAMILY HISTORY: Family History  Problem Relation Age of Onset  . Crohn's disease Mother   . Atrial fibrillation Mother   . Heart failure Mother   . Bladder Cancer Father   . Cancer Father        bladder  . Bipolar disorder Brother   . Cancer Brother   . Bipolar disorder Sister   . Heart attack Maternal Grandmother        >65  . Cancer Maternal Aunt   . Colon cancer Maternal Uncle   . Cancer Paternal Grandfather   . Cancer Maternal Aunt   . Cancer Cousin    . Anemia Cousin   . Diabetes Neg Hx   . Stroke Neg Hx     SOCIAL HISTORY:  reports that  has never smoked. she has never used smokeless tobacco. She reports that she does not drink alcohol or use drugs.  PERFORMANCE STATUS: The patient's performance status is 0 - Asymptomatic  PHYSICAL EXAM: Most Recent Vital Signs: Blood pressure 119/79, pulse 64, temperature 98 F (36.7 C), temperature source Oral, resp. rate 16, weight 182 lb (82.6 kg), SpO2 99 %. BP 119/79 (BP Location:  Left Arm, Patient Position: Sitting)   Pulse 64   Temp 98 F (36.7 C) (Oral)   Resp 16   Wt 182 lb (82.6 kg)   SpO2 99%   BMI 33.29 kg/m   General Appearance:    Alert, cooperative, no distress, appears stated age  Head:    Normocephalic, without obvious abnormality, atraumatic  Eyes:    PERRL, conjunctiva/corneas clear, EOM's intact, fundi    benign, both eyes        Throat:   Lips, mucosa, and tongue normal; teeth and gums normal  Neck:   Supple, symmetrical, trachea midline, no adenopathy;    thyroid:  no enlargement/tenderness/nodules; no carotid   bruit or JVD  Back:     Symmetric, no curvature, ROM normal, no CVA tenderness  Lungs:     Clear to auscultation bilaterally, respirations unlabored  Chest Wall:    No tenderness or deformity   Heart:    Regular rate and rhythm, S1 and S2 normal, no murmur, rub   or gallop     Abdomen:     Soft, non-tender, bowel sounds active all four quadrants,    no masses, no organomegaly        Extremities:   Extremities normal, atraumatic, no cyanosis or edema  Pulses:   2+ and symmetric all extremities  Skin:   Skin color, texture, turgor normal, no rashes or lesions  Lymph nodes:   Cervical, supraclavicular, and axillary nodes normal  Neurologic:   CNII-XII intact, normal strength, sensation and reflexes    throughout    LABORATORY DATA:  Results for orders placed or performed in visit on 11/11/17 (from the past 48 hour(s))  CBC w/Diff     Status:  Abnormal   Collection Time: 11/11/17  1:39 PM  Result Value Ref Range   WBC 7.7 3.9 - 10.0 10e3/uL   RBC 4.68 3.70 - 5.32 10e6/uL   HGB 14.4 11.6 - 15.9 g/dL   HCT 44.0 34.8 - 46.6 %   MCV 94 81 - 101 fL   MCH 30.8 26.0 - 34.0 pg   MCHC 32.7 32.0 - 36.0 g/dL   RDW 13.6 11.1 - 15.7 %   Platelets 105 Platelet count consistent in citrate (L) 145 - 400 10e3/uL    Comment: Result amended from () (11/11/2017 1:54 PM) to (Platelet count consistent in citrate).   NEUT# 5.4 1.5 - 6.5 10e3/uL   LYMPH# 1.6 0.9 - 3.3 10e3/uL   MONO# 0.6 0.1 - 0.9 10e3/uL   Eosinophils Absolute 0.2 0.0 - 0.5 10e3/uL   BASO# 0.0 0.0 - 0.2 10e3/uL   NEUT% 69.9 39.6 - 80.0 %   LYMPH% 20.3 14.0 - 48.0 %   MONO% 7.6 0.0 - 13.0 %   EOS% 1.9 0.0 - 7.0 %   BASO% 0.3 0.0 - 2.0 %  CMP STAT     Status: Abnormal   Collection Time: 11/11/17  1:39 PM  Result Value Ref Range   Sodium 143 128 - 145 mEq/L   Potassium 3.6 3.3 - 4.7 mEq/L   Chloride 109 (H) 98 - 108 mEq/L   CO2 24 18 - 33 mEq/L   Glucose, Bld 132 (H) 73 - 118 mg/dL   BUN, Bld 9 7 - 22 mg/dL   Creat 1.0 0.6 - 1.2 mg/dl   Total Bilirubin 0.60 0.20 - 1.60 mg/dl   Alkaline Phosphatase 85 (H) 26 - 84 U/L   AST 21 11 - 38 U/L   ALT(SGPT) 24  10 - 47 U/L   Total Protein 7.6 6.4 - 8.1 g/dL   Albumin 3.7 3.3 - 5.5 g/dL   Calcium 9.3 8.0 - 10.3 mg/dL  Smear     Status: None   Collection Time: 11/11/17  1:39 PM  Result Value Ref Range   Smear Result Smear Available   LDH     Status: None   Collection Time: 11/11/17  1:39 PM  Result Value Ref Range   LDH 213 125 - 245 U/L      RADIOGRAPHY: No results found.     PATHOLOGY: None  ASSESSMENT/PLAN: [name] is a very pleasant 66 yo caucasian female with a history of thrombocytopenia since 2014. Her counts remain stable with platelets at 105, no anemia, WBC count is 7.7. She is asymptomatic with this. No bleeding, bruising or petechiae.  She is on quite a few medications which could cause  thrombocytopenia.  We will continue to follow along with her and plan to see her back in another 4 months for follow-up.   All questions were answered and she is in agreement with the plan. She will contact our office with any  questions or concerns. We can certainly see her much sooner if necessary.  She was discussed with and also seen by Dr. Marin Olp and he is in agreement with the aforementioned.   [facility]     Addendum: I saw and examined the patient with Wissam Resor.  I agree with the above assessment.  I have to believe that she has medication-induced thrombocytopenia.  I looked at her blood under the microscope.  Her platelets appear to be normal.  They were well granulated.  Her white blood cells and red blood cells also appear to be mature.  I saw no hypersegmented polys.  There were no nucleated red blood cells.  Her platelet count really is not all that low.  She is totally asymptomatic.  I would not stop any of her medications as she appears to need these.  I would like to follow-up with her.  I do not see a need for a bone marrow test right now.  We will plan to get her back in 4 months.  We spent about 40 minutes with her today.  We answered her questions.  We reassured her and that made her feel better.  Lattie Haw, MD

## 2017-11-12 ENCOUNTER — Telehealth: Payer: Self-pay

## 2017-11-12 NOTE — Telephone Encounter (Signed)
Pt left a message saying she was returning your call

## 2017-11-12 NOTE — Telephone Encounter (Signed)
Called and spoke with Pt. She tried the aimovig for the 2 free trial doses, states it was helping her headaches, but has noticed an unfamiliar skin pattern that was not there prior to aimovig as well as a jerking in her ankle. She wishes to just take Topiramate 100mg . Sent in Rx.

## 2017-11-17 ENCOUNTER — Telehealth: Payer: Self-pay | Admitting: Neurology

## 2017-11-17 ENCOUNTER — Ambulatory Visit: Payer: Medicare Other | Admitting: Neurology

## 2017-11-17 NOTE — Telephone Encounter (Signed)
Patient state that they dont make the 100 in the tompax anymore

## 2017-11-17 NOTE — Telephone Encounter (Signed)
Called and spoke with Pt. Advsd her I had spoken with [place], it's not that the 100mg  topiramate is not made any longer, it is on manufacturer backorder. I changed the Rx to 50mg  tablets sig: 2 tabs BID

## 2017-11-18 ENCOUNTER — Encounter: Payer: Self-pay | Admitting: Neurology

## 2017-11-18 ENCOUNTER — Ambulatory Visit (INDEPENDENT_AMBULATORY_CARE_PROVIDER_SITE_OTHER): Payer: Medicare Other | Admitting: Neurology

## 2017-11-18 VITALS — BP 108/76 | HR 64 | Ht 62.0 in | Wt 184.2 lb

## 2017-11-18 DIAGNOSIS — G43009 Migraine without aura, not intractable, without status migrainosus: Secondary | ICD-10-CM

## 2017-11-18 NOTE — Patient Instructions (Signed)
1.  Continue topiramate 100mg  twice daily 2.  Use rizatriptan as prescribed 3.  Follow up in 5 months.

## 2017-11-18 NOTE — Progress Notes (Signed)
NEUROLOGY FOLLOW UP OFFICE NOTE  Ebony Garcia [id number]  HISTORY OF PRESENT ILLNESS: Ebony Garcia is a 66 year old right-handed woman with hypertension, CHF, renal calculi, depression, and hyperlipidemia and who follows up for migraine headache.    UPDATE:  Aimovig was helpful for the headaches..  However, she noted abnormal skin pattern, jerking of the ankle.  One night, she woke up and started having jerking of the head.  Aimovig was discontinued and she was restarted on topiramate.  She is doing well Intensity:  7/10 Duration:  30 minutes with Maxalt. Frequency:  One day a month. Current NSAIDS:  Toradol Current analgesics:  no. 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  She is feeling well.   She still occasionally hears voices such as her mother's voice,or knocking that isn't there.  However, it is improved.  She does not wish to see a psychiatrist.  She thinks it is medication-related.   HISTORY: Onset:  Since late 60s Location: usually left sided, periorbital, and back of head.  More recently, holocephalic Quality:  Sharp, aching pressure.  Non-throbbing Initial intensity:  7/10; Sept: 7/10 Associated symptoms:  Nausea, photophobia and phonophobia.  No visual disturbance, osmophobia or vomiting Aura:  no Initial duration:  3 days; Sept: 30 minutes with Maxalt Initial frequency:  25 headache days per month; Sept: 25 headache days per month Triggers/exacerbating factors:  stress Relieving factors:  Cold mask, heating pack on neck and shoulders   Past NSAIDs:  naproxen (ineffective),  indomethacin, toradol tablet (ineffective), Toradol 60mg  IM (effective), Cambia (side effects) Past analgesics:  Tylenol (ineffective), Excedrin (ineffective), Lidocaine nasal drops (effective but caused hives), tramadol (side effects), Midrin (effective but made her sleepy) Past triptans/ergots:  sumatriptan 100mg , Zomig 5mg  (effective), Frova, Relpax, DHE (effective)   Past antiemetic:  Reglan Past anxiolytics:  Vicodin Past muscle relaxants:  cyclobenzaprine Past antihypertensives:  propranolol (ineffective), atenolol (ineffective), lisinopril, losartan, HCTZ Past antidepressants:  amitriptyline (side effects), venlafaxine (ineffective) Past antiepileptics:  Depakote (hair loss), lamotrigine (side effects), zonisamide (side effects) Past CGRP inhibitor:  Aimovig (effective but concerned about side effects- ankle jerking, abnormal skin pattern). Past vitamins and supplements:  magnesium (ineffective), feverfew (ineffective) Other past therapy:  Botox (one round, flu like symptoms, unable to afford 20% copay), occipital nerve blocks, trigger point injections, alternating heat/ice   Family history of headache:  Mother, grandmother, great-aunt, cousins.    08/16/08 MRI Brain w/wo performed for "explosive headaches": nonspecific punctate hyperintensities in the subcortical white matter. 08/16/08 MRA Head: Unremarkable.  There is mild attenuation in the vertebrobasilar junction and proximal basilar artery which is likely artifact.  PAST MEDICAL HISTORY: Past Medical History:  Diagnosis Date  . Cancer ([place])   . CHF (congestive heart failure) ([place])    dystolic heart dysfunction  . Chronic kidney disease   . Depression   . Depression with anxiety   . Diabetes mellitus without complication ([place])    Diet controlled  . Hypertension   . Intertrigo   . Migraines   . Panic attacks     MEDICATIONS: Current Outpatient Medications on File Prior to Visit  Medication Sig Dispense Refill  .  ALPRAZolam (XANAX) 1 MG tablet TAKE 1 TABLET BY MOUTH AT BEDTIME AS NEEDED FOR SLEEP 30 tablet 0  . estradiol (ESTRACE) 0.5 MG tablet Take 0.5 mg by mouth daily. 1/2 tab daily    . FLUoxetine (PROZAC) 20 MG capsule TAKE ONE CAPSULE BY MOUTH ONCE DAILY 30 capsule 6  . furosemide (LASIX) 20 MG tablet TAKE ONE TABLET BY MOUTH ONCE DAILY 90 tablet 3  . metoprolol succinate (TOPROL-XL) 50 MG 24 hr tablet Take 1 tablet (50 mg total) by mouth daily. Take with or immediately following a meal. 90 tablet 2  . nitroGLYCERIN (NITROSTAT) 0.4 MG SL tablet Place 1 tablet (0.4 mg total) under the tongue every 5 (five) minutes as needed for chest pain. 25 tablet 3  . promethazine (PHENERGAN) 25 MG tablet TAKE ONE TABLET BY MOUTH EVERY 6 HOURS AS NEEDED FOR NAUSEA AND VOMITING 30 tablet 2  . rizatriptan (MAXALT) 10 MG tablet TAKE 1 TABLET EARLIEST ONSET OF HEADACHE. MAY REPEAT ONCE IN 2 HOURS IF NEEDED 10 tablet 1  . topiramate (TOPAMAX) 100 MG tablet TAKE 1 TABLET BY MOUTH TWICE DAILY 60 tablet 5  . triamcinolone ointment (KENALOG) 0.1 % Apply topically as needed     No current facility-administered medications on file prior to visit.     ALLERGIES: Allergies  Allergen Reactions  . Amoxicillin-Pot Clavulanate Diarrhea  . Antihistamines, Diphenhydramine-Type Other (See Comments)    Other Reaction: makes pt nervous  . Diphenhydramine Hcl Other (See Comments)    Other Reaction: makes pt nervous  . Amoxicillin-Pot Clavulanate Diarrhea  . Clindamycin     She has not taken but per patient  "her mother had a reaction and she does not want it ever"  . Hydrocodone-Acetaminophen     REACTION: sensitive to  but can take for extreme pain per patient  . Propoxyphene Hcl Nausea Only  . Propoxyphene N-Acetaminophen Nausea Only  . Sulfa Antibiotics     Unknown   . Tramadol Other (See Comments)    Pt does know what kind of effect  . Lisinopril     Restless , irregular sleep    FAMILY HISTORY: Family History    Problem Relation Age of Onset  . Crohn's disease Mother   . Atrial fibrillation Mother   . Heart failure Mother   . Bladder Cancer Father   . Cancer Father        bladder  . Bipolar disorder Brother   . Cancer Brother   . Bipolar disorder Sister   . Heart attack Maternal Grandmother        >65  . Cancer Maternal Aunt   . Colon cancer Maternal Uncle   . Cancer Paternal Grandfather   . Cancer Maternal Aunt   . Cancer Cousin   . Anemia Cousin   . Diabetes Neg Hx   . Stroke Neg Hx     SOCIAL HISTORY: Social History   Socioeconomic History  . Marital status: Divorced    Spouse name: Not on file  . Number of children: 3  . Years of education: Not on file  . Highest education level: Not on file  Social Needs  . Financial resource strain: Not on file  . Food insecurity - worry: Not on file  . Food insecurity - inability: Not on file  . Transportation needs - medical: Not on file  . Transportation needs - non-medical: Not on file  Occupational History    Employer: UNEMPLOYED  Tobacco Use  . Smoking status:  Never Smoker  . Smokeless tobacco: Never Used  Substance and Sexual Activity  . Alcohol use: No    Alcohol/week: 0.0 oz  . Drug use: No  . Sexual activity: Yes    Partners: Male  Other Topics Concern  . Not on file  Social History Narrative   Lives alone.            REVIEW OF SYSTEMS: Constitutional: No fevers, chills, or sweats, no generalized fatigue, change in appetite Eyes: No visual changes, double vision, eye pain Ear, nose and throat: No hearing loss, ear pain, nasal congestion, sore throat Cardiovascular: No chest pain, palpitations Respiratory:  No shortness of breath at rest or with exertion, wheezes GastrointestinaI: No nausea, vomiting, diarrhea, abdominal pain, fecal incontinence Genitourinary:  No dysuria, urinary retention or frequency Musculoskeletal:  No neck pain, back pain Integumentary: No rash, pruritus, skin lesions Neurological: as  above Psychiatric: No depression, insomnia, anxiety Endocrine: No palpitations, fatigue, diaphoresis, mood swings, change in appetite, change in weight, increased thirst Hematologic/Lymphatic:  No purpura, petechiae. Allergic/Immunologic: no itchy/runny eyes, nasal congestion, recent allergic reactions, rashes  PHYSICAL EXAM: Vitals:   11/18/17 1125  BP: 108/76  Pulse: 64  SpO2: 97%   General: No acute distress.  Patient appears well-groomed.   Head:  Normocephalic/atraumatic Eyes:  Fundi examined but not visualized Neck: supple, no paraspinal tenderness, full range of motion Heart:  Regular rate and rhythm Lungs:  Clear to auscultation bilaterally Back: No paraspinal tenderness Neurological Exam: alert and oriented to person, place, and time. Attention span and concentration intact, recent and remote memory intact, fund of knowledge intact.  Speech fluent and not dysarthric, language intact.  CN II-XII intact. Bulk and tone normal, muscle strength 5/5 throughout.  Sensation to light touch  intact.  Deep tendon reflexes 2+ throughout.  Finger to nose testing intact.  Gait normal  IMPRESSION: Migraine, improved  PLAN: 1.  Continue topiramate 100mg  twice daily 2.  Maxalt as needed 3.  Follow up in 5 months.  Metta Clines, DO  CC:  Roma Schanz, DO

## 2017-11-24 ENCOUNTER — Other Ambulatory Visit: Payer: Medicare Other

## 2017-11-24 DIAGNOSIS — Z79899 Other long term (current) drug therapy: Secondary | ICD-10-CM

## 2017-11-27 ENCOUNTER — Other Ambulatory Visit: Payer: Self-pay | Admitting: Neurology

## 2017-11-28 LAB — PAIN MGMT, PROFILE 8 W/CONF, U
6 Acetylmorphine: NEGATIVE ng/mL (ref <10)
Alcohol Metabolites: NEGATIVE ng/mL (ref <500)
Alphahydroxyalprazolam: 163 ng/mL — ABNORMAL HIGH (ref <25)
Alphahydroxymidazolam: NEGATIVE ng/mL (ref <50)
Alphahydroxytriazolam: NEGATIVE ng/mL (ref <50)
Aminoclonazepam: NEGATIVE ng/mL (ref <25)
Amphetamines: NEGATIVE ng/mL (ref <500)
Benzodiazepines: POSITIVE ng/mL — AB (ref <100)
Buprenorphine, Urine: NEGATIVE ng/mL (ref <5)
Cocaine Metabolite: NEGATIVE ng/mL (ref <150)
Creatinine: 84.2 mg/dL
Hydroxyethylflurazepam: NEGATIVE ng/mL (ref <50)
Lorazepam: NEGATIVE ng/mL (ref <50)
MDMA: NEGATIVE ng/mL (ref <500)
Marijuana Metabolite: NEGATIVE ng/mL (ref <20)
Nordiazepam: NEGATIVE ng/mL (ref <50)
Opiates: NEGATIVE ng/mL (ref <100)
Oxazepam: NEGATIVE ng/mL (ref <50)
Oxidant: NEGATIVE ug/mL (ref <200)
Oxycodone: NEGATIVE ng/mL (ref <100)
Temazepam: NEGATIVE ng/mL (ref <50)
pH: 6.78 (ref 4.5–9.0)

## 2017-12-01 ENCOUNTER — Other Ambulatory Visit: Payer: Self-pay | Admitting: Family Medicine

## 2017-12-01 NOTE — Telephone Encounter (Signed)
Pt is requesting refill on alprazolam   Last OV: 10/05/2017 Last Fill: 11/04/2017 #30 and 0RF UDS: 11/24/2017 Low risk  Will you refill in PCP absence?

## 2017-12-02 NOTE — Telephone Encounter (Signed)
done

## 2017-12-09 ENCOUNTER — Encounter: Payer: Self-pay | Admitting: Neurology

## 2017-12-10 NOTE — Telephone Encounter (Signed)
Pt left a message regarding a prescription to [facility]

## 2017-12-13 ENCOUNTER — Encounter: Payer: Self-pay | Admitting: Neurology

## 2017-12-14 ENCOUNTER — Other Ambulatory Visit: Payer: Self-pay

## 2017-12-14 MED ORDER — NORTRIPTYLINE HCL 10 MG PO CAPS: 10.0000 mg | ORAL_CAPSULE | Freq: Every day | ORAL | 3 refills | Status: DC

## 2017-12-20 ENCOUNTER — Encounter: Payer: Self-pay | Admitting: Neurology

## 2017-12-22 NOTE — Progress Notes (Signed)
Rcvd approval for rizatriptan 10mg  valid 12/16/17-12/06/18

## 2017-12-27 ENCOUNTER — Other Ambulatory Visit: Payer: Self-pay | Admitting: Internal Medicine

## 2017-12-30 DIAGNOSIS — G43709 Chronic migraine without aura, not intractable, without status migrainosus: Secondary | ICD-10-CM

## 2017-12-30 DIAGNOSIS — IMO0002 Reserved for concepts with insufficient information to code with codable children: Secondary | ICD-10-CM

## 2017-12-30 NOTE — Progress Notes (Addendum)
Faxing form to [employer] for Ajovy, sent Rx to [facility]

## 2017-12-31 MED ORDER — FREMANEZUMAB-VFRM 225 MG/1.5ML ~~LOC~~ SOSY: 225.0000 mg | PREFILLED_SYRINGE | SUBCUTANEOUS | 11 refills | Status: DC

## 2017-12-31 NOTE — Addendum Note (Signed)
Addended by: Clois Comber on: 12/31/2017 10:33 AM   Modules accepted: Orders

## 2018-01-03 ENCOUNTER — Telehealth: Payer: Self-pay | Admitting: Family Medicine

## 2018-01-03 NOTE — Telephone Encounter (Signed)
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

## 2018-01-03 NOTE — Telephone Encounter (Signed)
Copied from [place] [id number]. Topic: Quick Communication - Rx Refill/Question >> Jan 03, 2018  9:26 AM Robina Ade, Helene Kelp D wrote: Medication: ALPRAZolam Duanne Moron) 1 MG tablet and FLUoxetine (PROZAC) 20 MG capsule.  Has the patient contacted their pharmacy? Yes   (Agent: If no, request that the patient contact the pharmacy for the refill.) Patient medication were sent to wrong provider on 1/21 therefore it has not been filed and patient is out of medication and would like this today.   Preferred Pharmacy (with phone number or street name): [place], [place].   Agent: Please be advised that RX refills may take up to 3 business days. We ask that you follow-up with your pharmacy.

## 2018-01-04 NOTE — Telephone Encounter (Signed)
See refill notes

## 2018-01-04 NOTE — Telephone Encounter (Signed)
[facility] requesting rx for alprazolam  Database ran 11/24/18  Last written:12/02/17 Last ov: 10/05/17 Next ov: none Contract: 11/24/17 UDS: 11/24/18

## 2018-01-20 ENCOUNTER — Encounter: Payer: Self-pay | Admitting: Neurology

## 2018-01-25 ENCOUNTER — Encounter: Payer: Self-pay | Admitting: Neurology

## 2018-01-26 ENCOUNTER — Other Ambulatory Visit: Payer: Self-pay

## 2018-01-26 NOTE — Telephone Encounter (Signed)
Called and spoke with Pt, advsd her NOT to start the nortriptyline. I will discuss with Dr Tomi Likens and return her call tomorrow. Pt wants something for headaches, is only taking tylenol. She does not wish to try another CGRP.

## 2018-02-01 ENCOUNTER — Telehealth: Payer: Self-pay | Admitting: Neurology

## 2018-02-01 ENCOUNTER — Other Ambulatory Visit: Payer: Self-pay | Admitting: Family Medicine

## 2018-02-01 NOTE — Telephone Encounter (Signed)
Prior authorization for Ajovy submitted via covermymeds.

## 2018-02-02 NOTE — Telephone Encounter (Signed)
Received approval for Ajovy from [place] [employer] valid 02/01/18-02/01/2019.

## 2018-02-02 NOTE — Telephone Encounter (Signed)
[facility] requesting refill for alprazolam  Database ran on 11/24/17 and is in media  Last written: 01/04/18 Last ov:10/05/17 Next ov: has an AWV scheduled 05/17/18 Contract: 11/24/18 UDS: 11/24/18

## 2018-03-02 ENCOUNTER — Other Ambulatory Visit: Payer: Self-pay | Admitting: Family Medicine

## 2018-03-03 ENCOUNTER — Encounter: Payer: Self-pay | Admitting: Family Medicine

## 2018-03-03 ENCOUNTER — Ambulatory Visit (INDEPENDENT_AMBULATORY_CARE_PROVIDER_SITE_OTHER): Payer: Medicare Other | Admitting: Family Medicine

## 2018-03-03 DIAGNOSIS — G4489 Other headache syndrome: Secondary | ICD-10-CM

## 2018-03-03 MED ORDER — TIZANIDINE HCL 4 MG PO TABS: 4.0000 mg | ORAL_TABLET | Freq: Four times a day (QID) | ORAL | 1 refills | Status: DC | PRN

## 2018-03-03 NOTE — Progress Notes (Signed)
Patient ID: Ebony Garcia, female   DOB: May 12, 1951, 67 y.o.   MRN: [id number]    Subjective:  I acted as a Education administrator for Dr. Carollee Herter.  Ebony Garcia, Ebony Garcia   Patient ID: Ebony Garcia, female    DOB: 1951/07/12, 67 y.o.   MRN: [id number]  Chief Complaint  Patient presents with  . Headache    HPI  Patient is in today for headaches.  Patient states that her neurologist is unable to help her anymore.  Patient Care Team: Carollee Herter, Alferd Apa, DO as PCP - General (Family Medicine) Janann August, MD as Referring Physician (Dermatology) Aloha Gell, MD as Consulting Physician (Obstetrics and Gynecology) Pieter Partridge, DO as Consulting Physician (Neurology) [place], Jennefer Bravo, MD as Consulting Physician (Dermatology) Minus Breeding, MD as Consulting Physician (Cardiology) Karie Georges, OD as Consulting Physician (Optometry) Eula Listen, DDS as Consulting Physician (Dentistry) Bunnie Domino, LPN as Licensed Practical Nurse   Past Medical History:  Diagnosis Date  . Cancer ([place])   . CHF (congestive heart failure) ([place])    dystolic heart dysfunction  . Chronic kidney disease   . Depression   . Depression with anxiety   . Diabetes mellitus without complication ([place])    Diet controlled  . Hypertension   . Intertrigo   . Migraines   . Panic attacks     Past Surgical History:  Procedure Laterality Date  . ABDOMINAL HYSTERECTOMY    . BREAST SURGERY    . DILATION AND CURETTAGE OF UTERUS    . NASAL SEPTUM SURGERY    . papaloma Right   . TONSILLECTOMY AND ADENOIDECTOMY    . TOTAL ABDOMINAL HYSTERECTOMY W/ BILATERAL SALPINGOOPHORECTOMY     for fibroids and migraines  . TUBAL LIGATION      Family History  Problem Relation Age of Onset  . Crohn's disease Mother   . Atrial fibrillation Mother   . Heart failure Mother   . Bladder Cancer Father   . Cancer Father        bladder  . Bipolar disorder Brother   . Cancer Brother   . Bipolar disorder Sister   .  Heart attack Maternal Grandmother        >65  . Cancer Maternal Aunt   . Colon cancer Maternal Uncle   . Cancer Paternal Grandfather   . Cancer Maternal Aunt   . Cancer Cousin   . Anemia Cousin   . Diabetes Neg Hx   . Stroke Neg Hx     Social History   Socioeconomic History  . Marital status: Divorced    Spouse name: Not on file  . Number of children: 3  . Years of education: Not on file  . Highest education level: Not on file  Occupational History    Employer: UNEMPLOYED  Social Needs  . Financial resource strain: Not on file  . Food insecurity:    Worry: Not on file    Inability: Not on file  . Transportation needs:    Medical: Not on file    Non-medical: Not on file  Tobacco Use  . Smoking status: Never Smoker  . Smokeless tobacco: Never Used  Substance and Sexual Activity  . Alcohol use: No    Alcohol/week: 0.0 oz  . Drug use: No  . Sexual activity: Yes    Partners: Male  Lifestyle  . Physical activity:    Days per week: Not on file    Minutes per session: Not on file  .  Stress: Not on file  Relationships  . Social connections:    Talks on phone: Not on file    Gets together: Not on file    Attends religious service: Not on file    Active member of club or organization: Not on file    Attends meetings of clubs or organizations: Not on file    Relationship status: Not on file  . Intimate partner violence:    Fear of current or ex partner: Not on file    Emotionally abused: Not on file    Physically abused: Not on file    Forced sexual activity: Not on file  Other Topics Concern  . Not on file  Social History Narrative   Lives alone.            Outpatient Medications Prior to Visit  Medication Sig Dispense Refill  . ALPRAZolam (XANAX) 1 MG tablet TAKE 1 TABLET BY MOUTH AT BEDTIME AS NEEDED FOR SLEEP 30 tablet 0  . estradiol (ESTRACE) 0.5 MG tablet Take 0.5 mg by mouth daily. 1/2 tab daily    . FLUoxetine (PROZAC) 20 MG capsule TAKE 1 CAPSULE BY  MOUTH ONCE DAILY 30 capsule 6  . furosemide (LASIX) 20 MG tablet TAKE ONE TABLET BY MOUTH ONCE DAILY 90 tablet 3  . metoprolol succinate (TOPROL-XL) 50 MG 24 hr tablet Take 1 tablet (50 mg total) by mouth daily. Take with or immediately following a meal. 90 tablet 2  . nitroGLYCERIN (NITROSTAT) 0.4 MG SL tablet Place 1 tablet (0.4 mg total) under the tongue every 5 (five) minutes as needed for chest pain. 25 tablet 3  . promethazine (PHENERGAN) 25 MG tablet TAKE ONE TABLET BY MOUTH EVERY 6 HOURS AS NEEDED FOR NAUSEA AND VOMITING 30 tablet 2  . rizatriptan (MAXALT) 10 MG tablet TAKE 1 TABLET EARLIEST ONSET OF HEADACHE. MAY REPEAT ONCE IN 2 HOURS IF NEEDED 10 tablet 1  . topiramate (TOPAMAX) 100 MG tablet TAKE 1 TABLET BY MOUTH TWICE DAILY 60 tablet 5  . triamcinolone ointment (KENALOG) 0.1 % Apply topically as needed    . Fremanezumab-vfrm (AJOVY) 225 MG/1.5ML SOSY Inject 225 mg into the skin every 30 (thirty) days. 1 Syringe 11  . nortriptyline (PAMELOR) 10 MG capsule Take 1 capsule (10 mg total) by mouth at bedtime. 30 capsule 3   No facility-administered medications prior to visit.     Allergies  Allergen Reactions  . Amoxicillin-Pot Clavulanate Diarrhea  . Antihistamines, Diphenhydramine-Type Other (See Comments)    Other Reaction: makes pt nervous  . Diphenhydramine Hcl Other (See Comments)    Other Reaction: makes pt nervous  . Amoxicillin-Pot Clavulanate Diarrhea  . Clindamycin     She has not taken but per patient  "her mother had a reaction and she does not want it ever"  . Hydrocodone-Acetaminophen     REACTION: sensitive to  but can take for extreme pain per patient  . Nortriptyline   . Propoxyphene Hcl Nausea Only  . Propoxyphene N-Acetaminophen Nausea Only  . Sulfa Antibiotics     Unknown   . Tramadol Other (See Comments)    Pt does know what kind of effect  . Fremanezumab-Vfrm Rash and Other (See Comments)    Seizure, jerking  . Lisinopril     Restless , irregular  sleep    Review of Systems  Constitutional: Negative for fever and malaise/fatigue.  HENT: Negative for congestion.   Eyes: Negative for blurred vision.  Respiratory: Negative for cough and shortness  of breath.   Cardiovascular: Negative for chest pain, palpitations and leg swelling.  Gastrointestinal: Negative for vomiting.  Musculoskeletal: Negative for back pain.  Skin: Negative for rash.  Neurological: Positive for headaches. Negative for loss of consciousness.       Objective:    Physical Exam  Constitutional: She is oriented to person, place, and time. She appears well-developed and well-nourished.  HENT:  Head: Normocephalic and atraumatic.  Right Ear: Hearing, tympanic membrane, external ear and ear canal normal.  Left Ear: Hearing, tympanic membrane, external ear and ear canal normal. No decreased hearing is noted.  Nose: Nose normal. Right sinus exhibits no maxillary sinus tenderness and no frontal sinus tenderness. Left sinus exhibits no maxillary sinus tenderness and no frontal sinus tenderness.  Mouth/Throat: Oropharynx is clear and moist and mucous membranes are normal.  Eyes: Conjunctivae and EOM are normal.  Neck: Normal range of motion. Neck supple. No JVD present. Carotid bruit is not present. No thyromegaly present.  Cardiovascular: Normal rate, regular rhythm and normal heart sounds.  No murmur heard. Pulmonary/Chest: Effort normal and breath sounds normal. No respiratory distress. She has no wheezes. She has no rales. She exhibits no tenderness.  Musculoskeletal: She exhibits no edema.  Neurological: She is alert and oriented to person, place, and time.  Psychiatric: She has a normal mood and affect.  Nursing note and vitals reviewed.   BP 118/86 (BP Location: Right Arm, Cuff Size: Normal)   Pulse (!) 55   Temp 97.7 F (36.5 C) (Oral)   Resp 16   Ht 5\' 2"  (1.575 m)   Wt 172 lb 6.4 oz (78.2 kg)   SpO2 98%   BMI 31.53 kg/m  Wt Readings from Last 3  Encounters:  03/03/18 172 lb 6.4 oz (78.2 kg)  11/18/17 184 lb 3.2 oz (83.6 kg)  11/11/17 182 lb (82.6 kg)   BP Readings from Last 3 Encounters:  03/03/18 118/86  11/18/17 108/76  11/11/17 119/79     Immunization History  Administered Date(s) Administered  . Hepatitis B, adult 07/30/2016, 09/29/2016  . Influenza Split 09/19/2012  . Influenza Whole 11/08/2009  . Influenza,inj,Quad PF,6+ Mos 11/07/2013, 07/30/2016, 09/20/2017  . Influenza-Unspecified 09/11/2015  . PPD Test 07/30/2016, 11/03/2017  . Td 11/08/2009    Health Maintenance  Topic Date Due  . COLONOSCOPY  12/12/2014  . MAMMOGRAM  09/10/2017  . TETANUS/TDAP  11/09/2019  . INFLUENZA VACCINE  Completed  . Hepatitis C Screening  Completed  . HIV Screening  Completed    Lab Results  Component Value Date   WBC 7.7 11/11/2017   HGB 14.4 11/11/2017   HCT 44.0 11/11/2017   PLT 105 Platelet count consistent in citrate (L) 11/11/2017   GLUCOSE 132 (H) 11/11/2017   CHOL 203 (H) 09/20/2017   TRIG 201.0 (H) 09/20/2017   HDL 43.90 09/20/2017   LDLDIRECT 121.0 09/20/2017   LDLCALC 80 05/14/2017   ALT 24 11/11/2017   AST 21 11/11/2017   NA 143 11/11/2017   K 3.6 11/11/2017   CL 109 (H) 11/11/2017   CREATININE 1.0 11/11/2017   BUN 9 11/11/2017   CO2 24 11/11/2017   TSH 3.62 11/03/2016   INR 0.93 07/19/2010   HGBA1C 5.9 12/08/2012   MICROALBUR 1.1 09/20/2017    Lab Results  Component Value Date   TSH 3.62 11/03/2016   Lab Results  Component Value Date   WBC 7.7 11/11/2017   HGB 14.4 11/11/2017   HCT 44.0 11/11/2017   MCV 94 11/11/2017  PLT 105 Platelet count consistent in citrate (L) 11/11/2017   Lab Results  Component Value Date   NA 143 11/11/2017   K 3.6 11/11/2017   CO2 24 11/11/2017   GLUCOSE 132 (H) 11/11/2017   BUN 9 11/11/2017   CREATININE 1.0 11/11/2017   BILITOT 0.60 11/11/2017   ALKPHOS 85 (H) 11/11/2017   AST 21 11/11/2017   ALT 24 11/11/2017   PROT 7.6 11/11/2017   ALBUMIN 3.7  11/11/2017   CALCIUM 9.3 11/11/2017   GFR 58.66 (L) 09/20/2017   Lab Results  Component Value Date   CHOL 203 (H) 09/20/2017   Lab Results  Component Value Date   HDL 43.90 09/20/2017   Lab Results  Component Value Date   LDLCALC 80 05/14/2017   Lab Results  Component Value Date   TRIG 201.0 (H) 09/20/2017   Lab Results  Component Value Date   CHOLHDL 5 09/20/2017   Lab Results  Component Value Date   HGBA1C 5.9 12/08/2012         Assessment & Plan:   Problem List Items Addressed This Visit    None    Visit Diagnoses    Other headache syndrome       Relevant Medications   tiZANidine (ZANAFLEX) 4 MG tablet   Other Relevant Orders   MR Brain Wo Contrast    gave pt -- pt assistance forms for [employer] for maxalt  rto prn I have discontinued Nevin Bloodgood T. Schlagel's nortriptyline and Fremanezumab-vfrm. I am also having her start on tiZANidine. Additionally, I am having her maintain her nitroGLYCERIN, triamcinolone ointment, metoprolol succinate, estradiol, promethazine, furosemide, topiramate, rizatriptan, FLUoxetine, and ALPRAZolam.  Meds ordered this encounter  Medications  . tiZANidine (ZANAFLEX) 4 MG tablet    Sig: Take 1 tablet (4 mg total) by mouth every 6 (six) hours as needed for muscle spasms.    Dispense:  30 tablet    Refill:  1    CMA served as scribe during this visit. History, Physical and Plan performed by medical provider. Documentation and orders reviewed and attested to.  [name], DO

## 2018-03-03 NOTE — Patient Instructions (Signed)

## 2018-03-04 ENCOUNTER — Encounter: Payer: Self-pay | Admitting: Family Medicine

## 2018-03-04 ENCOUNTER — Telehealth: Payer: Self-pay | Admitting: Family Medicine

## 2018-03-04 NOTE — Telephone Encounter (Signed)
Copied from [place] [id number]. Topic: Quick Communication - Rx Refill/Question >> Mar 04, 2018 12:18 PM Boyd Kerbs wrote:  Medication: ALPRAZolam Duanne Moron) 1 MG tablet  [facility] has requested and not heard back.  Has the patient contacted their pharmacy? Yes.   (Agent: If no, request that the patient contact the pharmacy for the refill.)  Preferred Pharmacy (with phone number or street name):  [facility], [address] [place] [address] Phone: [phone] Fax: [phone]   Agent: Please be advised that RX refills may take up to 3 business days. We ask that you follow-up with your pharmacy.

## 2018-03-04 NOTE — Telephone Encounter (Signed)
Rx is pending at office for provider review. 

## 2018-03-05 ENCOUNTER — Ambulatory Visit (HOSPITAL_COMMUNITY): Admission: RE | Admit: 2018-03-05 | Payer: Medicare Other | Source: Ambulatory Visit

## 2018-03-07 ENCOUNTER — Other Ambulatory Visit: Payer: Self-pay | Admitting: Family Medicine

## 2018-03-07 DIAGNOSIS — Z79890 Hormone replacement therapy (postmenopausal): Secondary | ICD-10-CM | POA: Diagnosis not present

## 2018-03-07 DIAGNOSIS — F419 Anxiety disorder, unspecified: Secondary | ICD-10-CM

## 2018-03-07 MED ORDER — ALPRAZOLAM 1 MG PO TABS: 1.0000 mg | ORAL_TABLET | Freq: Every evening | ORAL | 0 refills | Status: DC | PRN

## 2018-03-08 ENCOUNTER — Telehealth: Payer: Self-pay | Admitting: Family Medicine

## 2018-03-08 NOTE — Telephone Encounter (Signed)
Ebony Garcia --- i'm sending this to you

## 2018-03-08 NOTE — Telephone Encounter (Signed)
Patient told me about the alprazolam.  I was in between patients and forgot about the refill for the alprazolam.  Alprazolam was sent yesterday.   I will take responsibility for that not being sent to you.  I was not aware of toradol injection.  If you would like I will send her a message to come back in for toradol shot only.

## 2018-03-08 NOTE — Telephone Encounter (Signed)
LM for patient to return my call.

## 2018-03-08 NOTE — Telephone Encounter (Signed)
Cannot future order injections.  Nurse will order when patient arrives to get the injection.  Note made in appt notes of what mg she needs.  toradol 60mg 

## 2018-03-08 NOTE — Telephone Encounter (Signed)
Would you put order in for toradol 60 mg IM ? For tomorrow -- thanks

## 2018-03-08 NOTE — Telephone Encounter (Signed)
Spoke with patient re: Mychart message 3/29. Apologized for the delay in patients refill, she does not have all her medications and is sleeping better. Patient was appreciative of my call and thanked me for following up. She would like to go ahead with the Tordol injection, she has been scheduled on the nurse visit schedule for 03/09/18 @ 10 but patient will be in closer to 11am (put this in appointment notes)   Can you please place order for injection for her appt tomorrow?

## 2018-03-09 ENCOUNTER — Ambulatory Visit (INDEPENDENT_AMBULATORY_CARE_PROVIDER_SITE_OTHER): Payer: Medicare Other

## 2018-03-09 DIAGNOSIS — G4489 Other headache syndrome: Secondary | ICD-10-CM | POA: Diagnosis not present

## 2018-03-09 MED ORDER — KETOROLAC TROMETHAMINE 60 MG/2ML IM SOLN
60.0000 mg | Freq: Once | INTRAMUSCULAR | Status: AC
  Administered 2018-03-09: 60 mg via INTRAMUSCULAR

## 2018-03-09 NOTE — Progress Notes (Signed)
Pre visit review using our clinic review tool, if applicable. No additional management support is needed unless otherwise documented below in the visit note.  Patient was here today to receive Toradol 60 mg injection IM. Patient received injection in R upper outer quadrant, tolerated well.  Patient denied staying 10 minutes after injection to make sure there is no side effects. Patient states she received these injections multiple times before.   Encounter routed to DOD in absence of PCP.

## 2018-03-16 ENCOUNTER — Encounter: Payer: Self-pay | Admitting: Family

## 2018-03-16 ENCOUNTER — Inpatient Hospital Stay: Payer: Medicare Other

## 2018-03-16 ENCOUNTER — Other Ambulatory Visit: Payer: Self-pay

## 2018-03-16 ENCOUNTER — Inpatient Hospital Stay: Payer: Medicare Other | Attending: Family | Admitting: Family

## 2018-03-16 VITALS — BP 127/73 | HR 61 | Temp 97.8°F | Resp 18 | Wt 169.8 lb

## 2018-03-16 DIAGNOSIS — D696 Thrombocytopenia, unspecified: Secondary | ICD-10-CM

## 2018-03-16 DIAGNOSIS — D6959 Other secondary thrombocytopenia: Secondary | ICD-10-CM

## 2018-03-16 DIAGNOSIS — R0609 Other forms of dyspnea: Secondary | ICD-10-CM

## 2018-03-16 DIAGNOSIS — Z79899 Other long term (current) drug therapy: Secondary | ICD-10-CM | POA: Diagnosis not present

## 2018-03-16 DIAGNOSIS — Z733 Stress, not elsewhere classified: Secondary | ICD-10-CM | POA: Diagnosis not present

## 2018-03-16 DIAGNOSIS — G43909 Migraine, unspecified, not intractable, without status migrainosus: Secondary | ICD-10-CM

## 2018-03-16 DIAGNOSIS — R0789 Other chest pain: Secondary | ICD-10-CM | POA: Diagnosis not present

## 2018-03-16 DIAGNOSIS — T50905S Adverse effect of unspecified drugs, medicaments and biological substances, sequela: Secondary | ICD-10-CM | POA: Diagnosis not present

## 2018-03-16 LAB — CMP (CANCER CENTER ONLY)
ALT: 16 U/L (ref 0–55)
AST: 18 U/L (ref 5–34)
Albumin: 4.1 g/dL (ref 3.5–5.0)
Alkaline Phosphatase: 89 U/L (ref 40–150)
Anion gap: 8 (ref 3–11)
BUN: 14 mg/dL (ref 7–26)
CO2: 21 mmol/L — ABNORMAL LOW (ref 22–29)
Calcium: 9.4 mg/dL (ref 8.4–10.4)
Chloride: 112 mmol/L — ABNORMAL HIGH (ref 98–109)
Creatinine: 0.97 mg/dL (ref 0.60–1.10)
GFR, Est AFR Am: >60 mL/min (ref >60)
GFR, Estimated: >60 mL/min (ref >60)
Glucose, Bld: 95 mg/dL (ref 70–140)
Potassium: 3.5 mmol/L (ref 3.5–5.1)
Sodium: 141 mmol/L (ref 136–145)
Total Bilirubin: 0.4 mg/dL (ref 0.2–1.2)
Total Protein: 7.4 g/dL (ref 6.4–8.3)

## 2018-03-16 LAB — CBC WITH DIFFERENTIAL (CANCER CENTER ONLY)
Basophils Absolute: 0 10*3/uL (ref 0.0–0.1)
Basophils Relative: 1 %
Eosinophils Absolute: 0.1 10*3/uL (ref 0.0–0.5)
Eosinophils Relative: 2 %
HCT: 42.8 % (ref 34.8–46.6)
Hemoglobin: 14 g/dL (ref 11.6–15.9)
Lymphocytes Relative: 19 %
Lymphs Abs: 1.5 10*3/uL (ref 0.9–3.3)
MCH: 30.8 pg (ref 26.0–34.0)
MCHC: 32.7 g/dL (ref 32.0–36.0)
MCV: 94.1 fL (ref 81.0–101.0)
Monocytes Absolute: 0.7 10*3/uL (ref 0.1–0.9)
Monocytes Relative: 9 %
Neutro Abs: 5.3 10*3/uL (ref 1.5–6.5)
Neutrophils Relative %: 69 %
Platelet Count: 96 10*3/uL — ABNORMAL LOW (ref 145–400)
RBC: 4.55 MIL/uL (ref 3.70–5.32)
RDW: 14 % (ref 11.1–15.7)
WBC Count: 7.6 10*3/uL (ref 3.9–10.0)

## 2018-03-16 LAB — PLATELET FUNCTION ASSAY: Collagen / Epinephrine: 147 seconds (ref 0–193)

## 2018-03-16 LAB — PLATELET BY CITRATE

## 2018-03-16 NOTE — Progress Notes (Signed)
Hematology and Oncology Follow Up Visit  Ebony Garcia [id number] 30-Apr-1951 67 y.o. 03/16/2018   Principle Diagnosis:  Medication induced thrombocytopenia  Current Therapy:   Observation   Interim History:  [name] is here today for follow-up. She has a lot of stress at home and is a little tearful during our visit. I encouraged her to find a counselor to speak with for support. She admits to having had chest discomfort at times due to the stress.  She has persistent migraines and states that her neurologist has not been able to find the cause. She states that this is hereditary and her mother, brother, son and grandson also have this issue.  She has SOB with over exertion at times and will take breaks to rest as needed.   Her platelet count is 96, Hgb 14.0 and MCV 7.6. She has had no episodes of bleeding, no bruising or petechiae.  No lymphadenopathy noted on exam.  No fever, chills, n/v, cough, rash, dizziness, palpitations, abdominal pain or changes in bowel or bladder habits.  No swelling, tenderness, numbness or tingling in her extremities at this time.  Her appetite comes and goes. She is hydrating well at home. Her weight is stable.   ECOG Performance Status: 0 - Asymptomatic  Medications:  Allergies as of 03/16/2018      Reactions   Amoxicillin-pot Clavulanate Diarrhea   Antihistamines, Diphenhydramine-type Other (See Comments)   Other Reaction: makes pt nervous   Diphenhydramine Hcl Other (See Comments)   Other Reaction: makes pt nervous   Amoxicillin-pot Clavulanate Diarrhea   Clindamycin    She has not taken but per patient  "her mother had a reaction and she does not want it ever"   Hydrocodone-acetaminophen    REACTION: sensitive to  but can take for extreme pain per patient   Nortriptyline    Propoxyphene Hcl Nausea Only   Propoxyphene N-acetaminophen Nausea Only   Sulfa Antibiotics    Unknown    Tramadol Other (See Comments)   Pt does know what  kind of effect   Fremanezumab-vfrm Rash, Other (See Comments)   Seizure, jerking   Lisinopril    Restless , irregular sleep      Medication List        Accurate as of 03/16/18 11:38 AM. Always use your most recent med list.          ALPRAZolam 1 MG tablet Commonly known as:  XANAX Take 1 tablet (1 mg total) by mouth at bedtime as needed. for sleep   estradiol 0.5 MG tablet Commonly known as:  ESTRACE Take 0.5 mg by mouth daily. 1/2 tab daily   FLUoxetine 20 MG capsule Commonly known as:  PROZAC TAKE 1 CAPSULE BY MOUTH ONCE DAILY   furosemide 20 MG tablet Commonly known as:  LASIX TAKE ONE TABLET BY MOUTH ONCE DAILY   metoprolol succinate 50 MG 24 hr tablet Commonly known as:  TOPROL-XL Take 1 tablet (50 mg total) by mouth daily. Take with or immediately following a meal.   nitroGLYCERIN 0.4 MG SL tablet Commonly known as:  NITROSTAT Place 1 tablet (0.4 mg total) under the tongue every 5 (five) minutes as needed for chest pain.   promethazine 25 MG tablet Commonly known as:  PHENERGAN TAKE ONE TABLET BY MOUTH EVERY 6 HOURS AS NEEDED FOR NAUSEA AND VOMITING   rizatriptan 10 MG tablet Commonly known as:  MAXALT TAKE 1 TABLET EARLIEST ONSET OF HEADACHE. MAY REPEAT ONCE IN 2 HOURS IF  NEEDED   tiZANidine 4 MG tablet Commonly known as:  ZANAFLEX Take 1 tablet (4 mg total) by mouth every 6 (six) hours as needed for muscle spasms.   topiramate 100 MG tablet Commonly known as:  TOPAMAX TAKE 1 TABLET BY MOUTH TWICE DAILY   triamcinolone ointment 0.1 % Commonly known as:  KENALOG Apply topically as needed       Allergies:  Allergies  Allergen Reactions  . Amoxicillin-Pot Clavulanate Diarrhea  . Antihistamines, Diphenhydramine-Type Other (See Comments)    Other Reaction: makes pt nervous  . Diphenhydramine Hcl Other (See Comments)    Other Reaction: makes pt nervous  . Amoxicillin-Pot Clavulanate Diarrhea  . Clindamycin     She has not taken but per patient   "her mother had a reaction and she does not want it ever"  . Hydrocodone-Acetaminophen     REACTION: sensitive to  but can take for extreme pain per patient  . Nortriptyline   . Propoxyphene Hcl Nausea Only  . Propoxyphene N-Acetaminophen Nausea Only  . Sulfa Antibiotics     Unknown   . Tramadol Other (See Comments)    Pt does know what kind of effect  . Fremanezumab-Vfrm Rash and Other (See Comments)    Seizure, jerking  . Lisinopril     Restless , irregular sleep    Past Medical History, Surgical history, Social history, and Family History were reviewed and updated.  Review of Systems: All other 10 point review of systems is negative.   Physical Exam:  weight is 169 lb 12 oz (77 kg). Her oral temperature is 97.8 F (36.6 C). Her blood pressure is 127/73 and her pulse is 61. Her respiration is 18 and oxygen saturation is 99%.   Wt Readings from Last 3 Encounters:  03/16/18 169 lb 12 oz (77 kg)  03/03/18 172 lb 6.4 oz (78.2 kg)  11/18/17 184 lb 3.2 oz (83.6 kg)    Ocular: Sclerae unicteric, pupils equal, round and reactive to light Ear-nose-throat: Oropharynx clear, dentition fair Lymphatic: No cervical, supraclavicular or axillary adenopathy Lungs no rales or rhonchi, good excursion bilaterally Heart regular rate and rhythm, no murmur appreciated Abd soft, nontender, positive bowel sounds, no liver or spleen tip palpated on exam, no fluid wave  MSK no focal spinal tenderness, no joint edema Neuro: non-focal, well-oriented, appropriate affect Breasts: Deferred   Lab Results  Component Value Date   WBC 7.6 03/16/2018   HGB 14.4 11/11/2017   HCT 42.8 03/16/2018   MCV 94.1 03/16/2018   PLT 96 (L) 03/16/2018   No results found for: FERRITIN, IRON, TIBC, UIBC, IRONPCTSAT Lab Results  Component Value Date   RBC 4.55 03/16/2018   No results found for: KPAFRELGTCHN, LAMBDASER, KAPLAMBRATIO No results found for: IGGSERUM, IGA, IGMSERUM No results found for:  Odetta Pink, SPEI   Chemistry      Component Value Date/Time   NA 143 11/11/2017 1339   K 3.6 11/11/2017 1339   CL 109 (H) 11/11/2017 1339   CO2 24 11/11/2017 1339   BUN 9 11/11/2017 1339   CREATININE 1.0 11/11/2017 1339      Component Value Date/Time   CALCIUM 9.3 11/11/2017 1339   ALKPHOS 85 (H) 11/11/2017 1339   AST 21 11/11/2017 1339   ALT 24 11/11/2017 1339   BILITOT 0.60 11/11/2017 1339     Impression and Plan: [name] is a pleasant 67 yo caucasian female with medication induced thrombocytopenia. Platelet count is stable  at 96, no anemia and WBC count is 7.6.  We will continue to follow along with her and plan to see her back in another 6 months.  She will contact our office with any questions or concerns. We can certainly see her sooner if need be.   Laverna Peace, NP 4/10/201911:38 AM

## 2018-03-17 ENCOUNTER — Encounter: Payer: Self-pay | Admitting: Family

## 2018-03-29 ENCOUNTER — Other Ambulatory Visit: Payer: Self-pay

## 2018-03-29 ENCOUNTER — Other Ambulatory Visit: Payer: Self-pay | Admitting: Neurology

## 2018-03-29 ENCOUNTER — Encounter: Payer: Self-pay | Admitting: Neurology

## 2018-03-29 MED ORDER — TOPIRAMATE 100 MG PO TABS: 100.0000 mg | ORAL_TABLET | Freq: Two times a day (BID) | ORAL | 1 refills | Status: DC

## 2018-04-03 ENCOUNTER — Other Ambulatory Visit: Payer: Self-pay | Admitting: Family Medicine

## 2018-04-03 DIAGNOSIS — F419 Anxiety disorder, unspecified: Secondary | ICD-10-CM

## 2018-04-05 ENCOUNTER — Encounter: Payer: Self-pay | Admitting: Family Medicine

## 2018-04-05 NOTE — Telephone Encounter (Signed)
[facility] requesting refill for alprazolam  Database ran and is on your desk for review.  Last filled per database: 03/07/18 Last written: 03/07/18 Last ov: 03/03/18 Next ov: none Contract: 11/24/18 UDS: 11/24/18

## 2018-04-06 ENCOUNTER — Ambulatory Visit: Payer: Self-pay | Admitting: *Deleted

## 2018-04-06 ENCOUNTER — Telehealth: Payer: Self-pay | Admitting: Family Medicine

## 2018-04-06 NOTE — Telephone Encounter (Signed)
Pt's mom called that the patient is having a severe migraine that started this morning. She states this migraine is one of the worst that she has had, esp on the left side of her head. It is constant and has a stiff neck and sore eyes. Nothing has helped her head and now she is requesting an injection of Toradol. That usually helps with the pain. She normally gets it at the office of her pcp. Advised to go to emergency department per protocol. Pt voiced understanding. Will notify flow at [facility] at [facility] point.  Reason for Disposition . [1] SEVERE headache (e.g., excruciating) AND [2] "worst headache" of life  Answer Assessment - Initial Assessment Questions 1. LOCATION: "Where does it hurt?"      Left side 2. ONSET: "When did the headache start?" (Minutes, hours or days)      This morning 3. PATTERN: "Does the pain come and go, or has it been constant since it started?"     constant 4. SEVERITY: "How bad is the pain?" and "What does it keep you from doing?"  (e.g., Scale 1-10; mild, moderate, or severe)   - MILD (1-3): doesn't interfere with normal activities    - MODERATE (4-7): interferes with normal activities or awakens from sleep    - SEVERE (8-10): excruciating pain, unable to do any normal activities        Pain # 10 5. RECURRENT SYMPTOM: "Have you ever had headaches before?" If so, ask: "When was the last time?" and "What happened that time?"      Yes  6. CAUSE: "What do you think is causing the headache?"     migraine 7. MIGRAINE: "Have you been diagnosed with migraine headaches?" If so, ask: "Is this headache similar?"      yes 8. HEAD INJURY: "Has there been any recent injury to the head?"      no 9. OTHER SYMPTOMS: "Do you have any other symptoms?" (fever, stiff neck, eye pain, sore throat, cold symptoms)     Stiff neck, sore eyes 10. PREGNANCY: "Is there any chance you are pregnant?" "When was your last menstrual period?"       no  Protocols used:  HEADACHE-A-AH

## 2018-04-06 NOTE — Telephone Encounter (Signed)
FYI to PCP

## 2018-04-06 NOTE — Telephone Encounter (Signed)
Copied from [place]. Topic: Quick Communication - See Telephone Encounter >> Apr 06, 2018  2:44 PM Rutherford Nail, NT wrote: CRM for notification. See Telephone encounter for: 04/06/18. Patient calling to schedule toradol injection for a migrane.Unsure if it needed to be a nurse visit or approved by dr Etter Sjogren. Please advise. CB#: [phone]

## 2018-04-06 NOTE — Telephone Encounter (Signed)
Just now seeing this question. HA usually needs a neuro exam before treatment particularly in her age group even though she has history of HA. If pcp feels ok authorizing nurse visit then ok. But I would defer to pcp. I don't know her history?

## 2018-04-06 NOTE — Telephone Encounter (Signed)
Edward -- please advise in PCP's absence? 

## 2018-04-08 ENCOUNTER — Ambulatory Visit: Payer: Medicare Other | Admitting: Medical

## 2018-04-08 NOTE — Telephone Encounter (Signed)
Notified pt. States she has had a headache for the last 3 days and is willing to come in for OV to get medication. Appointment scheduled for today with Percell Miller at 11:15am.

## 2018-04-08 NOTE — Telephone Encounter (Signed)
Attempted to reach pt and left message for her to return my call.

## 2018-04-19 ENCOUNTER — Encounter: Payer: Self-pay | Admitting: Neurology

## 2018-04-19 ENCOUNTER — Ambulatory Visit (INDEPENDENT_AMBULATORY_CARE_PROVIDER_SITE_OTHER): Payer: Medicare Other | Admitting: Neurology

## 2018-04-19 VITALS — BP 126/82 | HR 49 | Ht 62.0 in | Wt 168.0 lb

## 2018-04-19 DIAGNOSIS — G43709 Chronic migraine without aura, not intractable, without status migrainosus: Secondary | ICD-10-CM

## 2018-04-19 NOTE — Progress Notes (Signed)
NEUROLOGY FOLLOW UP OFFICE NOTE  Ebony Garcia [id number]  HISTORY OF PRESENT ILLNESS: Ebony Garcia is a 67 year old right-handed woman with hypertension, CHF, renal calculi, depression, and hyperlipidemia and who follows up for migraine headache.    UPDATE:  She tried nortriptyline which was helpful but caused many side effects such as rash on thighs.  After reading about the anti-CGRPs, she defers trying a different one.  Headaches were daily to every other day until 2 weeks ago.  She has been doing well since then.  Migraines are severe.  She is unable to afford Maxalt.  She doesn't take any abortive therapy, so they last all day. Current NSAIDS:  no Current analgesics:  no. Current triptans:  no Current anti-emetic:  Phenergan Current muscle relaxants:  no Current anti-anxiolytic:  Xanax 1mg  Current sleep aide:  Xanax 1mg  Current Antihypertensive medications:  Toprol XL 50mg , Lasix Current Antidepressant medications:  Prozac 20mg  Current Anticonvulsant medications:  topiramate 100mg  BID, gabapentin 300mg  at night  Depression and anxiety:  Yes    HISTORY: Onset:  Since late 30s Location: usually left sided, periorbital, and back of head.  More recently, holocephalic Quality:  Sharp, aching pressure.  Non-throbbing Initial intensity:  7/10; Sept: 7/10 Associated symptoms:  Nausea, photophobia and phonophobia.  No visual disturbance, osmophobia or vomiting Aura:  no Initial duration:  3 days; Sept: 30 minutes with Maxalt Initial frequency:  25 headache days per month; Sept: 25 headache days per month Triggers/exacerbating factors:  stress Relieving factors:  Cold mask, heating pack on neck and shoulders   Past NSAIDs:  naproxen (ineffective), indomethacin, toradol tablet (ineffective), Toradol 60mg  IM (effective), Cambia (side effects) Past analgesics:  Tylenol (ineffective), Excedrin (ineffective), Lidocaine nasal drops (effective but caused hives), tramadol (side  effects), Midrin (effective but made her sleepy) Past triptans/ergots:  sumatriptan 100mg , Zomig 5mg  (effective), Frova, Relpax, DHE (effective)   Past antiemetic:  Reglan Past anxiolytics:  Vicodin Past muscle relaxants:  cyclobenzaprine Past antihypertensives:  propranolol (ineffective), atenolol (ineffective), lisinopril, losartan, HCTZ Past antidepressants:  amitriptyline (side effects), venlafaxine (ineffective), nortriptyline (effective but side effects such as rash) Past antiepileptics:  Depakote (hair loss), lamotrigine (side effects), zonisamide (side effects) Past CGRP inhibitor:  Aimovig (effective but concerned about side effects- ankle jerking, abnormal skin pattern). Past vitamins and supplements:  magnesium (ineffective), feverfew (ineffective) Other past therapy:  Botox (one round, flu like symptoms, unable to afford 20% copay), occipital nerve blocks, trigger point injections, alternating heat/ice   Family history of headache:  Mother, grandmother, great-aunt, cousins.     08/16/08 MRI Brain w/wo performed for "explosive headaches": nonspecific punctate hyperintensities in the subcortical white matter. 08/16/08 MRA Head: Unremarkable.  There is mild attenuation in the vertebrobasilar junction and proximal basilar artery which is likely artifact.  PAST MEDICAL HISTORY: Past Medical History:  Diagnosis Date  . Cancer ([place])   . CHF (congestive heart failure) ([place])    dystolic heart dysfunction  . Chronic kidney disease   . Depression   . Depression with anxiety   . Diabetes mellitus without complication ([place])    Diet controlled  . Hypertension   . Intertrigo   . Migraines   . Panic attacks     MEDICATIONS: Current Outpatient Medications on File Prior to Visit  Medication Sig Dispense Refill  . ALPRAZolam (XANAX) 1 MG tablet TAKE 1 TABLET BY MOUTH AT BEDTIME AS NEEDED FOR SLEEP 30 tablet 0  . estradiol (ESTRACE) 0.5 MG tablet Take 0.5 mg by mouth  daily. 1/2 tab daily     . FLUoxetine (PROZAC) 20 MG capsule TAKE 1 CAPSULE BY MOUTH ONCE DAILY 30 capsule 6  . furosemide (LASIX) 20 MG tablet TAKE ONE TABLET BY MOUTH ONCE DAILY 90 tablet 3  . metoprolol succinate (TOPROL-XL) 50 MG 24 hr tablet Take 1 tablet (50 mg total) by mouth daily. Take with or immediately following a meal. 90 tablet 2  . nitroGLYCERIN (NITROSTAT) 0.4 MG SL tablet Place 1 tablet (0.4 mg total) under the tongue every 5 (five) minutes as needed for chest pain. 25 tablet 3  . promethazine (PHENERGAN) 25 MG tablet TAKE ONE TABLET BY MOUTH EVERY 6 HOURS AS NEEDED FOR NAUSEA AND VOMITING 30 tablet 2  . rizatriptan (MAXALT) 10 MG tablet TAKE 1 TABLET EARLIEST ONSET OF HEADACHE. MAY REPEAT ONCE IN 2 HOURS IF NEEDED 10 tablet 1  . tiZANidine (ZANAFLEX) 4 MG tablet Take 1 tablet (4 mg total) by mouth every 6 (six) hours as needed for muscle spasms. (Patient not taking: Reported on 03/16/2018) 30 tablet 1  . topiramate (TOPAMAX) 100 MG tablet TAKE 1 TABLET BY MOUTH TWICE DAILY 60 tablet 5  . topiramate (TOPAMAX) 100 MG tablet Take 1 tablet (100 mg total) by mouth 2 (two) times daily. 180 tablet 1  . triamcinolone ointment (KENALOG) 0.1 % Apply topically as needed     No current facility-administered medications on file prior to visit.     ALLERGIES: Allergies  Allergen Reactions  . Amoxicillin-Pot Clavulanate Diarrhea  . Antihistamines, Diphenhydramine-Type Other (See Comments)    Other Reaction: makes pt nervous  . Diphenhydramine Hcl Other (See Comments)    Other Reaction: makes pt nervous  . Amoxicillin-Pot Clavulanate Diarrhea  . Clindamycin     She has not taken but per patient  "her mother had a reaction and she does not want it ever"  . Hydrocodone-Acetaminophen     REACTION: sensitive to  but can take for extreme pain per patient  . Nortriptyline   . Propoxyphene Hcl Nausea Only  . Propoxyphene N-Acetaminophen Nausea Only  . Sulfa Antibiotics     Unknown   . Tramadol Other (See  Comments)    Pt does know what kind of effect  . Fremanezumab-Vfrm Rash and Other (See Comments)    Seizure, jerking  . Lisinopril     Restless , irregular sleep    FAMILY HISTORY: Family History  Problem Relation Age of Onset  . Crohn's disease Mother   . Atrial fibrillation Mother   . Heart failure Mother   . Bladder Cancer Father   . Cancer Father        bladder  . Bipolar disorder Brother   . Cancer Brother   . Bipolar disorder Sister   . Heart attack Maternal Grandmother        >65  . Cancer Maternal Aunt   . Colon cancer Maternal Uncle   . Cancer Paternal Grandfather   . Cancer Maternal Aunt   . Cancer Cousin   . Anemia Cousin   . Diabetes Neg Hx   . Stroke Neg Hx     SOCIAL HISTORY: Social History   Socioeconomic History  . Marital status: Divorced    Spouse name: Not on file  . Number of children: 3  . Years of education: Not on file  . Highest education level: Not on file  Occupational History    Employer: UNEMPLOYED  Social Needs  . Financial resource strain: Not on file  .  Food insecurity:    Worry: Not on file    Inability: Not on file  . Transportation needs:    Medical: Not on file    Non-medical: Not on file  Tobacco Use  . Smoking status: Never Smoker  . Smokeless tobacco: Never Used  Substance and Sexual Activity  . Alcohol use: No    Alcohol/week: 0.0 oz  . Drug use: No  . Sexual activity: Yes    Partners: Male  Lifestyle  . Physical activity:    Days per week: Not on file    Minutes per session: Not on file  . Stress: Not on file  Relationships  . Social connections:    Talks on phone: Not on file    Gets together: Not on file    Attends religious service: Not on file    Active member of club or organization: Not on file    Attends meetings of clubs or organizations: Not on file    Relationship status: Not on file  . Intimate partner violence:    Fear of current or ex partner: Not on file    Emotionally abused: Not on file      Physically abused: Not on file    Forced sexual activity: Not on file  Other Topics Concern  . Not on file  Social History Narrative   Lives alone.            REVIEW OF SYSTEMS: Constitutional: No fevers, chills, or sweats, no generalized fatigue, change in appetite Eyes: No visual changes, double vision, eye pain Ear, nose and throat: No hearing loss, ear pain, nasal congestion, sore throat Cardiovascular: No chest pain, palpitations Respiratory:  No shortness of breath at rest or with exertion, wheezes GastrointestinaI: No nausea, vomiting, diarrhea, abdominal pain, fecal incontinence Genitourinary:  No dysuria, urinary retention or frequency Musculoskeletal:  No neck pain, back pain Integumentary: No rash, pruritus, skin lesions Neurological: as above Psychiatric: No depression, insomnia, anxiety Endocrine: No palpitations, fatigue, diaphoresis, mood swings, change in appetite, change in weight, increased thirst Hematologic/Lymphatic:  No purpura, petechiae. Allergic/Immunologic: no itchy/runny eyes, nasal congestion, recent allergic reactions, rashes  PHYSICAL EXAM: Vitals:   04/19/18 1441  BP: 126/82  Pulse: (!) 49  SpO2: 98%   General: No acute distress.  Patient appears well-groomed.  . Head:  Normocephalic/atraumatic Eyes:  Fundi examined but not visualized Neck: supple, no paraspinal tenderness, full range of motion Heart:  Regular rate and rhythm Lungs:  Clear to auscultation bilaterally Back: No paraspinal tenderness Neurological Exam: alert and oriented to person, place, and time. Attention span and concentration intact, recent and remote memory intact, fund of knowledge intact.  Speech fluent and not dysarthric, language intact.  CN II-XII intact. Bulk and tone normal, muscle strength 5/5 throughout.  Sensation to light touch  intact.  Deep tendon reflexes 2+ throughout.  Finger to nose testing intact.  Gait normal, Romberg negative.  IMPRESSION: Chronic  migraine without aura  PLAN: 1.  Continue topiramate 2.  Until she is able to afford Maxalt again, I will have her try Zomig NS sample 3.  Follow up in 6 months.   Metta Clines, DO  CC: Dr. Carollee Herter

## 2018-04-19 NOTE — Patient Instructions (Signed)
1.  Continue topiramate 2.  While you are unable to afford Maxalt, try the Zomig nasal spray.  1 spray in one nostril.  May repeat once after 2 hours if needed. 3.  Follow up in 6 months.

## 2018-05-03 ENCOUNTER — Other Ambulatory Visit: Payer: Self-pay | Admitting: Family Medicine

## 2018-05-03 DIAGNOSIS — F419 Anxiety disorder, unspecified: Secondary | ICD-10-CM

## 2018-05-03 NOTE — Telephone Encounter (Signed)
Ebony Garcia 1MG  Contract: 11/24/17 UDS: 11/24/17 Low risk Last OV: 03/03/18 Next OV: Last Refill:04/06/18   Please advise

## 2018-05-11 NOTE — Progress Notes (Addendum)
Subjective:   Ebony Garcia is a 67 y.o. female who presents for Medicare Annual (Subsequent) preventive examination.  Works as caregiver 3hrs/ 6 nights per week.  Review of Systems: No ROS.  Medicare Wellness Visit. Additional risk factors are reflected in the social history.  Sleep patterns:Sleeps a lot. Home Safety/Smoke Alarms: Feels safe in home. Smoke alarms in place.  Living environment; residence and Firearm Safety: Lives alone in apt. 1st floor.    Female:   Pap- hysterectomy      Mammo- Dr.Fogelman. 2018 per pt. Normal           CCS-past due since 2016     Objective:     Vitals: BP 130/70 (BP Location: Left Arm, Patient Position: Sitting, Cuff Size: Normal)   Pulse (!) 55   Ht 5\' 2"  (1.575 m)   Wt 167 lb 3.2 oz (75.8 kg)   SpO2 98%   BMI 30.58 kg/m   Body mass index is 30.58 kg/m.  Advanced Directives 05/17/2018 03/16/2018 05/14/2017 04/30/2013  Does Patient Have a Medical Advance Directive? Yes Yes Yes Patient has advance directive, copy not in chart  Type of Advance Directive Living will Living will Living will Living will  Does patient want to make changes to medical advance directive? - No - Patient declined - -  Copy of [place] in Chart? - - - Copy requested from family  Would patient like information on creating a medical advance directive? - No - Patient declined - -    Tobacco Social History   Tobacco Use  Smoking Status Never Smoker  Smokeless Tobacco Never Used     Counseling given: Not Answered   Clinical Intake: Pain : No/denies pain     Past Medical History:  Diagnosis Date  . Cancer ([place])   . CHF (congestive heart failure) ([place])    dystolic heart dysfunction  . Chronic kidney disease   . Depression   . Depression with anxiety   . Diabetes mellitus without complication ([place])    Diet controlled  . Hypertension   . Intertrigo   . Migraines   . Panic attacks    Past Surgical History:  Procedure Laterality  Date  . ABDOMINAL HYSTERECTOMY    . BREAST SURGERY    . DILATION AND CURETTAGE OF UTERUS    . NASAL SEPTUM SURGERY    . papaloma Right   . TONSILLECTOMY AND ADENOIDECTOMY    . TOTAL ABDOMINAL HYSTERECTOMY W/ BILATERAL SALPINGOOPHORECTOMY     for fibroids and migraines  . TUBAL LIGATION     Family History  Problem Relation Age of Onset  . Crohn's disease Mother   . Atrial fibrillation Mother   . Heart failure Mother   . Bladder Cancer Father   . Cancer Father        bladder  . Bipolar disorder Brother   . Cancer Brother   . Bipolar disorder Sister   . Heart attack Maternal Grandmother        >65  . Cancer Maternal Aunt   . Colon cancer Maternal Uncle   . Cancer Paternal Grandfather   . Cancer Maternal Aunt   . Cancer Cousin   . Anemia Cousin   . Diabetes Neg Hx   . Stroke Neg Hx    Social History   Socioeconomic History  . Marital status: Divorced    Spouse name: Not on file  . Number of children: 3  . Years of education: Not on  file  . Highest education level: Not on file  Occupational History    Employer: UNEMPLOYED  Social Needs  . Financial resource strain: Not on file  . Food insecurity:    Worry: Not on file    Inability: Not on file  . Transportation needs:    Medical: Not on file    Non-medical: Not on file  Tobacco Use  . Smoking status: Never Smoker  . Smokeless tobacco: Never Used  Substance and Sexual Activity  . Alcohol use: No    Alcohol/week: 0.0 oz  . Drug use: No  . Sexual activity: Not Currently    Partners: Male  Lifestyle  . Physical activity:    Days per week: Not on file    Minutes per session: Not on file  . Stress: Not on file  Relationships  . Social connections:    Talks on phone: Not on file    Gets together: Not on file    Attends religious service: Not on file    Active member of club or organization: Not on file    Attends meetings of clubs or organizations: Not on file    Relationship status: Not on file  Other  Topics Concern  . Not on file  Social History Narrative   Lives alone.            Outpatient Encounter Medications as of 05/17/2018  Medication Sig  . ALPRAZolam (XANAX) 1 MG tablet TAKE 1 TABLET BY MOUTH AT BEDTIME AS NEEDED FOR SLEEP  . estradiol (ESTRACE) 0.5 MG tablet Take 0.5 mg by mouth daily. 1/2 tab daily  . FLUoxetine (PROZAC) 20 MG capsule TAKE 1 CAPSULE BY MOUTH ONCE DAILY  . furosemide (LASIX) 20 MG tablet TAKE ONE TABLET BY MOUTH ONCE DAILY  . metoprolol succinate (TOPROL-XL) 50 MG 24 hr tablet Take 1 tablet (50 mg total) by mouth daily. Take with or immediately following a meal.  . rizatriptan (MAXALT) 10 MG tablet TAKE 1 TABLET EARLIEST ONSET OF HEADACHE. MAY REPEAT ONCE IN 2 HOURS IF NEEDED  . topiramate (TOPAMAX) 100 MG tablet TAKE 1 TABLET BY MOUTH TWICE DAILY  . triamcinolone ointment (KENALOG) 0.1 % Apply topically as needed  . nitroGLYCERIN (NITROSTAT) 0.4 MG SL tablet Place 1 tablet (0.4 mg total) under the tongue every 5 (five) minutes as needed for chest pain. (Patient not taking: Reported on 05/17/2018)  . promethazine (PHENERGAN) 25 MG tablet TAKE ONE TABLET BY MOUTH EVERY 6 HOURS AS NEEDED FOR NAUSEA AND VOMITING (Patient not taking: Reported on 05/17/2018)  . [DISCONTINUED] tiZANidine (ZANAFLEX) 4 MG tablet Take 1 tablet (4 mg total) by mouth every 6 (six) hours as needed for muscle spasms. (Patient not taking: Reported on 03/16/2018)  . [DISCONTINUED] topiramate (TOPAMAX) 100 MG tablet Take 1 tablet (100 mg total) by mouth 2 (two) times daily.   No facility-administered encounter medications on file as of 05/17/2018.     Activities of Daily Living In your present state of health, do you have any difficulty performing the following activities: 05/17/2018  Hearing? N  Vision? N  Difficulty concentrating or making decisions? N  Walking or climbing stairs? N  Dressing or bathing? N  Doing errands, shopping? N  Preparing Food and eating ? N  Using the Toilet? N    In the past six months, have you accidently leaked urine? N  Do you have problems with loss of bowel control? Y  Managing your Medications? N  Managing your Finances? N  Housekeeping or managing your Housekeeping? N  Some recent data might be hidden    Patient Care Team: Carollee Herter, Alferd Apa, DO as PCP - General (Family Medicine) Janann August, MD as Referring Physician (Dermatology) Aloha Gell, MD as Consulting Physician (Obstetrics and Gynecology) Pieter Partridge, DO as Consulting Physician (Neurology) [place], Jennefer Bravo, MD as Consulting Physician (Dermatology) Minus Breeding, MD as Consulting Physician (Cardiology) Karie Georges, OD as Consulting Physician (Optometry) Bunnie Domino, LPN as Licensed Practical Nurse    Assessment:   This is a routine wellness examination for [place]. Physical assessment deferred to PCP.  Exercise Activities and Dietary recommendations Current Exercise Habits: The patient does not participate in regular exercise at present, Exercise limited by: None identified Diet (meal preparation, eat out, water intake, caffeinated beverages, dairy products, fruits and vegetables): well balanced    Goals    . Increase physical activity     Walk for 30 min 3x/week.       Fall Risk Fall Risk  05/17/2018 04/19/2018 11/18/2017 08/16/2017 04/09/2017  Falls in the past year? No Yes No No No  Number falls in past yr: - 1 - - -  Injury with Fall? - No - - -    Depression Screen PHQ 2/9 Scores 05/17/2018 09/20/2017 05/14/2017 11/03/2016  PHQ - 2 Score 2 2 6 6   PHQ- 9 Score 8 8 13 16      Cognitive Function MMSE - Mini Mental State Exam 05/14/2017  Orientation to time 5  Orientation to Place 5  Registration 3  Attention/ Calculation 5  Recall 3  Language- name 2 objects 2  Language- repeat 1  Language- follow 3 step command 3  Language- read & follow direction 1  Write a sentence 1  Copy design 1  Total score 30        Immunization History   Administered Date(s) Administered  . Hepatitis B, adult 07/30/2016, 09/29/2016  . Influenza Split 09/19/2012  . Influenza Whole 11/08/2009  . Influenza,inj,Quad PF,6+ Mos 11/07/2013, 07/30/2016, 09/20/2017  . Influenza-Unspecified 09/11/2015  . PPD Test 07/30/2016, 11/03/2017  . Td 11/08/2009    Screening Tests Health Maintenance  Topic Date Due  . COLONOSCOPY  12/12/2014  . MAMMOGRAM  09/10/2017  . INFLUENZA VACCINE  07/07/2018  . TETANUS/TDAP  11/09/2019  . Hepatitis C Screening  Completed  . HIV Screening  Completed      Plan:    Please schedule your next medicare wellness visit with me in 1 yr.  Continue to eat heart healthy diet (full of fruits, vegetables, whole grains, lean protein, water--limit salt, fat, and sugar intake) and increase physical activity as tolerated.  Continue doing brain stimulating activities (puzzles, reading, adult coloring books, staying active) to keep memory sharp.     I have personally reviewed and noted the following in the patient's chart:   . Medical and social history . Use of alcohol, tobacco or illicit drugs  . Current medications and supplements . Functional ability and status . Nutritional status . Physical activity . Advanced directives . List of other physicians . Hospitalizations, surgeries, and ER visits in previous 12 months . Vitals . Screenings to include cognitive, depression, and falls . Referrals and appointments  In addition, I have reviewed and discussed with patient certain preventive protocols, quality metrics, and best practice recommendations. A written personalized care plan for preventive services as well as general preventive health recommendations were provided to patient.     Shela Nevin, [place]  05/17/2018  Reviewed. [name], DO

## 2018-05-17 ENCOUNTER — Ambulatory Visit: Payer: Medicare Other | Admitting: *Deleted

## 2018-05-17 ENCOUNTER — Encounter: Payer: Self-pay | Admitting: *Deleted

## 2018-05-17 ENCOUNTER — Ambulatory Visit (INDEPENDENT_AMBULATORY_CARE_PROVIDER_SITE_OTHER): Payer: Medicare Other | Admitting: *Deleted

## 2018-05-17 VITALS — BP 130/70 | HR 55 | Ht 62.0 in | Wt 167.2 lb

## 2018-05-17 DIAGNOSIS — Z0000 Encounter for general adult medical examination without abnormal findings: Secondary | ICD-10-CM

## 2018-05-17 NOTE — Patient Instructions (Addendum)
Please schedule your next medicare wellness visit with me in 1 yr.  Continue to eat heart healthy diet (full of fruits, vegetables, whole grains, lean protein, water--limit salt, fat, and sugar intake) and increase physical activity as tolerated.  Continue doing brain stimulating activities (puzzles, reading, adult coloring books, staying active) to keep memory sharp.    Ebony Garcia , Thank you for taking time to come for your Medicare Wellness Visit. I appreciate your ongoing commitment to your health goals. Please review the following plan we discussed and let me know if I can assist you in the future.   These are the goals we discussed: Goals    . Increase physical activity     Walk for 30 min 3x/week.       This is a list of the screening recommended for you and due dates:  Health Maintenance  Topic Date Due  . Colon Cancer Screening  12/12/2014  . Mammogram  09/10/2017  . Flu Shot  07/07/2018  . Tetanus Vaccine  11/09/2019  .  Hepatitis C: One time screening is recommended by Center for Disease Control  (CDC) for  adults born from [birth year] through 1963.   Completed  . HIV Screening  Completed    Health Maintenance for Postmenopausal Women Menopause is a normal process in which your reproductive ability comes to an end. This process happens gradually over a span of months to years, usually between the ages of 75 and 107. Menopause is complete when you have missed 12 consecutive menstrual periods. It is important to talk with your health care provider about some of the most common conditions that affect postmenopausal women, such as heart disease, cancer, and bone loss (osteoporosis). Adopting a healthy lifestyle and getting preventive care can help to promote your health and wellness. Those actions can also lower your chances of developing some of these common conditions. What should I know about menopause? During menopause, you may experience a number of symptoms, such  as:  Moderate-to-severe hot flashes.  Night sweats.  Decrease in sex drive.  Mood swings.  Headaches.  Tiredness.  Irritability.  Memory problems.  Insomnia.  Choosing to treat or not to treat menopausal changes is an individual decision that you make with your health care provider. What should I know about hormone replacement therapy and supplements? Hormone therapy products are effective for treating symptoms that are associated with menopause, such as hot flashes and night sweats. Hormone replacement carries certain risks, especially as you become older. If you are thinking about using estrogen or estrogen with progestin treatments, discuss the benefits and risks with your health care provider. What should I know about heart disease and stroke? Heart disease, heart attack, and stroke become more likely as you age. This may be due, in part, to the hormonal changes that your body experiences during menopause. These can affect how your body processes dietary fats, triglycerides, and cholesterol. Heart attack and stroke are both medical emergencies. There are many things that you can do to help prevent heart disease and stroke:  Have your blood pressure checked at least every 1-2 years. High blood pressure causes heart disease and increases the risk of stroke.  If you are 50-75 years old, ask your health care provider if you should take aspirin to prevent a heart attack or a stroke.  Do not use any tobacco products, including cigarettes, chewing tobacco, or electronic cigarettes. If you need help quitting, ask your health care provider.  It is important to  eat a healthy diet and maintain a healthy weight. ? Be sure to include plenty of vegetables, fruits, low-fat dairy products, and lean protein. ? Avoid eating foods that are high in solid fats, added sugars, or salt (sodium).  Get regular exercise. This is one of the most important things that you can do for your health. ? Try  to exercise for at least 150 minutes each week. The type of exercise that you do should increase your heart rate and make you sweat. This is known as moderate-intensity exercise. ? Try to do strengthening exercises at least twice each week. Do these in addition to the moderate-intensity exercise.  Know your numbers.Ask your health care provider to check your cholesterol and your blood glucose. Continue to have your blood tested as directed by your health care provider.  What should I know about cancer screening? There are several types of cancer. Take the following steps to reduce your risk and to catch any cancer development as early as possible. Breast Cancer  Practice breast self-awareness. ? This means understanding how your breasts normally appear and feel. ? It also means doing regular breast self-exams. Let your health care provider know about any changes, no matter how small.  If you are 72 or older, have a clinician do a breast exam (clinical breast exam or CBE) every year. Depending on your age, family history, and medical history, it may be recommended that you also have a yearly breast X-ray (mammogram).  If you have a family history of breast cancer, talk with your health care provider about genetic screening.  If you are at high risk for breast cancer, talk with your health care provider about having an MRI and a mammogram every year.  Breast cancer (BRCA) gene test is recommended for women who have family members with BRCA-related cancers. Results of the assessment will determine the need for genetic counseling and BRCA1 and for BRCA2 testing. BRCA-related cancers include these types: ? Breast. This occurs in males or females. ? Ovarian. ? Tubal. This may also be called fallopian tube cancer. ? Cancer of the abdominal or pelvic lining (peritoneal cancer). ? Prostate. ? Pancreatic.  Cervical, Uterine, and Ovarian Cancer Your health care provider may recommend that you be  screened regularly for cancer of the pelvic organs. These include your ovaries, uterus, and vagina. This screening involves a pelvic exam, which includes checking for microscopic changes to the surface of your cervix (Pap test).  For women ages 21-65, health care providers may recommend a pelvic exam and a Pap test every three years. For women ages 35-65, they may recommend the Pap test and pelvic exam, combined with testing for human papilloma virus (HPV), every five years. Some types of HPV increase your risk of cervical cancer. Testing for HPV may also be done on women of any age who have unclear Pap test results.  Other health care providers may not recommend any screening for nonpregnant women who are considered low risk for pelvic cancer and have no symptoms. Ask your health care provider if a screening pelvic exam is right for you.  If you have had past treatment for cervical cancer or a condition that could lead to cancer, you need Pap tests and screening for cancer for at least 20 years after your treatment. If Pap tests have been discontinued for you, your risk factors (such as having a new sexual partner) need to be reassessed to determine if you should start having screenings again. Some women have  medical problems that increase the chance of getting cervical cancer. In these cases, your health care provider may recommend that you have screening and Pap tests more often.  If you have a family history of uterine cancer or ovarian cancer, talk with your health care provider about genetic screening.  If you have vaginal bleeding after reaching menopause, tell your health care provider.  There are currently no reliable tests available to screen for ovarian cancer.  Lung Cancer Lung cancer screening is recommended for adults 27-24 years old who are at high risk for lung cancer because of a history of smoking. A yearly low-dose CT scan of the lungs is recommended if you:  Currently  smoke.  Have a history of at least 30 pack-years of smoking and you currently smoke or have quit within the past 15 years. A pack-year is smoking an average of one pack of cigarettes per day for one year.  Yearly screening should:  Continue until it has been 15 years since you quit.  Stop if you develop a health problem that would prevent you from having lung cancer treatment.  Colorectal Cancer  This type of cancer can be detected and can often be prevented.  Routine colorectal cancer screening usually begins at age 105 and continues through age 110.  If you have risk factors for colon cancer, your health care provider may recommend that you be screened at an earlier age.  If you have a family history of colorectal cancer, talk with your health care provider about genetic screening.  Your health care provider may also recommend using home test kits to check for hidden blood in your stool.  A small camera at the end of a tube can be used to examine your colon directly (sigmoidoscopy or colonoscopy). This is done to check for the earliest forms of colorectal cancer.  Direct examination of the colon should be repeated every 5-10 years until age 96. However, if early forms of precancerous polyps or small growths are found or if you have a family history or genetic risk for colorectal cancer, you may need to be screened more often.  Skin Cancer  Check your skin from head to toe regularly.  Monitor any moles. Be sure to tell your health care provider: ? About any new moles or changes in moles, especially if there is a change in a mole's shape or color. ? If you have a mole that is larger than the size of a pencil eraser.  If any of your family members has a history of skin cancer, especially at a young age, talk with your health care provider about genetic screening.  Always use sunscreen. Apply sunscreen liberally and repeatedly throughout the day.  Whenever you are outside, protect  yourself by wearing long sleeves, pants, a wide-brimmed hat, and sunglasses.  What should I know about osteoporosis? Osteoporosis is a condition in which bone destruction happens more quickly than new bone creation. After menopause, you may be at an increased risk for osteoporosis. To help prevent osteoporosis or the bone fractures that can happen because of osteoporosis, the following is recommended:  If you are 28-75 years old, get at least 1,000 mg of calcium and at least 600 mg of vitamin D per day.  If you are older than age 68 but younger than age 29, get at least 1,200 mg of calcium and at least 600 mg of vitamin D per day.  If you are older than age 99, get at least 26,200  mg of calcium and at least 800 mg of vitamin D per day.  Smoking and excessive alcohol intake increase the risk of osteoporosis. Eat foods that are rich in calcium and vitamin D, and do weight-bearing exercises several times each week as directed by your health care provider. What should I know about how menopause affects my mental health? Depression may occur at any age, but it is more common as you become older. Common symptoms of depression include:  Low or sad mood.  Changes in sleep patterns.  Changes in appetite or eating patterns.  Feeling an overall lack of motivation or enjoyment of activities that you previously enjoyed.  Frequent crying spells.  Talk with your health care provider if you think that you are experiencing depression. What should I know about immunizations? It is important that you get and maintain your immunizations. These include:  Tetanus, diphtheria, and pertussis (Tdap) booster vaccine.  Influenza every year before the flu season begins.  Pneumonia vaccine.  Shingles vaccine.  Your health care provider may also recommend other immunizations. This information is not intended to replace advice given to you by your health care provider. Make sure you discuss any questions you  have with your health care provider. Document Released: 01/15/2006 Document Revised: 06/12/2016 Document Reviewed: 08/27/2015 Elsevier Interactive Patient Education  2018 [employer].

## 2018-05-30 ENCOUNTER — Other Ambulatory Visit: Payer: Self-pay | Admitting: Family Medicine

## 2018-05-30 DIAGNOSIS — F419 Anxiety disorder, unspecified: Secondary | ICD-10-CM

## 2018-06-01 ENCOUNTER — Encounter: Payer: Self-pay | Admitting: Neurology

## 2018-06-01 NOTE — Telephone Encounter (Signed)
Requesting: XANAX 1 MG Contract:11/24/17 UDS: 11/24/17 Low risk Last OV: 03/03/18 Next OV:- Last Refill:05/03/18   Please advise

## 2018-06-03 MED ORDER — ZOLMITRIPTAN 5 MG NA SOLN: 5.0000 mg | NASAL | 0 refills | Status: DC | PRN

## 2018-06-27 ENCOUNTER — Telehealth: Payer: Self-pay | Admitting: Neurology

## 2018-06-27 DIAGNOSIS — G43709 Chronic migraine without aura, not intractable, without status migrainosus: Secondary | ICD-10-CM

## 2018-06-27 MED ORDER — PREDNISONE 10 MG (21) PO TBPK: ORAL_TABLET | ORAL | 0 refills | Status: DC

## 2018-06-27 NOTE — Telephone Encounter (Signed)
Patient is having headaches daily and the medication is not helping she is having to stay our of work with headache and she is scared seh is going to loose her job please call

## 2018-06-27 NOTE — Telephone Encounter (Signed)
Called and LMOVM advising Pt of prednisone taper sent in. Advised to take 6,5,4,3,2,1and to call office tomorrow with any quiestions

## 2018-06-27 NOTE — Telephone Encounter (Signed)
She can get a prednisone taper

## 2018-06-28 ENCOUNTER — Other Ambulatory Visit: Payer: Self-pay | Admitting: Family Medicine

## 2018-06-28 DIAGNOSIS — F419 Anxiety disorder, unspecified: Secondary | ICD-10-CM

## 2018-06-28 NOTE — Telephone Encounter (Signed)
Pt is requesting refill on alprazolam.   Last OV: 05/17/2018 Last Fill: 06/01/2018 #30 and 1TE UDS: 11/24/2017 Low risk CSC: 11/24/2017

## 2018-07-24 ENCOUNTER — Telehealth: Payer: Self-pay | Admitting: Family Medicine

## 2018-07-24 DIAGNOSIS — R002 Palpitations: Secondary | ICD-10-CM

## 2018-07-24 DIAGNOSIS — I10 Essential (primary) hypertension: Secondary | ICD-10-CM

## 2018-07-25 NOTE — Telephone Encounter (Signed)
Metoprolol refill sent to pharmacy. Pt last seen for acute visit 02/2018 with PCP but has no future appts scheduled. Please advise when pt is due for routine OV?

## 2018-07-27 ENCOUNTER — Other Ambulatory Visit: Payer: Self-pay | Admitting: Family Medicine

## 2018-07-27 DIAGNOSIS — F419 Anxiety disorder, unspecified: Secondary | ICD-10-CM

## 2018-07-27 NOTE — Telephone Encounter (Signed)
Please call pt to schedule appt with Dr Carollee Herter soon. Thanks!

## 2018-07-27 NOTE — Telephone Encounter (Signed)
Left pt message for her to call us back to schedule appt with Dr. Carollee Herter.

## 2018-07-27 NOTE — Telephone Encounter (Signed)
Needing an emergency refill to last until her appt on 08-02-18.  Xanax refill Last Refill:06/28/18 # 30/0 refill Last OV: 03/03/18 PCP: Carollee Herter Pharmacy: [facility], [place]. [phone] (Phone) [phone] (Fax)

## 2018-07-27 NOTE — Telephone Encounter (Signed)
Overdue for ov ? °

## 2018-07-27 NOTE — Telephone Encounter (Unsigned)
Copied from [place] [phone]. Topic: Quick Communication - See Telephone Encounter >> Jul 27, 2018  4:28 PM Neva Seat wrote: ALPRAZolam Duanne Moron) 1 MG tablet  Needing an emergency refill to last until her appt on 08-02-18.  [facility] [place]. [place] [address] Phone: [phone] Fax: [phone]

## 2018-08-01 ENCOUNTER — Other Ambulatory Visit: Payer: Self-pay

## 2018-08-01 DIAGNOSIS — F419 Anxiety disorder, unspecified: Secondary | ICD-10-CM

## 2018-08-01 MED ORDER — ALPRAZOLAM 1 MG PO TABS: 1.0000 mg | ORAL_TABLET | Freq: Every evening | ORAL | 0 refills | Status: DC | PRN

## 2018-08-01 NOTE — Telephone Encounter (Signed)
Refill request for emergency supply of ALPRAZolam (XANAX) 1 MG tablet.   Last OV: 03/03/18 Last RF: 06/28/18  Pt has appt scheduled for 08/02/18 but sent an emergency refill request a few days ago to last until her visit. Please advise.

## 2018-08-01 NOTE — Telephone Encounter (Signed)
Request sent to provider for approval.  

## 2018-08-02 ENCOUNTER — Ambulatory Visit (INDEPENDENT_AMBULATORY_CARE_PROVIDER_SITE_OTHER): Payer: Medicare Other | Admitting: Family Medicine

## 2018-08-02 VITALS — BP 120/68 | HR 60 | Temp 97.8°F | Resp 16 | Ht 62.0 in | Wt 161.6 lb

## 2018-08-02 DIAGNOSIS — G43809 Other migraine, not intractable, without status migrainosus: Secondary | ICD-10-CM | POA: Diagnosis not present

## 2018-08-02 DIAGNOSIS — G43119 Migraine with aura, intractable, without status migrainosus: Secondary | ICD-10-CM

## 2018-08-02 DIAGNOSIS — E785 Hyperlipidemia, unspecified: Secondary | ICD-10-CM

## 2018-08-02 DIAGNOSIS — F419 Anxiety disorder, unspecified: Secondary | ICD-10-CM

## 2018-08-02 DIAGNOSIS — I10 Essential (primary) hypertension: Secondary | ICD-10-CM

## 2018-08-02 MED ORDER — FLUOXETINE HCL 20 MG PO CAPS: 20.0000 mg | ORAL_CAPSULE | Freq: Every day | ORAL | 3 refills | Status: DC

## 2018-08-02 MED ORDER — ALPRAZOLAM 1 MG PO TABS: 1.0000 mg | ORAL_TABLET | Freq: Every evening | ORAL | 2 refills | Status: DC | PRN

## 2018-08-02 MED ORDER — KETOROLAC TROMETHAMINE 60 MG/2ML IM SOLN
60.0000 mg | Freq: Once | INTRAMUSCULAR | Status: AC
  Administered 2018-08-02: 60 mg via INTRAMUSCULAR

## 2018-08-02 NOTE — Patient Instructions (Signed)
Migraine Headache A migraine headache is an intense, throbbing pain on one side or both sides of the head. Migraines may also cause other symptoms, such as nausea, vomiting, and sensitivity to light and noise. What are the causes? Doing or taking certain things may also trigger migraines, such as:  Alcohol.  Smoking.  Medicines, such as: ? Medicine used to treat chest pain (nitroglycerine). ? Birth control pills. ? Estrogen pills. ? Certain blood pressure medicines.  Aged cheeses, chocolate, or caffeine.  Foods or drinks that contain nitrates, glutamate, aspartame, or tyramine.  Physical activity.  Other things that may trigger a migraine include:  Menstruation.  Pregnancy.  Hunger.  Stress, lack of sleep, too much sleep, or fatigue.  Weather changes.  What increases the risk? The following factors may make you more likely to experience migraine headaches:  Age. Risk increases with age.  Family history of migraine headaches.  Being Caucasian.  Depression and anxiety.  Obesity.  Being a woman.  Having a hole in the heart (patent foramen ovale) or other heart problems.  What are the signs or symptoms? The main symptom of this condition is pulsating or throbbing pain. Pain may:  Happen in any area of the head, such as on one side or both sides.  Interfere with daily activities.  Get worse with physical activity.  Get worse with exposure to bright lights or loud noises.  Other symptoms may include:  Nausea.  Vomiting.  Dizziness.  General sensitivity to bright lights, loud noises, or smells.  Before you get a migraine, you may get warning signs that a migraine is developing (aura). An aura may include:  Seeing flashing lights or having blind spots.  Seeing bright spots, halos, or zigzag lines.  Having tunnel vision or blurred vision.  Having numbness or a tingling feeling.  Having trouble talking.  Having muscle weakness.  How is this  diagnosed? A migraine headache can be diagnosed based on:  Your symptoms.  A physical exam.  Tests, such as CT scan or MRI of the head. These imaging tests can help rule out other causes of headaches.  Taking fluid from the spine (lumbar puncture) and analyzing it (cerebrospinal fluid analysis, or CSF analysis).  How is this treated? A migraine headache is usually treated with medicines that:  Relieve pain.  Relieve nausea.  Prevent migraines from coming back.  Treatment may also include:  Acupuncture.  Lifestyle changes like avoiding foods that trigger migraines.  Follow these instructions at home: Medicines  Take over-the-counter and prescription medicines only as told by your health care provider.  Do not drive or use heavy machinery while taking prescription pain medicine.  To prevent or treat constipation while you are taking prescription pain medicine, your health care provider may recommend that you: ? Drink enough fluid to keep your urine clear or pale yellow. ? Take over-the-counter or prescription medicines. ? Eat foods that are high in fiber, such as fresh fruits and vegetables, whole grains, and beans. ? Limit foods that are high in fat and processed sugars, such as fried and sweet foods. Lifestyle  Avoid alcohol use.  Do not use any products that contain nicotine or tobacco, such as cigarettes and e-cigarettes. If you need help quitting, ask your health care provider.  Get at least 8 hours of sleep every night.  Limit your stress. General instructions   Keep a journal to find out what may trigger your migraine headaches. For example, write down: ? What you eat and   drink. ? How much sleep you get. ? Any change to your diet or medicines.  If you have a migraine: ? Avoid things that make your symptoms worse, such as bright lights. ? It may help to lie down in a dark, quiet room. ? Do not drive or use heavy machinery. ? Ask your health care provider  what activities are safe for you while you are experiencing symptoms.  Keep all follow-up visits as told by your health care provider. This is important. Contact a health care provider if:  You develop symptoms that are different or more severe than your usual migraine symptoms. Get help right away if:  Your migraine becomes severe.  You have a fever.  You have a stiff neck.  You have vision loss.  Your muscles feel weak or like you cannot control them.  You start to lose your balance often.  You develop trouble walking.  You faint. This information is not intended to replace advice given to you by your health care provider. Make sure you discuss any questions you have with your health care provider. Document Released: 11/23/2005 Document Revised: 06/12/2016 Document Reviewed: 05/11/2016 Elsevier Interactive Patient Education  2017 [employer]   

## 2018-08-02 NOTE — Progress Notes (Signed)
Patient ID: Ebony Garcia, female   DOB: 1951-07-18, 67 y.o.   MRN: [id number]     Subjective:  I acted as a Education administrator for Dr. Carollee Herter.  [name], [place]   Patient ID: Ebony Garcia, female    DOB: [birth year], 67 y.o.   MRN: [id number]  Chief Complaint  Patient presents with  . Headache    HPI  Patient is in today for headache.  Started this morning around 7am.  otc is not working  She has f/u with neuro She also needs refills of her anxiety medication Pt will have labs and cpe later  Patient Care Team: Carollee Herter, Alferd Apa, DO as PCP - General (Family Medicine) Janann August, MD as Referring Physician (Dermatology) Aloha Gell, MD as Consulting Physician (Obstetrics and Gynecology) Pieter Partridge, DO as Consulting Physician (Neurology) [place], Jennefer Bravo, MD as Consulting Physician (Dermatology) Minus Breeding, MD as Consulting Physician (Cardiology) Karie Georges, OD as Consulting Physician (Optometry) Bunnie Domino, LPN as Licensed Practical Nurse   Past Medical History:  Diagnosis Date  . Cancer ([place])   . CHF (congestive heart failure) ([place])    dystolic heart dysfunction  . Chronic kidney disease   . Depression   . Depression with anxiety   . Diabetes mellitus without complication ([place])    Diet controlled  . Hypertension   . Intertrigo   . Migraines   . Panic attacks     Past Surgical History:  Procedure Laterality Date  . ABDOMINAL HYSTERECTOMY    . BREAST SURGERY    . DILATION AND CURETTAGE OF UTERUS    . NASAL SEPTUM SURGERY    . papaloma Right   . TONSILLECTOMY AND ADENOIDECTOMY    . TOTAL ABDOMINAL HYSTERECTOMY W/ BILATERAL SALPINGOOPHORECTOMY     for fibroids and migraines  . TUBAL LIGATION      Family History  Problem Relation Age of Onset  . Crohn's disease Mother   . Atrial fibrillation Mother   . Heart failure Mother   . Bladder Cancer Father   . Cancer Father        bladder  . Bipolar disorder Brother   . Cancer  Brother   . Bipolar disorder Sister   . Heart attack Maternal Grandmother        >65  . Cancer Maternal Aunt   . Colon cancer Maternal Uncle   . Cancer Paternal Grandfather   . Cancer Maternal Aunt   . Cancer Cousin   . Anemia Cousin   . Diabetes Neg Hx   . Stroke Neg Hx     Social History   Socioeconomic History  . Marital status: Divorced    Spouse name: Not on file  . Number of children: 3  . Years of education: Not on file  . Highest education level: Not on file  Occupational History    Employer: UNEMPLOYED  Social Needs  . Financial resource strain: Not on file  . Food insecurity:    Worry: Not on file    Inability: Not on file  . Transportation needs:    Medical: Not on file    Non-medical: Not on file  Tobacco Use  . Smoking status: Never Smoker  . Smokeless tobacco: Never Used  Substance and Sexual Activity  . Alcohol use: No    Alcohol/week: 0.0 standard drinks  . Drug use: No  . Sexual activity: Not Currently    Partners: Male  Lifestyle  . Physical activity:    Days  per week: Not on file    Minutes per session: Not on file  . Stress: Not on file  Relationships  . Social connections:    Talks on phone: Not on file    Gets together: Not on file    Attends religious service: Not on file    Active member of club or organization: Not on file    Attends meetings of clubs or organizations: Not on file    Relationship status: Not on file  . Intimate partner violence:    Fear of current or ex partner: Not on file    Emotionally abused: Not on file    Physically abused: Not on file    Forced sexual activity: Not on file  Other Topics Concern  . Not on file  Social History Narrative   Lives alone.            Outpatient Medications Prior to Visit  Medication Sig Dispense Refill  . estradiol (ESTRACE) 0.5 MG tablet Take 0.5 mg by mouth daily. 1/2 tab daily    . furosemide (LASIX) 20 MG tablet TAKE ONE TABLET BY MOUTH ONCE DAILY 90 tablet 3  .  metoprolol succinate (TOPROL-XL) 50 MG 24 hr tablet TAKE 1 TABLET BY MOUTH ONCE DAILY WITH  OR  IMMEDIATELY  FOLLOWING  A  MEAL 90 tablet 0  . nitroGLYCERIN (NITROSTAT) 0.4 MG SL tablet Place 1 tablet (0.4 mg total) under the tongue every 5 (five) minutes as needed for chest pain. 25 tablet 3  . predniSONE (STERAPRED UNI-PAK 21 TAB) 10 MG (21) TBPK tablet As directed 21 tablet 0  . promethazine (PHENERGAN) 25 MG tablet TAKE ONE TABLET BY MOUTH EVERY 6 HOURS AS NEEDED FOR NAUSEA AND VOMITING 30 tablet 2  . rizatriptan (MAXALT) 10 MG tablet TAKE 1 TABLET EARLIEST ONSET OF HEADACHE. MAY REPEAT ONCE IN 2 HOURS IF NEEDED 10 tablet 1  . topiramate (TOPAMAX) 100 MG tablet TAKE 1 TABLET BY MOUTH TWICE DAILY 60 tablet 5  . triamcinolone ointment (KENALOG) 0.1 % Apply topically as needed    . zolmitriptan (ZOMIG) 5 MG nasal solution Place 1 spray into the nose as needed for migraine. 3 Units 0  . ALPRAZolam (XANAX) 1 MG tablet Take 1 tablet (1 mg total) by mouth at bedtime as needed. for sleep 30 tablet 0  . FLUoxetine (PROZAC) 20 MG capsule TAKE 1 CAPSULE BY MOUTH ONCE DAILY 30 capsule 6   No facility-administered medications prior to visit.     Allergies  Allergen Reactions  . Amoxicillin-Pot Clavulanate Diarrhea  . Antihistamines, Diphenhydramine-Type Other (See Comments)    Other Reaction: makes pt nervous  . Diphenhydramine Hcl Other (See Comments)    Other Reaction: makes pt nervous  . Amoxicillin-Pot Clavulanate Diarrhea  . Clindamycin     She has not taken but per patient  "her mother had a reaction and she does not want it ever"  . Hydrocodone-Acetaminophen     REACTION: sensitive to  but can take for extreme pain per patient  . Nortriptyline   . Propoxyphene Hcl Nausea Only  . Propoxyphene N-Acetaminophen Nausea Only  . Sulfa Antibiotics     Unknown   . Tramadol Other (See Comments)    Pt does know what kind of effect  . Fremanezumab-Vfrm Rash and Other (See Comments)    Seizure,  jerking  . Lisinopril     Restless , irregular sleep    Review of Systems  Constitutional: Negative for fever and malaise/fatigue.  HENT: Negative for congestion.   Eyes: Negative for blurred vision.  Respiratory: Negative for cough and shortness of breath.   Cardiovascular: Negative for chest pain, palpitations and leg swelling.  Gastrointestinal: Negative for vomiting.  Musculoskeletal: Negative for back pain.  Skin: Negative for rash.  Neurological: Positive for headaches. Negative for loss of consciousness.  Psychiatric/Behavioral: Negative for depression, hallucinations, memory loss, substance abuse and suicidal ideas. The patient is not nervous/anxious and does not have insomnia.        Objective:    Physical Exam  Constitutional: She is oriented to person, place, and time. She appears well-developed and well-nourished.  HENT:  Head: Normocephalic and atraumatic.  Eyes: Conjunctivae and EOM are normal.  Neck: Normal range of motion. Neck supple. No JVD present. Carotid bruit is not present. No thyromegaly present.  Cardiovascular: Normal rate, regular rhythm and normal heart sounds.  No murmur heard. Pulmonary/Chest: Effort normal and breath sounds normal. No respiratory distress. She has no wheezes. She has no rales. She exhibits no tenderness.  Musculoskeletal: She exhibits no edema.  Neurological: She is alert and oriented to person, place, and time. She is not disoriented. No cranial nerve deficit or sensory deficit.  Psychiatric: She has a normal mood and affect.  Nursing note and vitals reviewed.   BP 120/68 (BP Location: Right Arm, Cuff Size: Large)   Pulse 60   Temp 97.8 F (36.6 C) (Oral)   Resp 16   Ht 5\' 2"  (1.575 m)   Wt 161 lb 9.6 oz (73.3 kg)   SpO2 98%   BMI 29.56 kg/m  Wt Readings from Last 3 Encounters:  08/02/18 161 lb 9.6 oz (73.3 kg)  05/17/18 167 lb 3.2 oz (75.8 kg)  04/19/18 168 lb (76.2 kg)   BP Readings from Last 3 Encounters:    08/02/18 120/68  05/17/18 130/70  04/19/18 126/82     Immunization History  Administered Date(s) Administered  . Hepatitis B, adult 07/30/2016, 09/29/2016  . Influenza Split 09/19/2012  . Influenza Whole 11/08/2009  . Influenza,inj,Quad PF,6+ Mos 11/07/2013, 07/30/2016, 09/20/2017  . Influenza-Unspecified 09/11/2015  . PPD Test 07/30/2016, 11/03/2017  . Td 11/08/2009    Health Maintenance  Topic Date Due  . COLONOSCOPY  12/12/2014  . MAMMOGRAM  09/10/2017  . INFLUENZA VACCINE  07/07/2018  . TETANUS/TDAP  11/09/2019  . Hepatitis C Screening  Completed  . HIV Screening  Completed    Lab Results  Component Value Date   WBC 7.6 03/16/2018   HGB 14.0 03/16/2018   HCT 42.8 03/16/2018   PLT 96 (L) 03/16/2018   GLUCOSE 95 03/16/2018   CHOL 203 (H) 09/20/2017   TRIG 201.0 (H) 09/20/2017   HDL 43.90 09/20/2017   LDLDIRECT 121.0 09/20/2017   LDLCALC 80 05/14/2017   ALT 16 03/16/2018   AST 18 03/16/2018   NA 141 03/16/2018   K 3.5 03/16/2018   CL 112 (H) 03/16/2018   CREATININE 0.97 03/16/2018   BUN 14 03/16/2018   CO2 21 (L) 03/16/2018   TSH 3.62 11/03/2016   INR 0.93 07/19/2010   HGBA1C 5.9 12/08/2012   MICROALBUR 1.1 09/20/2017    Lab Results  Component Value Date   TSH 3.62 11/03/2016   Lab Results  Component Value Date   WBC 7.6 03/16/2018   HGB 14.0 03/16/2018   HCT 42.8 03/16/2018   MCV 94.1 03/16/2018   PLT 96 (L) 03/16/2018   Lab Results  Component Value Date   NA 141 03/16/2018  K 3.5 03/16/2018   CO2 21 (L) 03/16/2018   GLUCOSE 95 03/16/2018   BUN 14 03/16/2018   CREATININE 0.97 03/16/2018   BILITOT 0.4 03/16/2018   ALKPHOS 89 03/16/2018   AST 18 03/16/2018   ALT 16 03/16/2018   PROT 7.4 03/16/2018   ALBUMIN 4.1 03/16/2018   CALCIUM 9.4 03/16/2018   ANIONGAP 8 03/16/2018   GFR 58.66 (L) 09/20/2017   Lab Results  Component Value Date   CHOL 203 (H) 09/20/2017   Lab Results  Component Value Date   HDL 43.90 09/20/2017   Lab  Results  Component Value Date   LDLCALC 80 05/14/2017   Lab Results  Component Value Date   TRIG 201.0 (H) 09/20/2017   Lab Results  Component Value Date   CHOLHDL 5 09/20/2017   Lab Results  Component Value Date   HGBA1C 5.9 12/08/2012         Assessment & Plan:   Problem List Items Addressed This Visit      Unprioritized   Anxiety    Stable Refill meds      Relevant Medications   ALPRAZolam (XANAX) 1 MG tablet   FLUoxetine (PROZAC) 20 MG capsule   Essential hypertension    Well controlled, no changes to meds. Encouraged heart healthy diet such as the DASH diet and exercise as tolerated.       Hyperlipidemia    Encouraged heart healthy diet, increase exercise, avoid trans fats, consider a krill oil cap daily Repeat labs with cpe      Intractable migraine with aura without status migrainosus - Primary   Relevant Medications   FLUoxetine (PROZAC) 20 MG capsule   ketorolac (TORADOL) injection 60 mg (Completed)   Migraine variant    toradol given F/u neuro       Relevant Medications   FLUoxetine (PROZAC) 20 MG capsule   ketorolac (TORADOL) injection 60 mg (Completed)      I have changed Ebony Garcia's FLUoxetine. I am also having her maintain her nitroGLYCERIN, triamcinolone ointment, estradiol, promethazine, furosemide, topiramate, rizatriptan, zolmitriptan, predniSONE, metoprolol succinate, and ALPRAZolam. We administered ketorolac.  Meds ordered this encounter  Medications  . ALPRAZolam (XANAX) 1 MG tablet    Sig: Take 1 tablet (1 mg total) by mouth at bedtime as needed. for sleep    Dispense:  30 tablet    Refill:  2  . FLUoxetine (PROZAC) 20 MG capsule    Sig: Take 1 capsule (20 mg total) by mouth daily.    Dispense:  90 capsule    Refill:  3    Please consider 90 day supplies to promote better adherence  . ketorolac (TORADOL) injection 60 mg    CMA served as scribe during this visit. History, Physical and Plan performed by medical  provider. Documentation and orders reviewed and attested to.  [name], DO

## 2018-08-03 ENCOUNTER — Encounter: Payer: Self-pay | Admitting: Family Medicine

## 2018-08-03 DIAGNOSIS — G43119 Migraine with aura, intractable, without status migrainosus: Secondary | ICD-10-CM

## 2018-08-03 HISTORY — DX: Migraine with aura, intractable, without status migrainosus: G43.119

## 2018-08-03 NOTE — Assessment & Plan Note (Signed)
Stable. Refill meds

## 2018-08-03 NOTE — Assessment & Plan Note (Signed)
Well controlled, no changes to meds. Encouraged heart healthy diet such as the DASH diet and exercise as tolerated.  °

## 2018-08-03 NOTE — Assessment & Plan Note (Signed)
toradol given F/u neuro

## 2018-08-03 NOTE — Assessment & Plan Note (Signed)
Encouraged heart healthy diet, increase exercise, avoid trans fats, consider a krill oil cap daily Repeat labs with cpe

## 2018-09-13 ENCOUNTER — Other Ambulatory Visit: Payer: Self-pay | Admitting: Family

## 2018-09-13 DIAGNOSIS — D696 Thrombocytopenia, unspecified: Secondary | ICD-10-CM

## 2018-09-14 ENCOUNTER — Inpatient Hospital Stay: Payer: Medicare Other

## 2018-09-14 ENCOUNTER — Inpatient Hospital Stay: Payer: Medicare Other | Attending: Family | Admitting: Family

## 2018-09-14 ENCOUNTER — Encounter: Payer: Self-pay | Admitting: Family

## 2018-09-14 ENCOUNTER — Other Ambulatory Visit: Payer: Self-pay

## 2018-09-14 VITALS — BP 123/78 | HR 50 | Temp 98.1°F | Resp 20 | Wt 163.0 lb

## 2018-09-14 DIAGNOSIS — D6959 Other secondary thrombocytopenia: Secondary | ICD-10-CM | POA: Diagnosis not present

## 2018-09-14 DIAGNOSIS — D696 Thrombocytopenia, unspecified: Secondary | ICD-10-CM | POA: Diagnosis not present

## 2018-09-14 DIAGNOSIS — F329 Major depressive disorder, single episode, unspecified: Secondary | ICD-10-CM | POA: Insufficient documentation

## 2018-09-14 DIAGNOSIS — E538 Deficiency of other specified B group vitamins: Secondary | ICD-10-CM

## 2018-09-14 DIAGNOSIS — Z23 Encounter for immunization: Secondary | ICD-10-CM | POA: Diagnosis not present

## 2018-09-14 DIAGNOSIS — Z79899 Other long term (current) drug therapy: Secondary | ICD-10-CM | POA: Diagnosis not present

## 2018-09-14 LAB — VITAMIN B12: Vitamin B-12: 149 pg/mL — ABNORMAL LOW (ref 180–914)

## 2018-09-14 LAB — CBC WITH DIFFERENTIAL (CANCER CENTER ONLY)
Abs Immature Granulocytes: 0.01 10*3/uL (ref 0.00–0.07)
Basophils Absolute: 0.1 10*3/uL (ref 0.0–0.1)
Basophils Relative: 1 %
Eosinophils Absolute: 0.2 10*3/uL (ref 0.0–0.5)
Eosinophils Relative: 2 %
HCT: 44.6 % (ref 36.0–46.0)
Hemoglobin: 13.6 g/dL (ref 12.0–15.0)
Immature Granulocytes: 0 %
Lymphocytes Relative: 21 %
Lymphs Abs: 1.4 10*3/uL (ref 0.7–4.0)
MCH: 29.6 pg (ref 26.0–34.0)
MCHC: 30.5 g/dL (ref 30.0–36.0)
MCV: 97.2 fL (ref 80.0–100.0)
Monocytes Absolute: 0.4 10*3/uL (ref 0.1–1.0)
Monocytes Relative: 7 %
Neutro Abs: 4.6 10*3/uL (ref 1.7–7.7)
Neutrophils Relative %: 69 %
Platelet Count: 97 10*3/uL — ABNORMAL LOW (ref 150–400)
RBC: 4.59 MIL/uL (ref 3.87–5.11)
RDW: 13.1 % (ref 11.5–15.5)
WBC Count: 6.7 10*3/uL (ref 4.0–10.5)
nRBC: 0 % (ref 0.0–0.2)

## 2018-09-14 LAB — PLATELET BY CITRATE

## 2018-09-14 LAB — CMP (CANCER CENTER ONLY)
ALT: 10 U/L (ref 0–44)
AST: 15 U/L (ref 15–41)
Albumin: 4.1 g/dL (ref 3.5–5.0)
Alkaline Phosphatase: 79 U/L (ref 38–126)
Anion gap: 10 (ref 5–15)
BUN: 15 mg/dL (ref 8–23)
CO2: 23 mmol/L (ref 22–32)
Calcium: 9.3 mg/dL (ref 8.9–10.3)
Chloride: 107 mmol/L (ref 98–111)
Creatinine: 1.15 mg/dL — ABNORMAL HIGH (ref 0.44–1.00)
GFR, Est AFR Am: 57 mL/min — ABNORMAL LOW (ref >60)
GFR, Estimated: 49 mL/min — ABNORMAL LOW (ref >60)
Glucose, Bld: 111 mg/dL — ABNORMAL HIGH (ref 70–99)
Potassium: 3.8 mmol/L (ref 3.5–5.1)
Sodium: 140 mmol/L (ref 135–145)
Total Bilirubin: 0.4 mg/dL (ref 0.3–1.2)
Total Protein: 7.3 g/dL (ref 6.5–8.1)

## 2018-09-14 MED ORDER — INFLUENZA VAC SPLIT QUAD 0.5 ML IM SUSY
0.5000 mL | PREFILLED_SYRINGE | Freq: Once | INTRAMUSCULAR | Status: AC
  Administered 2018-09-14: 0.5 mL via INTRAMUSCULAR

## 2018-09-14 MED ORDER — INFLUENZA VAC SPLIT QUAD 0.5 ML IM SUSY
PREFILLED_SYRINGE | INTRAMUSCULAR | Status: AC
  Filled 2018-09-14: qty 0.5

## 2018-09-14 NOTE — Progress Notes (Signed)
Hematology and Oncology Follow Up Visit  Ebony Garcia [id number] 09-26-1951 67 y.o. 09/14/2018   Principle Diagnosis:  Medication induced thrombocytopenia  Current Therapy:   Observation   Interim History: [name] is here today for follow-up. 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  She is lonely at home which has caused some anxiety and depression. She denies feeling suicidal.  She is considering finding a new church.  She has occasional episodes of palpitations and angina and states that she is followed by cardiology.  No fever, chills, n/v, cough, rash, dizziness, SOB, abdominal pain or changes in bowel or bladder habits.  She has occasional episodes of diarrhea and will take pepto bismol or imodium if needed.  No swelling in her extremities. She take lasix once a day which helps reduce fluid retention.  She has positional numbness and tingling in her arms when sleeping at night.  No lymphadenopathy noted on exam.  No episodes of bleeding, no bruising or petechiae. Platelet count is stable at 97, Hgb 13.6.  Her appetite comes and goes. She is staying well hydrated. Her weight is stable.   ECOG Performance Status: 1 - Symptomatic but completely ambulatory  Medications:  Allergies as of 09/14/2018      Reactions   Amoxicillin-pot Clavulanate Diarrhea   Antihistamines, Diphenhydramine-type Other (See Comments)   Other Reaction: makes pt nervous   Diphenhydramine Hcl Other (See Comments)   Other Reaction: makes pt nervous   Amoxicillin-pot Clavulanate Diarrhea   Clindamycin    She has not taken but per patient  "her mother had a reaction and she does not want it ever"   Hydrocodone-acetaminophen    REACTION: sensitive to  but can take for extreme pain per patient   Nortriptyline    Propoxyphene Hcl Nausea Only   Propoxyphene N-acetaminophen Nausea Only   Sulfa Antibiotics    Unknown    Tramadol Other (See Comments)   Pt does  know what kind of effect   Fremanezumab-vfrm Rash, Other (See Comments)   Seizure, jerking   Lisinopril    Restless , irregular sleep      Medication List        Accurate as of 09/14/18 12:26 PM. Always use your most recent med list.          ALPRAZolam 1 MG tablet Commonly known as:  XANAX Take 1 tablet (1 mg total) by mouth at bedtime as needed. for sleep   estradiol 0.5 MG tablet Commonly known as:  ESTRACE Take 0.5 mg by mouth daily. 1/2 tab daily   FLUoxetine 20 MG capsule Commonly known as:  PROZAC Take 1 capsule (20 mg total) by mouth daily.   furosemide 20 MG tablet Commonly known as:  LASIX TAKE ONE TABLET BY MOUTH ONCE DAILY   metoprolol succinate 50 MG 24 hr tablet Commonly known as:  TOPROL-XL TAKE 1 TABLET BY MOUTH ONCE DAILY WITH  OR  IMMEDIATELY  FOLLOWING  A  MEAL   nitroGLYCERIN 0.4 MG SL tablet Commonly known as:  NITROSTAT Place 1 tablet (0.4 mg total) under the tongue every 5 (five) minutes as needed for chest pain.   predniSONE 10 MG (21) Tbpk tablet Commonly known as:  STERAPRED UNI-PAK 21 TAB As directed   promethazine 25 MG tablet Commonly known as:  PHENERGAN TAKE ONE TABLET BY MOUTH EVERY 6 HOURS AS NEEDED FOR NAUSEA AND VOMITING   rizatriptan 10 MG tablet Commonly known as:  MAXALT  TAKE 1 TABLET EARLIEST ONSET OF HEADACHE. MAY REPEAT ONCE IN 2 HOURS IF NEEDED   topiramate 100 MG tablet Commonly known as:  TOPAMAX TAKE 1 TABLET BY MOUTH TWICE DAILY   triamcinolone ointment 0.1 % Commonly known as:  KENALOG Apply topically as needed   zolmitriptan 5 MG nasal solution Commonly known as:  ZOMIG Place 1 spray into the nose as needed for migraine.       Allergies:  Allergies  Allergen Reactions  . Amoxicillin-Pot Clavulanate Diarrhea  . Antihistamines, Diphenhydramine-Type Other (See Comments)    Other Reaction: makes pt nervous  . Diphenhydramine Hcl Other (See Comments)    Other Reaction: makes pt nervous  .  Amoxicillin-Pot Clavulanate Diarrhea  . Clindamycin     She has not taken but per patient  "her mother had a reaction and she does not want it ever"  . Hydrocodone-Acetaminophen     REACTION: sensitive to  but can take for extreme pain per patient  . Nortriptyline   . Propoxyphene Hcl Nausea Only  . Propoxyphene N-Acetaminophen Nausea Only  . Sulfa Antibiotics     Unknown   . Tramadol Other (See Comments)    Pt does know what kind of effect  . Fremanezumab-Vfrm Rash and Other (See Comments)    Seizure, jerking  . Lisinopril     Restless , irregular sleep    Past Medical History, Surgical history, Social history, and Family History were reviewed and updated.  Review of Systems: All other 10 point review of systems is negative.   Physical Exam:  weight is 163 lb (73.9 kg). Her oral temperature is 98.1 F (36.7 C). Her blood pressure is 123/78 and her pulse is 50 (abnormal). Her respiration is 20 and oxygen saturation is 99%.   Wt Readings from Last 3 Encounters:  09/14/18 163 lb (73.9 kg)  08/02/18 161 lb 9.6 oz (73.3 kg)  05/17/18 167 lb 3.2 oz (75.8 kg)    Ocular: Sclerae unicteric, pupils equal, round and reactive to light Ear-nose-throat: Oropharynx clear, dentition fair Lymphatic: No cervical, supraclavicular ir axillary adenopathy Lungs no rales or rhonchi, good excursion bilaterally Heart regular rate and rhythm, no murmur appreciated Abd soft, nontender, positive bowel sounds, no liver or spleen tip palpated on exam, no fluid wave  MSK no focal spinal tenderness, no joint edema Neuro: non-focal, well-oriented, appropriate affect Breasts: Deferred   Lab Results  Component Value Date   WBC 6.7 09/14/2018   HGB 13.6 09/14/2018   HCT 44.6 09/14/2018   MCV 97.2 09/14/2018   PLT 97 (L) 09/14/2018   No results found for: FERRITIN, IRON, TIBC, UIBC, IRONPCTSAT Lab Results  Component Value Date   RBC 4.59 09/14/2018   No results found for: KPAFRELGTCHN, LAMBDASER,  KAPLAMBRATIO No results found for: IGGSERUM, IGA, IGMSERUM No results found for: Odetta Pink, SPEI   Chemistry      Component Value Date/Time   NA 141 03/16/2018 1104   NA 143 11/11/2017 1339   K 3.5 03/16/2018 1104   K 3.6 11/11/2017 1339   CL 112 (H) 03/16/2018 1104   CL 109 (H) 11/11/2017 1339   CO2 21 (L) 03/16/2018 1104   CO2 24 11/11/2017 1339   BUN 14 03/16/2018 1104   BUN 9 11/11/2017 1339   CREATININE 0.97 03/16/2018 1104   CREATININE 1.0 11/11/2017 1339      Component Value Date/Time   CALCIUM 9.4 03/16/2018 1104   CALCIUM 9.3 11/11/2017 1339  ALKPHOS 89 03/16/2018 1104   ALKPHOS 85 (H) 11/11/2017 1339   AST 18 03/16/2018 1104   ALT 16 03/16/2018 1104   ALT 24 11/11/2017 1339   BILITOT 0.4 03/16/2018 1104      Impression and Plan: [name] is a very pleasant 67 yo caucasian female with medication induced thrombocytopenia. Her counts remain stable and she has had no issue since we last saw her.  We will see her again in another 6 months.  She will contact our office with any questions or concerns. We can certainly see her sooner if need be.   Laverna Peace, NP 10/9/201912:26 PM

## 2018-09-14 NOTE — Patient Instructions (Signed)

## 2018-09-15 ENCOUNTER — Other Ambulatory Visit: Payer: Self-pay | Admitting: Family

## 2018-09-15 DIAGNOSIS — E538 Deficiency of other specified B group vitamins: Secondary | ICD-10-CM

## 2018-09-16 ENCOUNTER — Telehealth: Payer: Self-pay | Admitting: *Deleted

## 2018-09-16 NOTE — Telephone Encounter (Addendum)
Patient is aware of results and recommendations for supplements. She will start this weekend.   ----- Message from Eliezer Bottom, NP sent at 09/15/2018  2:59 PM EDT ----- Regarding: B12  She can start 1,000 mcg sublingual daily (over the counter) and come back in for repeat lab in 8 weeks. Needs to try this prior to injections. Scheduling message sent to [facility]. Thank you!  [name]  ----- Message ----- From: Buel Ream, Lab In [place] Sent: 09/14/2018  11:43 AM EDT To: Eliezer Bottom, NP

## 2018-09-19 ENCOUNTER — Telehealth: Payer: Self-pay | Admitting: Hematology & Oncology

## 2018-09-19 NOTE — Telephone Encounter (Signed)
lmom to inform pt of lab only appt in 8 weeks 12/11 @ 11 am per sch msg

## 2018-09-20 ENCOUNTER — Ambulatory Visit: Payer: Medicare Other

## 2018-09-20 ENCOUNTER — Telehealth: Payer: Self-pay | Admitting: Neurology

## 2018-09-20 ENCOUNTER — Telehealth: Payer: Self-pay | Admitting: Family Medicine

## 2018-09-20 NOTE — Telephone Encounter (Signed)
Called and spoke with Pt, advised her if she had a driver, she can come any time tomorrow morning, Pt said she will be here around 10am

## 2018-09-20 NOTE — Telephone Encounter (Signed)
Copied from [place] [id number]. Topic: Quick Communication - See Telephone Encounter >> Sep 20, 2018 11:30 AM Antonieta Iba C wrote: CRM for notification. See Telephone encounter for: 09/20/18.  Pt called in to schedule ov for today. Pt says that she has a terrible migraine and usually received injections by provider when she has one. Not showing any openings. Please assist .

## 2018-09-20 NOTE — Telephone Encounter (Signed)
Patient is calling in wanting to get a cocktail injection with the benedryl for her 2 day migraine. Can she be seen tomorrow? Please call her at [phone]. Thanks!

## 2018-09-20 NOTE — Telephone Encounter (Signed)
Left message on machine to apologize about not getting her in today.  She does have an appointment in the morning with neuro.

## 2018-09-21 ENCOUNTER — Ambulatory Visit (INDEPENDENT_AMBULATORY_CARE_PROVIDER_SITE_OTHER): Payer: Medicare Other

## 2018-09-21 DIAGNOSIS — G43709 Chronic migraine without aura, not intractable, without status migrainosus: Secondary | ICD-10-CM | POA: Diagnosis not present

## 2018-09-21 MED ORDER — DIPHENHYDRAMINE HCL 50 MG/ML IJ SOLN
25.0000 mg | Freq: Once | INTRAMUSCULAR | Status: AC
  Administered 2018-09-21: 25 mg via INTRAMUSCULAR

## 2018-09-21 MED ORDER — KETOROLAC TROMETHAMINE 60 MG/2ML IM SOLN
60.0000 mg | Freq: Once | INTRAMUSCULAR | Status: AC
  Administered 2018-09-21: 60 mg via INTRAMUSCULAR

## 2018-09-21 MED ORDER — METOCLOPRAMIDE HCL 5 MG/ML IJ SOLN
10.0000 mg | Freq: Once | INTRAVENOUS | Status: AC
  Administered 2018-09-21: 10 mg via INTRAMUSCULAR

## 2018-09-26 ENCOUNTER — Other Ambulatory Visit: Payer: Self-pay | Admitting: Neurology

## 2018-09-27 ENCOUNTER — Ambulatory Visit (INDEPENDENT_AMBULATORY_CARE_PROVIDER_SITE_OTHER): Payer: Medicare Other

## 2018-09-27 ENCOUNTER — Telehealth: Payer: Self-pay | Admitting: Neurology

## 2018-09-27 DIAGNOSIS — G43709 Chronic migraine without aura, not intractable, without status migrainosus: Secondary | ICD-10-CM | POA: Diagnosis not present

## 2018-09-27 DIAGNOSIS — IMO0002 Reserved for concepts with insufficient information to code with codable children: Secondary | ICD-10-CM

## 2018-09-27 MED ORDER — KETOROLAC TROMETHAMINE 60 MG/2ML IM SOLN
60.0000 mg | Freq: Once | INTRAMUSCULAR | Status: AC
  Administered 2018-09-27: 60 mg via INTRAMUSCULAR

## 2018-09-27 NOTE — Telephone Encounter (Signed)
Patient called and is needing to have a Toradol injection today. She is having a bad Migraine. Please Call. Thanks

## 2018-09-27 NOTE — Telephone Encounter (Signed)
Called and spoke with Pt, she is coming for a toradol injection

## 2018-10-05 ENCOUNTER — Encounter: Payer: Self-pay | Admitting: Family

## 2018-10-05 ENCOUNTER — Telehealth: Payer: Self-pay | Admitting: *Deleted

## 2018-10-05 NOTE — Telephone Encounter (Signed)
Received message from patient asking for Vitamin B12 injections because she cant tolerate the supplements.  Spoke to Laverna Peace NP who ordered B12 injections monthly and follow up labs

## 2018-10-06 ENCOUNTER — Encounter: Payer: Self-pay | Admitting: Family

## 2018-10-06 ENCOUNTER — Telehealth: Payer: Self-pay | Admitting: Hematology & Oncology

## 2018-10-06 NOTE — Telephone Encounter (Signed)
lmom to inform pt of injection appt 11/5 at noon per staff msg

## 2018-10-07 ENCOUNTER — Other Ambulatory Visit: Payer: Self-pay | Admitting: Family

## 2018-10-07 DIAGNOSIS — E538 Deficiency of other specified B group vitamins: Secondary | ICD-10-CM

## 2018-10-07 HISTORY — DX: Deficiency of other specified B group vitamins: E53.8

## 2018-10-11 ENCOUNTER — Inpatient Hospital Stay: Payer: Medicare Other | Attending: Hematology & Oncology

## 2018-10-11 ENCOUNTER — Other Ambulatory Visit: Payer: Self-pay

## 2018-10-11 VITALS — BP 120/74 | HR 53 | Temp 98.3°F | Resp 18

## 2018-10-11 DIAGNOSIS — D696 Thrombocytopenia, unspecified: Secondary | ICD-10-CM | POA: Insufficient documentation

## 2018-10-11 DIAGNOSIS — D6959 Other secondary thrombocytopenia: Secondary | ICD-10-CM | POA: Diagnosis present

## 2018-10-11 DIAGNOSIS — E538 Deficiency of other specified B group vitamins: Secondary | ICD-10-CM

## 2018-10-11 DIAGNOSIS — N189 Chronic kidney disease, unspecified: Secondary | ICD-10-CM | POA: Diagnosis not present

## 2018-10-11 MED ORDER — CYANOCOBALAMIN 1000 MCG/ML IJ SOLN
INTRAMUSCULAR | Status: AC
  Filled 2018-10-11: qty 1

## 2018-10-11 MED ORDER — CYANOCOBALAMIN 1000 MCG/ML IJ SOLN
1000.0000 ug | Freq: Once | INTRAMUSCULAR | Status: AC
  Administered 2018-10-11: 1000 ug via INTRAMUSCULAR

## 2018-10-11 NOTE — Patient Instructions (Signed)
Cyanocobalamin, Vitamin B12 injection What is this medicine? CYANOCOBALAMIN (sye an oh koe BAL a min) is a man made form of vitamin B12. Vitamin B12 is used in the growth of healthy blood cells, nerve cells, and proteins in the body. It also helps with the metabolism of fats and carbohydrates. This medicine is used to treat people who can not absorb vitamin B12. This medicine may be used for other purposes; ask your health care provider or pharmacist if you have questions. COMMON BRAND NAME(S): B-12 Compliance Kit, B-12 Injection Kit, Cyomin, LA-12, Nutri-Twelve, Physicians EZ Use B-12, Primabalt What should I tell my health care provider before I take this medicine? They need to know if you have any of these conditions: -kidney disease -Leber's disease -megaloblastic anemia -an unusual or allergic reaction to cyanocobalamin, cobalt, other medicines, foods, dyes, or preservatives -pregnant or trying to get pregnant -breast-feeding How should I use this medicine? This medicine is injected into a muscle or deeply under the skin. It is usually given by a health care professional in a clinic or doctor's office. However, your doctor may teach you how to inject yourself. Follow all instructions. Talk to your pediatrician regarding the use of this medicine in children. Special care may be needed. Overdosage: If you think you have taken too much of this medicine contact a poison control center or emergency room at once. NOTE: This medicine is only for you. Do not share this medicine with others. What if I miss a dose? If you are given your dose at a clinic or doctor's office, call to reschedule your appointment. If you give your own injections and you miss a dose, take it as soon as you can. If it is almost time for your next dose, take only that dose. Do not take double or extra doses. What may interact with this medicine? -colchicine -heavy alcohol intake This list may not describe all possible  interactions. Give your health care provider a list of all the medicines, herbs, non-prescription drugs, or dietary supplements you use. Also tell them if you smoke, drink alcohol, or use illegal drugs. Some items may interact with your medicine. What should I watch for while using this medicine? Visit your doctor or health care professional regularly. You may need blood work done while you are taking this medicine. You may need to follow a special diet. Talk to your doctor. Limit your alcohol intake and avoid smoking to get the best benefit. What side effects may I notice from receiving this medicine? Side effects that you should report to your doctor or health care professional as soon as possible: -allergic reactions like skin rash, itching or hives, swelling of the face, lips, or tongue -blue tint to skin -chest tightness, pain -difficulty breathing, wheezing -dizziness -red, swollen painful area on the leg Side effects that usually do not require medical attention (report to your doctor or health care professional if they continue or are bothersome): -diarrhea -headache This list may not describe all possible side effects. Call your doctor for medical advice about side effects. You may report side effects to FDA at 1-800-FDA-1088. Where should I keep my medicine? Keep out of the reach of children. Store at room temperature between 15 and 30 degrees C (59 and 85 degrees F). Protect from light. Throw away any unused medicine after the expiration date. NOTE: This sheet is a summary. It may not cover all possible information. If you have questions about this medicine, talk to your doctor, pharmacist, or   health care provider.  2018 Elsevier/Gold Standard (2008-03-05 22:10:20)  

## 2018-10-13 ENCOUNTER — Other Ambulatory Visit: Payer: Self-pay | Admitting: Family Medicine

## 2018-10-13 DIAGNOSIS — I5189 Other ill-defined heart diseases: Secondary | ICD-10-CM

## 2018-10-19 NOTE — Progress Notes (Deleted)
NEUROLOGY FOLLOW UP OFFICE NOTE  Ebony Garcia [id number]  HISTORY OF PRESENT ILLNESS: Ebony Garcia is a 67 year old right-handed woman with hypertension, CHF, depression, hyperlipidemia and renal calculi who follows up for migraines.  UPDATE: Intensity:  *** Duration:  *** Frequency:  *** Frequency of abortive medication: *** Current NSAIDS:  *** Current analgesics:  *** Current triptans: Zomig 5 mg nasal spray Current ergotamine:  *** Current anti-emetic: Promethazine Current muscle relaxants:  *** Current anti-anxiolytic: Xanax Current sleep aide: Xanax Current Antihypertensive medications: Toprol-XL, Lasix Current Antidepressant medications: Prozac 20 mg Current Anticonvulsant medications: Topiramate 100 mg twice daily, gabapentin 300 mg at night Current anti-CGRP:  *** Current Vitamins/Herbal/Supplements:  *** Current Antihistamines/Decongestants:  *** Other therapy:  *** Other medication:  ***  Caffeine:  *** Alcohol:  *** Smoker:  *** Diet:  *** Exercise:  *** Depression:  ***; Anxiety:  *** Other pain:  *** Sleep hygiene:  ***  HISTORY:  Onset: Since late 30s Location: usually left sided, periorbital, and back of head. More recently, holocephalic Quality: Sharp, aching pressure. Non-throbbing Initial intensity: 7/10; Sept: 7/10 Associated symptoms: Nausea, photophobia and phonophobia. No visual disturbance, osmophobia or vomiting Aura: no Initial duration: 3 days; Sept: 30 minutes with Maxalt Initial frequency: 25 headache days per month; Sept: 25 headache days per month Triggers: Emotional stress Relieving factors: Cold mask, heating pack on neck and shoulders  Past NSAIDs:  naproxen (ineffective), indomethacin, toradol tablet (ineffective), Toradol 60mg  IM (effective), Cambia (side effects) Past analgesics:  Tylenol (ineffective), Excedrin (ineffective), Lidocaine nasal drops (effective but caused hives), tramadol (side effects),  Midrin (effective but made her sleepy) Past triptans:  sumatriptan 100mg , Zomig 5mg  (effective), Frova, Relpax, Maxalt (effective but unable to afford)  Past ergotamine:  DHE (effective),  Past antiemetic:  Reglan Past anxiolytics:  Vicodin Past muscle relaxants:  cyclobenzaprine Past antihypertensives:  propranolol (ineffective), atenolol (ineffective), lisinopril, losartan, HCTZ Past antidepressants:  amitriptyline (side effects), venlafaxine (ineffective), nortriptyline (effective but side effects such as rash) Past antiepileptics:  Depakote (hair loss), lamotrigine (side effects), zonisamide (side effects) Past CGRP inhibitor:  Aimovig (effective but concerned about side effects- ankle jerking, abnormal skin pattern). Past vitamins and supplements:  magnesium (ineffective), feverfew (ineffective) Other past therapy:  Botox (one round, flu like symptoms, unable to afford 20% copay), occipital nerve blocks, trigger point injections, alternating heat/ice  Family history of headache: Mother, grandmother, great-aunt, cousins.   08/16/08 MRI Brain w/wo performed for "explosive headaches": nonspecific punctate hyperintensities in the subcortical white matter. 08/16/08 MRA Head: Unremarkable. There is mild attenuation in the vertebrobasilar junction and proximal basilar artery which is likely artifact.  PAST MEDICAL HISTORY: Past Medical History:  Diagnosis Date  . Cancer ([place])   . CHF (congestive heart failure) ([place])    dystolic heart dysfunction  . Chronic kidney disease   . Depression   . Depression with anxiety   . Diabetes mellitus without complication ([place])    Diet controlled  . Hypertension   . Intertrigo   . Migraines   . Panic attacks     MEDICATIONS: Current Outpatient Medications on File Prior to Visit  Medication Sig Dispense Refill  . ALPRAZolam (XANAX) 1 MG tablet Take 1 tablet (1 mg total) by mouth at bedtime as needed. for sleep (Patient taking differently: Take  1 mg by mouth at bedtime. for sleep) 30 tablet 2  . estradiol (ESTRACE) 0.5 MG tablet Take 0.5 mg by mouth daily. 1/2 tab daily    . FLUoxetine (PROZAC) 20 MG capsule  Take 1 capsule (20 mg total) by mouth daily. 90 capsule 3  . furosemide (LASIX) 20 MG tablet TAKE 1 TABLET BY MOUTH ONCE DAILY 90 tablet 1  . metoprolol succinate (TOPROL-XL) 50 MG 24 hr tablet TAKE 1 TABLET BY MOUTH ONCE DAILY WITH  OR  IMMEDIATELY  FOLLOWING  A  MEAL 90 tablet 0  . nitroGLYCERIN (NITROSTAT) 0.4 MG SL tablet Place 1 tablet (0.4 mg total) under the tongue every 5 (five) minutes as needed for chest pain. 25 tablet 3  . predniSONE (STERAPRED UNI-PAK 21 TAB) 10 MG (21) TBPK tablet As directed 21 tablet 0  . promethazine (PHENERGAN) 25 MG tablet TAKE ONE TABLET BY MOUTH EVERY 6 HOURS AS NEEDED FOR NAUSEA AND VOMITING 30 tablet 2  . rizatriptan (MAXALT) 10 MG tablet TAKE 1 TABLET EARLIEST ONSET OF HEADACHE. MAY REPEAT ONCE IN 2 HOURS IF NEEDED 10 tablet 1  . topiramate (TOPAMAX) 100 MG tablet TAKE 1 TABLET BY MOUTH TWICE DAILY 60 tablet 5  . topiramate (TOPAMAX) 100 MG tablet TAKE 1 TABLET BY MOUTH TWICE DAILY 180 tablet 1  . triamcinolone ointment (KENALOG) 0.1 % Apply topically as needed    . zolmitriptan (ZOMIG) 5 MG nasal solution Place 1 spray into the nose as needed for migraine. 3 Units 0   No current facility-administered medications on file prior to visit.     ALLERGIES: Allergies  Allergen Reactions  . Amoxicillin-Pot Clavulanate Diarrhea  . Antihistamines, Diphenhydramine-Type Other (See Comments)    Other Reaction: makes pt nervous  . Diphenhydramine Hcl Other (See Comments)    Other Reaction: makes pt nervous  . Amoxicillin-Pot Clavulanate Diarrhea  . Clindamycin     She has not taken but per patient  "her mother had a reaction and she does not want it ever"  . Hydrocodone-Acetaminophen     REACTION: sensitive to  but can take for extreme pain per patient  . Nortriptyline   . Propoxyphene Hcl  Nausea Only  . Propoxyphene N-Acetaminophen Nausea Only  . Sulfa Antibiotics     Unknown   . Tramadol Other (See Comments)    Pt does know what kind of effect  . Fremanezumab-Vfrm Rash and Other (See Comments)    Seizure, jerking  . Lisinopril     Restless , irregular sleep    FAMILY HISTORY: Family History  Problem Relation Age of Onset  . Crohn's disease Mother   . Atrial fibrillation Mother   . Heart failure Mother   . Bladder Cancer Father   . Cancer Father        bladder  . Bipolar disorder Brother   . Cancer Brother   . Bipolar disorder Sister   . Heart attack Maternal Grandmother        >65  . Cancer Maternal Aunt   . Colon cancer Maternal Uncle   . Cancer Paternal Grandfather   . Cancer Maternal Aunt   . Cancer Cousin   . Anemia Cousin   . Diabetes Neg Hx   . Stroke Neg Hx    SOCIAL HISTORY: Social History   Socioeconomic History  . Marital status: Divorced    Spouse name: Not on file  . Number of children: 3  . Years of education: Not on file  . Highest education level: Not on file  Occupational History    Employer: UNEMPLOYED  Social Needs  . Financial resource strain: Not on file  . Food insecurity:    Worry: Not on file  Inability: Not on file  . Transportation needs:    Medical: Not on file    Non-medical: Not on file  Tobacco Use  . Smoking status: Never Smoker  . Smokeless tobacco: Never Used  Substance and Sexual Activity  . Alcohol use: No    Alcohol/week: 0.0 standard drinks  . Drug use: No  . Sexual activity: Not Currently    Partners: Male  Lifestyle  . Physical activity:    Days per week: Not on file    Minutes per session: Not on file  . Stress: Not on file  Relationships  . Social connections:    Talks on phone: Not on file    Gets together: Not on file    Attends religious service: Not on file    Active member of club or organization: Not on file    Attends meetings of clubs or organizations: Not on file     Relationship status: Not on file  . Intimate partner violence:    Fear of current or ex partner: Not on file    Emotionally abused: Not on file    Physically abused: Not on file    Forced sexual activity: Not on file  Other Topics Concern  . Not on file  Social History Narrative   Lives alone.            REVIEW OF SYSTEMS: Constitutional: No fevers, chills, or sweats, no generalized fatigue, change in appetite Eyes: No visual changes, double vision, eye pain Ear, nose and throat: No hearing loss, ear pain, nasal congestion, sore throat Cardiovascular: No chest pain, palpitations Respiratory:  No shortness of breath at rest or with exertion, wheezes GastrointestinaI: No nausea, vomiting, diarrhea, abdominal pain, fecal incontinence Genitourinary:  No dysuria, urinary retention or frequency Musculoskeletal:  No neck pain, back pain Integumentary: No rash, pruritus, skin lesions Neurological: as above Psychiatric: No depression, insomnia, anxiety Endocrine: No palpitations, fatigue, diaphoresis, mood swings, change in appetite, change in weight, increased thirst Hematologic/Lymphatic:  No purpura, petechiae. Allergic/Immunologic: no itchy/runny eyes, nasal congestion, recent allergic reactions, rashes  PHYSICAL EXAM: *** General: No acute distress.  Patient appears ***-groomed.  *** body habitus. Head:  Normocephalic/atraumatic Eyes:  Fundi examined but not visualized Neck: supple, no paraspinal tenderness, full range of motion Heart:  Regular rate and rhythm Lungs:  Clear to auscultation bilaterally Back: No paraspinal tenderness Neurological Exam: alert and oriented to person, place, and time. Attention span and concentration intact, recent and remote memory intact, fund of knowledge intact.  Speech fluent and not dysarthric, language intact.  CN II-XII intact. Bulk and tone normal, muscle strength 5/5 throughout.  Sensation to light touch, temperature and vibration intact.   Deep tendon reflexes 2+ throughout, toes downgoing.  Finger to nose and heel to shin testing intact.  Gait normal, Romberg negative.  IMPRESSION: ***  PLAN: ***  Metta Clines, DO  CC: ***

## 2018-10-21 ENCOUNTER — Ambulatory Visit: Payer: Medicare Other | Admitting: Neurology

## 2018-10-25 ENCOUNTER — Other Ambulatory Visit: Payer: Self-pay | Admitting: Family Medicine

## 2018-10-25 DIAGNOSIS — R002 Palpitations: Secondary | ICD-10-CM

## 2018-10-25 DIAGNOSIS — I10 Essential (primary) hypertension: Secondary | ICD-10-CM

## 2018-10-26 ENCOUNTER — Telehealth: Payer: Self-pay

## 2018-10-26 NOTE — Telephone Encounter (Signed)
Pt called the office complaining of a 2 day headache. She states she is unable to afford Zomig and was asking about coming in for a toradol injection. I advised her I do have samples of Zomig if she would like. Pt will p/u samples  LOT: [id number] EXP: 09-2019

## 2018-11-16 ENCOUNTER — Inpatient Hospital Stay: Payer: Medicare Other

## 2018-11-16 ENCOUNTER — Inpatient Hospital Stay: Payer: Medicare Other | Attending: Hematology & Oncology

## 2018-11-16 VITALS — BP 125/79 | HR 54 | Temp 98.0°F | Resp 16

## 2018-11-16 DIAGNOSIS — E538 Deficiency of other specified B group vitamins: Secondary | ICD-10-CM

## 2018-11-16 DIAGNOSIS — T50905S Adverse effect of unspecified drugs, medicaments and biological substances, sequela: Secondary | ICD-10-CM | POA: Diagnosis not present

## 2018-11-16 DIAGNOSIS — D6959 Other secondary thrombocytopenia: Secondary | ICD-10-CM | POA: Diagnosis not present

## 2018-11-16 LAB — VITAMIN B12: Vitamin B-12: 192 pg/mL (ref 180–914)

## 2018-11-16 MED ORDER — CYANOCOBALAMIN 1000 MCG/ML IJ SOLN
INTRAMUSCULAR | Status: AC
  Filled 2018-11-16: qty 1

## 2018-11-16 MED ORDER — CYANOCOBALAMIN 1000 MCG/ML IJ SOLN
1000.0000 ug | Freq: Once | INTRAMUSCULAR | Status: AC
  Administered 2018-11-16: 1000 ug via INTRAMUSCULAR

## 2018-11-16 NOTE — Patient Instructions (Signed)
Cyanocobalamin, Vitamin B12 injection What is this medicine? CYANOCOBALAMIN (sye an oh koe BAL a min) is a man made form of vitamin B12. Vitamin B12 is used in the growth of healthy blood cells, nerve cells, and proteins in the body. It also helps with the metabolism of fats and carbohydrates. This medicine is used to treat people who can not absorb vitamin B12. This medicine may be used for other purposes; ask your health care provider or pharmacist if you have questions. COMMON BRAND NAME(S): B-12 Compliance Kit, B-12 Injection Kit, Cyomin, LA-12, Nutri-Twelve, Physicians EZ Use B-12, Primabalt What should I tell my health care provider before I take this medicine? They need to know if you have any of these conditions: -kidney disease -Leber's disease -megaloblastic anemia -an unusual or allergic reaction to cyanocobalamin, cobalt, other medicines, foods, dyes, or preservatives -pregnant or trying to get pregnant -breast-feeding How should I use this medicine? This medicine is injected into a muscle or deeply under the skin. It is usually given by a health care professional in a clinic or doctor's office. However, your doctor may teach you how to inject yourself. Follow all instructions. Talk to your pediatrician regarding the use of this medicine in children. Special care may be needed. Overdosage: If you think you have taken too much of this medicine contact a poison control center or emergency room at once. NOTE: This medicine is only for you. Do not share this medicine with others. What if I miss a dose? If you are given your dose at a clinic or doctor's office, call to reschedule your appointment. If you give your own injections and you miss a dose, take it as soon as you can. If it is almost time for your next dose, take only that dose. Do not take double or extra doses. What may interact with this medicine? -colchicine -heavy alcohol intake This list may not describe all possible  interactions. Give your health care provider a list of all the medicines, herbs, non-prescription drugs, or dietary supplements you use. Also tell them if you smoke, drink alcohol, or use illegal drugs. Some items may interact with your medicine. What should I watch for while using this medicine? Visit your doctor or health care professional regularly. You may need blood work done while you are taking this medicine. You may need to follow a special diet. Talk to your doctor. Limit your alcohol intake and avoid smoking to get the best benefit. What side effects may I notice from receiving this medicine? Side effects that you should report to your doctor or health care professional as soon as possible: -allergic reactions like skin rash, itching or hives, swelling of the face, lips, or tongue -blue tint to skin -chest tightness, pain -difficulty breathing, wheezing -dizziness -red, swollen painful area on the leg Side effects that usually do not require medical attention (report to your doctor or health care professional if they continue or are bothersome): -diarrhea -headache This list may not describe all possible side effects. Call your doctor for medical advice about side effects. You may report side effects to FDA at 1-800-FDA-1088. Where should I keep my medicine? Keep out of the reach of children. Store at room temperature between 15 and 30 degrees C (59 and 85 degrees F). Protect from light. Throw away any unused medicine after the expiration date. NOTE: This sheet is a summary. It may not cover all possible information. If you have questions about this medicine, talk to your doctor, pharmacist, or   health care provider.  2018 Elsevier/Gold Standard (2008-03-05 22:10:20)  

## 2018-11-24 ENCOUNTER — Other Ambulatory Visit: Payer: Self-pay | Admitting: Family Medicine

## 2018-11-24 DIAGNOSIS — F419 Anxiety disorder, unspecified: Secondary | ICD-10-CM

## 2018-11-28 NOTE — Telephone Encounter (Signed)
Refill request for alprazolam.   Last OV: 08/02/2018 Last Fill: 08/02/2018 #30 and 2RF UDS: 11/24/2017 Low risk

## 2018-12-13 ENCOUNTER — Telehealth: Payer: Self-pay | Admitting: Neurology

## 2018-12-13 ENCOUNTER — Other Ambulatory Visit: Payer: Self-pay | Admitting: *Deleted

## 2018-12-13 MED ORDER — ZOLMITRIPTAN 5 MG NA SOLN: 5.0000 mg | NASAL | 5 refills | Status: DC | PRN

## 2018-12-13 NOTE — Telephone Encounter (Signed)
Patient's mother called needing to have a Zomig Sample Nose Spray called into [facility] on [address]. Her mother said she was too sick to call in. Thanks

## 2018-12-13 NOTE — Telephone Encounter (Signed)
Rx sent in

## 2018-12-14 ENCOUNTER — Telehealth: Payer: Self-pay | Admitting: Neurology

## 2018-12-14 MED ORDER — SUMATRIPTAN 10 MG/ACT NA SOLN: 1.0000 | NASAL | 0 refills | Status: DC

## 2018-12-14 NOTE — Telephone Encounter (Signed)
Called and spoke with Pt. She has tried a cup of "stout" coffee and is feeling much better. She does not feel like she will need an injection today. She would like a few samples of Zomig n/s for if this happens again. She is unable to afford the medication. I advised her we unfortunately do not have any samples of Zomig at this time. She asked if there were any other type of n/s samples we could provide her with in the event she develops a severe headache again. I advised her to call her Rx coverage and find out what triptans are covered more affordably on her formulary and let us know. 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

## 2018-12-14 NOTE — Addendum Note (Signed)
Addended by: Clois Comber on: 12/14/2018 01:08 PM   Modules accepted: Orders

## 2018-12-14 NOTE — Telephone Encounter (Signed)
Can give the Tosymra samples for now. Thanks

## 2018-12-14 NOTE — Telephone Encounter (Signed)
Called and advised Pt 

## 2018-12-14 NOTE — Telephone Encounter (Signed)
patient states that the Zomig is 461.00 with her ins. She states that she could not pay that for the medication and wants to know if she can come in and get a shot for her migraine. She will have a driver please call

## 2018-12-15 ENCOUNTER — Other Ambulatory Visit: Payer: Self-pay | Admitting: *Deleted

## 2018-12-15 DIAGNOSIS — E785 Hyperlipidemia, unspecified: Secondary | ICD-10-CM

## 2018-12-15 DIAGNOSIS — D696 Thrombocytopenia, unspecified: Secondary | ICD-10-CM

## 2018-12-16 ENCOUNTER — Inpatient Hospital Stay: Payer: Medicare Other

## 2018-12-16 ENCOUNTER — Inpatient Hospital Stay: Payer: Medicare Other | Attending: Hematology & Oncology

## 2018-12-16 ENCOUNTER — Other Ambulatory Visit: Payer: Self-pay

## 2018-12-16 VITALS — BP 128/80 | HR 48 | Temp 98.3°F | Resp 18

## 2018-12-16 DIAGNOSIS — T50905S Adverse effect of unspecified drugs, medicaments and biological substances, sequela: Secondary | ICD-10-CM | POA: Diagnosis not present

## 2018-12-16 DIAGNOSIS — E538 Deficiency of other specified B group vitamins: Secondary | ICD-10-CM

## 2018-12-16 DIAGNOSIS — E785 Hyperlipidemia, unspecified: Secondary | ICD-10-CM

## 2018-12-16 DIAGNOSIS — D696 Thrombocytopenia, unspecified: Secondary | ICD-10-CM

## 2018-12-16 DIAGNOSIS — D6959 Other secondary thrombocytopenia: Secondary | ICD-10-CM | POA: Diagnosis not present

## 2018-12-16 LAB — CBC WITH DIFFERENTIAL (CANCER CENTER ONLY)
Abs Immature Granulocytes: 0.03 10*3/uL (ref 0.00–0.07)
Basophils Absolute: 0.1 10*3/uL (ref 0.0–0.1)
Basophils Relative: 1 %
Eosinophils Absolute: 0.2 10*3/uL (ref 0.0–0.5)
Eosinophils Relative: 3 %
HCT: 44.7 % (ref 36.0–46.0)
Hemoglobin: 13.9 g/dL (ref 12.0–15.0)
Immature Granulocytes: 1 %
Lymphocytes Relative: 26 %
Lymphs Abs: 1.7 10*3/uL (ref 0.7–4.0)
MCH: 30.1 pg (ref 26.0–34.0)
MCHC: 31.1 g/dL (ref 30.0–36.0)
MCV: 96.8 fL (ref 80.0–100.0)
Monocytes Absolute: 0.4 10*3/uL (ref 0.1–1.0)
Monocytes Relative: 7 %
Neutro Abs: 4 10*3/uL (ref 1.7–7.7)
Neutrophils Relative %: 62 %
Platelet Count: 75 10*3/uL — ABNORMAL LOW (ref 150–400)
RBC: 4.62 MIL/uL (ref 3.87–5.11)
RDW: 13 % (ref 11.5–15.5)
WBC Count: 6.4 10*3/uL (ref 4.0–10.5)
nRBC: 0 % (ref 0.0–0.2)

## 2018-12-16 LAB — CMP (CANCER CENTER ONLY)
ALT: 11 U/L (ref 0–44)
AST: 13 U/L — ABNORMAL LOW (ref 15–41)
Albumin: 4.3 g/dL (ref 3.5–5.0)
Alkaline Phosphatase: 76 U/L (ref 38–126)
Anion gap: 7 (ref 5–15)
BUN: 12 mg/dL (ref 8–23)
CO2: 25 mmol/L (ref 22–32)
Calcium: 8.7 mg/dL — ABNORMAL LOW (ref 8.9–10.3)
Chloride: 107 mmol/L (ref 98–111)
Creatinine: 1.09 mg/dL — ABNORMAL HIGH (ref 0.44–1.00)
GFR, Est AFR Am: >60 mL/min (ref >60)
GFR, Estimated: 53 mL/min — ABNORMAL LOW (ref >60)
Glucose, Bld: 131 mg/dL — ABNORMAL HIGH (ref 70–99)
Potassium: 3.9 mmol/L (ref 3.5–5.1)
Sodium: 139 mmol/L (ref 135–145)
Total Bilirubin: 0.3 mg/dL (ref 0.3–1.2)
Total Protein: 6.8 g/dL (ref 6.5–8.1)

## 2018-12-16 MED ORDER — CYANOCOBALAMIN 1000 MCG/ML IJ SOLN
INTRAMUSCULAR | Status: AC
  Filled 2018-12-16: qty 1

## 2018-12-16 MED ORDER — CYANOCOBALAMIN 1000 MCG/ML IJ SOLN
1000.0000 ug | Freq: Once | INTRAMUSCULAR | Status: AC
  Administered 2018-12-16: 1000 ug via INTRAMUSCULAR

## 2018-12-16 NOTE — Patient Instructions (Signed)
Cyanocobalamin, Vitamin B12 injection What is this medicine? CYANOCOBALAMIN (sye an oh koe BAL a min) is a man made form of vitamin B12. Vitamin B12 is used in the growth of healthy blood cells, nerve cells, and proteins in the body. It also helps with the metabolism of fats and carbohydrates. This medicine is used to treat people who can not absorb vitamin B12. This medicine may be used for other purposes; ask your health care provider or pharmacist if you have questions. COMMON BRAND NAME(S): B-12 Compliance Kit, B-12 Injection Kit, Cyomin, LA-12, Nutri-Twelve, Physicians EZ Use B-12, Primabalt What should I tell my health care provider before I take this medicine? They need to know if you have any of these conditions: -kidney disease -Leber's disease -megaloblastic anemia -an unusual or allergic reaction to cyanocobalamin, cobalt, other medicines, foods, dyes, or preservatives -pregnant or trying to get pregnant -breast-feeding How should I use this medicine? This medicine is injected into a muscle or deeply under the skin. It is usually given by a health care professional in a clinic or doctor's office. However, your doctor may teach you how to inject yourself. Follow all instructions. Talk to your pediatrician regarding the use of this medicine in children. Special care may be needed. Overdosage: If you think you have taken too much of this medicine contact a poison control center or emergency room at once. NOTE: This medicine is only for you. Do not share this medicine with others. What if I miss a dose? If you are given your dose at a clinic or doctor's office, call to reschedule your appointment. If you give your own injections and you miss a dose, take it as soon as you can. If it is almost time for your next dose, take only that dose. Do not take double or extra doses. What may interact with this medicine? -colchicine -heavy alcohol intake This list may not describe all possible  interactions. Give your health care provider a list of all the medicines, herbs, non-prescription drugs, or dietary supplements you use. Also tell them if you smoke, drink alcohol, or use illegal drugs. Some items may interact with your medicine. What should I watch for while using this medicine? Visit your doctor or health care professional regularly. You may need blood work done while you are taking this medicine. You may need to follow a special diet. Talk to your doctor. Limit your alcohol intake and avoid smoking to get the best benefit. What side effects may I notice from receiving this medicine? Side effects that you should report to your doctor or health care professional as soon as possible: -allergic reactions like skin rash, itching or hives, swelling of the face, lips, or tongue -blue tint to skin -chest tightness, pain -difficulty breathing, wheezing -dizziness -red, swollen painful area on the leg Side effects that usually do not require medical attention (report to your doctor or health care professional if they continue or are bothersome): -diarrhea -headache This list may not describe all possible side effects. Call your doctor for medical advice about side effects. You may report side effects to FDA at 1-800-FDA-1088. Where should I keep my medicine? Keep out of the reach of children. Store at room temperature between 15 and 30 degrees C (59 and 85 degrees F). Protect from light. Throw away any unused medicine after the expiration date. NOTE: This sheet is a summary. It may not cover all possible information. If you have questions about this medicine, talk to your doctor, pharmacist, or   health care provider.  2019 Elsevier/Gold Standard (2008-03-05 22:10:20)  

## 2018-12-20 ENCOUNTER — Other Ambulatory Visit: Payer: Self-pay | Admitting: Family Medicine

## 2018-12-20 DIAGNOSIS — F419 Anxiety disorder, unspecified: Secondary | ICD-10-CM

## 2018-12-23 NOTE — Telephone Encounter (Signed)
Refill Request: Alprazolam   Last RX:11/28/18 Last OV:08/02/18 Next OV:02/02/18 UDS:12/25/16 CSC:11/24/17 CSR:

## 2019-01-07 ENCOUNTER — Encounter: Payer: Self-pay | Admitting: Family

## 2019-01-09 ENCOUNTER — Telehealth: Payer: Self-pay | Admitting: Family

## 2019-01-09 NOTE — Telephone Encounter (Signed)
Led and spoke with patient regarding adding appointments to her schedule per 2/3 sch msg

## 2019-01-09 NOTE — Progress Notes (Signed)
NEUROLOGY FOLLOW UP OFFICE NOTE  Ebony Garcia [id number]  HISTORY OF PRESENT ILLNESS: Ebony Garcia is a 68 year old right-handed woman with hypertension, CHF, renal calculi, depression and hyperlipidemia who follows up for migraines.  UPDATE: Intensity:   Duration:  6 to 72 hours without treatment.  15 minutes with Zomig or Tosymra Frequency:  Daily to every other day.  Sometimes she will be headache-free for 5 to 7 days. Current NSAIDS:  no Current analgesics:  no. Current triptans:   Zomig nasal spray (effective but can't get with her insurance because there is no co-pay card), Tosymra (effective) Current anti-emetic:  Phenergan Current muscle relaxants:  no Current anti-anxiolytic:  Xanax 1mg  Current sleep aide:  Xanax 1mg  Current Antihypertensive medications:  Toprol XL 50mg , Lasix Current Antidepressant medications:  Prozac 20mg  Current Anticonvulsant medications:  topiramate 100mg  BID  Depression and anxiety:  Yes  HISTORY:  Onset: Since her late 56s Location: usually left sided, periorbital, and back of head. More recently, holocephalic Quality: Sharp, aching pressure. Non-throbbing Initial intensity: 7/10; Sept: 7/10 Associated symptoms: Nausea, photophobia and phonophobia. No visual disturbance, osmophobia, vomiting or unilateral numbness or weakness Aura: no Initial duration: 3 days; Sept: 30 minutes with Maxalt Initial frequency: 25 headache days per month; Sept: 25 headache days per month Triggers: Emotional stress Relieving factors: Cold mask, heating pack on neck and shoulders  Past NSAIDs:  naproxen (ineffective), indomethacin, toradol tablet (ineffective), Toradol 60mg  IM (effective), Cambia (side effects) Past analgesics:  Tylenol (ineffective), Excedrin (ineffective), Lidocaine nasal drops (effective but caused hives), tramadol (side effects), Midrin (effective but made her sleepy) Past triptans/ergots:  sumatriptan 100mg , Zomig 5mg   (effective), Frova, Relpax, DHE (effective), Maxalt (effective but unable to afford it) Past antiemetic:  Reglan Past anxiolytics:  Vicodin Past muscle relaxants:  cyclobenzaprine Past antihypertensives:  propranolol (ineffective), atenolol (ineffective), lisinopril, losartan, HCTZ Past antidepressants:  amitriptyline (side effects), venlafaxine (ineffective), nortriptyline (effective but side effects such as rash), imipramine Past antiepileptics:  Depakote (hair loss), lamotrigine (side effects), zonisamide (side effects), gabapentin, Keppra Past CGRP inhibitor:  Aimovig (effective but concerned about side effects- ankle jerking, abnormal skin pattern), Ajovy (same side effects) Past vitamins and supplements:  magnesium (ineffective), feverfew (ineffective) Other past therapy:  Botox (one round, flu like symptoms, unable to afford 20% copay), occipital nerve blocks, trigger point injections, alternating heat/ice  Family history of headache: Mother, grandmother, great-aunt, cousins.  08/16/08 MRI Brain w/wo performed for "explosive headaches": nonspecific punctate hyperintensities in the subcortical white matter. 08/16/08 MRA Head: Unremarkable. There is mild attenuation in the vertebrobasilar junction and proximal basilar artery which is likely artifact.  PAST MEDICAL HISTORY: Past Medical History:  Diagnosis Date  . Cancer ([place])   . CHF (congestive heart failure) ([place])    dystolic heart dysfunction  . Chronic kidney disease   . Depression   . Depression with anxiety   . Diabetes mellitus without complication ([place])    Diet controlled  . Hypertension   . Intertrigo   . Migraines   . Panic attacks     MEDICATIONS: Current Outpatient Medications on File Prior to Visit  Medication Sig Dispense Refill  . ALPRAZolam (XANAX) 1 MG tablet TAKE 1 TABLET BY MOUTH AT BEDTIME AS NEEDED FOR SLEEP 30 tablet 0  . estradiol (ESTRACE) 0.5 MG tablet Take 0.5 mg by mouth daily. 1/2 tab daily      . FLUoxetine (PROZAC) 20 MG capsule Take 1 capsule (20 mg total) by mouth daily. 90 capsule 3  . furosemide (LASIX)  20 MG tablet TAKE 1 TABLET BY MOUTH ONCE DAILY 90 tablet 1  . metoprolol succinate (TOPROL-XL) 50 MG 24 hr tablet TAKE 1 TABLET BY MOUTH ONCE DAILY WITH  OR  IMMEDIATELY  FOLLOWING  A  MEAL 90 tablet 1  . nitroGLYCERIN (NITROSTAT) 0.4 MG SL tablet Place 1 tablet (0.4 mg total) under the tongue every 5 (five) minutes as needed for chest pain. 25 tablet 3  . predniSONE (STERAPRED UNI-PAK 21 TAB) 10 MG (21) TBPK tablet As directed 21 tablet 0  . promethazine (PHENERGAN) 25 MG tablet TAKE ONE TABLET BY MOUTH EVERY 6 HOURS AS NEEDED FOR NAUSEA AND VOMITING 30 tablet 2  . rizatriptan (MAXALT) 10 MG tablet TAKE 1 TABLET EARLIEST ONSET OF HEADACHE. MAY REPEAT ONCE IN 2 HOURS IF NEEDED 10 tablet 1  . SUMAtriptan (TOSYMRA) 10 MG/ACT SOLN Place 1 spray into the nose as directed. 3 each 0  . topiramate (TOPAMAX) 100 MG tablet TAKE 1 TABLET BY MOUTH TWICE DAILY 60 tablet 5  . topiramate (TOPAMAX) 100 MG tablet TAKE 1 TABLET BY MOUTH TWICE DAILY 180 tablet 1  . triamcinolone ointment (KENALOG) 0.1 % Apply topically as needed    . zolmitriptan (ZOMIG) 5 MG nasal solution Place 1 spray into the nose as needed for migraine. 3 Units 5   No current facility-administered medications on file prior to visit.     ALLERGIES: Allergies  Allergen Reactions  . Amoxicillin-Pot Clavulanate Diarrhea  . Antihistamines, Diphenhydramine-Type Other (See Comments)    Other Reaction: makes pt nervous  . Diphenhydramine Hcl Other (See Comments)    Other Reaction: makes pt nervous  . Amoxicillin-Pot Clavulanate Diarrhea  . Clindamycin     She has not taken but per patient  "her mother had a reaction and she does not want it ever"  . Hydrocodone-Acetaminophen     REACTION: sensitive to  but can take for extreme pain per patient  . Nortriptyline   . Propoxyphene Hcl Nausea Only  . Propoxyphene N-Acetaminophen  Nausea Only  . Sulfa Antibiotics     Unknown   . Tramadol Other (See Comments)    Pt does know what kind of effect  . Fremanezumab-Vfrm Rash and Other (See Comments)    Seizure, jerking  . Lisinopril     Restless , irregular sleep    FAMILY HISTORY: Family History  Problem Relation Age of Onset  . Crohn's disease Mother   . Atrial fibrillation Mother   . Heart failure Mother   . Bladder Cancer Father   . Cancer Father        bladder  . Bipolar disorder Brother   . Cancer Brother   . Bipolar disorder Sister   . Heart attack Maternal Grandmother        >65  . Cancer Maternal Aunt   . Colon cancer Maternal Uncle   . Cancer Paternal Grandfather   . Cancer Maternal Aunt   . Cancer Cousin   . Anemia Cousin   . Diabetes Neg Hx   . Stroke Neg Hx    SOCIAL HISTORY: Social History   Socioeconomic History  . Marital status: Divorced    Spouse name: Not on file  . Number of children: 3  . Years of education: Not on file  . Highest education level: Not on file  Occupational History    Employer: UNEMPLOYED  Social Needs  . Financial resource strain: Not on file  . Food insecurity:    Worry: Not on file  Inability: Not on file  . Transportation needs:    Medical: Not on file    Non-medical: Not on file  Tobacco Use  . Smoking status: Never Smoker  . Smokeless tobacco: Never Used  Substance and Sexual Activity  . Alcohol use: No    Alcohol/week: 0.0 standard drinks  . Drug use: No  . Sexual activity: Not Currently    Partners: Male  Lifestyle  . Physical activity:    Days per week: Not on file    Minutes per session: Not on file  . Stress: Not on file  Relationships  . Social connections:    Talks on phone: Not on file    Gets together: Not on file    Attends religious service: Not on file    Active member of club or organization: Not on file    Attends meetings of clubs or organizations: Not on file    Relationship status: Not on file  . Intimate partner  violence:    Fear of current or ex partner: Not on file    Emotionally abused: Not on file    Physically abused: Not on file    Forced sexual activity: Not on file  Other Topics Concern  . Not on file  Social History Narrative   Lives alone.            REVIEW OF SYSTEMS: Constitutional: No fevers, chills, or sweats, no generalized fatigue, change in appetite Eyes: No visual changes, double vision, eye pain Ear, nose and throat: No hearing loss, ear pain, nasal congestion, sore throat Cardiovascular: No chest pain, palpitations Respiratory:  No shortness of breath at rest or with exertion, wheezes GastrointestinaI: No nausea, vomiting, diarrhea, abdominal pain, fecal incontinence Genitourinary:  No dysuria, urinary retention or frequency Musculoskeletal:  No neck pain, back pain Integumentary: No rash, pruritus, skin lesions Neurological: as above Psychiatric: depression, anxiety Endocrine: No palpitations, fatigue, diaphoresis, mood swings, change in appetite, change in weight, increased thirst Hematologic/Lymphatic:  No purpura, petechiae. Allergic/Immunologic: no itchy/runny eyes, nasal congestion, recent allergic reactions, rashes  PHYSICAL EXAM: Blood pressure 118/80, pulse 60, height 5\' 2"  (1.575 m), weight 167 lb (75.8 kg), SpO2 97 %. General: No acute distress.  Patient appears well-groomed.   Head:  Normocephalic/atraumatic Eyes:  Fundi examined but not visualized Neck: supple, no paraspinal tenderness, full range of motion Heart:  Regular rate and rhythm Lungs:  Clear to auscultation bilaterally Back: No paraspinal tenderness Neurological Exam: alert and oriented to person, place, and time. Attention span and concentration intact, recent and remote memory intact, fund of knowledge intact.  Speech fluent and not dysarthric, language intact.  CN II-XII intact. Bulk and tone normal, muscle strength 5/5 throughout.  Sensation to light touch  intact.  Deep tendon reflexes 2+  throughout.  Finger to nose testing intact.  Gait normal  IMPRESSION: Chronic migraine without aura, without status migrainosus, not intractable  PLAN: 1.  For preventative management, will try Emgality 2.  For abortive therapy, will continue Tosymra 3.  Limit use of pain relievers to no more than 2 days out of week to prevent risk of rebound or medication-overuse headache. 4.  Keep headache diary 5.  Exercise, hydration, caffeine cessation, sleep hygiene, monitor for and avoid triggers 6.  Consider:  magnesium citrate 400mg  daily, riboflavin 400mg  daily, and coenzyme Q10 100mg  three times daily 7.  Follow up in 5 months   Metta Clines, DO  CC: Roma Schanz, DO

## 2019-01-11 ENCOUNTER — Ambulatory Visit (INDEPENDENT_AMBULATORY_CARE_PROVIDER_SITE_OTHER): Payer: Medicare Other | Admitting: Neurology

## 2019-01-11 ENCOUNTER — Encounter: Payer: Self-pay | Admitting: Neurology

## 2019-01-11 VITALS — BP 118/80 | HR 60 | Ht 62.0 in | Wt 167.0 lb

## 2019-01-11 DIAGNOSIS — G43709 Chronic migraine without aura, not intractable, without status migrainosus: Secondary | ICD-10-CM | POA: Diagnosis not present

## 2019-01-11 MED ORDER — SUMATRIPTAN 10 MG/ACT NA SOLN: 10.0000 mg | NASAL | 3 refills | Status: DC | PRN

## 2019-01-11 MED ORDER — GALCANEZUMAB-GNLM 120 MG/ML ~~LOC~~ SOSY: 120.0000 mg | PREFILLED_SYRINGE | Freq: Once | SUBCUTANEOUS | 0 refills | Status: AC

## 2019-01-11 MED ORDER — GALCANEZUMAB-GNLM 120 MG/ML ~~LOC~~ SOSY: 120.0000 mg | PREFILLED_SYRINGE | SUBCUTANEOUS | 11 refills | Status: DC

## 2019-01-11 NOTE — Patient Instructions (Addendum)
1.  We will see if Emgality is an option 2.  When you get a migraine, try the Tosymra sumatriptan spray:  1 spray in one nostril.  May repeat after every 1 hour as needed, maximum 3 sprays in 24 hours 3.  Limit use of pain relievers to no more than 2 days out of week to prevent risk of rebound or medication-overuse headache. 4.  Keep headache diary 5.  Follow up in 5 months

## 2019-01-16 ENCOUNTER — Telehealth: Payer: Self-pay | Admitting: Neurology

## 2019-01-16 ENCOUNTER — Inpatient Hospital Stay: Payer: Medicare Other | Attending: Hematology & Oncology

## 2019-01-16 VITALS — BP 131/89 | HR 58 | Temp 97.8°F | Resp 17

## 2019-01-16 DIAGNOSIS — D6959 Other secondary thrombocytopenia: Secondary | ICD-10-CM | POA: Insufficient documentation

## 2019-01-16 DIAGNOSIS — E538 Deficiency of other specified B group vitamins: Secondary | ICD-10-CM

## 2019-01-16 MED ORDER — CYANOCOBALAMIN 1000 MCG/ML IJ SOLN
1000.0000 ug | Freq: Once | INTRAMUSCULAR | Status: AC
  Administered 2019-01-16: 1000 ug via INTRAMUSCULAR

## 2019-01-16 NOTE — Telephone Encounter (Signed)
Patient called needing to speak with you regarding her Emgality. She said it has worked great for her but cost too much. She also said that her Insurance (Medicare Part D) would not accept the 0 Copay card? Please Call. Thanks

## 2019-01-16 NOTE — Patient Instructions (Signed)
Cyanocobalamin, Vitamin B12 injection What is this medicine? CYANOCOBALAMIN (sye an oh koe BAL a min) is a man made form of vitamin B12. Vitamin B12 is used in the growth of healthy blood cells, nerve cells, and proteins in the body. It also helps with the metabolism of fats and carbohydrates. This medicine is used to treat people who can not absorb vitamin B12. This medicine may be used for other purposes; ask your health care provider or pharmacist if you have questions. COMMON BRAND NAME(S): B-12 Compliance Kit, B-12 Injection Kit, Cyomin, LA-12, Nutri-Twelve, Physicians EZ Use B-12, Primabalt What should I tell my health care provider before I take this medicine? They need to know if you have any of these conditions: -kidney disease -Leber's disease -megaloblastic anemia -an unusual or allergic reaction to cyanocobalamin, cobalt, other medicines, foods, dyes, or preservatives -pregnant or trying to get pregnant -breast-feeding How should I use this medicine? This medicine is injected into a muscle or deeply under the skin. It is usually given by a health care professional in a clinic or doctor's office. However, your doctor may teach you how to inject yourself. Follow all instructions. Talk to your pediatrician regarding the use of this medicine in children. Special care may be needed. Overdosage: If you think you have taken too much of this medicine contact a poison control center or emergency room at once. NOTE: This medicine is only for you. Do not share this medicine with others. What if I miss a dose? If you are given your dose at a clinic or doctor's office, call to reschedule your appointment. If you give your own injections and you miss a dose, take it as soon as you can. If it is almost time for your next dose, take only that dose. Do not take double or extra doses. What may interact with this medicine? -colchicine -heavy alcohol intake This list may not describe all possible  interactions. Give your health care provider a list of all the medicines, herbs, non-prescription drugs, or dietary supplements you use. Also tell them if you smoke, drink alcohol, or use illegal drugs. Some items may interact with your medicine. What should I watch for while using this medicine? Visit your doctor or health care professional regularly. You may need blood work done while you are taking this medicine. You may need to follow a special diet. Talk to your doctor. Limit your alcohol intake and avoid smoking to get the best benefit. What side effects may I notice from receiving this medicine? Side effects that you should report to your doctor or health care professional as soon as possible: -allergic reactions like skin rash, itching or hives, swelling of the face, lips, or tongue -blue tint to skin -chest tightness, pain -difficulty breathing, wheezing -dizziness -red, swollen painful area on the leg Side effects that usually do not require medical attention (report to your doctor or health care professional if they continue or are bothersome): -diarrhea -headache This list may not describe all possible side effects. Call your doctor for medical advice about side effects. You may report side effects to FDA at 1-800-FDA-1088. Where should I keep my medicine? Keep out of the reach of children. Store at room temperature between 15 and 30 degrees C (59 and 85 degrees F). Protect from light. Throw away any unused medicine after the expiration date. NOTE: This sheet is a summary. It may not cover all possible information. If you have questions about this medicine, talk to your doctor, pharmacist, or   health care provider.  2019 Elsevier/Gold Standard (2008-03-05 22:10:20)  

## 2019-01-17 NOTE — Telephone Encounter (Signed)
Called and spoke with Pt. I advised her she will have to let the Emgality run through her [facility] part D coverage and see what her cost will be. Un fortunately, the co-pay cards are not going to help if she has [facility]

## 2019-01-23 ENCOUNTER — Encounter: Payer: Self-pay | Admitting: Neurology

## 2019-01-23 NOTE — Progress Notes (Signed)
Received approval for Ajovy 225mg /1.32ml valid 12/24/2018 through 01/23/2020.

## 2019-01-25 ENCOUNTER — Other Ambulatory Visit: Payer: Self-pay | Admitting: Family Medicine

## 2019-01-25 DIAGNOSIS — F419 Anxiety disorder, unspecified: Secondary | ICD-10-CM

## 2019-01-25 NOTE — Telephone Encounter (Signed)
Requesting:xanax Contract:yes UDS:low risk next screen 12/19 Last OV:08/02/18 Next OV:02/02/19 Last Refill:12/23/18 #[id number] Database:   Please advise

## 2019-01-26 ENCOUNTER — Other Ambulatory Visit: Payer: Self-pay | Admitting: Family Medicine

## 2019-01-26 DIAGNOSIS — F419 Anxiety disorder, unspecified: Secondary | ICD-10-CM

## 2019-01-26 MED ORDER — ALPRAZOLAM 1 MG PO TABS: 1.0000 mg | ORAL_TABLET | Freq: Every evening | ORAL | 0 refills | Status: DC | PRN

## 2019-01-26 NOTE — Telephone Encounter (Signed)
Sent in

## 2019-01-27 NOTE — Telephone Encounter (Signed)
I sent this yesterday

## 2019-02-02 ENCOUNTER — Encounter: Payer: Self-pay | Admitting: Family Medicine

## 2019-02-02 ENCOUNTER — Ambulatory Visit (INDEPENDENT_AMBULATORY_CARE_PROVIDER_SITE_OTHER): Payer: Medicare Other | Admitting: Family Medicine

## 2019-02-02 VITALS — BP 122/76 | HR 54 | Resp 14 | Ht 62.0 in | Wt 168.0 lb

## 2019-02-02 DIAGNOSIS — R5383 Other fatigue: Secondary | ICD-10-CM

## 2019-02-02 DIAGNOSIS — I10 Essential (primary) hypertension: Secondary | ICD-10-CM | POA: Diagnosis not present

## 2019-02-02 DIAGNOSIS — L659 Nonscarring hair loss, unspecified: Secondary | ICD-10-CM

## 2019-02-02 DIAGNOSIS — M791 Myalgia, unspecified site: Secondary | ICD-10-CM

## 2019-02-02 DIAGNOSIS — E2839 Other primary ovarian failure: Secondary | ICD-10-CM | POA: Insufficient documentation

## 2019-02-02 HISTORY — DX: Myalgia, unspecified site: M79.10

## 2019-02-02 HISTORY — DX: Nonscarring hair loss, unspecified: L65.9

## 2019-02-02 HISTORY — DX: Other primary ovarian failure: E28.39

## 2019-02-02 HISTORY — DX: Other fatigue: R53.83

## 2019-02-02 NOTE — Patient Instructions (Signed)

## 2019-02-02 NOTE — Assessment & Plan Note (Signed)
Improving  Recheck thyroid

## 2019-02-02 NOTE — Assessment & Plan Note (Signed)
Well controlled, no changes to meds. Encouraged heart healthy diet such as the DASH diet and exercise as tolerated.  °

## 2019-02-02 NOTE — Progress Notes (Signed)
Patient ID: Ebony Garcia, female    DOB: 1951-03-14  Age: 68 y.o. MRN: [id number]    Subjective:  Subjective  HPI Ebony Garcia presents for f/u bp   She has not new complaints.    Neuro started her on emgality and she has not had a migraine since.  Her ins will not cover it however and she is so scared to get another migraine.    Review of Systems  Constitutional: Negative for appetite change, diaphoresis, fatigue and unexpected weight change.  Eyes: Negative for pain, redness and visual disturbance.  Respiratory: Negative for cough, chest tightness, shortness of breath and wheezing.   Cardiovascular: Negative for chest pain, palpitations and leg swelling.  Endocrine: Negative for cold intolerance, heat intolerance, polydipsia, polyphagia and polyuria.  Genitourinary: Negative for difficulty urinating, dysuria and frequency.  Neurological: Positive for headaches. Negative for dizziness, light-headedness and numbness.    History Past Medical History:  Diagnosis Date  . Cancer ([place])   . CHF (congestive heart failure) ([place])    dystolic heart dysfunction  . Chronic kidney disease   . Depression   . Depression with anxiety   . Diabetes mellitus without complication ([place])    Diet controlled  . Hypertension   . Intertrigo   . Migraines   . Panic attacks     She has a past surgical history that includes Total abdominal hysterectomy w/ bilateral salpingoophorectomy; Tonsillectomy and adenoidectomy; Nasal septum surgery; Tubal ligation; Dilation and curettage of uterus; papaloma (Right); Breast surgery; and Abdominal hysterectomy.   Her family history includes Anemia in her cousin; Atrial fibrillation in her mother; Bipolar disorder in her brother and sister; Bladder Cancer in her father; Cancer in her brother, cousin, father, maternal aunt, maternal aunt, and paternal grandfather; Colon cancer in her maternal uncle; Crohn's disease in her mother; Heart attack in her maternal  grandmother; Heart failure in her mother.She reports that she has never smoked. She has never used smokeless tobacco. She reports that she does not drink alcohol or use drugs.  Current Outpatient Medications on File Prior to Visit  Medication Sig Dispense Refill  . ALPRAZolam (XANAX) 1 MG tablet Take 1 tablet (1 mg total) by mouth at bedtime as needed. for sleep 30 tablet 0  . estradiol (ESTRACE) 0.5 MG tablet Take 0.5 mg by mouth daily. 1/2 tab daily    . FLUoxetine (PROZAC) 20 MG capsule Take 1 capsule (20 mg total) by mouth daily. 90 capsule 3  . furosemide (LASIX) 20 MG tablet TAKE 1 TABLET BY MOUTH ONCE DAILY 90 tablet 1  . Galcanezumab-gnlm (EMGALITY) 120 MG/ML SOSY Inject 120 mg into the skin every 30 (thirty) days. 1 Syringe 11  . metoprolol succinate (TOPROL-XL) 50 MG 24 hr tablet TAKE 1 TABLET BY MOUTH ONCE DAILY WITH  OR  IMMEDIATELY  FOLLOWING  A  MEAL 90 tablet 1  . nitroGLYCERIN (NITROSTAT) 0.4 MG SL tablet Place 1 tablet (0.4 mg total) under the tongue every 5 (five) minutes as needed for chest pain. 25 tablet 3  . promethazine (PHENERGAN) 25 MG tablet TAKE ONE TABLET BY MOUTH EVERY 6 HOURS AS NEEDED FOR NAUSEA AND VOMITING 30 tablet 2  . rizatriptan (MAXALT) 10 MG tablet TAKE 1 TABLET EARLIEST ONSET OF HEADACHE. MAY REPEAT ONCE IN 2 HOURS IF NEEDED 10 tablet 1  . SUMAtriptan (TOSYMRA) 10 MG/ACT SOLN Place 10 mg into the nose every hour as needed (Maximum 3 sprays/24 hours). 9 each 3  . topiramate (TOPAMAX) 100  MG tablet TAKE 1 TABLET BY MOUTH TWICE DAILY 60 tablet 5  . topiramate (TOPAMAX) 100 MG tablet TAKE 1 TABLET BY MOUTH TWICE DAILY 180 tablet 1  . triamcinolone ointment (KENALOG) 0.1 % Apply topically as needed    . zolmitriptan (ZOMIG) 5 MG nasal solution Place 1 spray into the nose as needed for migraine. 3 Units 5   No current facility-administered medications on file prior to visit.      Objective:  Objective  Physical Exam Constitutional:      Appearance: She is  well-developed.  HENT:     Head: Normocephalic and atraumatic.  Eyes:     Conjunctiva/sclera: Conjunctivae normal.  Neck:     Musculoskeletal: Normal range of motion and neck supple.     Thyroid: No thyromegaly.     Vascular: No carotid bruit or JVD.  Cardiovascular:     Rate and Rhythm: Normal rate and regular rhythm.     Heart sounds: Normal heart sounds. No murmur.  Pulmonary:     Effort: Pulmonary effort is normal. No respiratory distress.     Breath sounds: Normal breath sounds. No wheezing or rales.  Chest:     Chest wall: No tenderness.  Neurological:     Mental Status: She is alert and oriented to person, place, and time.    BP 122/76   Pulse (!) 54   Resp 14   Ht 5\' 2"  (1.575 m)   Wt 168 lb (76.2 kg)   SpO2 98%   BMI 30.73 kg/m  Wt Readings from Last 3 Encounters:  02/02/19 168 lb (76.2 kg)  01/11/19 167 lb (75.8 kg)  09/14/18 163 lb (73.9 kg)     Lab Results  Component Value Date   WBC 6.4 12/16/2018   HGB 13.9 12/16/2018   HCT 44.7 12/16/2018   PLT 75 (L) 12/16/2018   GLUCOSE 131 (H) 12/16/2018   CHOL 203 (H) 09/20/2017   TRIG 201.0 (H) 09/20/2017   HDL 43.90 09/20/2017   LDLDIRECT 121.0 09/20/2017   LDLCALC 80 05/14/2017   ALT 11 12/16/2018   AST 13 (L) 12/16/2018   NA 139 12/16/2018   K 3.9 12/16/2018   CL 107 12/16/2018   CREATININE 1.09 (H) 12/16/2018   BUN 12 12/16/2018   CO2 25 12/16/2018   TSH 3.62 11/03/2016   INR 0.93 07/19/2010   HGBA1C 5.9 12/08/2012   MICROALBUR 1.1 09/20/2017    Mr Brain Wo Contrast  Result Date: 08/27/2015 CLINICAL DATA:  68 year old hypertensive diabetic female with headache. Mass in back of her head on the left side which has been present for many months. On disability for migraines. Cancer, type not specified. Initial encounter. EXAM: MRI HEAD WITHOUT CONTRAST TECHNIQUE: Multiplanar, multiecho pulse sequences of the brain and surrounding structures were obtained without intravenous contrast. COMPARISON:   08/15/2008 brain MR. FINDINGS: No mass noted left-side of head. There is slight asymmetry of subcutaneous fat. Question if this is what patient perceives as an abnormality. No acute infarct. No intracranial hemorrhage. Minimal punctate nonspecific white matter type changes most notable frontal lobes. This may be related to patient's migraine headaches or small vessel disease. No intracranial mass lesion noted on this unenhanced exam. No hydrocephalus. Major intracranial vascular structures are patent. Partial opacification left mastoid air cells. No obstructing lesion of the eustachian tube identified. Tiny parotid lesions suggestive of small lymph nodes. Minimal to mild mucosal thickening frontal sinuses, ethmoid sinus air cells and maxillary sinuses. Mild transverse ligament hypertrophy. Cervicomedullary junction,  pituitary region, pineal region orbital structures unremarkable. IMPRESSION: No mass noted left-side of head. There is slight asymmetry of subcutaneous fat. Question if this is what patient perceives as an abnormality. No acute infarct. No intracranial hemorrhage. Minimal punctate nonspecific white matter type changes most notable frontal lobes. This may be related to patient's migraine headaches or small vessel disease. No intracranial mass lesion noted on this unenhanced exam. Partial opacification left mastoid air cells. No obstructing lesion of the eustachian tube identified. Minimal to mild mucosal thickening frontal sinuses, ethmoid sinus air cells and maxillary sinuses. Electronically Signed   By: Genia Del M.D.   On: 08/27/2015 10:03     Assessment & Plan:  Plan  I am having Ebony Garcia maintain her nitroGLYCERIN, triamcinolone ointment, estradiol, promethazine, topiramate, rizatriptan, FLUoxetine, topiramate, furosemide, metoprolol succinate, zolmitriptan, SUMAtriptan, Galcanezumab-gnlm, and ALPRAZolam.  No orders of the defined types were placed in this  encounter.   Problem List Items Addressed This Visit      Unprioritized   Essential hypertension - Primary    Well controlled, no changes to meds. Encouraged heart healthy diet such as the DASH diet and exercise as tolerated.       Relevant Orders   Lipid panel   Comprehensive metabolic panel   Estrogen deficiency   Fatigue   Relevant Orders   Thyroid Panel With TSH   Hair loss    Improving  Recheck thyroid       Relevant Orders   Thyroid Panel With TSH   Myalgia      Follow-up: Return in about 6 months (around 08/03/2019), or if symptoms worsen or fail to improve, for hypertension, fasting.  [name], DO

## 2019-02-09 ENCOUNTER — Telehealth: Payer: Self-pay | Admitting: Neurology

## 2019-02-09 MED ORDER — SUMATRIPTAN 10 MG/ACT NA SOLN: 1.0000 | Freq: Once | NASAL | 0 refills | Status: DC

## 2019-02-09 NOTE — Telephone Encounter (Signed)
Yes.  The Tosymra (1 spray in one nostril.  May repeat in one hour if needed (maximum 3 sprays in 24 hours)

## 2019-02-09 NOTE — Telephone Encounter (Signed)
Looks like we have Tosymra (which is the last nasal spray provided to patient) and Zomig.  Dr. Tomi Likens - which sample would you like me to give to patient?

## 2019-02-09 NOTE — Telephone Encounter (Signed)
Patient said that she was told she could get samples of the nasal spray when they came in. She was checking to see if they were here. Its the sumatriptan nasal spray but she said she could also take the Zomig or Emirtix. Please call her back at [phone]. Thanks!

## 2019-02-09 NOTE — Telephone Encounter (Signed)
Called patient. LMOM letting her know we do have samples at the front for pick up (did not leave the name of the medication, as no DPR on file). Will leave instructions with medication for pick up.

## 2019-02-13 ENCOUNTER — Inpatient Hospital Stay: Payer: Medicare Other | Attending: Hematology & Oncology

## 2019-02-13 VITALS — BP 121/76 | HR 51 | Temp 98.2°F | Resp 16

## 2019-02-13 DIAGNOSIS — D6959 Other secondary thrombocytopenia: Secondary | ICD-10-CM | POA: Insufficient documentation

## 2019-02-13 DIAGNOSIS — E538 Deficiency of other specified B group vitamins: Secondary | ICD-10-CM

## 2019-02-13 MED ORDER — CYANOCOBALAMIN 1000 MCG/ML IJ SOLN
1000.0000 ug | Freq: Once | INTRAMUSCULAR | Status: AC
  Administered 2019-02-13: 1000 ug via INTRAMUSCULAR

## 2019-02-13 NOTE — Patient Instructions (Signed)
Cyanocobalamin, Vitamin B12 injection What is this medicine? CYANOCOBALAMIN (sye an oh koe BAL a min) is a man made form of vitamin B12. Vitamin B12 is used in the growth of healthy blood cells, nerve cells, and proteins in the body. It also helps with the metabolism of fats and carbohydrates. This medicine is used to treat people who can not absorb vitamin B12. This medicine may be used for other purposes; ask your health care provider or pharmacist if you have questions. COMMON BRAND NAME(S): B-12 Compliance Kit, B-12 Injection Kit, Cyomin, LA-12, Nutri-Twelve, Physicians EZ Use B-12, Primabalt What should I tell my health care provider before I take this medicine? They need to know if you have any of these conditions: -kidney disease -Leber's disease -megaloblastic anemia -an unusual or allergic reaction to cyanocobalamin, cobalt, other medicines, foods, dyes, or preservatives -pregnant or trying to get pregnant -breast-feeding How should I use this medicine? This medicine is injected into a muscle or deeply under the skin. It is usually given by a health care professional in a clinic or doctor's office. However, your doctor may teach you how to inject yourself. Follow all instructions. Talk to your pediatrician regarding the use of this medicine in children. Special care may be needed. Overdosage: If you think you have taken too much of this medicine contact a poison control center or emergency room at once. NOTE: This medicine is only for you. Do not share this medicine with others. What if I miss a dose? If you are given your dose at a clinic or doctor's office, call to reschedule your appointment. If you give your own injections and you miss a dose, take it as soon as you can. If it is almost time for your next dose, take only that dose. Do not take double or extra doses. What may interact with this medicine? -colchicine -heavy alcohol intake This list may not describe all possible  interactions. Give your health care provider a list of all the medicines, herbs, non-prescription drugs, or dietary supplements you use. Also tell them if you smoke, drink alcohol, or use illegal drugs. Some items may interact with your medicine. What should I watch for while using this medicine? Visit your doctor or health care professional regularly. You may need blood work done while you are taking this medicine. You may need to follow a special diet. Talk to your doctor. Limit your alcohol intake and avoid smoking to get the best benefit. What side effects may I notice from receiving this medicine? Side effects that you should report to your doctor or health care professional as soon as possible: -allergic reactions like skin rash, itching or hives, swelling of the face, lips, or tongue -blue tint to skin -chest tightness, pain -difficulty breathing, wheezing -dizziness -red, swollen painful area on the leg Side effects that usually do not require medical attention (report to your doctor or health care professional if they continue or are bothersome): -diarrhea -headache This list may not describe all possible side effects. Call your doctor for medical advice about side effects. You may report side effects to FDA at 1-800-FDA-1088. Where should I keep my medicine? Keep out of the reach of children. Store at room temperature between 15 and 30 degrees C (59 and 85 degrees F). Protect from light. Throw away any unused medicine after the expiration date. NOTE: This sheet is a summary. It may not cover all possible information. If you have questions about this medicine, talk to your doctor, pharmacist, or   health care provider.  2019 Elsevier/Gold Standard (2008-03-05 22:10:20)  

## 2019-03-03 ENCOUNTER — Other Ambulatory Visit: Payer: Self-pay | Admitting: Family Medicine

## 2019-03-03 DIAGNOSIS — F419 Anxiety disorder, unspecified: Secondary | ICD-10-CM

## 2019-03-03 NOTE — Telephone Encounter (Signed)
Requesting: Xanax Contract: N/A UDS: N/A Last OV: 02/02/2019  Next OV: 08/03/2019 Last Refill: 01/26/2019, #[id number]-- [id number] Database:   Please advise

## 2019-03-16 ENCOUNTER — Encounter: Payer: Self-pay | Admitting: Family

## 2019-03-16 ENCOUNTER — Telehealth: Payer: Self-pay | Admitting: Family

## 2019-03-16 ENCOUNTER — Other Ambulatory Visit: Payer: Self-pay

## 2019-03-16 ENCOUNTER — Inpatient Hospital Stay: Payer: Medicare Other

## 2019-03-16 ENCOUNTER — Inpatient Hospital Stay: Payer: Medicare Other | Attending: Hematology & Oncology | Admitting: Family

## 2019-03-16 VITALS — BP 105/67 | HR 51 | Temp 98.3°F | Resp 18 | Wt 168.5 lb

## 2019-03-16 DIAGNOSIS — D696 Thrombocytopenia, unspecified: Secondary | ICD-10-CM

## 2019-03-16 DIAGNOSIS — E538 Deficiency of other specified B group vitamins: Secondary | ICD-10-CM

## 2019-03-16 DIAGNOSIS — Z79899 Other long term (current) drug therapy: Secondary | ICD-10-CM | POA: Diagnosis not present

## 2019-03-16 DIAGNOSIS — R200 Anesthesia of skin: Secondary | ICD-10-CM | POA: Insufficient documentation

## 2019-03-16 DIAGNOSIS — D6959 Other secondary thrombocytopenia: Secondary | ICD-10-CM | POA: Insufficient documentation

## 2019-03-16 LAB — CBC WITH DIFFERENTIAL (CANCER CENTER ONLY)
Abs Immature Granulocytes: 0.02 10*3/uL (ref 0.00–0.07)
Basophils Absolute: 0 10*3/uL (ref 0.0–0.1)
Basophils Relative: 1 %
Eosinophils Absolute: 0.2 10*3/uL (ref 0.0–0.5)
Eosinophils Relative: 3 %
HCT: 44.4 % (ref 36.0–46.0)
Hemoglobin: 14.1 g/dL (ref 12.0–15.0)
Immature Granulocytes: 0 %
Lymphocytes Relative: 23 %
Lymphs Abs: 1.5 10*3/uL (ref 0.7–4.0)
MCH: 30.5 pg (ref 26.0–34.0)
MCHC: 31.8 g/dL (ref 30.0–36.0)
MCV: 95.9 fL (ref 80.0–100.0)
Monocytes Absolute: 0.5 10*3/uL (ref 0.1–1.0)
Monocytes Relative: 7 %
Neutro Abs: 4.6 10*3/uL (ref 1.7–7.7)
Neutrophils Relative %: 66 %
Platelet Count: 63 10*3/uL — ABNORMAL LOW (ref 150–400)
RBC: 4.63 MIL/uL (ref 3.87–5.11)
RDW: 13.1 % (ref 11.5–15.5)
WBC Count: 6.8 10*3/uL (ref 4.0–10.5)
nRBC: 0 % (ref 0.0–0.2)

## 2019-03-16 LAB — CMP (CANCER CENTER ONLY)
ALT: 10 U/L (ref 0–44)
AST: 13 U/L — ABNORMAL LOW (ref 15–41)
Albumin: 4.6 g/dL (ref 3.5–5.0)
Alkaline Phosphatase: 87 U/L (ref 38–126)
Anion gap: 8 (ref 5–15)
BUN: 10 mg/dL (ref 8–23)
CO2: 24 mmol/L (ref 22–32)
Calcium: 8.8 mg/dL — ABNORMAL LOW (ref 8.9–10.3)
Chloride: 108 mmol/L (ref 98–111)
Creatinine: 1.01 mg/dL — ABNORMAL HIGH (ref 0.44–1.00)
GFR, Est AFR Am: >60 mL/min (ref >60)
GFR, Estimated: 58 mL/min — ABNORMAL LOW (ref >60)
Glucose, Bld: 105 mg/dL — ABNORMAL HIGH (ref 70–99)
Potassium: 3.8 mmol/L (ref 3.5–5.1)
Sodium: 140 mmol/L (ref 135–145)
Total Bilirubin: 0.4 mg/dL (ref 0.3–1.2)
Total Protein: 7.1 g/dL (ref 6.5–8.1)

## 2019-03-16 LAB — PLATELET BY CITRATE

## 2019-03-16 LAB — VITAMIN B12: Vitamin B-12: 286 pg/mL (ref 180–914)

## 2019-03-16 MED ORDER — CYANOCOBALAMIN 1000 MCG/ML IJ SOLN
1000.0000 ug | Freq: Once | INTRAMUSCULAR | Status: AC
  Administered 2019-03-16: 12:00:00 1000 ug via INTRAMUSCULAR

## 2019-03-16 MED ORDER — CYANOCOBALAMIN 1000 MCG/ML IJ SOLN
INTRAMUSCULAR | Status: AC
  Filled 2019-03-16: qty 1

## 2019-03-16 NOTE — Progress Notes (Signed)
Hematology and Oncology Follow Up Visit  Ebony Garcia [id number] 07/11/1951 68 y.o. 03/16/2019   Principle Diagnosis:  Medication induced thrombocytopenia B 12 deficiency   Current Therapy:   Observation B 12 injection monthly   Interim History:  Ebony Garcia is here today for follow-up and B 12 injection. She is doing well and states that since starting the B 12 injections she can tell her energy is improved.  She plans to also start taking a vitamin D supplement daily.  Her WBC count and Hgb are stable. Platelet count is 63.  She denies having any episodes of bleeding, no bruising or petechiae.  She has had no fever, chills, n/v, cough, rash, dizziness, SOB, palpitations, abdominal pain or changes in bowel or bladder habits.  Her occasional angina is unchanged and she states that she feels these episodes of related to the stress in her personal life.  She has not had any more migraines since starting the monthly Emgality injection.  No swelling or tenderness in her extremities. She is taking her lasix daily as prescribed.  She has numbness and tingling in her hands and feet off and on.  No lymphadenopathy noted on exam.  Her appetite comes and goes. She states that she does not eat 2 full meals a day. She is hydrating. Her weight is stable.   ECOG Performance Status: 1 - Symptomatic but completely ambulatory  Medications:  Allergies as of 03/16/2019      Reactions   Amoxicillin-pot Clavulanate Diarrhea   Antihistamines, Diphenhydramine-type Other (See Comments)   Other Reaction: makes pt nervous   Diphenhydramine Hcl Other (See Comments)   Other Reaction: makes pt nervous   Amoxicillin-pot Clavulanate Diarrhea   Clindamycin    She has not taken but per patient  "her mother had a reaction and she does not want it ever"   Hydrocodone-acetaminophen    REACTION: sensitive to  but can take for extreme pain per patient   Nortriptyline    Propoxyphene Hcl Nausea Only   Propoxyphene N-acetaminophen Nausea Only   Sulfa Antibiotics    Unknown    Tramadol Other (See Comments)   Pt does know what kind of effect   Fremanezumab-vfrm Rash, Other (See Comments)   Seizure, jerking   Lisinopril    Restless , irregular sleep      Medication List       Accurate as of March 16, 2019 11:58 AM. Always use your most recent med list.        ALPRAZolam 1 MG tablet Commonly known as:  XANAX TAKE 1 TABLET BY MOUTH AT BEDTIME AS NEEDED FOR SLEEP   estradiol 0.5 MG tablet Commonly known as:  ESTRACE Take 0.5 mg by mouth daily. 1/2 tab daily   FLUoxetine 20 MG capsule Commonly known as:  PROZAC Take 1 capsule (20 mg total) by mouth daily.   furosemide 20 MG tablet Commonly known as:  LASIX TAKE 1 TABLET BY MOUTH ONCE DAILY   Galcanezumab-gnlm 120 MG/ML Sosy Commonly known as:  Emgality Inject 120 mg into the skin every 30 (thirty) days.   metoprolol succinate 50 MG 24 hr tablet Commonly known as:  TOPROL-XL TAKE 1 TABLET BY MOUTH ONCE DAILY WITH  OR  IMMEDIATELY  FOLLOWING  A  MEAL   nitroGLYCERIN 0.4 MG SL tablet Commonly known as:  NITROSTAT Place 1 tablet (0.4 mg total) under the tongue every 5 (five) minutes as needed for chest pain.   promethazine 25 MG tablet Commonly known  as:  PHENERGAN TAKE ONE TABLET BY MOUTH EVERY 6 HOURS AS NEEDED FOR NAUSEA AND VOMITING   rizatriptan 10 MG tablet Commonly known as:  MAXALT TAKE 1 TABLET EARLIEST ONSET OF HEADACHE. MAY REPEAT ONCE IN 2 HOURS IF NEEDED   SUMAtriptan 10 MG/ACT Soln Commonly known as:  Tosymra Place 10 mg into the nose every hour as needed (Maximum 3 sprays/24 hours).   SUMAtriptan 10 MG/ACT Soln Commonly known as:  Tosymra Place 1 spray into the nose once for 1 dose.   topiramate 100 MG tablet Commonly known as:  TOPAMAX TAKE 1 TABLET BY MOUTH TWICE DAILY   topiramate 100 MG tablet Commonly known as:  TOPAMAX TAKE 1 TABLET BY MOUTH TWICE DAILY   triamcinolone ointment 0.1 %  Commonly known as:  KENALOG Apply topically as needed   zolmitriptan 5 MG nasal solution Commonly known as:  Zomig Place 1 spray into the nose as needed for migraine.       Allergies:  Allergies  Allergen Reactions  . Amoxicillin-Pot Clavulanate Diarrhea  . Antihistamines, Diphenhydramine-Type Other (See Comments)    Other Reaction: makes pt nervous  . Diphenhydramine Hcl Other (See Comments)    Other Reaction: makes pt nervous  . Amoxicillin-Pot Clavulanate Diarrhea  . Clindamycin     She has not taken but per patient  "her mother had a reaction and she does not want it ever"  . Hydrocodone-Acetaminophen     REACTION: sensitive to  but can take for extreme pain per patient  . Nortriptyline   . Propoxyphene Hcl Nausea Only  . Propoxyphene N-Acetaminophen Nausea Only  . Sulfa Antibiotics     Unknown   . Tramadol Other (See Comments)    Pt does know what kind of effect  . Fremanezumab-Vfrm Rash and Other (See Comments)    Seizure, jerking  . Lisinopril     Restless , irregular sleep    Past Medical History, Surgical history, Social history, and Family History were reviewed and updated.  Review of Systems: All other 10 point review of systems is negative.   Physical Exam:  vitals were not taken for this visit.   Wt Readings from Last 3 Encounters:  02/02/19 168 lb (76.2 kg)  01/11/19 167 lb (75.8 kg)  09/14/18 163 lb (73.9 kg)    Ocular: Sclerae unicteric, pupils equal, round and reactive to light Ear-nose-throat: Oropharynx clear, dentition fair Lymphatic: No cervical or supraclavicular adenopathy Lungs no rales or rhonchi, good excursion bilaterally Heart regular rate and rhythm, no murmur appreciated Abd soft, nontender, positive bowel sounds, no liver or spleen tip palpated on exam, no fluid wave  MSK no focal spinal tenderness, no joint edema Neuro: non-focal, well-oriented, appropriate affect Breasts: Deferred   Lab Results  Component Value Date   WBC  6.8 03/16/2019   HGB 14.1 03/16/2019   HCT 44.4 03/16/2019   MCV 95.9 03/16/2019   PLT 63 (L) 03/16/2019   No results found for: FERRITIN, IRON, TIBC, UIBC, IRONPCTSAT Lab Results  Component Value Date   RBC 4.63 03/16/2019   No results found for: KPAFRELGTCHN, LAMBDASER, KAPLAMBRATIO No results found for: IGGSERUM, IGA, IGMSERUM No results found for: Ronnald Ramp, A1GS, A2GS, BETS, BETA2SER, GAMS, MSPIKE, SPEI   Chemistry      Component Value Date/Time   NA 140 03/16/2019 1114   NA 143 11/11/2017 1339   K 3.8 03/16/2019 1114   K 3.6 11/11/2017 1339   CL 108 03/16/2019 1114   CL 109 (H)  11/11/2017 1339   CO2 24 03/16/2019 1114   CO2 24 11/11/2017 1339   BUN 10 03/16/2019 1114   BUN 9 11/11/2017 1339   CREATININE 1.01 (H) 03/16/2019 1114   CREATININE 1.0 11/11/2017 1339      Component Value Date/Time   CALCIUM 8.8 (L) 03/16/2019 1114   CALCIUM 9.3 11/11/2017 1339   ALKPHOS 87 03/16/2019 1114   ALKPHOS 85 (H) 11/11/2017 1339   AST 13 (L) 03/16/2019 1114   ALT 10 03/16/2019 1114   ALT 24 11/11/2017 1339   BILITOT 0.4 03/16/2019 1114       Impression and Plan: Ebony Garcia is a pleasant 68 yo caucasian female with medication induced thrombocytopenia.  She is doing well and feels that the B 12 injections are helping. We will see what her level today looks like and for now she will continue her monthly regimen.   She is interested in papya extract for her thrombocytopenia and plans to give it a try.  We will see her back in another 8 weeks for follow-up.  She will contact our office with any questions or concerns. We can certainly see her sooner if need be.   Laverna Peace, NP 4/9/202011:58 AM

## 2019-03-16 NOTE — Telephone Encounter (Signed)
Appointments scheduled letter/calendar mailed per 4/9 los

## 2019-03-16 NOTE — Patient Instructions (Signed)

## 2019-04-02 ENCOUNTER — Other Ambulatory Visit: Payer: Self-pay | Admitting: Family Medicine

## 2019-04-02 ENCOUNTER — Other Ambulatory Visit: Payer: Self-pay | Admitting: Neurology

## 2019-04-02 DIAGNOSIS — F419 Anxiety disorder, unspecified: Secondary | ICD-10-CM

## 2019-04-03 NOTE — Telephone Encounter (Signed)
Requesting: Xanax Contract: N/A UDS: 11/2018---Low risk Last OV: 02/02/2019 Next OV: 08/03/2019 Last Refill: 03/03/2019, #30--0 RF Database:   Please advise

## 2019-04-10 ENCOUNTER — Ambulatory Visit: Payer: Self-pay | Admitting: *Deleted

## 2019-04-10 ENCOUNTER — Encounter: Payer: Self-pay | Admitting: Family Medicine

## 2019-04-10 ENCOUNTER — Other Ambulatory Visit: Payer: Self-pay

## 2019-04-10 ENCOUNTER — Ambulatory Visit (INDEPENDENT_AMBULATORY_CARE_PROVIDER_SITE_OTHER): Payer: Medicare Other | Admitting: Family Medicine

## 2019-04-10 DIAGNOSIS — Z20828 Contact with and (suspected) exposure to other viral communicable diseases: Secondary | ICD-10-CM

## 2019-04-10 DIAGNOSIS — B349 Viral infection, unspecified: Secondary | ICD-10-CM

## 2019-04-10 HISTORY — DX: Viral infection, unspecified: B34.9

## 2019-04-10 NOTE — Assessment & Plan Note (Signed)
?  covid 19---- pt with mult symptoms that could be covid Pt advised to quarantine for 14 days  If inc sob / chest pain--- go to Er She will use pepto bismol for diarrhea and upset stomach

## 2019-04-10 NOTE — Telephone Encounter (Signed)
Called patient to schedule appointment. Patient already has one scheduled for today.

## 2019-04-10 NOTE — Telephone Encounter (Signed)
Pt reporting multiple symptoms:Fatigue, decreased energy and appetite, mild dry cough, mild sore thraot, mild SOB at times, at rest and with exertion. States had infected tooth 3 weeks ago "And failed root canal." States was scheduled for oral surgery but was cancelled. States "I thought these symptoms were from my tooth but now I'm reading could be Covid-19." States had chills past week, not presently. Unsure if febrile. Also states "Migraines are worse."  Present headache. Also reports "Blisters in mouth, tongue feels like it's on fire." Pt's Email and phone number verified. CAll transferred to practice, [facility], for consideration of appt.  Reason for Disposition . MILD difficulty breathing (e.g., minimal/no SOB at rest, SOB with walking, pulse <100)  Answer Assessment - Initial Assessment Questions 1. COVID-19 DIAGNOSIS: "Who made your Coronavirus (COVID-19) diagnosis?" "Was it confirmed by a positive lab test?" If not diagnosed by a HCP, ask "Are there lots of cases (community spread) where you live?" (See public health department website, if unsure)   * MAJOR community spread: high number of cases; numbers of cases are increasing; many people hospitalized.   * MINOR community spread: low number of cases; not increasing; few or no people hospitalized     N/A 2. ONSET: "When did the COVID-19 symptoms start?"      3 weeks 3. WORST SYMPTOM: "What is your worst symptom?" (e.g., cough, fever, shortness of breath, muscle aches)     Fatigue,sore throat(mild) mild SOB at rest and with exertion 4. COUGH: "Do you have a cough?" If so, ask: "How bad is the cough?"       Cough dry, mild 5. FEVER: "Do you have a fever?" If so, ask: "What is your temperature, how was it measured, and when did it start?"     Unsure 6. RESPIRATORY STATUS: "Describe your breathing?" (e.g., shortness of breath, wheezing, unable to speak)      SOB at rest and with exertion, intermittent 7. BETTER-SAME-WORSE: "Are you getting  better, staying the same or getting worse compared to yesterday?"  If getting worse, ask, "In what way?"    Same 8. HIGH RISK DISEASE: "Do you have any chronic medical problems?" (e.g., asthma, heart or lung disease, weak immune system, etc.)     *No Answer*  10. OTHER SYMPTOMS: "Do you have any other symptoms?"  (e.g., runny nose, headache, sore throat, loss of smell)     Migraines "BAck"  Protocols used: CORONAVIRUS (COVID-19) DIAGNOSED OR SUSPECTED-A-AH

## 2019-04-10 NOTE — Progress Notes (Signed)
Virtual Visit via Video Note  I connected with Ebony Garcia on 04/10/19 at  3:15 PM EDT by a video enabled telemedicine application and verified that I am speaking with the correct person using two identifiers.  Location: Patient: home  Provider: home   I discussed the limitations of evaluation and management by telemedicine and the availability of in person appointments. The patient expressed understanding and agreed to proceed.  History of Present Illness: Pt is home c/o sore throat, body aches and rash , diarrhea and a lot of them are still present.  She is sleeping a lot   She denies fever and chils  She says she has sores in her mouth and has a hx lichen in her mouth.   Past Medical History:  Diagnosis Date  . Cancer ([place])   . CHF (congestive heart failure) ([place])    dystolic heart dysfunction  . Chronic kidney disease   . Depression   . Depression with anxiety   . Diabetes mellitus without complication ([place])    Diet controlled  . Hypertension   . Intertrigo   . Migraines   . Panic attacks    Outpatient Encounter Medications as of 04/10/2019  Medication Sig Note  . ALPRAZolam (XANAX) 1 MG tablet TAKE 1 TABLET BY MOUTH AT BEDTIME AS NEEDED FOR SLEEP   . estradiol (ESTRACE) 0.5 MG tablet Take 0.5 mg by mouth daily. 1/2 tab daily   . FLUoxetine (PROZAC) 20 MG capsule Take 1 capsule (20 mg total) by mouth daily.   . furosemide (LASIX) 20 MG tablet TAKE 1 TABLET BY MOUTH ONCE DAILY   . Galcanezumab-gnlm (EMGALITY) 120 MG/ML SOSY Inject 120 mg into the skin every 30 (thirty) days.   . metoprolol succinate (TOPROL-XL) 50 MG 24 hr tablet TAKE 1 TABLET BY MOUTH ONCE DAILY WITH  OR  IMMEDIATELY  FOLLOWING  A  MEAL   . nitroGLYCERIN (NITROSTAT) 0.4 MG SL tablet Place 1 tablet (0.4 mg total) under the tongue every 5 (five) minutes as needed for chest pain.   . promethazine (PHENERGAN) 25 MG tablet TAKE ONE TABLET BY MOUTH EVERY 6 HOURS AS NEEDED FOR NAUSEA AND VOMITING   .  rizatriptan (MAXALT) 10 MG tablet TAKE 1 TABLET EARLIEST ONSET OF HEADACHE. MAY REPEAT ONCE IN 2 HOURS IF NEEDED   . SUMAtriptan (TOSYMRA) 10 MG/ACT SOLN Place 10 mg into the nose every hour as needed (Maximum 3 sprays/24 hours).   . topiramate (TOPAMAX) 100 MG tablet TAKE 1 TABLET BY MOUTH TWICE DAILY   . topiramate (TOPAMAX) 100 MG tablet Take 1 tablet by mouth twice daily   . triamcinolone ointment (KENALOG) 0.1 % Apply topically as needed 04/24/2016: Received from: External Pharmacy Received Sig:   . zolmitriptan (ZOMIG) 5 MG nasal solution Place 1 spray into the nose as needed for migraine.   . SUMAtriptan (TOSYMRA) 10 MG/ACT SOLN Place 1 spray into the nose once for 1 dose.    No facility-administered encounter medications on file as of 04/10/2019.    Observations/Objective: No vitals-- pt does not have the equipment to check  Pt in NAD   Assessment and Plan: 1. Viral syndrome  ?covid 19---- pt with mult symptoms that could be covid Pt advised to quarantine for 14 days  If inc sob / chest pain--- go to Er She will use pepto bismol for diarrhea and upset stomach  Follow Up Instructions:    I discussed the assessment and treatment plan with the patient. The patient  was provided an opportunity to ask questions and all were answered. The patient agreed with the plan and demonstrated an understanding of the instructions.   The patient was advised to call back or seek an in-person evaluation if the symptoms worsen or if the condition fails to improve as anticipated.  I provided 25 minutes of non-face-to-face time during this encounter.   [name], DO

## 2019-04-10 NOTE — Telephone Encounter (Signed)
Virtual visit scheduled.  

## 2019-04-17 ENCOUNTER — Other Ambulatory Visit: Payer: Medicare Other

## 2019-04-17 ENCOUNTER — Ambulatory Visit: Payer: Medicare Other

## 2019-04-25 ENCOUNTER — Telehealth: Payer: Medicare Other | Admitting: Nurse Practitioner

## 2019-04-25 ENCOUNTER — Encounter: Payer: Self-pay | Admitting: Family Medicine

## 2019-04-25 DIAGNOSIS — R05 Cough: Secondary | ICD-10-CM

## 2019-04-25 DIAGNOSIS — R599 Enlarged lymph nodes, unspecified: Secondary | ICD-10-CM

## 2019-04-25 DIAGNOSIS — R438 Other disturbances of smell and taste: Secondary | ICD-10-CM

## 2019-04-25 DIAGNOSIS — R519 Headache, unspecified: Secondary | ICD-10-CM

## 2019-04-25 DIAGNOSIS — J029 Acute pharyngitis, unspecified: Secondary | ICD-10-CM

## 2019-04-25 DIAGNOSIS — R059 Cough, unspecified: Secondary | ICD-10-CM

## 2019-04-25 NOTE — Progress Notes (Signed)
Based on what you shared with me it looks like you have an on going issue ,that should be evaluated in a face to face office visit. Possibly covid but could be something else. Not sure what at this point. But sounds like to me that need a work up. You can contact your PCP tomorrow or go too one of the places listed below.   NOTE: If you entered your credit card information for this eVisit, you will not be charged. You may see a "hold" on your card for the $30 but that hold will drop off and you will not have a charge processed.  If you are having a true medical emergency please call 911.  If you need an urgent face to face visit, [facility] has four urgent care centers for your convenience.  If you need care fast and have a high deductible or no insurance consider:   [email or web address] to reserve your spot online an avoid wait times  [facility] [address] [place], [place] [address] 8 am to 8 pm Monday-Friday 10 am to 4 pm Saturday-Sunday *Across the street from [employer]  [place], [address] 8 am to 5 pm Monday-Friday * In the [facility] on the [facility]   The following sites will take your  insurance:  . [facility] [place] a Provider at this Location  [address] [place], [place] [address] . 10 am to 8 pm Monday-Friday . 12 pm to 8 pm Saturday-Sunday   . [facility] at [place] a Provider at this Location  [address], [place], [place] [address] . 8 am to 8 pm Monday-Friday . 9 am to 6 pm Saturday . 11 am to 6 pm Sunday   . [facility] at [place] Get Driving Directions  [address] [place], [place] [address] . 8 am to 8 pm Monday-Friday . 8 am to 4 pm Saturday-Sunday   Your e-visit  answers were reviewed by a board certified advanced clinical practitioner to complete your personal care plan.

## 2019-04-26 ENCOUNTER — Other Ambulatory Visit: Payer: Self-pay

## 2019-04-26 ENCOUNTER — Ambulatory Visit (INDEPENDENT_AMBULATORY_CARE_PROVIDER_SITE_OTHER): Payer: Medicare Other | Admitting: Family Medicine

## 2019-04-26 ENCOUNTER — Encounter: Payer: Self-pay | Admitting: Family Medicine

## 2019-04-26 ENCOUNTER — Other Ambulatory Visit: Payer: Self-pay | Admitting: Family Medicine

## 2019-04-26 ENCOUNTER — Ambulatory Visit: Payer: Self-pay | Admitting: *Deleted

## 2019-04-26 DIAGNOSIS — R002 Palpitations: Secondary | ICD-10-CM

## 2019-04-26 DIAGNOSIS — Z20828 Contact with and (suspected) exposure to other viral communicable diseases: Secondary | ICD-10-CM

## 2019-04-26 DIAGNOSIS — B349 Viral infection, unspecified: Secondary | ICD-10-CM

## 2019-04-26 DIAGNOSIS — I10 Essential (primary) hypertension: Secondary | ICD-10-CM

## 2019-04-26 NOTE — Assessment & Plan Note (Signed)
Symptoms improving --- still has loose stools  No fever, no body aches + headaches but no sinus symptoms  Pt to call [facility] cov[facility] testing site in am

## 2019-04-26 NOTE — Telephone Encounter (Signed)
Pt requesting to be tested for covid-19.  she is c/o having a headache and a cough. She has not taking any medication for cough. She has has these symptoms along with body aches, GI upset, diarrhea, runny nose, chills a and fatigue. She denies having a fever.  She thinks it was exposed when doing errands for her father.  She voiced understanding of needing a virtual visit before a testing would be done. Most of her symptoms have been between the last 3 weeks per patient. Advised her to staying hydrated and staying safe. She voiced understanding. Notified LB at [facility] for an appointment. Routing to flow at [facility] for review.   Reason for Disposition . Cough present > 3 weeks  Answer Assessment - Initial Assessment Questions 1. COVID-19 DIAGNOSIS: "Who made your Coronavirus (COVID-19) diagnosis?" "Was it confirmed by a positive lab test?" If not diagnosed by a HCP, ask "Are there lots of cases (community spread) where you live?" (See public health department website, if unsure)   * MAJOR community spread: high number of cases; numbers of cases are increasing; many people hospitalized.   * MINOR community spread: low number of cases; not increasing; few or no people hospitalized     In the community 2. ONSET: "When did the COVID-19 symptoms start?"      About 3 weeks ago 3. WORST SYMPTOM: "What is your worst symptom?" (e.g., cough, fever, shortness of breath, muscle aches)     headache 4. COUGH: "Do you have a cough?" If so, ask: "How bad is the cough?"       Yes and it is not that bad right now (takes Topamax) but the cough has been worst 5. FEVER: "Do you have a fever?" If so, ask: "What is your temperature, how was it measured, and when did it start?"     No fever 6. RESPIRATORY STATUS: "Describe your breathing?" (e.g., shortness of breath, wheezing, unable to speak)      Had some shortness of breath right now 7. BETTER-SAME-WORSE: "Are you getting better, staying the same or  getting worse compared to yesterday?"  If getting worse, ask, "In what way?"     About the same, not feeling good 8. HIGH RISK DISEASE: "Do you have any chronic medical problems?" (e.g., asthma, heart or lung disease, weak immune system, etc.)     Heart failure in 2014?, chronic kidney disease 9. PREGNANCY: "Is there any chance you are pregnant?" "When was your last menstrual period?"     n/a 10. OTHER SYMPTOMS: "Do you have any other symptoms?"  (e.g., runny nose, headache, sore throat, loss of smell)       Had sore throat, runny nose, headaches, had metallic taste in her mouth in the last 3 weeks  Protocols used: CORONAVIRUS (COVID-19) DIAGNOSED OR SUSPECTED-A-AH

## 2019-04-26 NOTE — Progress Notes (Signed)
Virtual Visit via Video Note  I connected with Ebony Garcia on 04/26/19 at  3:20 PM EDT by a video enabled telemedicine application and verified that I am speaking with the correct person using two identifiers.  Location: Patient: home Provider: office    I discussed the limitations of evaluation and management by telemedicine and the availability of in person appointments. The patient expressed understanding and agreed to proceed.  History of Present Illness: Pt is calling to ask about covid 19 testing  She still has loose stools and headache.   Other symptoms have resolved   Past Medical History:  Diagnosis Date  . Cancer ([place])   . CHF (congestive heart failure) ([place])    dystolic heart dysfunction  . Chronic kidney disease   . Depression   . Depression with anxiety   . Diabetes mellitus without complication ([place])    Diet controlled  . Hypertension   . Intertrigo   . Migraines   . Panic attacks    Current Outpatient Medications on File Prior to Visit  Medication Sig Dispense Refill  . ALPRAZolam (XANAX) 1 MG tablet TAKE 1 TABLET BY MOUTH AT BEDTIME AS NEEDED FOR SLEEP 30 tablet 0  . estradiol (ESTRACE) 0.5 MG tablet Take 0.5 mg by mouth daily. 1/2 tab daily    . FLUoxetine (PROZAC) 20 MG capsule Take 1 capsule (20 mg total) by mouth daily. 90 capsule 3  . furosemide (LASIX) 20 MG tablet TAKE 1 TABLET BY MOUTH ONCE DAILY 90 tablet 1  . Galcanezumab-gnlm (EMGALITY) 120 MG/ML SOSY Inject 120 mg into the skin every 30 (thirty) days. 1 Syringe 11  . metoprolol succinate (TOPROL-XL) 50 MG 24 hr tablet TAKE 1 TABLET BY MOUTH ONCE DAILY WITH  OR  IMMEDIATELY  FOLLOWING  A  MEAL 90 tablet 1  . nitroGLYCERIN (NITROSTAT) 0.4 MG SL tablet Place 1 tablet (0.4 mg total) under the tongue every 5 (five) minutes as needed for chest pain. 25 tablet 3  . promethazine (PHENERGAN) 25 MG tablet TAKE ONE TABLET BY MOUTH EVERY 6 HOURS AS NEEDED FOR NAUSEA AND VOMITING 30 tablet 2  .  rizatriptan (MAXALT) 10 MG tablet TAKE 1 TABLET EARLIEST ONSET OF HEADACHE. MAY REPEAT ONCE IN 2 HOURS IF NEEDED 10 tablet 1  . SUMAtriptan (TOSYMRA) 10 MG/ACT SOLN Place 10 mg into the nose every hour as needed (Maximum 3 sprays/24 hours). 9 each 3  . topiramate (TOPAMAX) 100 MG tablet TAKE 1 TABLET BY MOUTH TWICE DAILY 60 tablet 5  . topiramate (TOPAMAX) 100 MG tablet Take 1 tablet by mouth twice daily 180 tablet 0  . triamcinolone ointment (KENALOG) 0.1 % Apply topically as needed    . zolmitriptan (ZOMIG) 5 MG nasal solution Place 1 spray into the nose as needed for migraine. 3 Units 5  . SUMAtriptan (TOSYMRA) 10 MG/ACT SOLN Place 1 spray into the nose once for 1 dose. 2 each 0   No current facility-administered medications on file prior to visit.     Observations/Objective: Today's Vitals   04/26/19 1512  BP: 136/78  Pulse: (!) 56  Weight: 168 lb (76.2 kg)  Height: 5\' 2"  (1.575 m)   Body mass index is 30.73 kg/m.   Assessment and Plan: .1. Viral syndrome ? covid Most of her symptoms have resolved -- still has loose stools and headache She will call [facility] testing [facility]  rto prn  Spent >30 min discussing covid symptoms and testing as well as antibody testing  >  50% was discussing above   Follow Up Instructions:    I discussed the assessment and treatment plan with the patient. The patient was provided an opportunity to ask questions and all were answered. The patient agreed with the plan and demonstrated an understanding of the instructions.   The patient was advised to call back or seek an in-person evaluation if the symptoms worsen or if the condition fails to improve as anticipated.  I provided 25 minutes of non-face-to-face time during this encounter.   [name], DO

## 2019-04-26 NOTE — Telephone Encounter (Signed)
Virtual visit scheduled.  

## 2019-04-27 ENCOUNTER — Ambulatory Visit: Payer: Medicare Other | Admitting: Neurology

## 2019-04-30 ENCOUNTER — Other Ambulatory Visit: Payer: Self-pay | Admitting: Family Medicine

## 2019-04-30 DIAGNOSIS — I5189 Other ill-defined heart diseases: Secondary | ICD-10-CM

## 2019-05-02 ENCOUNTER — Other Ambulatory Visit: Payer: Self-pay | Admitting: Family Medicine

## 2019-05-02 DIAGNOSIS — F419 Anxiety disorder, unspecified: Secondary | ICD-10-CM

## 2019-05-03 NOTE — Telephone Encounter (Signed)
Requesting: Xanax Contract: 12/04/2017 UDS: 11/2018, low risk Last OV: 04/26/2019 Next OV: 08/03/2019 Last Refill: 04/03/2019, #30--0 RF Database:   Please advise

## 2019-05-17 ENCOUNTER — Encounter: Payer: Self-pay | Admitting: Hematology & Oncology

## 2019-05-17 ENCOUNTER — Inpatient Hospital Stay: Payer: Medicare Other

## 2019-05-17 ENCOUNTER — Inpatient Hospital Stay: Payer: Medicare Other | Attending: Hematology & Oncology

## 2019-05-17 ENCOUNTER — Other Ambulatory Visit: Payer: Self-pay

## 2019-05-17 ENCOUNTER — Inpatient Hospital Stay (HOSPITAL_BASED_OUTPATIENT_CLINIC_OR_DEPARTMENT_OTHER): Payer: Medicare Other | Admitting: Hematology & Oncology

## 2019-05-17 VITALS — BP 134/72 | HR 50 | Temp 98.1°F | Resp 20 | Wt 170.4 lb

## 2019-05-17 DIAGNOSIS — Z79899 Other long term (current) drug therapy: Secondary | ICD-10-CM | POA: Insufficient documentation

## 2019-05-17 DIAGNOSIS — D696 Thrombocytopenia, unspecified: Secondary | ICD-10-CM | POA: Diagnosis not present

## 2019-05-17 DIAGNOSIS — D6959 Other secondary thrombocytopenia: Secondary | ICD-10-CM | POA: Diagnosis not present

## 2019-05-17 DIAGNOSIS — E538 Deficiency of other specified B group vitamins: Secondary | ICD-10-CM | POA: Insufficient documentation

## 2019-05-17 DIAGNOSIS — E8881 Metabolic syndrome: Secondary | ICD-10-CM

## 2019-05-17 LAB — CBC WITH DIFFERENTIAL (CANCER CENTER ONLY)
Abs Immature Granulocytes: 0.02 10*3/uL (ref 0.00–0.07)
Basophils Absolute: 0.1 10*3/uL (ref 0.0–0.1)
Basophils Relative: 1 %
Eosinophils Absolute: 0.2 10*3/uL (ref 0.0–0.5)
Eosinophils Relative: 3 %
HCT: 45.4 % (ref 36.0–46.0)
Hemoglobin: 14.2 g/dL (ref 12.0–15.0)
Immature Granulocytes: 0 %
Lymphocytes Relative: 31 %
Lymphs Abs: 2 10*3/uL (ref 0.7–4.0)
MCH: 30.2 pg (ref 26.0–34.0)
MCHC: 31.3 g/dL (ref 30.0–36.0)
MCV: 96.6 fL (ref 80.0–100.0)
Monocytes Absolute: 0.4 10*3/uL (ref 0.1–1.0)
Monocytes Relative: 6 %
Neutro Abs: 3.8 10*3/uL (ref 1.7–7.7)
Neutrophils Relative %: 59 %
Platelet Count: 62 10*3/uL — ABNORMAL LOW (ref 150–400)
RBC: 4.7 MIL/uL (ref 3.87–5.11)
RDW: 12.5 % (ref 11.5–15.5)
WBC Count: 6.5 10*3/uL (ref 4.0–10.5)
nRBC: 0 % (ref 0.0–0.2)

## 2019-05-17 LAB — CMP (CANCER CENTER ONLY)
ALT: 11 U/L (ref 0–44)
AST: 13 U/L — ABNORMAL LOW (ref 15–41)
Albumin: 4.4 g/dL (ref 3.5–5.0)
Alkaline Phosphatase: 78 U/L (ref 38–126)
Anion gap: 8 (ref 5–15)
BUN: 13 mg/dL (ref 8–23)
CO2: 25 mmol/L (ref 22–32)
Calcium: 9.3 mg/dL (ref 8.9–10.3)
Chloride: 108 mmol/L (ref 98–111)
Creatinine: 1.03 mg/dL — ABNORMAL HIGH (ref 0.44–1.00)
GFR, Est AFR Am: >60 mL/min (ref >60)
GFR, Estimated: 57 mL/min — ABNORMAL LOW (ref >60)
Glucose, Bld: 109 mg/dL — ABNORMAL HIGH (ref 70–99)
Potassium: 4.4 mmol/L (ref 3.5–5.1)
Sodium: 141 mmol/L (ref 135–145)
Total Bilirubin: 0.3 mg/dL (ref 0.3–1.2)
Total Protein: 7.1 g/dL (ref 6.5–8.1)

## 2019-05-17 LAB — VITAMIN B12: Vitamin B-12: 241 pg/mL (ref 180–914)

## 2019-05-17 LAB — PLATELET BY CITRATE

## 2019-05-17 MED ORDER — CYANOCOBALAMIN 1000 MCG/ML IJ SOLN
INTRAMUSCULAR | Status: AC
  Filled 2019-05-17: qty 1

## 2019-05-17 MED ORDER — CYANOCOBALAMIN 1000 MCG/ML IJ SOLN
1000.0000 ug | Freq: Once | INTRAMUSCULAR | Status: AC
  Administered 2019-05-17: 10:00:00 1000 ug via INTRAMUSCULAR

## 2019-05-17 NOTE — Progress Notes (Signed)
Hematology and Oncology Follow Up Visit  Ebony Garcia [id number] [birth year] 68 y.o. 05/17/2019   Principle Diagnosis:  Medication induced thrombocytopenia B 12 deficiency   Current Therapy:   Observation B 12 injection monthly   Interim History:  Ebony Garcia is here today for follow-up and B 12 injection.  She is really having a tough time.  There are a lot of family issues that she is trying to deal with.  Unfortunately, she has to deal with these.  There is just a lot of bad family dynamics that are going on right now.  She is still dealing with migraines.  She gets Botox injections.  She is on several medications for the migraines.  She has had no bleeding.  There is been no bruising.  She has had no nausea or vomiting.  She is overdue for her mammogram.  She said that she will talk to her obstetrician about this.  She has had no fever.  There is been no rashes.  She has had no mouth sores.  She had no cough.  Overall, her performance status is ECOG 1.   Medications:  Allergies as of 05/17/2019      Reactions   Amoxicillin-pot Clavulanate Diarrhea   Antihistamines, Diphenhydramine-type Other (See Comments)   Other Reaction: makes pt nervous   Diphenhydramine Hcl Other (See Comments)   Other Reaction: makes pt nervous   Amoxicillin-pot Clavulanate Diarrhea   Clindamycin    She has not taken but per patient  "her mother had a reaction and she does not want it ever"   Hydrocodone-acetaminophen    REACTION: sensitive to  but can take for extreme pain per patient   Nortriptyline    Propoxyphene Hcl Nausea Only   Propoxyphene N-acetaminophen Nausea Only   Sulfa Antibiotics    Unknown    Tramadol Other (See Comments)   Pt does know what kind of effect   Fremanezumab-vfrm Rash, Other (See Comments)   Seizure, jerking   Lisinopril    Restless , irregular sleep      Medication List       Accurate as of May 17, 2019 10:18 AM. If you have any questions, ask  your nurse or doctor.        ALPRAZolam 1 MG tablet Commonly known as:  XANAX TAKE 1 TABLET BY MOUTH AT BEDTIME AS NEEDED FOR SLEEP   estradiol 0.5 MG tablet Commonly known as:  ESTRACE Take 0.5 mg by mouth daily. 1/2 tab daily   FLUoxetine 20 MG capsule Commonly known as:  PROZAC Take 1 capsule (20 mg total) by mouth daily.   furosemide 20 MG tablet Commonly known as:  LASIX Take 1 tablet by mouth once daily   Galcanezumab-gnlm 120 MG/ML Sosy Commonly known as:  Emgality Inject 120 mg into the skin every 30 (thirty) days.   metoprolol succinate 50 MG 24 hr tablet Commonly known as:  TOPROL-XL TAKE 1 TABLET BY MOUTH ONCE DAILY WITH  OR  IMMEDIATELY  FOLLOWING  A  MEAL   nitroGLYCERIN 0.4 MG SL tablet Commonly known as:  NITROSTAT Place 1 tablet (0.4 mg total) under the tongue every 5 (five) minutes as needed for chest pain.   promethazine 25 MG tablet Commonly known as:  PHENERGAN TAKE ONE TABLET BY MOUTH EVERY 6 HOURS AS NEEDED FOR NAUSEA AND VOMITING   rizatriptan 10 MG tablet Commonly known as:  MAXALT TAKE 1 TABLET EARLIEST ONSET OF HEADACHE. MAY REPEAT ONCE IN 2 HOURS IF NEEDED  SUMAtriptan 10 MG/ACT Soln Commonly known as:  Tosymra Place 10 mg into the nose every hour as needed (Maximum 3 sprays/24 hours).   SUMAtriptan 10 MG/ACT Soln Commonly known as:  Tosymra Place 1 spray into the nose once for 1 dose.   topiramate 100 MG tablet Commonly known as:  TOPAMAX TAKE 1 TABLET BY MOUTH TWICE DAILY   topiramate 100 MG tablet Commonly known as:  TOPAMAX Take 1 tablet by mouth twice daily   triamcinolone ointment 0.1 % Commonly known as:  KENALOG Apply topically as needed   zolmitriptan 5 MG nasal solution Commonly known as:  Zomig Place 1 spray into the nose as needed for migraine.       Allergies:  Allergies  Allergen Reactions  . Amoxicillin-Pot Clavulanate Diarrhea  . Antihistamines, Diphenhydramine-Type Other (See Comments)    Other  Reaction: makes pt nervous  . Diphenhydramine Hcl Other (See Comments)    Other Reaction: makes pt nervous  . Amoxicillin-Pot Clavulanate Diarrhea  . Clindamycin     She has not taken but per patient  "her mother had a reaction and she does not want it ever"  . Hydrocodone-Acetaminophen     REACTION: sensitive to  but can take for extreme pain per patient  . Nortriptyline   . Propoxyphene Hcl Nausea Only  . Propoxyphene N-Acetaminophen Nausea Only  . Sulfa Antibiotics     Unknown   . Tramadol Other (See Comments)    Pt does know what kind of effect  . Fremanezumab-Vfrm Rash and Other (See Comments)    Seizure, jerking  . Lisinopril     Restless , irregular sleep    Past Medical History, Surgical history, Social history, and Family History were reviewed and updated.  Review of Systems: Review of Systems  Constitutional: Positive for malaise/fatigue.  HENT: Negative.   Eyes: Negative.   Respiratory: Negative.   Cardiovascular: Negative.   Gastrointestinal: Negative.   Genitourinary: Negative.   Musculoskeletal: Negative.   Skin: Negative.   Neurological: Positive for headaches.  Endo/Heme/Allergies: Negative.   Psychiatric/Behavioral: Negative.      Physical Exam:  weight is 170 lb 6.4 oz (77.3 kg). Her oral temperature is 98.1 F (36.7 C). Her blood pressure is 134/72 and her pulse is 50 (abnormal). Her respiration is 20 and oxygen saturation is 100%.   Wt Readings from Last 3 Encounters:  05/17/19 170 lb 6.4 oz (77.3 kg)  04/26/19 168 lb (76.2 kg)  04/10/19 170 lb (77.1 kg)    Physical Exam Vitals signs reviewed.  HENT:     Head: Normocephalic and atraumatic.  Eyes:     Pupils: Pupils are equal, round, and reactive to light.  Neck:     Musculoskeletal: Normal range of motion.  Cardiovascular:     Rate and Rhythm: Normal rate and regular rhythm.     Heart sounds: Normal heart sounds.  Pulmonary:     Effort: Pulmonary effort is normal.     Breath sounds:  Normal breath sounds.  Abdominal:     General: Bowel sounds are normal.     Palpations: Abdomen is soft.  Musculoskeletal: Normal range of motion.        General: No tenderness or deformity.  Lymphadenopathy:     Cervical: No cervical adenopathy.  Skin:    General: Skin is warm and dry.     Findings: No erythema or rash.  Neurological:     Mental Status: She is alert and oriented to person, place, and time.  Psychiatric:        Behavior: Behavior normal.        Thought Content: Thought content normal.        Judgment: Judgment normal.      Lab Results  Component Value Date   WBC 6.5 05/17/2019   HGB 14.2 05/17/2019   HCT 45.4 05/17/2019   MCV 96.6 05/17/2019   PLT 62 (L) 05/17/2019   No results found for: FERRITIN, IRON, TIBC, UIBC, IRONPCTSAT Lab Results  Component Value Date   RBC 4.70 05/17/2019   No results found for: KPAFRELGTCHN, LAMBDASER, KAPLAMBRATIO No results found for: IGGSERUM, IGA, IGMSERUM No results found for: Odetta Pink, SPEI   Chemistry      Component Value Date/Time   NA 141 05/17/2019 0913   NA 143 11/11/2017 1339   K 4.4 05/17/2019 0913   K 3.6 11/11/2017 1339   CL 108 05/17/2019 0913   CL 109 (H) 11/11/2017 1339   CO2 25 05/17/2019 0913   CO2 24 11/11/2017 1339   BUN 13 05/17/2019 0913   BUN 9 11/11/2017 1339   CREATININE 1.03 (H) 05/17/2019 0913   CREATININE 1.0 11/11/2017 1339      Component Value Date/Time   CALCIUM 9.3 05/17/2019 0913   CALCIUM 9.3 11/11/2017 1339   ALKPHOS 78 05/17/2019 0913   ALKPHOS 85 (H) 11/11/2017 1339   AST 13 (L) 05/17/2019 0913   ALT 11 05/17/2019 0913   ALT 24 11/11/2017 1339   BILITOT 0.3 05/17/2019 0913       Impression and Plan: Ebony Garcia is a pleasant 68 yo caucasian female with thrombocytopenia.  It is relatively stable right now.  It is hard to say if this is truly from medications or there is might be immune mediated.  She is  totally asymptomatic with this.  We will just watch for right now.  She will get her B12 shot today.  She will get this monthly.  I did have a good fellowship time with her.  We had a good prayer session.  I gave her a prayer blanket and a plan.  This really made her feel better.  We will plan to get her back in another 3 months.  If her platelet count keeps dropping, going may want to consider some intervention for this.   Volanda Napoleon, MD 6/10/202010:18 AM

## 2019-05-17 NOTE — Patient Instructions (Signed)
Cyanocobalamin, Vitamin B12 injection What is this medicine? CYANOCOBALAMIN (sye an oh koe BAL a min) is a man made form of vitamin B12. Vitamin B12 is used in the growth of healthy blood cells, nerve cells, and proteins in the body. It also helps with the metabolism of fats and carbohydrates. This medicine is used to treat people who can not absorb vitamin B12. This medicine may be used for other purposes; ask your health care provider or pharmacist if you have questions. COMMON BRAND NAME(S): B-12 Compliance Kit, B-12 Injection Kit, Cyomin, LA-12, Nutri-Twelve, Physicians EZ Use B-12, Primabalt What should I tell my health care provider before I take this medicine? They need to know if you have any of these conditions: -kidney disease -Leber's disease -megaloblastic anemia -an unusual or allergic reaction to cyanocobalamin, cobalt, other medicines, foods, dyes, or preservatives -pregnant or trying to get pregnant -breast-feeding How should I use this medicine? This medicine is injected into a muscle or deeply under the skin. It is usually given by a health care professional in a clinic or doctor's office. However, your doctor may teach you how to inject yourself. Follow all instructions. Talk to your pediatrician regarding the use of this medicine in children. Special care may be needed. Overdosage: If you think you have taken too much of this medicine contact a poison control center or emergency room at once. NOTE: This medicine is only for you. Do not share this medicine with others. What if I miss a dose? If you are given your dose at a clinic or doctor's office, call to reschedule your appointment. If you give your own injections and you miss a dose, take it as soon as you can. If it is almost time for your next dose, take only that dose. Do not take double or extra doses. What may interact with this medicine? -colchicine -heavy alcohol intake This list may not describe all possible  interactions. Give your health care provider a list of all the medicines, herbs, non-prescription drugs, or dietary supplements you use. Also tell them if you smoke, drink alcohol, or use illegal drugs. Some items may interact with your medicine. What should I watch for while using this medicine? Visit your doctor or health care professional regularly. You may need blood work done while you are taking this medicine. You may need to follow a special diet. Talk to your doctor. Limit your alcohol intake and avoid smoking to get the best benefit. What side effects may I notice from receiving this medicine? Side effects that you should report to your doctor or health care professional as soon as possible: -allergic reactions like skin rash, itching or hives, swelling of the face, lips, or tongue -blue tint to skin -chest tightness, pain -difficulty breathing, wheezing -dizziness -red, swollen painful area on the leg Side effects that usually do not require medical attention (report to your doctor or health care professional if they continue or are bothersome): -diarrhea -headache This list may not describe all possible side effects. Call your doctor for medical advice about side effects. You may report side effects to FDA at 1-800-FDA-1088. Where should I keep my medicine? Keep out of the reach of children. Store at room temperature between 15 and 30 degrees C (59 and 85 degrees F). Protect from light. Throw away any unused medicine after the expiration date. NOTE: This sheet is a summary. It may not cover all possible information. If you have questions about this medicine, talk to your doctor, pharmacist, or   health care provider.  2019 Elsevier/Gold Standard (2008-03-05 22:10:20)  

## 2019-05-31 ENCOUNTER — Other Ambulatory Visit: Payer: Self-pay | Admitting: Family Medicine

## 2019-05-31 DIAGNOSIS — F419 Anxiety disorder, unspecified: Secondary | ICD-10-CM

## 2019-06-02 ENCOUNTER — Telehealth: Payer: Self-pay | Admitting: Neurology

## 2019-06-02 DIAGNOSIS — G43709 Chronic migraine without aura, not intractable, without status migrainosus: Secondary | ICD-10-CM

## 2019-06-02 NOTE — Telephone Encounter (Signed)
Patient wants to know if we have sample of the emgality  She could get please call

## 2019-06-05 MED ORDER — EMGALITY 120 MG/ML ~~LOC~~ SOSY: 120.0000 mg | PREFILLED_SYRINGE | SUBCUTANEOUS | 0 refills | Status: DC

## 2019-06-05 NOTE — Telephone Encounter (Signed)
Emagality samples available.  Advised patient to come pick up. [id number] aa exp 07/2020.

## 2019-06-05 NOTE — Telephone Encounter (Signed)
Pt called regarding medication and is requesting an update. Pt states she has no medication for tonight. Please advise.

## 2019-06-05 NOTE — Telephone Encounter (Signed)
Alprazolam refill.   Last OV; 04/26/2019 Last Fill: 05/03/2019 #30 and 0RF UDS: 11/24/2017 Low risk

## 2019-06-11 NOTE — Progress Notes (Signed)
Virtual Visit via Telephone Note The purpose of this virtual visit is to provide medical care while limiting exposure to the novel coronavirus.    Consent was obtained for phone visit:  Yes Answered questions that patient had about telehealth interaction:  Yes I discussed the limitations, risks, security and privacy concerns of performing an evaluation and management service by telephone. I also discussed with the patient that there may be a patient responsible charge related to this service. The patient expressed understanding and agreed to proceed.  Pt location: Home Physician Location: office Name of referring provider:  [name], * I connected with .Ebony Garcia at patients initiation/request on 06/12/2019 at  2:30 PM EDT by telephone and verified that I am speaking with the correct person using two identifiers.  Pt MRN:  [id number] Pt DOB:  [birth year]  History of Present Illness:  Ebony Garcia is a 68 year old right-handed woman with hypertension, CHF, renal calculi, depression and hyperlipidemia who follows up for migraines.  UPDATE: Intensity:  Moderate to sevete Duration:  4-6 hours untreated.  15 minutes Tosymra. Frequency:  She wakes up with headaches daily but once she is up, they go away after about 4-6 hours. Over the past few months, she reports new headaches that are very severe, occurring most frequently.  On some occasions, she alomost Current NSAIDS:no Current analgesics: no. Current triptans: Tosymra (effective) Current anti-emetic: Phenergan Current muscle relaxants: no Current anti-anxiolytic: Xanax 1mg  Current sleep aide: Xanax 1mg  Current Antihypertensive medications: Toprol XL 50mg , Lasix Current Antidepressant medications: Prozac 20mg  Current Anticonvulsant medications: topiramate 100mg  BID Current anti-CGRP:  Emgality  Depression and anxiety: Yes She reports off and on body aches, paresthesias, head pains.  Self-quarantined.  Sedentary. She reports weight gain, which she attributes to the [employer] [place].    HISTORY: Onset:  Since her late 40s. Location: usually left sided, periorbital, and back of head.More recently, holocephalic Quality:Sharp, aching pressure.Non-throbbing Initial intensity:7/10; Sept: 7/10 Associated symptoms:  Nausea, photophobia and phonophobia.No visual disturbance, osmophobia, vomiting or unilateral numbness or weakness Aura:no Initial duration:3 days; Sept: 30 minutes with Maxalt Initial frequency:25 headache days per month Triggers:  Emotional stress Relieving factors:  Cold mask, heating pack on neck and shoulders  Past NSAIDs: naproxen (ineffective), indomethacin, toradol tablet (ineffective), Toradol 60mg  IM (effective), Cambia (side effects) Past analgesics: Tylenol (ineffective), Excedrin (ineffective), Lidocaine nasal drops (effective but caused hives), tramadol (side effects), Midrin (effective but made her sleepy) Past triptans/ergots: sumatriptan 100mg , Zomig 5mg  (effective), Frova, Relpax, DHE (effective), Maxalt (effective but unable to afford it), Zomig nasal spray (effective but can't get with her insurance because there is no co-pay card),  Past antiemetic: Reglan Past anxiolytics: Vicodin Past muscle relaxants: cyclobenzaprine Past antihypertensives: propranolol (ineffective), atenolol (ineffective), lisinopril, losartan, HCTZ Past antidepressants: amitriptyline (side effects), venlafaxine (ineffective), nortriptyline (effective but side effects such as rash), imipramine Past antiepileptics: Depakote (hair loss), lamotrigine (side effects), zonisamide (side effects), gabapentin, Keppra Past CGRP inhibitor: Aimovig (effective but concerned about side effects- ankle jerking, abnormal skin pattern), Ajovy (same side effects) Past vitamins and supplements: magnesium (ineffective), feverfew (ineffective) Other past therapy: Botox (one  round, flu like symptoms, unable to afford 20% copay), occipital nerve blocks, trigger point injections, alternating heat/ice  Family history of headache:Mother, grandmother, great-aunt, cousins.  08/16/08 MRI Brain w/wo performed for "explosive headaches": nonspecific punctate hyperintensities in the subcortical white matter. 08/16/08 MRA Head: Unremarkable.There is mild attenuation in the vertebrobasilar junction and proximal basilar artery which is likely artifact.  Past Medical History:  Past Medical History:  Diagnosis Date  . Cancer ([place])   . CHF (congestive heart failure) ([place])    dystolic heart dysfunction  . Chronic kidney disease   . Depression   . Depression with anxiety   . Diabetes mellitus without complication ([place])    Diet controlled  . Hypertension   . Intertrigo   . Migraines   . Panic attacks     Medications: Outpatient Encounter Medications as of 06/12/2019  Medication Sig Note  . ALPRAZolam (XANAX) 1 MG tablet TAKE 1 TABLET BY MOUTH AT BEDTIME AS NEEDED FOR SLEEP   . estradiol (ESTRACE) 0.5 MG tablet Take 0.5 mg by mouth daily. 1/2 tab daily   . FLUoxetine (PROZAC) 20 MG capsule Take 1 capsule (20 mg total) by mouth daily.   . furosemide (LASIX) 20 MG tablet Take 1 tablet by mouth once daily   . Galcanezumab-gnlm (EMGALITY) 120 MG/ML SOSY Inject 120 mg into the skin every 30 (thirty) days.   . metoprolol succinate (TOPROL-XL) 50 MG 24 hr tablet TAKE 1 TABLET BY MOUTH ONCE DAILY WITH  OR  IMMEDIATELY  FOLLOWING  A  MEAL   . nitroGLYCERIN (NITROSTAT) 0.4 MG SL tablet Place 1 tablet (0.4 mg total) under the tongue every 5 (five) minutes as needed for chest pain. (Patient not taking: Reported on 05/17/2019)   . promethazine (PHENERGAN) 25 MG tablet TAKE ONE TABLET BY MOUTH EVERY 6 HOURS AS NEEDED FOR NAUSEA AND VOMITING (Patient not taking: Reported on 05/17/2019)   . rizatriptan (MAXALT) 10 MG tablet TAKE 1 TABLET EARLIEST ONSET OF HEADACHE. MAY REPEAT ONCE IN 2  HOURS IF NEEDED (Patient not taking: Reported on 05/17/2019)   . SUMAtriptan (TOSYMRA) 10 MG/ACT SOLN Place 10 mg into the nose every hour as needed (Maximum 3 sprays/24 hours).   . SUMAtriptan (TOSYMRA) 10 MG/ACT SOLN Place 1 spray into the nose once for 1 dose.   . topiramate (TOPAMAX) 100 MG tablet TAKE 1 TABLET BY MOUTH TWICE DAILY   . topiramate (TOPAMAX) 100 MG tablet Take 1 tablet by mouth twice daily   . triamcinolone ointment (KENALOG) 0.1 % Apply topically as needed 04/24/2016: Received from: External Pharmacy Received Sig:   . zolmitriptan (ZOMIG) 5 MG nasal solution Place 1 spray into the nose as needed for migraine.    No facility-administered encounter medications on file as of 06/12/2019.     Allergies: Allergies  Allergen Reactions  . Amoxicillin-Pot Clavulanate Diarrhea  . Antihistamines, Diphenhydramine-Type Other (See Comments)    Other Reaction: makes pt nervous  . Diphenhydramine Hcl Other (See Comments)    Other Reaction: makes pt nervous  . Amoxicillin-Pot Clavulanate Diarrhea  . Clindamycin     She has not taken but per patient  "her mother had a reaction and she does not want it ever"  . Hydrocodone-Acetaminophen     REACTION: sensitive to  but can take for extreme pain per patient  . Nortriptyline   . Propoxyphene Hcl Nausea Only  . Propoxyphene N-Acetaminophen Nausea Only  . Sulfa Antibiotics     Unknown   . Tramadol Other (See Comments)    Pt does know what kind of effect  . Fremanezumab-Vfrm Rash and Other (See Comments)    Seizure, jerking  . Lisinopril     Restless , irregular sleep    Family History: Family History  Problem Relation Age of Onset  . Crohn's disease Mother   . Atrial fibrillation Mother   . Heart failure Mother   .  Bladder Cancer Father   . Cancer Father        bladder  . Bipolar disorder Brother   . Cancer Brother   . Bipolar disorder Sister   . Heart attack Maternal Grandmother        >65  . Cancer Maternal Aunt   .  Colon cancer Maternal Uncle   . Cancer Paternal Grandfather   . Cancer Maternal Aunt   . Cancer Cousin   . Anemia Cousin   . Diabetes Neg Hx   . Stroke Neg Hx     Social History: Social History   Socioeconomic History  . Marital status: Divorced    Spouse name: Not on file  . Number of children: 3  . Years of education: Not on file  . Highest education level: Not on file  Occupational History    Employer: UNEMPLOYED  Social Needs  . Financial resource strain: Not on file  . Food insecurity    Worry: Not on file    Inability: Not on file  . Transportation needs    Medical: Not on file    Non-medical: Not on file  Tobacco Use  . Smoking status: Never Smoker  . Smokeless tobacco: Never Used  Substance and Sexual Activity  . Alcohol use: No    Alcohol/week: 0.0 standard drinks  . Drug use: No  . Sexual activity: Not Currently    Partners: Male  Lifestyle  . Physical activity    Days per week: Not on file    Minutes per session: Not on file  . Stress: Not on file  Relationships  . Social Herbalist on phone: Not on file    Gets together: Not on file    Attends religious service: Not on file    Active member of club or organization: Not on file    Attends meetings of clubs or organizations: Not on file    Relationship status: Not on file  . Intimate partner violence    Fear of current or ex partner: Not on file    Emotionally abused: Not on file    Physically abused: Not on file    Forced sexual activity: Not on file  Other Topics Concern  . Not on file  Social History Narrative   Lives alone.            Observations/Objective:   Height 5\' 2"  (1.575 m), weight 170 lb (77.1 kg). No acute distress.  Alert and oriented.  Speech fluent and not dysarthric.  Language intact.  Eyes orthophoric on primary gaze.  Face symmetric.  Assessment and Plan:   1.  Chronic migraine without aura, without status migrainosus, not intractable.  Overall improved.    2.  New onset severe headaches.  I think it warrants further evaluation.  Check CTA of head for any obvious aneurysm, mass lesion or other intracranial abnormality.  1.  For preventative management, Emgality 2.  For abortive therapy, Tosymra 3.  Limit use of pain relievers to no more than 2 days out of week to prevent risk of rebound or medication-overuse headache. 4.  Keep headache diary 5.  Exercise, hydration, caffeine cessation, sleep hygiene, monitor for and avoid triggers 6.  Consider:  magnesium citrate 400mg  daily, riboflavin 400mg  daily, and coenzyme Q10 100mg  three times daily 7. Always keep in mind that currently taking a hormone or birth control may be a possible trigger or aggravating factor for migraine. 8. CTA of head. 9. Follow up  in 6 months   Follow Up Instructions:    -I discussed the assessment and treatment plan with the patient. The patient was provided an opportunity to ask questions and all were answered. The patient agreed with the plan and demonstrated an understanding of the instructions.   The patient was advised to call back or seek an in-person evaluation if the symptoms worsen or if the condition fails to improve as anticipated.    Total Time spent in visit with the patient was:  14 minutes   Dudley Major, DO

## 2019-06-12 ENCOUNTER — Telehealth (INDEPENDENT_AMBULATORY_CARE_PROVIDER_SITE_OTHER): Payer: Medicare Other | Admitting: Neurology

## 2019-06-12 ENCOUNTER — Encounter: Payer: Self-pay | Admitting: Neurology

## 2019-06-12 ENCOUNTER — Other Ambulatory Visit: Payer: Self-pay

## 2019-06-12 VITALS — Ht 62.0 in | Wt 170.0 lb

## 2019-06-12 DIAGNOSIS — G43709 Chronic migraine without aura, not intractable, without status migrainosus: Secondary | ICD-10-CM

## 2019-06-12 DIAGNOSIS — R519 Headache, unspecified: Secondary | ICD-10-CM

## 2019-06-12 NOTE — Addendum Note (Signed)
Addended by: Clois Comber on: 06/12/2019 03:55 PM   Modules accepted: Orders

## 2019-06-13 ENCOUNTER — Other Ambulatory Visit: Payer: Self-pay | Admitting: *Deleted

## 2019-06-13 DIAGNOSIS — D696 Thrombocytopenia, unspecified: Secondary | ICD-10-CM

## 2019-06-13 DIAGNOSIS — E538 Deficiency of other specified B group vitamins: Secondary | ICD-10-CM

## 2019-06-14 ENCOUNTER — Other Ambulatory Visit: Payer: Self-pay

## 2019-06-14 ENCOUNTER — Inpatient Hospital Stay: Payer: Medicare Other

## 2019-06-14 ENCOUNTER — Inpatient Hospital Stay: Payer: Medicare Other | Attending: Hematology & Oncology

## 2019-06-14 VITALS — BP 135/83 | HR 55 | Temp 97.5°F | Resp 17

## 2019-06-14 DIAGNOSIS — E538 Deficiency of other specified B group vitamins: Secondary | ICD-10-CM | POA: Insufficient documentation

## 2019-06-14 DIAGNOSIS — D696 Thrombocytopenia, unspecified: Secondary | ICD-10-CM

## 2019-06-14 LAB — CMP (CANCER CENTER ONLY)
ALT: 14 U/L (ref 0–44)
AST: 16 U/L (ref 15–41)
Albumin: 4.1 g/dL (ref 3.5–5.0)
Alkaline Phosphatase: 77 U/L (ref 38–126)
Anion gap: 9 (ref 5–15)
BUN: 19 mg/dL (ref 8–23)
CO2: 23 mmol/L (ref 22–32)
Calcium: 8.1 mg/dL — ABNORMAL LOW (ref 8.9–10.3)
Chloride: 107 mmol/L (ref 98–111)
Creatinine: 0.95 mg/dL (ref 0.44–1.00)
GFR, Est AFR Am: >60 mL/min (ref >60)
GFR, Estimated: >60 mL/min (ref >60)
Glucose, Bld: 146 mg/dL — ABNORMAL HIGH (ref 70–99)
Potassium: 4.3 mmol/L (ref 3.5–5.1)
Sodium: 139 mmol/L (ref 135–145)
Total Bilirubin: 0.3 mg/dL (ref 0.3–1.2)
Total Protein: 6.5 g/dL (ref 6.5–8.1)

## 2019-06-14 LAB — CBC WITH DIFFERENTIAL (CANCER CENTER ONLY)
Abs Immature Granulocytes: 0.03 10*3/uL (ref 0.00–0.07)
Basophils Absolute: 0.1 10*3/uL (ref 0.0–0.1)
Basophils Relative: 1 %
Eosinophils Absolute: 0.2 10*3/uL (ref 0.0–0.5)
Eosinophils Relative: 3 %
HCT: 42.2 % (ref 36.0–46.0)
Hemoglobin: 13.3 g/dL (ref 12.0–15.0)
Immature Granulocytes: 1 %
Lymphocytes Relative: 28 %
Lymphs Abs: 1.7 10*3/uL (ref 0.7–4.0)
MCH: 30.4 pg (ref 26.0–34.0)
MCHC: 31.5 g/dL (ref 30.0–36.0)
MCV: 96.3 fL (ref 80.0–100.0)
Monocytes Absolute: 0.4 10*3/uL (ref 0.1–1.0)
Monocytes Relative: 7 %
Neutro Abs: 3.7 10*3/uL (ref 1.7–7.7)
Neutrophils Relative %: 60 %
Platelet Count: 39 10*3/uL — ABNORMAL LOW (ref 150–400)
RBC: 4.38 MIL/uL (ref 3.87–5.11)
RDW: 13 % (ref 11.5–15.5)
WBC Count: 6 10*3/uL (ref 4.0–10.5)
nRBC: 0 % (ref 0.0–0.2)

## 2019-06-14 MED ORDER — CYANOCOBALAMIN 1000 MCG/ML IJ SOLN
INTRAMUSCULAR | Status: AC
  Filled 2019-06-14: qty 1

## 2019-06-14 MED ORDER — CYANOCOBALAMIN 1000 MCG/ML IJ SOLN
1000.0000 ug | Freq: Once | INTRAMUSCULAR | Status: AC
  Administered 2019-06-14: 1000 ug via INTRAMUSCULAR

## 2019-06-14 NOTE — Patient Instructions (Signed)

## 2019-06-21 ENCOUNTER — Encounter: Payer: Self-pay | Admitting: Family Medicine

## 2019-06-22 NOTE — Telephone Encounter (Signed)
Dr Marin Olp is the expert on this--- has she sat down to talk to him about it ?

## 2019-07-01 ENCOUNTER — Other Ambulatory Visit: Payer: Self-pay | Admitting: Neurology

## 2019-07-01 ENCOUNTER — Other Ambulatory Visit: Payer: Self-pay | Admitting: Family Medicine

## 2019-07-01 DIAGNOSIS — F419 Anxiety disorder, unspecified: Secondary | ICD-10-CM

## 2019-07-03 NOTE — Telephone Encounter (Signed)
[facility] @ [phone] left message on VM   They need a refill for the Topirmate sent to them

## 2019-07-04 NOTE — Telephone Encounter (Signed)
Requesting: Xanax Contract: 11/24/2017 UDS: 11/24/2017, low risk, next screen 11/2018 Last OV: 04/26/19 Next OV: 08/03/2019 Last Refill: 06/05/2019, #30--0 RF Database:   Please advise

## 2019-07-04 NOTE — Telephone Encounter (Signed)
Requested Prescriptions   Pending Prescriptions Disp Refills  . topiramate (TOPAMAX) 100 MG tablet [Pharmacy Med Name: Topiramate 100 MG Oral Tablet] 180 tablet 0    Sig: Take 1 tablet by mouth twice daily   Rx last filled: 04/03/19 #180 0 refills  Pt last seen: 06/12/19  Follow up appt scheduled:12/14/2019

## 2019-07-05 ENCOUNTER — Ambulatory Visit
Admission: RE | Admit: 2019-07-05 | Discharge: 2019-07-05 | Disposition: A | Discharge status: Home / Self Care | Payer: Medicare Other | Source: Ambulatory Visit | Attending: Neurology | Admitting: Neurology

## 2019-07-05 DIAGNOSIS — R51 Headache: Secondary | ICD-10-CM | POA: Diagnosis not present

## 2019-07-05 DIAGNOSIS — R519 Headache, unspecified: Secondary | ICD-10-CM

## 2019-07-05 MED ORDER — IOPAMIDOL (ISOVUE-370) INJECTION 76%
75.0000 mL | Freq: Once | INTRAVENOUS | Status: AC | PRN
  Administered 2019-07-05: 75 mL via INTRAVENOUS

## 2019-07-07 ENCOUNTER — Telehealth: Payer: Self-pay

## 2019-07-07 NOTE — Telephone Encounter (Signed)
-----   Message from Pieter Partridge, DO sent at 07/07/2019  7:07 AM EDT ----- CT looks okay.  Nothing concerning on exam to cause headaches (such as tumor or aneurysm)

## 2019-07-07 NOTE — Telephone Encounter (Signed)
Called and advised Pt of results

## 2019-07-18 ENCOUNTER — Other Ambulatory Visit: Payer: Self-pay | Admitting: *Deleted

## 2019-07-18 DIAGNOSIS — E538 Deficiency of other specified B group vitamins: Secondary | ICD-10-CM

## 2019-07-19 ENCOUNTER — Other Ambulatory Visit: Payer: Self-pay

## 2019-07-19 ENCOUNTER — Ambulatory Visit: Payer: Medicare Other | Admitting: Family

## 2019-07-19 ENCOUNTER — Inpatient Hospital Stay: Payer: Medicare Other | Attending: Hematology & Oncology

## 2019-07-19 ENCOUNTER — Inpatient Hospital Stay: Payer: Medicare Other

## 2019-07-19 VITALS — BP 129/68 | HR 56 | Temp 97.5°F | Resp 19

## 2019-07-19 DIAGNOSIS — E538 Deficiency of other specified B group vitamins: Secondary | ICD-10-CM | POA: Insufficient documentation

## 2019-07-19 DIAGNOSIS — D696 Thrombocytopenia, unspecified: Secondary | ICD-10-CM | POA: Diagnosis not present

## 2019-07-19 DIAGNOSIS — Z79899 Other long term (current) drug therapy: Secondary | ICD-10-CM | POA: Diagnosis not present

## 2019-07-19 LAB — CBC WITH DIFFERENTIAL (CANCER CENTER ONLY)
Abs Immature Granulocytes: 0.02 10*3/uL (ref 0.00–0.07)
Basophils Absolute: 0.1 10*3/uL (ref 0.0–0.1)
Basophils Relative: 1 %
Eosinophils Absolute: 0.2 10*3/uL (ref 0.0–0.5)
Eosinophils Relative: 3 %
HCT: 41.8 % (ref 36.0–46.0)
Hemoglobin: 13.1 g/dL (ref 12.0–15.0)
Immature Granulocytes: 0 %
Lymphocytes Relative: 19 %
Lymphs Abs: 1.2 10*3/uL (ref 0.7–4.0)
MCH: 29.7 pg (ref 26.0–34.0)
MCHC: 31.3 g/dL (ref 30.0–36.0)
MCV: 94.8 fL (ref 80.0–100.0)
Monocytes Absolute: 0.5 10*3/uL (ref 0.1–1.0)
Monocytes Relative: 8 %
Neutro Abs: 4.3 10*3/uL (ref 1.7–7.7)
Neutrophils Relative %: 69 %
Platelet Count: 36 10*3/uL — ABNORMAL LOW (ref 150–400)
RBC: 4.41 MIL/uL (ref 3.87–5.11)
RDW: 12.9 % (ref 11.5–15.5)
WBC Count: 6.3 10*3/uL (ref 4.0–10.5)
nRBC: 0 % (ref 0.0–0.2)

## 2019-07-19 LAB — COMPREHENSIVE METABOLIC PANEL
ALT: 10 U/L (ref 0–44)
AST: 12 U/L — ABNORMAL LOW (ref 15–41)
Albumin: 4 g/dL (ref 3.5–5.0)
Alkaline Phosphatase: 81 U/L (ref 38–126)
Anion gap: 7 (ref 5–15)
BUN: 12 mg/dL (ref 8–23)
CO2: 25 mmol/L (ref 22–32)
Calcium: 8.7 mg/dL — ABNORMAL LOW (ref 8.9–10.3)
Chloride: 108 mmol/L (ref 98–111)
Creatinine, Ser: 1.04 mg/dL — ABNORMAL HIGH (ref 0.44–1.00)
GFR calc Af Amer: >60 mL/min (ref >60)
GFR calc non Af Amer: 56 mL/min — ABNORMAL LOW (ref >60)
Glucose, Bld: 121 mg/dL — ABNORMAL HIGH (ref 70–99)
Potassium: 4.3 mmol/L (ref 3.5–5.1)
Sodium: 140 mmol/L (ref 135–145)
Total Bilirubin: 0.4 mg/dL (ref 0.3–1.2)
Total Protein: 6.2 g/dL — ABNORMAL LOW (ref 6.5–8.1)

## 2019-07-19 MED ORDER — CYANOCOBALAMIN 1000 MCG/ML IJ SOLN
INTRAMUSCULAR | Status: AC
  Filled 2019-07-19: qty 1

## 2019-07-19 MED ORDER — CYANOCOBALAMIN 1000 MCG/ML IJ SOLN
1000.0000 ug | Freq: Once | INTRAMUSCULAR | Status: AC
  Administered 2019-07-19: 1000 ug via INTRAMUSCULAR

## 2019-07-19 NOTE — Patient Instructions (Signed)

## 2019-07-25 ENCOUNTER — Other Ambulatory Visit: Payer: Self-pay | Admitting: Family Medicine

## 2019-07-25 DIAGNOSIS — I10 Essential (primary) hypertension: Secondary | ICD-10-CM

## 2019-07-25 DIAGNOSIS — R002 Palpitations: Secondary | ICD-10-CM

## 2019-07-28 ENCOUNTER — Other Ambulatory Visit: Payer: Self-pay

## 2019-07-28 MED ORDER — TOPIRAMATE 100 MG PO TABS: 100.0000 mg | ORAL_TABLET | Freq: Two times a day (BID) | ORAL | 1 refills | Status: DC

## 2019-08-03 ENCOUNTER — Ambulatory Visit (INDEPENDENT_AMBULATORY_CARE_PROVIDER_SITE_OTHER): Payer: Medicare Other | Admitting: Family Medicine

## 2019-08-03 ENCOUNTER — Encounter: Payer: Self-pay | Admitting: Family Medicine

## 2019-08-03 ENCOUNTER — Other Ambulatory Visit: Payer: Self-pay

## 2019-08-03 VITALS — BP 110/80 | HR 56 | Temp 97.2°F | Resp 18 | Ht 62.0 in | Wt 176.6 lb

## 2019-08-03 DIAGNOSIS — F419 Anxiety disorder, unspecified: Secondary | ICD-10-CM

## 2019-08-03 DIAGNOSIS — I10 Essential (primary) hypertension: Secondary | ICD-10-CM

## 2019-08-03 DIAGNOSIS — E785 Hyperlipidemia, unspecified: Secondary | ICD-10-CM | POA: Diagnosis not present

## 2019-08-03 DIAGNOSIS — I5031 Acute diastolic (congestive) heart failure: Secondary | ICD-10-CM | POA: Diagnosis not present

## 2019-08-03 DIAGNOSIS — Z23 Encounter for immunization: Secondary | ICD-10-CM | POA: Diagnosis not present

## 2019-08-03 DIAGNOSIS — F418 Other specified anxiety disorders: Secondary | ICD-10-CM

## 2019-08-03 DIAGNOSIS — I5189 Other ill-defined heart diseases: Secondary | ICD-10-CM | POA: Diagnosis not present

## 2019-08-03 DIAGNOSIS — R110 Nausea: Secondary | ICD-10-CM

## 2019-08-03 LAB — LIPID PANEL
Cholesterol: 201 mg/dL — ABNORMAL HIGH (ref 0–200)
HDL: 48.1 mg/dL (ref >39.00)
LDL Cholesterol: 119 mg/dL — ABNORMAL HIGH (ref 0–99)
NonHDL: 153.3
Total CHOL/HDL Ratio: 4
Triglycerides: 172 mg/dL — ABNORMAL HIGH (ref 0.0–149.0)
VLDL: 34.4 mg/dL (ref 0.0–40.0)

## 2019-08-03 LAB — COMPREHENSIVE METABOLIC PANEL
ALT: 11 U/L (ref 0–35)
AST: 14 U/L (ref 0–37)
Albumin: 4.3 g/dL (ref 3.5–5.2)
Alkaline Phosphatase: 72 U/L (ref 39–117)
BUN: 9 mg/dL (ref 6–23)
CO2: 23 mEq/L (ref 19–32)
Calcium: 8.8 mg/dL (ref 8.4–10.5)
Chloride: 110 mEq/L (ref 96–112)
Creatinine, Ser: 1.02 mg/dL (ref 0.40–1.20)
GFR: 54.24 mL/min — ABNORMAL LOW (ref >60.00)
Glucose, Bld: 86 mg/dL (ref 70–99)
Potassium: 3.3 mEq/L — ABNORMAL LOW (ref 3.5–5.1)
Sodium: 142 mEq/L (ref 135–145)
Total Bilirubin: 0.4 mg/dL (ref 0.2–1.2)
Total Protein: 6.6 g/dL (ref 6.0–8.3)

## 2019-08-03 MED ORDER — ALPRAZOLAM 1 MG PO TABS: 1.0000 mg | ORAL_TABLET | Freq: Every evening | ORAL | 0 refills | Status: DC | PRN

## 2019-08-03 MED ORDER — FUROSEMIDE 20 MG PO TABS: 20.0000 mg | ORAL_TABLET | Freq: Every day | ORAL | 0 refills | Status: DC

## 2019-08-03 MED ORDER — PROMETHAZINE HCL 25 MG PO TABS: ORAL_TABLET | ORAL | 2 refills | Status: DC

## 2019-08-03 MED ORDER — FLUOXETINE HCL 20 MG PO CAPS: 20.0000 mg | ORAL_CAPSULE | Freq: Every day | ORAL | 3 refills | Status: DC

## 2019-08-03 NOTE — Assessment & Plan Note (Signed)
hgba1c acceptable, minimize simple carbs. Increase exercise as tolerated. Continue current meds 

## 2019-08-03 NOTE — Progress Notes (Signed)
Patient ID: Ebony Garcia, female    DOB: 09/01/1951  Age: 68 y.o. MRN: [id number]    Subjective:  Subjective  HPI Ebony Garcia presents for f/u and refills.   No new complaints.    Review of Systems  Constitutional: Negative for chills and fever.  HENT: Negative for congestion and hearing loss.   Eyes: Negative for discharge.  Respiratory: Negative for cough and shortness of breath.   Cardiovascular: Negative for chest pain, palpitations and leg swelling.  Gastrointestinal: Negative for abdominal pain, blood in stool, constipation, diarrhea, nausea and vomiting.  Genitourinary: Negative for dysuria, frequency, hematuria and urgency.  Musculoskeletal: Negative for back pain and myalgias.  Skin: Negative for rash.  Allergic/Immunologic: Negative for environmental allergies.  Neurological: Negative for dizziness, weakness and headaches.  Hematological: Does not bruise/bleed easily.  Psychiatric/Behavioral: Negative for suicidal ideas. The patient is not nervous/anxious.     History Past Medical History:  Diagnosis Date  . Cancer ([place])   . CHF (congestive heart failure) ([place])    dystolic heart dysfunction  . Chronic kidney disease   . Depression   . Depression with anxiety   . Diabetes mellitus without complication ([place])    Diet controlled  . Hypertension   . Intertrigo   . Migraines   . Panic attacks     She has a past surgical history that includes Total abdominal hysterectomy w/ bilateral salpingoophorectomy; Tonsillectomy and adenoidectomy; Nasal septum surgery; Tubal ligation; Dilation and curettage of uterus; papaloma (Right); Breast surgery; and Abdominal hysterectomy.   Her family history includes Anemia in her cousin; Atrial fibrillation in her mother; Bipolar disorder in her brother and sister; Bladder Cancer in her father; Cancer in her brother, cousin, father, maternal aunt, maternal aunt, and paternal grandfather; Colon cancer in her maternal uncle;  Crohn's disease in her mother; Heart attack in her maternal grandmother; Heart failure in her mother.She reports that she has never smoked. She has never used smokeless tobacco. She reports that she does not drink alcohol or use drugs.  Current Outpatient Medications on File Prior to Visit  Medication Sig Dispense Refill  . Galcanezumab-gnlm (EMGALITY) 120 MG/ML SOSY Inject 120 mg into the skin every 30 (thirty) days. 120 mL 0  . metoprolol succinate (TOPROL-XL) 50 MG 24 hr tablet TAKE 1 TABLET BY MOUTH ONCE DAILY OR  IMMEDIATELY  FOLLOWING  A  MEAL 90 tablet 1  . nitroGLYCERIN (NITROSTAT) 0.4 MG SL tablet Place 1 tablet (0.4 mg total) under the tongue every 5 (five) minutes as needed for chest pain. 25 tablet 3  . SUMAtriptan (TOSYMRA) 10 MG/ACT SOLN Place 10 mg into the nose every hour as needed (Maximum 3 sprays/24 hours). (Patient taking differently: Place 10 mg into the nose every hour as needed (Maximum 3 sprays/24 hours). Uses when samples are provided) 9 each 3  . topiramate (TOPAMAX) 100 MG tablet Take 1 tablet by mouth twice daily 180 tablet 0  . topiramate (TOPAMAX) 100 MG tablet Take 1 tablet (100 mg total) by mouth 2 (two) times daily. 180 tablet 1  . triamcinolone ointment (KENALOG) 0.1 % Apply topically as needed    . zolmitriptan (ZOMIG) 5 MG nasal solution Place 1 spray into the nose as needed for migraine. 3 Units 5   No current facility-administered medications on file prior to visit.      Objective:  Objective  Physical Exam Vitals signs and nursing note reviewed.  Constitutional:      General: She is not in  acute distress.    Appearance: She is well-developed.  HENT:     Right Ear: External ear normal.     Left Ear: External ear normal.     Nose: Nose normal.  Eyes:     Pupils: Pupils are equal, round, and reactive to light.  Neck:     Musculoskeletal: Normal range of motion and neck supple.  Cardiovascular:     Rate and Rhythm: Normal rate and regular rhythm.      Heart sounds: Normal heart sounds. No murmur.  Pulmonary:     Effort: Pulmonary effort is normal. No respiratory distress.     Breath sounds: Normal breath sounds. No wheezing or rales.  Chest:     Chest wall: No tenderness.  Neurological:     Mental Status: She is alert and oriented to person, place, and time.  Psychiatric:        Behavior: Behavior normal.        Thought Content: Thought content normal.        Judgment: Judgment normal.    BP 110/80 (BP Location: Right Arm, Patient Position: Sitting, Cuff Size: Normal)   Pulse (!) 56   Temp (!) 97.2 F (36.2 C) (Temporal)   Resp 18   Ht 5\' 2"  (1.575 m)   Wt 176 lb 9.6 oz (80.1 kg)   SpO2 98%   BMI 32.30 kg/m  Wt Readings from Last 3 Encounters:  08/03/19 176 lb 9.6 oz (80.1 kg)  06/12/19 170 lb (77.1 kg)  05/17/19 170 lb 6.4 oz (77.3 kg)     Lab Results  Component Value Date   WBC 6.3 07/19/2019   HGB 13.1 07/19/2019   HCT 41.8 07/19/2019   PLT 36 (L) 07/19/2019   GLUCOSE 121 (H) 07/19/2019   CHOL 203 (H) 09/20/2017   TRIG 201.0 (H) 09/20/2017   HDL 43.90 09/20/2017   LDLDIRECT 121.0 09/20/2017   LDLCALC 80 05/14/2017   ALT 10 07/19/2019   AST 12 (L) 07/19/2019   NA 140 07/19/2019   K 4.3 07/19/2019   CL 108 07/19/2019   CREATININE 1.04 (H) 07/19/2019   BUN 12 07/19/2019   CO2 25 07/19/2019   TSH 3.62 11/03/2016   INR 0.93 07/19/2010   HGBA1C 5.9 12/08/2012   MICROALBUR 1.1 09/20/2017    Ct Angio Head W Or Wo Contrast  Result Date: 07/06/2019 CLINICAL DATA:  New onset of headaches after age 35. New onset headache over 50, evaluate for aneurysm. Additional history: Patient "off balance" , blurred vision, dizzy EXAM: CT ANGIOGRAPHY HEAD TECHNIQUE: Multidetector CT imaging of the head was performed using the standard protocol during bolus administration of intravenous contrast. Multiplanar CT image reconstructions and MIPs were obtained to evaluate the vascular anatomy. CONTRAST:  15mL ISOVUE-370 IOPAMIDOL  (ISOVUE-370) INJECTION 76% COMPARISON:  Brain MRI 08/27/2015 FINDINGS: CT HEAD Brain: There is no acute intracranial hemorrhage or demarcated territorial infarction.No evidence of intracranial mass.No midline shift or extra-axial collection. Cerebral volume is age appropriate. Vascular: Reported separately Skull: Normal. Negative for fracture or focal lesion. Sinuses: No significant paranasal sinus disease or mastoid effusion. Orbits: The imaged globes and orbits are unremarkable. CTA HEAD Anterior circulation: No large vessel occlusion or proximal high-grade arterial stenosis.No intracranial aneurysm is identified. Posterior circulation: No large vessel occlusion or proximal high-grade arterial stenosis.No intracranial aneurysm is identified. Venous sinuses: Within the limitations of contrast timing, no evidence of thrombosis. Anatomic variants: Diminutive intracranial left vertebral artery beyond the left PICA origin. Predominantly fetal origin of  the bilateral posterior cerebral arteries. IMPRESSION: CT HEAD: Unremarkable non-contrast CT appearance of the brain. No evidence of intracranial mass or acute intracranial abnormality. CTA HEAD: No large vessel occlusion or proximal high-grade arterial stenosis. No intracranial aneurysm is identified. Electronically Signed   By: Kellie Simmering   On: 07/06/2019 10:33     Assessment & Plan:  Plan  I have discontinued Ebony Garcia's rizatriptan. I have also changed her ALPRAZolam and furosemide. Additionally, I am having her maintain her nitroGLYCERIN, triamcinolone ointment, zolmitriptan, SUMAtriptan, Emgality, topiramate, metoprolol succinate, topiramate, FLUoxetine, and promethazine.  Meds ordered this encounter  Medications  . ALPRAZolam (XANAX) 1 MG tablet    Sig: Take 1 tablet (1 mg total) by mouth at bedtime as needed. for sleep    Dispense:  30 tablet    Refill:  0  . FLUoxetine (PROZAC) 20 MG capsule    Sig: Take 1 capsule (20 mg total) by mouth  daily.    Dispense:  90 capsule    Refill:  3    Please consider 90 day supplies to promote better adherence  . furosemide (LASIX) 20 MG tablet    Sig: Take 1 tablet (20 mg total) by mouth daily.    Dispense:  90 tablet    Refill:  0  . promethazine (PHENERGAN) 25 MG tablet    Sig: TAKE ONE TABLET BY MOUTH EVERY 6 HOURS AS NEEDED FOR NAUSEA AND VOMITING    Dispense:  30 tablet    Refill:  2    Please consider 90 day supplies to promote better adherence    Problem List Items Addressed This Visit      Unprioritized   Acute diastolic CHF (congestive heart failure) (H[facility])    Stable  Per cardiology      Relevant Medications   furosemide (LASIX) 20 MG tablet   Anxiety   Relevant Medications   ALPRAZolam (XANAX) 1 MG tablet   FLUoxetine (PROZAC) 20 MG capsule   Depression with anxiety    Stable con't meds      Relevant Medications   ALPRAZolam (XANAX) 1 MG tablet   FLUoxetine (PROZAC) 20 MG capsule   Essential hypertension - Primary    Well controlled, no changes to meds. Encouraged heart healthy diet such as the DASH diet and exercise as tolerated.       Relevant Medications   furosemide (LASIX) 20 MG tablet   Other Relevant Orders   Lipid panel   Comprehensive metabolic panel   Hyperlipidemia    hgba1c acceptable, minimize simple carbs. Increase exercise as tolerated. Continue current meds      Relevant Medications   furosemide (LASIX) 20 MG tablet    Other Visit Diagnoses    Diastolic dysfunction       Relevant Medications   furosemide (LASIX) 20 MG tablet   Nausea without vomiting       Relevant Medications   promethazine (PHENERGAN) 25 MG tablet   Need for influenza vaccination       Relevant Orders   Flu Vaccine QUAD High Dose(Fluad) (Completed)   Need for pneumococcal vaccination       Relevant Orders   Pneumococcal conjugate vaccine 13-valent IM (Completed)      Follow-up: Return in about 6 months (around 02/03/2020), or if symptoms worsen or fail  to improve.  [name], DO

## 2019-08-03 NOTE — Assessment & Plan Note (Signed)
Well controlled, no changes to meds. Encouraged heart healthy diet such as the DASH diet and exercise as tolerated.  °

## 2019-08-03 NOTE — Assessment & Plan Note (Signed)
Stable Per cardiology 

## 2019-08-03 NOTE — Assessment & Plan Note (Signed)
Stable con't meds 

## 2019-08-06 ENCOUNTER — Other Ambulatory Visit: Payer: Self-pay | Admitting: Family Medicine

## 2019-08-06 DIAGNOSIS — E785 Hyperlipidemia, unspecified: Secondary | ICD-10-CM

## 2019-08-10 ENCOUNTER — Telehealth: Payer: Self-pay | Admitting: Nurse Practitioner

## 2019-08-10 DIAGNOSIS — Z1231 Encounter for screening mammogram for malignant neoplasm of breast: Secondary | ICD-10-CM | POA: Diagnosis not present

## 2019-08-10 NOTE — Telephone Encounter (Signed)
LMVM regarding time/date change for 9/9 appts / provider PAL

## 2019-08-15 ENCOUNTER — Telehealth: Payer: Self-pay | Admitting: Neurology

## 2019-08-15 NOTE — Telephone Encounter (Signed)
Patient called to check in and see if there are any Emgality samples available for her. She said she is about due for an injection.

## 2019-08-16 ENCOUNTER — Ambulatory Visit: Payer: Medicare Other

## 2019-08-16 ENCOUNTER — Other Ambulatory Visit: Payer: Medicare Other

## 2019-08-16 ENCOUNTER — Ambulatory Visit: Payer: Medicare Other | Admitting: Family

## 2019-08-16 NOTE — Telephone Encounter (Signed)
Called patient sample in fridge with her name on it. Pt aware to pick up when she can. She will be by the office to pick up tomorrow or friday

## 2019-08-18 ENCOUNTER — Inpatient Hospital Stay: Payer: Medicare Other | Attending: Hematology & Oncology

## 2019-08-18 ENCOUNTER — Inpatient Hospital Stay (HOSPITAL_BASED_OUTPATIENT_CLINIC_OR_DEPARTMENT_OTHER): Payer: Medicare Other | Admitting: Family

## 2019-08-18 ENCOUNTER — Encounter: Payer: Self-pay | Admitting: Family

## 2019-08-18 ENCOUNTER — Inpatient Hospital Stay: Payer: Medicare Other

## 2019-08-18 ENCOUNTER — Telehealth: Payer: Self-pay

## 2019-08-18 ENCOUNTER — Other Ambulatory Visit: Payer: Self-pay

## 2019-08-18 ENCOUNTER — Telehealth: Payer: Self-pay | Admitting: Family

## 2019-08-18 VITALS — BP 149/80 | HR 63 | Temp 98.0°F | Resp 18 | Ht 62.0 in | Wt 177.0 lb

## 2019-08-18 DIAGNOSIS — E538 Deficiency of other specified B group vitamins: Secondary | ICD-10-CM

## 2019-08-18 DIAGNOSIS — D696 Thrombocytopenia, unspecified: Secondary | ICD-10-CM

## 2019-08-18 DIAGNOSIS — D6959 Other secondary thrombocytopenia: Secondary | ICD-10-CM | POA: Diagnosis not present

## 2019-08-18 DIAGNOSIS — E8881 Metabolic syndrome: Secondary | ICD-10-CM

## 2019-08-18 LAB — CBC WITH DIFFERENTIAL (CANCER CENTER ONLY)
Abs Immature Granulocytes: 0.02 10*3/uL (ref 0.00–0.07)
Basophils Absolute: 0.1 10*3/uL (ref 0.0–0.1)
Basophils Relative: 1 %
Eosinophils Absolute: 0.2 10*3/uL (ref 0.0–0.5)
Eosinophils Relative: 4 %
HCT: 43.4 % (ref 36.0–46.0)
Hemoglobin: 13.8 g/dL (ref 12.0–15.0)
Immature Granulocytes: 0 %
Lymphocytes Relative: 24 %
Lymphs Abs: 1.6 10*3/uL (ref 0.7–4.0)
MCH: 30.5 pg (ref 26.0–34.0)
MCHC: 31.8 g/dL (ref 30.0–36.0)
MCV: 95.8 fL (ref 80.0–100.0)
Monocytes Absolute: 0.5 10*3/uL (ref 0.1–1.0)
Monocytes Relative: 7 %
Neutro Abs: 4.3 10*3/uL (ref 1.7–7.7)
Neutrophils Relative %: 64 %
Platelet Count: 54 10*3/uL — ABNORMAL LOW (ref 150–400)
RBC: 4.53 MIL/uL (ref 3.87–5.11)
RDW: 13 % (ref 11.5–15.5)
WBC Count: 6.8 10*3/uL (ref 4.0–10.5)
nRBC: 0 % (ref 0.0–0.2)

## 2019-08-18 LAB — CMP (CANCER CENTER ONLY)
ALT: 14 U/L (ref 0–44)
AST: 15 U/L (ref 15–41)
Albumin: 4.3 g/dL (ref 3.5–5.0)
Alkaline Phosphatase: 77 U/L (ref 38–126)
Anion gap: 8 (ref 5–15)
BUN: 10 mg/dL (ref 8–23)
CO2: 23 mmol/L (ref 22–32)
Calcium: 9.3 mg/dL (ref 8.9–10.3)
Chloride: 108 mmol/L (ref 98–111)
Creatinine: 1.07 mg/dL — ABNORMAL HIGH (ref 0.44–1.00)
GFR, Est AFR Am: >60 mL/min (ref >60)
GFR, Estimated: 54 mL/min — ABNORMAL LOW (ref >60)
Glucose, Bld: 128 mg/dL — ABNORMAL HIGH (ref 70–99)
Potassium: 3.7 mmol/L (ref 3.5–5.1)
Sodium: 139 mmol/L (ref 135–145)
Total Bilirubin: 0.4 mg/dL (ref 0.3–1.2)
Total Protein: 6.8 g/dL (ref 6.5–8.1)

## 2019-08-18 LAB — PLATELET BY CITRATE

## 2019-08-18 LAB — VITAMIN B12: Vitamin B-12: 340 pg/mL (ref 180–914)

## 2019-08-18 LAB — SAVE SMEAR(SSMR), FOR PROVIDER SLIDE REVIEW

## 2019-08-18 MED ORDER — CYANOCOBALAMIN 1000 MCG/ML IJ SOLN
1000.0000 ug | Freq: Once | INTRAMUSCULAR | Status: AC
  Administered 2019-08-18: 1000 ug via INTRAMUSCULAR

## 2019-08-18 MED ORDER — CYANOCOBALAMIN 1000 MCG/ML IJ SOLN
INTRAMUSCULAR | Status: AC
  Filled 2019-08-18: qty 1

## 2019-08-18 MED ORDER — EMGALITY 120 MG/ML ~~LOC~~ SOAJ: 120.0000 mg | SUBCUTANEOUS | 0 refills | Status: DC

## 2019-08-18 NOTE — Patient Instructions (Signed)

## 2019-08-18 NOTE — Telephone Encounter (Signed)
Called and notified patient of appointments added per 9/11 los

## 2019-08-18 NOTE — Telephone Encounter (Signed)
Pt picked up 1 box of Emgality today #2pens in box. Pt very appreciative.

## 2019-08-18 NOTE — Progress Notes (Signed)
Hematology and Oncology Follow Up Visit  Ebony Garcia [id number] Aug 10, 1951 68 y.o. 08/18/2019   Principle Diagnosis:  Medication induced thrombocytopenia B 12 deficiency   Current Therapy:   Observation B 12 injection monthly   Interim History:  Ebony Garcia is here today for follow-up. She is doing fairly well. She still states that she has no energy and stays fatigued.  She still has a lot of stress at home with a strained family dynamic. She denies thoughts of harming herself or others. She has a strong faith and prays quite a lot to help work through things.  No episodes of bleeding, no bruising or petechiae. Platelet count today is slightly better at 54.  No fever, n/v, cough, rash, dizziness, SOB, chest pain, palpitations, abdominal pain or changes in bowel or bladder habits.  She states that she recently stopped taking estradiol and has had hot flashes and cold chills off and on while adjusting.   No swelling or tenderness in her extremities.  She has numbness and tingling in her hands and feet that comes and goes.  She eats one meal a day and snacks throughout the day. She states that she stays well hydrated and her weight is stable.  No falls or syncopal episodes to report.   ECOG Performance Status: 1 - Symptomatic but completely ambulatory  Medications:  Allergies as of 08/18/2019      Reactions   Amoxicillin-pot Clavulanate Diarrhea   Antihistamines, Diphenhydramine-type Other (See Comments)   Other Reaction: makes pt nervous   Diphenhydramine Hcl Other (See Comments)   Other Reaction: makes pt nervous   Amoxicillin-pot Clavulanate Diarrhea   Clindamycin    She has not taken but per patient  "her mother had a reaction and she does not want it ever"   Hydrocodone-acetaminophen    REACTION: sensitive to  but can take for extreme pain per patient   Nortriptyline    Propoxyphene Hcl Nausea Only   Propoxyphene N-acetaminophen Nausea Only   Sulfa Antibiotics     Unknown    Tramadol Other (See Comments)   Pt does know what kind of effect   Fremanezumab-vfrm Rash, Other (See Comments)   Seizure, jerking   Lisinopril    Restless , irregular sleep      Medication List       Accurate as of August 18, 2019 10:54 AM. If you have any questions, ask your nurse or doctor.        ALPRAZolam 1 MG tablet Commonly known as: XANAX Take 1 tablet (1 mg total) by mouth at bedtime as needed. for sleep   Emgality 120 MG/ML Sosy Generic drug: Galcanezumab-gnlm Inject 120 mg into the skin every 30 (thirty) days.   FLUoxetine 20 MG capsule Commonly known as: PROZAC Take 1 capsule (20 mg total) by mouth daily.   furosemide 20 MG tablet Commonly known as: LASIX Take 1 tablet (20 mg total) by mouth daily.   metoprolol succinate 50 MG 24 hr tablet Commonly known as: TOPROL-XL TAKE 1 TABLET BY MOUTH ONCE DAILY OR  IMMEDIATELY  FOLLOWING  A  MEAL   nitroGLYCERIN 0.4 MG SL tablet Commonly known as: NITROSTAT Place 1 tablet (0.4 mg total) under the tongue every 5 (five) minutes as needed for chest pain.   promethazine 25 MG tablet Commonly known as: PHENERGAN TAKE ONE TABLET BY MOUTH EVERY 6 HOURS AS NEEDED FOR NAUSEA AND VOMITING   SUMAtriptan 10 MG/ACT Soln Commonly known as: Tosymra Place 10 mg into the  nose every hour as needed (Maximum 3 sprays/24 hours). What changed: additional instructions   topiramate 100 MG tablet Commonly known as: TOPAMAX Take 1 tablet by mouth twice daily   topiramate 100 MG tablet Commonly known as: TOPAMAX Take 1 tablet (100 mg total) by mouth 2 (two) times daily.   triamcinolone ointment 0.1 % Commonly known as: KENALOG Apply topically as needed   zolmitriptan 5 MG nasal solution Commonly known as: Zomig Place 1 spray into the nose as needed for migraine.       Allergies:  Allergies  Allergen Reactions  . Amoxicillin-Pot Clavulanate Diarrhea  . Antihistamines, Diphenhydramine-Type Other (See  Comments)    Other Reaction: makes pt nervous  . Diphenhydramine Hcl Other (See Comments)    Other Reaction: makes pt nervous  . Amoxicillin-Pot Clavulanate Diarrhea  . Clindamycin     She has not taken but per patient  "her mother had a reaction and she does not want it ever"  . Hydrocodone-Acetaminophen     REACTION: sensitive to  but can take for extreme pain per patient  . Nortriptyline   . Propoxyphene Hcl Nausea Only  . Propoxyphene N-Acetaminophen Nausea Only  . Sulfa Antibiotics     Unknown   . Tramadol Other (See Comments)    Pt does know what kind of effect  . Fremanezumab-Vfrm Rash and Other (See Comments)    Seizure, jerking  . Lisinopril     Restless , irregular sleep    Past Medical History, Surgical history, Social history, and Family History were reviewed and updated.  Review of Systems: All other 10 point review of systems is negative.   Physical Exam:  vitals were not taken for this visit.   Wt Readings from Last 3 Encounters:  08/03/19 176 lb 9.6 oz (80.1 kg)  06/12/19 170 lb (77.1 kg)  05/17/19 170 lb 6.4 oz (77.3 kg)    Ocular: Sclerae unicteric, pupils equal, round and reactive to light Ear-nose-throat: Oropharynx clear, dentition fair Lymphatic: No cervical or supraclavicular adenopathy Lungs no rales or rhonchi, good excursion bilaterally Heart regular rate and rhythm, no murmur appreciated Abd soft, nontender, positive bowel sounds, no liver or spleen tip palpated on exam, no fluid wave  MSK no focal spinal tenderness, no joint edema Neuro: non-focal, well-oriented, appropriate affect Breasts: Deferred   Lab Results  Component Value Date   WBC 6.8 08/18/2019   HGB 13.8 08/18/2019   HCT 43.4 08/18/2019   MCV 95.8 08/18/2019   PLT 54 (L) 08/18/2019   No results found for: FERRITIN, IRON, TIBC, UIBC, IRONPCTSAT Lab Results  Component Value Date   RBC 4.53 08/18/2019   No results found for: KPAFRELGTCHN, LAMBDASER, KAPLAMBRATIO No  results found for: IGGSERUM, IGA, IGMSERUM No results found for: Odetta Pink, SPEI   Chemistry      Component Value Date/Time   NA 142 08/03/2019 1027   NA 143 11/11/2017 1339   K 3.3 (L) 08/03/2019 1027   K 3.6 11/11/2017 1339   CL 110 08/03/2019 1027   CL 109 (H) 11/11/2017 1339   CO2 23 08/03/2019 1027   CO2 24 11/11/2017 1339   BUN 9 08/03/2019 1027   BUN 9 11/11/2017 1339   CREATININE 1.02 08/03/2019 1027   CREATININE 0.95 06/14/2019 1033   CREATININE 1.0 11/11/2017 1339      Component Value Date/Time   CALCIUM 8.8 08/03/2019 1027   CALCIUM 9.3 11/11/2017 1339   ALKPHOS 72 08/03/2019 1027  ALKPHOS 85 (H) 11/11/2017 1339   AST 14 08/03/2019 1027   AST 16 06/14/2019 1033   ALT 11 08/03/2019 1027   ALT 14 06/14/2019 1033   ALT 24 11/11/2017 1339   BILITOT 0.4 08/03/2019 1027   BILITOT 0.3 06/14/2019 1033       Impression and Plan: Ebony Garcia is a pleasant 68 yo caucasian female with thrombocytopenia and B 12 deficiency.  Platelets are 54 today, we will continue to monitor.  She did received her B 12 injection today as planned and will stay on her monthly schedule.  We will plan to see her back in another 3 months.  We did discuss s/s of low platelets to watch out for and she will contact our office with any questions or concerns. We can certainly see her sooner if needed.   Laverna Peace, NP 9/11/202010:54 AM

## 2019-08-30 ENCOUNTER — Other Ambulatory Visit: Payer: Self-pay | Admitting: Family Medicine

## 2019-08-30 DIAGNOSIS — F419 Anxiety disorder, unspecified: Secondary | ICD-10-CM

## 2019-08-30 NOTE — Telephone Encounter (Signed)
Will refill but we need uds and contract

## 2019-08-30 NOTE — Telephone Encounter (Signed)
Requesting: Xanax Contract: 11/24/2017 UDS: 11/24/2017 Last OV: 08/03/2019 Next OV: N/A Last Refill:  08/03/2019, #30--0 RF Database:   Please advise

## 2019-08-31 ENCOUNTER — Telehealth: Payer: Self-pay | Admitting: Family Medicine

## 2019-08-31 NOTE — Telephone Encounter (Signed)
Called patient to schedule AWV, but patient did not answer. Will try to call patient back at later time. SF

## 2019-09-18 ENCOUNTER — Inpatient Hospital Stay: Payer: Medicare Other | Attending: Hematology & Oncology

## 2019-09-18 ENCOUNTER — Other Ambulatory Visit: Payer: Self-pay

## 2019-09-18 VITALS — BP 140/81 | HR 59 | Temp 97.1°F | Resp 16

## 2019-09-18 DIAGNOSIS — E538 Deficiency of other specified B group vitamins: Secondary | ICD-10-CM | POA: Diagnosis not present

## 2019-09-18 MED ORDER — CYANOCOBALAMIN 1000 MCG/ML IJ SOLN
1000.0000 ug | Freq: Once | INTRAMUSCULAR | Status: AC
  Administered 2019-09-18: 1000 ug via INTRAMUSCULAR

## 2019-09-18 MED ORDER — CYANOCOBALAMIN 1000 MCG/ML IJ SOLN
INTRAMUSCULAR | Status: AC
  Filled 2019-09-18: qty 1

## 2019-09-18 NOTE — Patient Instructions (Signed)

## 2019-09-28 ENCOUNTER — Other Ambulatory Visit: Payer: Self-pay | Admitting: Family Medicine

## 2019-09-28 DIAGNOSIS — F419 Anxiety disorder, unspecified: Secondary | ICD-10-CM

## 2019-09-28 NOTE — Telephone Encounter (Signed)
Requesting: Xanax Contract: 11/24/2017 UDS: 11/24/2018 Last OV: 08/03/2019 Next OV: N/A Last Refill: 08/30/2019, #30--0 RF Database:   Please advise

## 2019-10-10 ENCOUNTER — Telehealth: Payer: Self-pay

## 2019-10-10 NOTE — Telephone Encounter (Signed)
Copied from [place] [id number]. Topic: General - Inquiry >> Oct 10, 2019  2:43 PM Richardo Priest, NT wrote: Reason for CRM: Pt called in and would like to have COVID-19 anitbody test done. Please advise.

## 2019-10-10 NOTE — Telephone Encounter (Signed)
Yes

## 2019-10-11 ENCOUNTER — Other Ambulatory Visit: Payer: Self-pay

## 2019-10-11 DIAGNOSIS — Z20828 Contact with and (suspected) exposure to other viral communicable diseases: Secondary | ICD-10-CM

## 2019-10-11 DIAGNOSIS — Z20822 Contact with and (suspected) exposure to covid-19: Secondary | ICD-10-CM

## 2019-10-11 NOTE — Telephone Encounter (Signed)
Orders placed. Left VM for patient to call to set up lab appointment and to make sure she wasn't having COVID symptoms.

## 2019-10-12 ENCOUNTER — Other Ambulatory Visit: Payer: Self-pay

## 2019-10-12 ENCOUNTER — Ambulatory Visit: Payer: Medicare Other | Admitting: Family Medicine

## 2019-10-17 ENCOUNTER — Other Ambulatory Visit: Payer: Self-pay

## 2019-10-17 MED ORDER — ZEMBRACE SYMTOUCH 3 MG/0.5ML ~~LOC~~ SOAJ: 3.0000 mg | SUBCUTANEOUS | 0 refills | Status: DC | PRN

## 2019-10-17 MED ORDER — TOSYMRA 10 MG/ACT NA SOLN: 10.0000 mg | NASAL | 3 refills | Status: DC | PRN

## 2019-10-17 MED ORDER — EMGALITY 120 MG/ML ~~LOC~~ SOAJ: 120.0000 mg | SUBCUTANEOUS | 3 refills | Status: DC

## 2019-10-17 NOTE — Telephone Encounter (Signed)
I would like to refill Emgality and Tosymra.

## 2019-10-17 NOTE — Telephone Encounter (Signed)
Dr. Tomi Likens,  Pt has never tried the Imitrex injection and does not really want to. States that she has tried the Zomig Nasal spray in the past and it was helpful. We do have samples of that.  I questioned pt on why we were not sending in refills of Emgality and Tosymra or initiating a prior auth if needed. Pt was unsure and stated that she has short term memory problems.  Are you ok with me sending in refills of Emgality and Tosymra? Until pt can pick them up or medications get approval she can come get samples of Zomig?

## 2019-10-19 ENCOUNTER — Inpatient Hospital Stay: Payer: Medicare Other

## 2019-10-20 ENCOUNTER — Telehealth: Payer: Self-pay

## 2019-10-20 MED ORDER — SUMATRIPTAN 20 MG/ACT NA SOLN: 20.0000 mg | NASAL | 11 refills | Status: DC | PRN

## 2019-10-20 NOTE — Telephone Encounter (Signed)
Spoke with patient she is ok with trying this medication. Rx sent Received verbal from Dr. Tomi Likens on directions.

## 2019-10-20 NOTE — Addendum Note (Signed)
Addended by: Ranae Plumber on: 10/20/2019 04:02 PM   Modules accepted: Orders

## 2019-10-20 NOTE — Progress Notes (Signed)
Received PA determination from [place] [facility] 10 MG STATUS: DENIED  Requesting being patient have not tried the formulary alternative that treats the same condition or diagnosis.  Alternative Rx: SUMATRIPTAN NASAL SPRAY (QTY limits may apply)    PT ID# [id number] PLAN ID: [id number] CASE ID: [id number]

## 2019-10-20 NOTE — Telephone Encounter (Signed)
Called patient to make her aware that insurnace denied Rx for Tosymra 10 mg. She need to try alternative SUMATRIPTAN NASAL SPRAY.  Per Dr. Tomi Likens if patient would like to try this ok to send in.  No answer left message to call office back if she would like to try.

## 2019-10-23 ENCOUNTER — Other Ambulatory Visit: Payer: Self-pay | Admitting: Neurology

## 2019-10-23 ENCOUNTER — Other Ambulatory Visit: Payer: Self-pay

## 2019-10-23 ENCOUNTER — Inpatient Hospital Stay: Payer: Medicare Other | Attending: Hematology & Oncology

## 2019-10-23 VITALS — BP 134/82 | HR 63 | Temp 97.1°F | Resp 17

## 2019-10-23 DIAGNOSIS — E538 Deficiency of other specified B group vitamins: Secondary | ICD-10-CM | POA: Insufficient documentation

## 2019-10-23 MED ORDER — CYANOCOBALAMIN 1000 MCG/ML IJ SOLN
1000.0000 ug | Freq: Once | INTRAMUSCULAR | Status: AC
  Administered 2019-10-23: 1000 ug via INTRAMUSCULAR

## 2019-10-23 MED ORDER — CYANOCOBALAMIN 1000 MCG/ML IJ SOLN
INTRAMUSCULAR | Status: AC
  Filled 2019-10-23: qty 1

## 2019-10-23 MED ORDER — TOSYMRA 10 MG/ACT NA SOLN: 1.0000 | NASAL | 5 refills | Status: DC | PRN

## 2019-10-23 NOTE — Patient Instructions (Signed)

## 2019-10-30 ENCOUNTER — Other Ambulatory Visit: Payer: Self-pay | Admitting: Family Medicine

## 2019-10-30 DIAGNOSIS — F419 Anxiety disorder, unspecified: Secondary | ICD-10-CM

## 2019-10-30 NOTE — Telephone Encounter (Signed)
Requesting:   Alprazolam Contract:   11/24/2017 UDS:  Low risk next is due on 11/2018 Last Visit:   08/03/2019 Next Visit:   none Last Refill:   09/28/2019   #30 no refills  Please Advise

## 2019-11-07 ENCOUNTER — Other Ambulatory Visit: Payer: Self-pay | Admitting: Family Medicine

## 2019-11-07 DIAGNOSIS — I5189 Other ill-defined heart diseases: Secondary | ICD-10-CM

## 2019-11-16 ENCOUNTER — Inpatient Hospital Stay: Payer: Medicare Other

## 2019-11-16 ENCOUNTER — Inpatient Hospital Stay: Payer: Medicare Other | Admitting: Hematology & Oncology

## 2019-11-20 ENCOUNTER — Inpatient Hospital Stay: Payer: Medicare Other | Attending: Hematology & Oncology

## 2019-11-20 ENCOUNTER — Other Ambulatory Visit: Payer: Self-pay

## 2019-11-20 ENCOUNTER — Inpatient Hospital Stay: Payer: Medicare Other

## 2019-11-20 ENCOUNTER — Encounter: Payer: Self-pay | Admitting: Hematology & Oncology

## 2019-11-20 ENCOUNTER — Inpatient Hospital Stay (HOSPITAL_BASED_OUTPATIENT_CLINIC_OR_DEPARTMENT_OTHER): Payer: Medicare Other | Admitting: Hematology & Oncology

## 2019-11-20 VITALS — BP 126/78 | HR 59 | Temp 97.2°F | Resp 18

## 2019-11-20 DIAGNOSIS — M791 Myalgia, unspecified site: Secondary | ICD-10-CM | POA: Diagnosis not present

## 2019-11-20 DIAGNOSIS — E538 Deficiency of other specified B group vitamins: Secondary | ICD-10-CM | POA: Diagnosis not present

## 2019-11-20 DIAGNOSIS — Z79899 Other long term (current) drug therapy: Secondary | ICD-10-CM | POA: Insufficient documentation

## 2019-11-20 DIAGNOSIS — R590 Localized enlarged lymph nodes: Secondary | ICD-10-CM

## 2019-11-20 DIAGNOSIS — D6959 Other secondary thrombocytopenia: Secondary | ICD-10-CM | POA: Insufficient documentation

## 2019-11-20 DIAGNOSIS — D696 Thrombocytopenia, unspecified: Secondary | ICD-10-CM

## 2019-11-20 LAB — CBC WITH DIFFERENTIAL (CANCER CENTER ONLY)
Abs Immature Granulocytes: 0.01 10*3/uL (ref 0.00–0.07)
Basophils Absolute: 0.1 10*3/uL (ref 0.0–0.1)
Basophils Relative: 1 %
Eosinophils Absolute: 0.1 10*3/uL (ref 0.0–0.5)
Eosinophils Relative: 2 %
HCT: 44.2 % (ref 36.0–46.0)
Hemoglobin: 14.1 g/dL (ref 12.0–15.0)
Immature Granulocytes: 0 %
Lymphocytes Relative: 27 %
Lymphs Abs: 1.8 10*3/uL (ref 0.7–4.0)
MCH: 29.9 pg (ref 26.0–34.0)
MCHC: 31.9 g/dL (ref 30.0–36.0)
MCV: 93.6 fL (ref 80.0–100.0)
Monocytes Absolute: 0.4 10*3/uL (ref 0.1–1.0)
Monocytes Relative: 7 %
Neutro Abs: 4.3 10*3/uL (ref 1.7–7.7)
Neutrophils Relative %: 63 %
Platelet Count: 63 10*3/uL — ABNORMAL LOW (ref 150–400)
RBC: 4.72 MIL/uL (ref 3.87–5.11)
RDW: 13.2 % (ref 11.5–15.5)
WBC Count: 6.7 10*3/uL (ref 4.0–10.5)
nRBC: 0 % (ref 0.0–0.2)

## 2019-11-20 LAB — CMP (CANCER CENTER ONLY)
ALT: 13 U/L (ref 0–44)
AST: 15 U/L (ref 15–41)
Albumin: 4.8 g/dL (ref 3.5–5.0)
Alkaline Phosphatase: 85 U/L (ref 38–126)
Anion gap: 8 (ref 5–15)
BUN: 14 mg/dL (ref 8–23)
CO2: 27 mmol/L (ref 22–32)
Calcium: 9.4 mg/dL (ref 8.9–10.3)
Chloride: 107 mmol/L (ref 98–111)
Creatinine: 1.12 mg/dL — ABNORMAL HIGH (ref 0.44–1.00)
GFR, Est AFR Am: 59 mL/min — ABNORMAL LOW (ref >60)
GFR, Estimated: 51 mL/min — ABNORMAL LOW (ref >60)
Glucose, Bld: 105 mg/dL — ABNORMAL HIGH (ref 70–99)
Potassium: 3.9 mmol/L (ref 3.5–5.1)
Sodium: 142 mmol/L (ref 135–145)
Total Bilirubin: 0.4 mg/dL (ref 0.3–1.2)
Total Protein: 7.2 g/dL (ref 6.5–8.1)

## 2019-11-20 LAB — PLATELET BY CITRATE

## 2019-11-20 LAB — VITAMIN B12: Vitamin B-12: 451 pg/mL (ref 180–914)

## 2019-11-20 LAB — SAVE SMEAR(SSMR), FOR PROVIDER SLIDE REVIEW

## 2019-11-20 MED ORDER — CYANOCOBALAMIN 1000 MCG/ML IJ SOLN
INTRAMUSCULAR | Status: AC
  Filled 2019-11-20: qty 1

## 2019-11-20 MED ORDER — CYANOCOBALAMIN 1000 MCG/ML IJ SOLN
1000.0000 ug | Freq: Once | INTRAMUSCULAR | Status: AC
  Administered 2019-11-20: 15:00:00 1000 ug via INTRAMUSCULAR

## 2019-11-20 NOTE — Patient Instructions (Signed)
Cyanocobalamin, Vitamin B12 injection (B 12) What is this medicine? CYANOCOBALAMIN (sye an oh koe BAL a min) is a man made form of vitamin B12. Vitamin B12 is used in the growth of healthy blood cells, nerve cells, and proteins in the body. It also helps with the metabolism of fats and carbohydrates. This medicine is used to treat people who can not absorb vitamin B12. This medicine may be used for other purposes; ask your health care provider or pharmacist if you have questions. COMMON BRAND NAME(S): B-12 Compliance Kit, B-12 Injection Kit, Cyomin, LA-12, Nutri-Twelve, Physicians EZ Use B-12, Primabalt What should I tell my health care provider before I take this medicine? They need to know if you have any of these conditions:  kidney disease  Leber's disease  megaloblastic anemia  an unusual or allergic reaction to cyanocobalamin, cobalt, other medicines, foods, dyes, or preservatives  pregnant or trying to get pregnant  breast-feeding How should I use this medicine? This medicine is injected into a muscle or deeply under the skin. It is usually given by a health care professional in a clinic or doctor's office. However, your doctor may teach you how to inject yourself. Follow all instructions. Talk to your pediatrician regarding the use of this medicine in children. Special care may be needed. Overdosage: If you think you have taken too much of this medicine contact a poison control center or emergency room at once. NOTE: This medicine is only for you. Do not share this medicine with others. What if I miss a dose? If you are given your dose at a clinic or doctor's office, call to reschedule your appointment. If you give your own injections and you miss a dose, take it as soon as you can. If it is almost time for your next dose, take only that dose. Do not take double or extra doses. What may interact with this medicine?  colchicine  heavy alcohol intake This list may not describe  all possible interactions. Give your health care provider a list of all the medicines, herbs, non-prescription drugs, or dietary supplements you use. Also tell them if you smoke, drink alcohol, or use illegal drugs. Some items may interact with your medicine. What should I watch for while using this medicine? Visit your doctor or health care professional regularly. You may need blood work done while you are taking this medicine. You may need to follow a special diet. Talk to your doctor. Limit your alcohol intake and avoid smoking to get the best benefit. What side effects may I notice from receiving this medicine? Side effects that you should report to your doctor or health care professional as soon as possible:  allergic reactions like skin rash, itching or hives, swelling of the face, lips, or tongue  blue tint to skin  chest tightness, pain  difficulty breathing, wheezing  dizziness  red, swollen painful area on the leg Side effects that usually do not require medical attention (report to your doctor or health care professional if they continue or are bothersome):  diarrhea  headache This list may not describe all possible side effects. Call your doctor for medical advice about side effects. You may report side effects to FDA at 1-800-FDA-1088. Where should I keep my medicine? Keep out of the reach of children. Store at room temperature between 15 and 30 degrees C (59 and 85 degrees F). Protect from light. Throw away any unused medicine after the expiration date. NOTE: This sheet is a summary. It may  not cover all possible information. If you have questions about this medicine, talk to your doctor, pharmacist, or health care provider.  2020 Elsevier/Gold Standard (2008-03-05 22:10:20)

## 2019-11-20 NOTE — Progress Notes (Signed)
Hematology and Oncology Follow Up Visit  Shamell T [place] [id number] 1951/03/18 68 y.o. 11/20/2019   Principle Diagnosis:  Medication induced thrombocytopenia B 12 deficiency   Current Therapy:   Observation B 12 injection monthly   Interim History:  Ms. [place] is here today for follow-up.  She is having quite a few issues.  I think the biggest problem is that her father passed away 11 days ago.  He had quite a few health problems.  Thankfully, he was at [facility].  They really helped out and really made a lot easier and dignified for him.  She was incredibly thankful for the staff at [facility].  She is having problems with her migraines.  Neurology manages this.  She has not been able to get the medication that she needs for her migraines.  She has not been all that active because of the migraines.  She is not been able to do as much as she would like.  She had a very quiet Thanksgiving with her mom.  Her last vitamin B12 level was 340.  We will give her B12 monthly.  She has had no fever.  Has been no cough.  Para she has noticed a lymph node on the right side of her neck.  This is in the posterior neck.  She says it swells on occasion.  We probably going to have to get a CT scan of her neck and body to see there is any thing else that we might be able to identify that could be a problem.  Overall, her performance status is ECOG 1.    Medications:  Allergies as of 11/20/2019      Reactions   Amoxicillin-pot Clavulanate Diarrhea   Antihistamines, Diphenhydramine-type Other (See Comments)   Other Reaction: makes pt nervous   Diphenhydramine Hcl Other (See Comments)   Other Reaction: makes pt nervous   Fremanezumab-vfrm Rash, Other (See Comments)   Seizure, jerking   Clindamycin Other (See Comments)   She has not taken but per patient  "her mother had a reaction and she does not want it ever"   Hydrocodone-acetaminophen Other (See Comments)   REACTION:  sensitive to  but can take for extreme pain per patient   Lisinopril    Restless , irregular sleep   Nortriptyline Other (See Comments)   UNKNOWN REACTION   Propoxyphene N-acetaminophen Nausea Only   Sulfa Antibiotics Other (See Comments)   Unknown reaction   Tramadol Other (See Comments)   UNKNOWN REACTION      Medication List       Accurate as of November 20, 2019  3:37 PM. If you have any questions, ask your nurse or doctor.        ALPRAZolam 1 MG tablet Commonly known as: XANAX TAKE 1 TABLET BY MOUTH AT BEDTIME AS NEEDED FOR SLEEP   Emgality 120 MG/ML Sosy Generic drug: Galcanezumab-gnlm Inject 120 mg into the skin every 30 (thirty) days.   Emgality 120 MG/ML Soaj Generic drug: Galcanezumab-gnlm Inject 120 mg into the skin every 30 (thirty) days.   FLUoxetine 20 MG capsule Commonly known as: PROZAC Take 1 capsule (20 mg total) by mouth daily.   furosemide 20 MG tablet Commonly known as: LASIX Take 1 tablet by mouth once daily   metoprolol succinate 50 MG 24 hr tablet Commonly known as: TOPROL-XL TAKE 1 TABLET BY MOUTH ONCE DAILY OR  IMMEDIATELY  FOLLOWING  A  MEAL   nitroGLYCERIN 0.4 MG SL tablet Commonly known  as: NITROSTAT Place 1 tablet (0.4 mg total) under the tongue every 5 (five) minutes as needed for chest pain.   promethazine 25 MG tablet Commonly known as: PHENERGAN TAKE ONE TABLET BY MOUTH EVERY 6 HOURS AS NEEDED FOR NAUSEA AND VOMITING   topiramate 100 MG tablet Commonly known as: TOPAMAX Take 1 tablet (100 mg total) by mouth 2 (two) times daily.   Tosymra 10 MG/ACT Soln Generic drug: SUMAtriptan Place 1 spray into the nose as needed (May repeat after 1 hour if needed.  Maximum 2 sprays in 24 hours).   triamcinolone ointment 0.1 % Commonly known as: KENALOG Apply topically as needed   Zembrace SymTouch 3 MG/0.5ML Soaj Generic drug: SUMAtriptan Succinate Inject 3 mg into the skin 1 (one) time orthopaedic injection for up to 1 dose.     zolmitriptan 5 MG nasal solution Commonly known as: Zomig Place 1 spray into the nose as needed for migraine.       Allergies:  Allergies  Allergen Reactions  . Amoxicillin-Pot Clavulanate Diarrhea  . Antihistamines, Diphenhydramine-Type Other (See Comments)    Other Reaction: makes pt nervous  . Diphenhydramine Hcl Other (See Comments)    Other Reaction: makes pt nervous  . Fremanezumab-Vfrm Rash and Other (See Comments)    Seizure, jerking  . Clindamycin Other (See Comments)    She has not taken but per patient  "her mother had a reaction and she does not want it ever"  . Hydrocodone-Acetaminophen Other (See Comments)    REACTION: sensitive to  but can take for extreme pain per patient  . Lisinopril     Restless , irregular sleep  . Nortriptyline Other (See Comments)    UNKNOWN REACTION  . Propoxyphene N-Acetaminophen Nausea Only  . Sulfa Antibiotics Other (See Comments)    Unknown reaction  . Tramadol Other (See Comments)    UNKNOWN REACTION    Past Medical History, Surgical history, Social history, and Family History were reviewed and updated.  Review of Systems: Review of Systems  Constitutional: Positive for malaise/fatigue.  HENT: Negative.   Eyes: Negative.   Respiratory: Positive for shortness of breath.   Cardiovascular: Positive for palpitations.  Gastrointestinal: Positive for nausea.  Genitourinary: Negative.   Musculoskeletal: Positive for joint pain and myalgias.  Skin: Negative.   Neurological: Positive for headaches.  Endo/Heme/Allergies: Negative.   Psychiatric/Behavioral: Negative.      Physical Exam:  temporal temperature is 97.2 F (36.2 C) (abnormal). Her blood pressure is 126/78 and her pulse is 59 (abnormal). Her respiration is 18 and oxygen saturation is 100%.   Wt Readings from Last 3 Encounters:  08/18/19 177 lb (80.3 kg)  08/03/19 176 lb 9.6 oz (80.1 kg)  06/12/19 170 lb (77.1 kg)      Physical Activity:   . Days of Exercise  per Week: Not on file  . Minutes of Exercise per Session: Not on file     Physical Activity:   . Days of Exercise per Week: Not on file  . Minutes of Exercise per Session: Not on file   Physical Exam Vitals reviewed.  HENT:     Head: Normocephalic and atraumatic.  Eyes:     Pupils: Pupils are equal, round, and reactive to light.  Neck:     Comments: In the right posterior neck, there is a lymph node that probably measures about 5-8 mm.  It is nontender it is somewhat mobile. Cardiovascular:     Rate and Rhythm: Normal rate and regular rhythm.  Heart sounds: Normal heart sounds.  Pulmonary:     Effort: Pulmonary effort is normal.     Breath sounds: Normal breath sounds.  Abdominal:     General: Bowel sounds are normal.     Palpations: Abdomen is soft.  Musculoskeletal:        General: No tenderness or deformity. Normal range of motion.     Cervical back: Normal range of motion.  Lymphadenopathy:     Cervical: No cervical adenopathy.  Skin:    General: Skin is warm and dry.     Findings: No erythema or rash.  Neurological:     Mental Status: She is alert and oriented to person, place, and time.  Psychiatric:        Behavior: Behavior normal.        Thought Content: Thought content normal.        Judgment: Judgment normal.         Lab Results  Component Value Date   WBC 6.7 11/20/2019   HGB 14.1 11/20/2019   HCT 44.2 11/20/2019   MCV 93.6 11/20/2019   PLT 63 (L) 11/20/2019   No results found for: FERRITIN, IRON, TIBC, UIBC, IRONPCTSAT Lab Results  Component Value Date   RBC 4.72 11/20/2019   No results found for: KPAFRELGTCHN, LAMBDASER, KAPLAMBRATIO No results found for: IGGSERUM, IGA, IGMSERUM No results found for: Odetta Pink, SPEI   Chemistry      Component Value Date/Time   NA 142 11/20/2019 1346   NA 143 11/11/2017 1339   K 3.9 11/20/2019 1346   K 3.6 11/11/2017 1339   CL 107 11/20/2019  1346   CL 109 (H) 11/11/2017 1339   CO2 27 11/20/2019 1346   CO2 24 11/11/2017 1339   BUN 14 11/20/2019 1346   BUN 9 11/11/2017 1339   CREATININE 1.12 (H) 11/20/2019 1346   CREATININE 1.0 11/11/2017 1339      Component Value Date/Time   CALCIUM 9.4 11/20/2019 1346   CALCIUM 9.3 11/11/2017 1339   ALKPHOS 85 11/20/2019 1346   ALKPHOS 85 (H) 11/11/2017 1339   AST 15 11/20/2019 1346   ALT 13 11/20/2019 1346   ALT 24 11/11/2017 1339   BILITOT 0.4 11/20/2019 1346       Impression and Plan: Ms. [place] is a pleasant 68 yo caucasian female with thrombocytopenia and B 12 deficiency.   Her platelet count is better.  Continues to improve.  I will know this is from the B12 supplementation.  She said that she was told that low platelets will cause her migraines.  I have not heard of this before.  I do not see how this could be a factor.  I cannot think of any association between thrombocytopenia and migraines.  We will go ahead and get a CT scan on her.  This will be of the neck and body.  I would not think that she has lymphoma but this is always a possibility.  She will get her B12 injection today.  I spent about 35 minutes with her.  As always, we had excellent faith discussion.  She has a very strong faith.  We will get her back in about a month or so.  She would like to have a CAT scan done in January when her insurance changes.   Volanda Napoleon, MD 12/14/20203:37 PM

## 2019-11-21 ENCOUNTER — Telehealth: Payer: Self-pay | Admitting: Hematology & Oncology

## 2019-11-21 NOTE — Telephone Encounter (Signed)
Called and advised patient of appointments added per 12/14 los

## 2019-11-27 ENCOUNTER — Other Ambulatory Visit: Payer: Self-pay | Admitting: Family Medicine

## 2019-11-27 DIAGNOSIS — F419 Anxiety disorder, unspecified: Secondary | ICD-10-CM

## 2019-11-28 ENCOUNTER — Encounter: Payer: Self-pay | Admitting: Neurology

## 2019-11-28 NOTE — Telephone Encounter (Signed)
Requesting: Xanax Contract: 11/24/2017 UDS: 11/24/2017 Last OV: 08/03/2019 Next OV: N/A Last Refill: 10/30/2019, #30--0 RF Database:   Please advise

## 2019-11-30 ENCOUNTER — Other Ambulatory Visit: Payer: Self-pay

## 2019-12-06 ENCOUNTER — Other Ambulatory Visit: Payer: Self-pay | Admitting: Family Medicine

## 2019-12-06 ENCOUNTER — Telehealth: Payer: Self-pay | Admitting: *Deleted

## 2019-12-06 DIAGNOSIS — I5189 Other ill-defined heart diseases: Secondary | ICD-10-CM

## 2019-12-06 DIAGNOSIS — F419 Anxiety disorder, unspecified: Secondary | ICD-10-CM

## 2019-12-06 DIAGNOSIS — R002 Palpitations: Secondary | ICD-10-CM

## 2019-12-06 DIAGNOSIS — I10 Essential (primary) hypertension: Secondary | ICD-10-CM

## 2019-12-06 DIAGNOSIS — R110 Nausea: Secondary | ICD-10-CM

## 2019-12-06 MED ORDER — PROMETHAZINE HCL 25 MG PO TABS: ORAL_TABLET | ORAL | 2 refills | Status: DC

## 2019-12-06 MED ORDER — METOPROLOL SUCCINATE ER 50 MG PO TB24: ORAL_TABLET | ORAL | 0 refills | Status: DC

## 2019-12-06 MED ORDER — FLUOXETINE HCL 20 MG PO CAPS: 20.0000 mg | ORAL_CAPSULE | Freq: Every day | ORAL | 0 refills | Status: DC

## 2019-12-06 MED ORDER — ALPRAZOLAM 1 MG PO TABS: 1.0000 mg | ORAL_TABLET | Freq: Every evening | ORAL | 1 refills | Status: DC | PRN

## 2019-12-06 MED ORDER — FUROSEMIDE 20 MG PO TABS: 20.0000 mg | ORAL_TABLET | Freq: Every day | ORAL | 0 refills | Status: DC

## 2019-12-06 NOTE — Telephone Encounter (Signed)
Received faxes from [facility] mail order requesting new RXs: metoprolol, alprazolam, promethazine, furosemide and fluoxetine.  RX sent on all except: alprazolam and promethazine. Unsure if these are ok to send to mail order? If so, please send RXs.  Last alprazolam RX:  11/28/19, #30 (local pharmacy) Last OV:  08/03/19 Next OV: due 2/27 but not yet scheduled UDS:  11/24/17, past due CSC:  11/24/17, past due  Last Promethazine RX:  08/03/19, #30 x 2 refills Last OV: 08/03/19 Next OV:  2/27 but not yet scheduled

## 2019-12-13 ENCOUNTER — Ambulatory Visit (HOSPITAL_BASED_OUTPATIENT_CLINIC_OR_DEPARTMENT_OTHER): Payer: Medicare HMO

## 2019-12-13 ENCOUNTER — Encounter (HOSPITAL_BASED_OUTPATIENT_CLINIC_OR_DEPARTMENT_OTHER): Payer: Self-pay

## 2019-12-13 NOTE — Progress Notes (Signed)
RESCHEDULED PATIENT.  SHE WAS UNABLE TO CONNECT TO VIRTUAL VISIT.   Ebony Montenegrois a 69 year old right-handed woman with hypertension, CHF, renal calculi, depression and hyperlipidemia who follows up for migraines.  UPDATE To assess new onset headaches, CTA of head performed on 07/05/2019 which was personally reviewed and was unremarkable for mass lesion, high-grade arterial stenosis, aneurysm or other acute intracranial abnormality.  Intensity:  Moderate to severe Duration:  4-6 hours untreated.  15 minutes Tosymra. Frequency:  She wakes up with headaches daily but once she is up, they go away after about 4-6 hours. Over the past few months, she reports new headaches that are very severe, occurring most frequently.  On some occasions, she alomost Current NSAIDS:no Current analgesics: no. Current triptans:Tosymra (effective) Current anti-emetic: Phenergan 25mg  Current muscle relaxants: no Current anti-anxiolytic: Xanax 1mg  Current sleep aide: Xanax 1mg  Current Antihypertensive medications: Toprol XL 50mg , Lasix Current Antidepressant medications: Prozac 20mg  Current Anticonvulsant medications: topiramate 100mg  BID Current anti-CGRP:  Emgality  Depression and anxiety: Yes She reports off and on body aches, paresthesias, head pains. Self-quarantined.  Sedentary. She reports weight gain, which she attributes to the [facility].    HISTORY: Onset:  Since her late 66s. Location: usually left sided, periorbital, and back of head.More recently, holocephalic Quality:Sharp, aching pressure.Non-throbbing Initial intensity:7/10; Sept: 7/10 Associated symptoms:  Nausea, photophobia and phonophobia.No visual disturbance, osmophobia,vomitingor unilateral numbness or weakness Aura:no Initial duration:3 days; Sept: 30 minutes with Maxalt Initial frequency:25 headache days per month Triggers:  Emotional stress Relieving factors:  Cold mask, heating pack on  neck and shoulders  Past NSAIDs: naproxen (ineffective), indomethacin, toradol tablet (ineffective), Toradol 60mg  IM (effective), Cambia (side effects) Past analgesics: Tylenol (ineffective), Excedrin (ineffective), Lidocaine nasal drops (effective but caused hives), tramadol (side effects), Midrin (effective but made her sleepy) Past triptans/ergots: sumatriptan 100mg , Zomig 5mg  (effective), Frova, Relpax, DHE (effective), Maxalt (effective but unable to afford it), Zomig nasal spray (effective but can't get with her insurance because there is no co-pay card),  Past antiemetic: Reglan Past anxiolytics: Vicodin Past muscle relaxants: cyclobenzaprine Past antihypertensives: propranolol (ineffective), atenolol (ineffective), lisinopril, losartan, HCTZ Past antidepressants: amitriptyline (side effects), venlafaxine (ineffective), nortriptyline (effective but side effects such as rash), imipramine Past antiepileptics: Depakote (hair loss), lamotrigine (side effects), zonisamide (side effects), gabapentin, Keppra Past CGRP inhibitor: Aimovig (effective but concerned about side effects- ankle jerking, abnormal skin pattern), Ajovy (same side effects) Past vitamins and supplements: magnesium (ineffective), feverfew (ineffective) Other past therapy: Botox (one round, flu like symptoms, unable to afford 20% copay), occipital nerve blocks, trigger point injections, alternating heat/ice  Family history of headache:Mother, grandmother, great-aunt, cousins.  08/16/08 MRI Brain w/wo performed for "explosive headaches": nonspecific punctate hyperintensities in the subcortical white matter. 08/16/08 MRA Head: Unremarkable.There is mild attenuation in the vertebrobasilar junction and proximal basilar artery which is likely artifact.

## 2019-12-14 ENCOUNTER — Telehealth: Payer: Self-pay | Admitting: Neurology

## 2019-12-14 ENCOUNTER — Telehealth (INDEPENDENT_AMBULATORY_CARE_PROVIDER_SITE_OTHER): Payer: Medicare HMO | Admitting: Neurology

## 2019-12-14 ENCOUNTER — Encounter: Payer: Self-pay | Admitting: Neurology

## 2019-12-14 ENCOUNTER — Other Ambulatory Visit: Payer: Self-pay

## 2019-12-14 NOTE — Telephone Encounter (Signed)
Patient called to update her Pharmacy. She is using a Psychologist, clinical. She is able to get a 90 day supply for no cost to her. She said the number is [phone]. Thank you

## 2019-12-25 NOTE — Telephone Encounter (Signed)
Opened in error

## 2020-01-01 ENCOUNTER — Encounter: Payer: Self-pay | Admitting: Family

## 2020-01-01 ENCOUNTER — Other Ambulatory Visit: Payer: Self-pay

## 2020-01-01 ENCOUNTER — Inpatient Hospital Stay: Payer: Medicare HMO

## 2020-01-01 ENCOUNTER — Inpatient Hospital Stay: Payer: Medicare HMO | Attending: Hematology & Oncology

## 2020-01-01 ENCOUNTER — Inpatient Hospital Stay (HOSPITAL_BASED_OUTPATIENT_CLINIC_OR_DEPARTMENT_OTHER): Payer: Medicare HMO | Admitting: Family

## 2020-01-01 VITALS — BP 140/87 | HR 54 | Temp 97.1°F | Resp 18 | Ht 62.0 in | Wt 183.0 lb

## 2020-01-01 DIAGNOSIS — E538 Deficiency of other specified B group vitamins: Secondary | ICD-10-CM | POA: Diagnosis not present

## 2020-01-01 DIAGNOSIS — R590 Localized enlarged lymph nodes: Secondary | ICD-10-CM

## 2020-01-01 DIAGNOSIS — D6959 Other secondary thrombocytopenia: Secondary | ICD-10-CM | POA: Diagnosis not present

## 2020-01-01 DIAGNOSIS — D696 Thrombocytopenia, unspecified: Secondary | ICD-10-CM | POA: Diagnosis not present

## 2020-01-01 DIAGNOSIS — M791 Myalgia, unspecified site: Secondary | ICD-10-CM

## 2020-01-01 DIAGNOSIS — R5383 Other fatigue: Secondary | ICD-10-CM | POA: Insufficient documentation

## 2020-01-01 DIAGNOSIS — Z79899 Other long term (current) drug therapy: Secondary | ICD-10-CM | POA: Insufficient documentation

## 2020-01-01 LAB — CBC WITH DIFFERENTIAL (CANCER CENTER ONLY)
Abs Immature Granulocytes: 0.03 10*3/uL (ref 0.00–0.07)
Basophils Absolute: 0.1 10*3/uL (ref 0.0–0.1)
Basophils Relative: 1 %
Eosinophils Absolute: 0.2 10*3/uL (ref 0.0–0.5)
Eosinophils Relative: 3 %
HCT: 42.6 % (ref 36.0–46.0)
Hemoglobin: 13.2 g/dL (ref 12.0–15.0)
Immature Granulocytes: 1 %
Lymphocytes Relative: 26 %
Lymphs Abs: 1.6 10*3/uL (ref 0.7–4.0)
MCH: 29.6 pg (ref 26.0–34.0)
MCHC: 31 g/dL (ref 30.0–36.0)
MCV: 95.5 fL (ref 80.0–100.0)
Monocytes Absolute: 0.4 10*3/uL (ref 0.1–1.0)
Monocytes Relative: 7 %
Neutro Abs: 3.8 10*3/uL (ref 1.7–7.7)
Neutrophils Relative %: 62 %
Platelet Count: 64 10*3/uL — ABNORMAL LOW (ref 150–400)
RBC: 4.46 MIL/uL (ref 3.87–5.11)
RDW: 13.5 % (ref 11.5–15.5)
WBC Count: 6 10*3/uL (ref 4.0–10.5)
nRBC: 0 % (ref 0.0–0.2)

## 2020-01-01 LAB — CMP (CANCER CENTER ONLY)
ALT: 16 U/L (ref 0–44)
AST: 17 U/L (ref 15–41)
Albumin: 4.4 g/dL (ref 3.5–5.0)
Alkaline Phosphatase: 83 U/L (ref 38–126)
Anion gap: 7 (ref 5–15)
BUN: 17 mg/dL (ref 8–23)
CO2: 27 mmol/L (ref 22–32)
Calcium: 9.4 mg/dL (ref 8.9–10.3)
Chloride: 109 mmol/L (ref 98–111)
Creatinine: 1.11 mg/dL — ABNORMAL HIGH (ref 0.44–1.00)
GFR, Est AFR Am: 60 mL/min — ABNORMAL LOW (ref >60)
GFR, Estimated: 52 mL/min — ABNORMAL LOW (ref >60)
Glucose, Bld: 106 mg/dL — ABNORMAL HIGH (ref 70–99)
Potassium: 3.3 mmol/L — ABNORMAL LOW (ref 3.5–5.1)
Sodium: 143 mmol/L (ref 135–145)
Total Bilirubin: 0.4 mg/dL (ref 0.3–1.2)
Total Protein: 6.9 g/dL (ref 6.5–8.1)

## 2020-01-01 LAB — VITAMIN B12: Vitamin B-12: 329 pg/mL (ref 180–914)

## 2020-01-01 LAB — PLATELET BY CITRATE

## 2020-01-01 LAB — SAVE SMEAR(SSMR), FOR PROVIDER SLIDE REVIEW

## 2020-01-01 MED ORDER — CYANOCOBALAMIN 1000 MCG/ML IJ SOLN
1000.0000 ug | Freq: Once | INTRAMUSCULAR | Status: AC
  Administered 2020-01-01: 1000 ug via INTRAMUSCULAR

## 2020-01-01 MED ORDER — CYANOCOBALAMIN 1000 MCG/ML IJ SOLN
INTRAMUSCULAR | Status: AC
  Filled 2020-01-01: qty 1

## 2020-01-01 NOTE — Patient Instructions (Signed)

## 2020-01-01 NOTE — Progress Notes (Signed)
Hematology and Oncology Follow Up Visit  Ebony Garcia [id number] [birth year] 69 y.o. 01/01/2020   Principle Diagnosis:  Medication induced thrombocytopenia B 12 deficiency   Current Therapy:   Observation B 12 injection monthly   Interim History:  [name] is here today for follow-up and B 12 injection. She is symptomatic with fatigue.  Unfortunately her father passed away last month and this has been hard on both she and her mother. 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  She has a good bit of anxiety related to her strained family dynamic but states that she is not suicidal.   B 12 in December was 451. Hgb is stable at 13.2 and Platelet count 64.  No fever, chills, n/v, cough, rash, dizziness, SOB, chest pain, palpitations, abdominal pain or changes in bowel or bladder habits.  No swelling, tenderness, numbness or tingling in her extremities.  No falls or syncopal episodes.  Her appetite comes and goes. She is hydrating. Her weight is stable.   ECOG Performance Status: 1 - Symptomatic but completely ambulatory  Medications:  Allergies as of 01/01/2020      Reactions   Amoxicillin-pot Clavulanate Diarrhea   Antihistamines, Diphenhydramine-type Other (See Comments)   Other Reaction: makes pt nervous   Diphenhydramine Hcl Other (See Comments)   Other Reaction: makes pt nervous   Fremanezumab-vfrm Rash, Other (See Comments)   Seizure, jerking   Clindamycin Other (See Comments)   She has not taken but per patient  "her mother had a reaction and she does not want it ever"   Hydrocodone-acetaminophen Other (See Comments)   REACTION: sensitive to  but can take for extreme pain per patient   Lisinopril    Restless , irregular sleep   Nortriptyline Other (See Comments)   UNKNOWN REACTION   Propoxyphene N-acetaminophen Nausea Only   Sulfa Antibiotics Other (See Comments)   Unknown reaction   Tramadol Other (See Comments)   UNKNOWN REACTION       Medication List       Accurate as of January 01, 2020  3:07 PM. If you have any questions, ask your nurse or doctor.        ALPRAZolam 1 MG tablet Commonly known as: XANAX Take 1 tablet (1 mg total) by mouth at bedtime as needed. for sleep   Emgality 120 MG/ML Soaj Generic drug: Galcanezumab-gnlm Inject 120 mg into the skin every 30 (thirty) days. What changed: Another medication with the same name was removed. Continue taking this medication, and follow the directions you see here. Changed by: Laverna Peace, NP   FLUoxetine 20 MG capsule Commonly known as: PROZAC Take 1 capsule (20 mg total) by mouth daily.   furosemide 20 MG tablet Commonly known as: LASIX Take 1 tablet (20 mg total) by mouth daily.   metoprolol succinate 50 MG 24 hr tablet Commonly known as: TOPROL-XL TAKE 1 TABLET BY MOUTH ONCE DAILY OR  IMMEDIATELY  FOLLOWING  A  MEAL   nitroGLYCERIN 0.4 MG SL tablet Commonly known as: NITROSTAT Place 1 tablet (0.4 mg total) under the tongue every 5 (five) minutes as needed for chest pain.   promethazine 25 MG tablet Commonly known as: PHENERGAN TAKE ONE TABLET BY MOUTH EVERY 6 HOURS AS NEEDED FOR NAUSEA AND VOMITING   topiramate 100 MG tablet Commonly known as: TOPAMAX Take 1 tablet (100 mg total) by mouth 2 (two) times daily.   Tosymra 10 MG/ACT Soln Generic drug: SUMAtriptan Place 1 spray into  the nose as needed (May repeat after 1 hour if needed.  Maximum 2 sprays in 24 hours).   triamcinolone ointment 0.1 % Commonly known as: KENALOG Apply topically as needed   Zembrace SymTouch 3 MG/0.5ML Soaj Generic drug: SUMAtriptan Succinate Inject 3 mg into the skin 1 (one) time orthopaedic injection for up to 1 dose.   zolmitriptan 5 MG nasal solution Commonly known as: Zomig Place 1 spray into the nose as needed for migraine.       Allergies:  Allergies  Allergen Reactions  . Amoxicillin-Pot Clavulanate Diarrhea  . Antihistamines,  Diphenhydramine-Type Other (See Comments)    Other Reaction: makes pt nervous  . Diphenhydramine Hcl Other (See Comments)    Other Reaction: makes pt nervous  . Fremanezumab-Vfrm Rash and Other (See Comments)    Seizure, jerking  . Clindamycin Other (See Comments)    She has not taken but per patient  "her mother had a reaction and she does not want it ever"  . Hydrocodone-Acetaminophen Other (See Comments)    REACTION: sensitive to  but can take for extreme pain per patient  . Lisinopril     Restless , irregular sleep  . Nortriptyline Other (See Comments)    UNKNOWN REACTION  . Propoxyphene N-Acetaminophen Nausea Only  . Sulfa Antibiotics Other (See Comments)    Unknown reaction  . Tramadol Other (See Comments)    UNKNOWN REACTION    Past Medical History, Surgical history, Social history, and Family History were reviewed and updated.  Review of Systems: All other 10 point review of systems is negative.   Physical Exam:  height is 5\' 2"  (1.575 m) and weight is 183 lb (83 kg). Her temporal temperature is 97.1 F (36.2 C) (abnormal). Her blood pressure is 140/87 and her pulse is 54 (abnormal). Her respiration is 18 and oxygen saturation is 100%.   Wt Readings from Last 3 Encounters:  01/01/20 183 lb (83 kg)  08/18/19 177 lb (80.3 kg)  08/03/19 176 lb 9.6 oz (80.1 kg)    Ocular: Sclerae unicteric, pupils equal, round and reactive to light Ear-nose-throat: Oropharynx clear, dentition fair Lymphatic: No cervical or supraclavicular adenopathy Lungs no rales or rhonchi, good excursion bilaterally Heart regular rate and rhythm, no murmur appreciated Abd soft, nontender, positive bowel sounds, no liver or spleen tip palpated on exam, no fluid wave  MSK no focal spinal tenderness, no joint edema Neuro: non-focal, well-oriented, appropriate affect Breasts: Deferred   Lab Results  Component Value Date   WBC 6.0 01/01/2020   HGB 13.2 01/01/2020   HCT 42.6 01/01/2020   MCV 95.5  01/01/2020   PLT 64 (L) 01/01/2020   No results found for: FERRITIN, IRON, TIBC, UIBC, IRONPCTSAT Lab Results  Component Value Date   RBC 4.46 01/01/2020   No results found for: KPAFRELGTCHN, LAMBDASER, KAPLAMBRATIO No results found for: IGGSERUM, IGA, IGMSERUM No results found for: Odetta Pink, SPEI   Chemistry      Component Value Date/Time   NA 143 01/01/2020 1328   NA 143 11/11/2017 1339   K 3.3 (L) 01/01/2020 1328   K 3.6 11/11/2017 1339   CL 109 01/01/2020 1328   CL 109 (H) 11/11/2017 1339   CO2 27 01/01/2020 1328   CO2 24 11/11/2017 1339   BUN 17 01/01/2020 1328   BUN 9 11/11/2017 1339   CREATININE 1.11 (H) 01/01/2020 1328   CREATININE 1.0 11/11/2017 1339      Component Value Date/Time  CALCIUM 9.4 01/01/2020 1328   CALCIUM 9.3 11/11/2017 1339   ALKPHOS 83 01/01/2020 1328   ALKPHOS 85 (H) 11/11/2017 1339   AST 17 01/01/2020 1328   ALT 16 01/01/2020 1328   ALT 24 11/11/2017 1339   BILITOT 0.4 01/01/2020 1328       Impression and Plan: [name] is a pleasant 69 yo caucasian female with thrombocytopenia and B 12 deficiency.  She received her B 12 injection today and will continue her once a month schedule.  Follow-up in 3 months.  She will contact our office with any questions or concerns. We can certainly see her sooner if needed.   Laverna Peace, NP 1/25/20213:07 PM

## 2020-01-02 ENCOUNTER — Telehealth: Payer: Self-pay | Admitting: Family

## 2020-01-02 ENCOUNTER — Telehealth: Payer: Self-pay

## 2020-01-02 NOTE — Telephone Encounter (Signed)
Called and LMVM for patient regarding appointments scheduled per 1/25 los

## 2020-01-02 NOTE — Telephone Encounter (Signed)
Faxed form received from [employer]- completed and faxed to [phone]. Received response that Pt's insurance termed on 12/07/2019.

## 2020-02-01 ENCOUNTER — Inpatient Hospital Stay: Payer: Medicare HMO | Attending: Hematology & Oncology

## 2020-02-01 ENCOUNTER — Other Ambulatory Visit: Payer: Self-pay

## 2020-02-01 VITALS — BP 127/73 | HR 52 | Temp 98.0°F | Resp 18

## 2020-02-01 DIAGNOSIS — E538 Deficiency of other specified B group vitamins: Secondary | ICD-10-CM | POA: Diagnosis not present

## 2020-02-01 MED ORDER — CYANOCOBALAMIN 1000 MCG/ML IJ SOLN
INTRAMUSCULAR | Status: AC
  Filled 2020-02-01: qty 1

## 2020-02-01 MED ORDER — CYANOCOBALAMIN 1000 MCG/ML IJ SOLN
1000.0000 ug | Freq: Once | INTRAMUSCULAR | Status: AC
  Administered 2020-02-01: 1000 ug via INTRAMUSCULAR

## 2020-02-01 NOTE — Patient Instructions (Signed)

## 2020-02-02 ENCOUNTER — Other Ambulatory Visit: Payer: Self-pay | Admitting: Family Medicine

## 2020-02-02 DIAGNOSIS — R002 Palpitations: Secondary | ICD-10-CM

## 2020-02-02 DIAGNOSIS — F419 Anxiety disorder, unspecified: Secondary | ICD-10-CM

## 2020-02-02 DIAGNOSIS — I10 Essential (primary) hypertension: Secondary | ICD-10-CM

## 2020-02-02 DIAGNOSIS — I5189 Other ill-defined heart diseases: Secondary | ICD-10-CM

## 2020-02-08 NOTE — Progress Notes (Signed)
NEUROLOGY FOLLOW UP OFFICE NOTE  Ebony Garcia [id number]  HISTORY OF PRESENT ILLNESS: Ebony Garcia a 69 year old right-handed woman with hypertension, CHF, renal calculi, depression and hyperlipidemia whofollows up for migraines.  UPDATE To assess new onset headaches, CTA of head performed on 07/05/2019 which was personally reviewed and was unremarkable for mass lesion, high-grade arterial stenosis, aneurysm or other acute intracranial abnormality.  She is no longer on Emgality due to cost.  But migraines are much improved.  No recent severe headaches.  1 in AM 2 cups of coffee and they are gone.  She is feeling happy.  She has started a new relationship. Current NSAIDS:no Current analgesics: no. Current triptans:Zomig 5mg  NS Current anti-emetic: Phenergan 25mg  Current muscle relaxants: no Current anti-anxiolytic: Xanax 1mg  Current sleep aide: Xanax 1mg  Current Antihypertensive medications: Toprol XL 50mg , Lasix Current Antidepressant medications: Prozac 20mg  Current Anticonvulsant medications: topiramate 100mg  BID Current anti-CGRP: none  Depression and anxiety: Yes She reports off and on body aches, paresthesias, head pains. Self-quarantined. Sedentary. She reports weight gain, which she attributes to the [facility].  HISTORY: Onset: Since her late 66s. Location: usually left sided, periorbital, and back of head.More recently, holocephalic Quality:Sharp, aching pressure.Non-throbbing Initial intensity:7/10; Sept: 7/10 Associated symptoms: Nausea,photophobia and phonophobia.No visual disturbance, osmophobia,vomitingor unilateral numbness or weakness Aura:no Initial duration:3 days; Sept: 30 minutes with Maxalt Initial frequency:25 headache days per month Triggers: Emotional stress Relieving factors: Cold mask, heating pack on neck and shoulders  Past NSAIDs: naproxen (ineffective), indomethacin, toradol tablet  (ineffective), Toradol 60mg  IM (effective), Cambia (side effects) Past analgesics: Tylenol (ineffective), Excedrin (ineffective), Lidocaine nasal drops (effective but caused hives), tramadol (side effects), Midrin (effective but made her sleepy) Past triptans/ergots: sumatriptan 100mg , Zomig 5mg  (effective), Frova, Relpax, DHE (effective), Maxalt (effective but unable to afford it),Tosymra NS Past antiemetic: Reglan Past anxiolytics: Vicodin Past muscle relaxants: cyclobenzaprine Past antihypertensives: propranolol (ineffective), atenolol (ineffective), lisinopril, losartan, HCTZ Past antidepressants: amitriptyline (side effects), venlafaxine (ineffective), nortriptyline (effective but side effects such as rash), imipramine Past antiepileptics: Depakote (hair loss), lamotrigine (side effects), zonisamide (side effects), gabapentin, Keppra Past CGRP inhibitor: Aimovig (effective but concerned about side effects- ankle jerking, abnormal skin pattern), Ajovy (same side effects); Emgality (cost) Past vitamins and supplements: magnesium (ineffective), feverfew (ineffective) Other past therapy: Botox (one round, flu like symptoms, unable to afford 20% copay), occipital nerve blocks, trigger point injections, alternating heat/ice  Family history of headache:Mother, grandmother, great-aunt, cousins.  08/16/08 MRI Brain w/wo performed for "explosive headaches": nonspecific punctate hyperintensities in the subcortical white matter. 08/16/08 MRA Head: Unremarkable.There is mild attenuation in the vertebrobasilar junction and proximal basilar artery which is likely artifact.  PAST MEDICAL HISTORY: Past Medical History:  Diagnosis Date  . Cancer ([place])   . CHF (congestive heart failure) ([place])    dystolic heart dysfunction  . Chronic kidney disease   . Depression   . Depression with anxiety   . Diabetes mellitus without complication ([place])    Diet controlled  . Hypertension   .  Intertrigo   . Migraines   . Panic attacks     MEDICATIONS: Current Outpatient Medications on File Prior to Visit  Medication Sig Dispense Refill  . ALPRAZolam (XANAX) 1 MG tablet Take 1 tablet (1 mg total) by mouth at bedtime as needed. for sleep 30 tablet 1  . FLUoxetine (PROZAC) 20 MG capsule TAKE 1 CAPSULE EVERY DAY (NEED OFFICE VISIT FOR MORE REFILLS) 90 capsule 0  . furosemide (LASIX) 20 MG tablet TAKE 1 TABLET EVERY DAY (NEED  OFFICE VISIT FOR MORE REFILLS) 90 tablet 0  . Galcanezumab-gnlm (EMGALITY) 120 MG/ML SOAJ Inject 120 mg into the skin every 30 (thirty) days. (Patient not taking: Reported on 01/01/2020) 2 pen 3  . metoprolol succinate (TOPROL-XL) 50 MG 24 hr tablet TAKE 1 TABLET ONE TIME DAILY IMMEDIATELY FOLLOWING A MEAL (NEED OFFICE VISIT FOR MORE REFILLS) 90 tablet 0  . nitroGLYCERIN (NITROSTAT) 0.4 MG SL tablet Place 1 tablet (0.4 mg total) under the tongue every 5 (five) minutes as needed for chest pain. 25 tablet 3  . promethazine (PHENERGAN) 25 MG tablet TAKE ONE TABLET BY MOUTH EVERY 6 HOURS AS NEEDED FOR NAUSEA AND VOMITING 30 tablet 2  . SUMAtriptan (TOSYMRA) 10 MG/ACT SOLN Place 1 spray into the nose as needed (May repeat after 1 hour if needed.  Maximum 2 sprays in 24 hours). 6 each 5  . SUMAtriptan Succinate (ZEMBRACE SYMTOUCH) 3 MG/0.5ML SOAJ Inject 3 mg into the skin 1 (one) time orthopaedic injection for up to 1 dose. 2 pen 0  . topiramate (TOPAMAX) 100 MG tablet Take 1 tablet (100 mg total) by mouth 2 (two) times daily. 180 tablet 1  . triamcinolone ointment (KENALOG) 0.1 % Apply topically as needed    . zolmitriptan (ZOMIG) 5 MG nasal solution Place 1 spray into the nose as needed for migraine. 3 Units 5   No current facility-administered medications on file prior to visit.    ALLERGIES: Allergies  Allergen Reactions  . Amoxicillin-Pot Clavulanate Diarrhea  . Antihistamines, Diphenhydramine-Type Other (See Comments)    Other Reaction: makes pt nervous  .  Diphenhydramine Hcl Other (See Comments)    Other Reaction: makes pt nervous  . Fremanezumab-Vfrm Rash and Other (See Comments)    Seizure, jerking  . Clindamycin Other (See Comments)    She has not taken but per patient  "her mother had a reaction and she does not want it ever"  . Hydrocodone-Acetaminophen Other (See Comments)    REACTION: sensitive to  but can take for extreme pain per patient  . Lisinopril     Restless , irregular sleep  . Nortriptyline Other (See Comments)    UNKNOWN REACTION  . Propoxyphene N-Acetaminophen Nausea Only  . Sulfa Antibiotics Other (See Comments)    Unknown reaction  . Tramadol Other (See Comments)    UNKNOWN REACTION    FAMILY HISTORY: Family History  Problem Relation Age of Onset  . Crohn's disease Mother   . Atrial fibrillation Mother   . Heart failure Mother   . Bladder Cancer Father   . Cancer Father        bladder  . Bipolar disorder Brother   . Cancer Brother   . Bipolar disorder Sister   . Heart attack Maternal Grandmother        >65  . Cancer Maternal Aunt   . Colon cancer Maternal Uncle   . Cancer Paternal Grandfather   . Cancer Maternal Aunt   . Cancer Cousin   . Anemia Cousin   . Diabetes Neg Hx   . Stroke Neg Hx   .  SOCIAL HISTORY: Social History   Socioeconomic History  . Marital status: Divorced    Spouse name: Not on file  . Number of children: 3  . Years of education: Not on file  . Highest education level: Not on file  Occupational History    Employer: UNEMPLOYED  Tobacco Use  . Smoking status: Never Smoker  . Smokeless tobacco: Never Used  Substance and  Sexual Activity  . Alcohol use: No    Alcohol/week: 0.0 standard drinks  . Drug use: No  . Sexual activity: Not Currently    Partners: Male  Other Topics Concern  . Not on file  Social History Narrative   Lives alone.        Patient is right-handed. She lives on the first floor.      Social Determinants of Health   Financial Resource Strain:    . Difficulty of Paying Living Expenses: Not on file  Food Insecurity:   . Worried About Charity fundraiser in the Last Year: Not on file  . Ran Out of Food in the Last Year: Not on file  Transportation Needs:   . Lack of Transportation (Medical): Not on file  . Lack of Transportation (Non-Medical): Not on file  Physical Activity:   . Days of Exercise per Week: Not on file  . Minutes of Exercise per Session: Not on file  Stress:   . Feeling of Stress : Not on file  Social Connections:   . Frequency of Communication with Friends and Family: Not on file  . Frequency of Social Gatherings with Friends and Family: Not on file  . Attends Religious Services: Not on file  . Active Member of Clubs or Organizations: Not on file  . Attends Archivist Meetings: Not on file  . Marital Status: Not on file  Intimate Partner Violence:   . Fear of Current or Ex-Partner: Not on file  . Emotionally Abused: Not on file  . Physically Abused: Not on file  . Sexually Abused: Not on file    PHYSICAL EXAM: Blood pressure (!) 142/94, pulse 60, height 5\' 2"  (1.575 m), weight 177 lb (80.3 kg), SpO2 97 %. General: No acute distress.  Patient appears well-groomed.   Head:  Normocephalic/atraumatic Eyes:  Fundi examined but not visualized Neck: supple, no paraspinal tenderness, full range of motion Heart:  Regular rate and rhythm Lungs:  Clear to auscultation bilaterally Back: No paraspinal tenderness Neurological Exam: alert and oriented to person, place, and time. Attention span and concentration intact, recent and remote memory intact, fund of knowledge intact.  Speech fluent and not dysarthric, language intact.  CN II-XII intact. Bulk and tone normal, muscle strength 5/5 throughout.  Sensation to light touch, temperature and vibration intact.  Deep tendon reflexes 2+ throughout, toes downgoing.  Finger to nose and heel to shin testing intact.  Gait normal, Romberg negative.  IMPRESSION: 1.   migraine without aura, without status migrainosus, not intractable   PLAN: 1.  For preventative management, topiramate 100mg  twice daily 2.  For abortive therapy, Zomig 5mg  NS 3.  Limit use of pain relievers to no more than 2 days out of week to prevent risk of rebound or medication-overuse headache. 4.  Keep headache diary 5.  Exercise, hydration, caffeine cessation, sleep hygiene, monitor for and avoid triggers 6.  Follow up 6 months   Metta Clines, DO  CC: Roma Schanz, DO

## 2020-02-09 ENCOUNTER — Ambulatory Visit: Payer: Medicare HMO | Admitting: Neurology

## 2020-02-09 ENCOUNTER — Encounter: Payer: Self-pay | Admitting: Neurology

## 2020-02-09 ENCOUNTER — Other Ambulatory Visit: Payer: Self-pay

## 2020-02-09 VITALS — BP 142/94 | HR 60 | Ht 62.0 in | Wt 177.0 lb

## 2020-02-09 DIAGNOSIS — G43009 Migraine without aura, not intractable, without status migrainosus: Secondary | ICD-10-CM | POA: Diagnosis not present

## 2020-02-09 NOTE — Patient Instructions (Signed)
topiramate 100mg  twice daily Limit use of pain relievers to no more than 2 days out of week to prevent risk of rebound or medication-overuse headache. Follow up in 6 months

## 2020-02-13 ENCOUNTER — Telehealth: Payer: Self-pay | Admitting: Neurology

## 2020-02-13 NOTE — Telephone Encounter (Signed)
Patient called and said, "Please let Dr. Tomi Likens know I am so very thankful for the monthly samples over the last year. It has been a [employer]all[employer]. I forgot to tell him at my last appointment but it's important he know how grateful I am. There is not need to call back."

## 2020-02-19 NOTE — Telephone Encounter (Signed)
3/2 mychart message came back as unread. Called pt and scheduled in person f/u for 02/23/20 at 2:20pm.

## 2020-02-23 ENCOUNTER — Ambulatory Visit (INDEPENDENT_AMBULATORY_CARE_PROVIDER_SITE_OTHER): Payer: Medicare HMO | Admitting: Family Medicine

## 2020-02-23 ENCOUNTER — Encounter: Payer: Self-pay | Admitting: Family Medicine

## 2020-02-23 ENCOUNTER — Other Ambulatory Visit: Payer: Self-pay

## 2020-02-23 VITALS — BP 108/80 | HR 69 | Temp 97.4°F | Resp 18 | Ht 62.0 in | Wt 171.8 lb

## 2020-02-23 DIAGNOSIS — I5031 Acute diastolic (congestive) heart failure: Secondary | ICD-10-CM

## 2020-02-23 DIAGNOSIS — F418 Other specified anxiety disorders: Secondary | ICD-10-CM

## 2020-02-23 DIAGNOSIS — I10 Essential (primary) hypertension: Secondary | ICD-10-CM

## 2020-02-23 DIAGNOSIS — F419 Anxiety disorder, unspecified: Secondary | ICD-10-CM

## 2020-02-23 DIAGNOSIS — E785 Hyperlipidemia, unspecified: Secondary | ICD-10-CM

## 2020-02-23 DIAGNOSIS — D696 Thrombocytopenia, unspecified: Secondary | ICD-10-CM | POA: Diagnosis not present

## 2020-02-23 MED ORDER — ALPRAZOLAM 1 MG PO TABS: 1.0000 mg | ORAL_TABLET | Freq: Every evening | ORAL | 1 refills | Status: DC | PRN

## 2020-02-23 NOTE — Progress Notes (Signed)
Patient ID: Ebony Garcia, female    DOB: Jun 13, 1951  Age: 69 y.o. MRN: [id number]    Subjective:  Subjective  HPI Ebony Garcia presents for bp and chol , anxiety f/u.  She is doing great    She has started seeing an old HS friend and has been very happy.   No complaints   Review of Systems  Constitutional: Negative for appetite change, diaphoresis, fatigue and unexpected weight change.  Eyes: Negative for pain, redness and visual disturbance.  Respiratory: Negative for cough, chest tightness, shortness of breath and wheezing.   Cardiovascular: Negative for chest pain, palpitations and leg swelling.  Endocrine: Negative for cold intolerance, heat intolerance, polydipsia, polyphagia and polyuria.  Genitourinary: Negative for difficulty urinating, dysuria and frequency.  Neurological: Negative for dizziness, light-headedness, numbness and headaches.  Psychiatric/Behavioral: Negative for decreased concentration, dysphoric mood, self-injury, sleep disturbance and suicidal ideas. The patient is not nervous/anxious.     History Past Medical History:  Diagnosis Date  . Cancer ([place])   . CHF (congestive heart failure) ([place])    dystolic heart dysfunction  . Chronic kidney disease   . Depression   . Depression with anxiety   . Diabetes mellitus without complication ([place])    Diet controlled  . Hypertension   . Intertrigo   . Migraines   . Panic attacks     She has a past surgical history that includes Total abdominal hysterectomy w/ bilateral salpingoophorectomy; Tonsillectomy and adenoidectomy; Nasal septum surgery; Tubal ligation; Dilation and curettage of uterus; papaloma (Right); Breast surgery; and Abdominal hysterectomy.   Her family history includes Anemia in her cousin; Atrial fibrillation in her mother; Bipolar disorder in her brother and sister; Bladder Cancer in her father; Cancer in her brother, cousin, father, maternal aunt, maternal aunt, and paternal grandfather;  Colon cancer in her maternal uncle; Crohn's disease in her mother; Heart attack in her maternal grandmother; Heart failure in her mother.She reports that she has never smoked. She has never used smokeless tobacco. She reports that she does not drink alcohol or use drugs.  Current Outpatient Medications on File Prior to Visit  Medication Sig Dispense Refill  . FLUoxetine (PROZAC) 20 MG capsule TAKE 1 CAPSULE EVERY DAY (NEED OFFICE VISIT FOR MORE REFILLS) 90 capsule 0  . furosemide (LASIX) 20 MG tablet TAKE 1 TABLET EVERY DAY (NEED OFFICE VISIT FOR MORE REFILLS) 90 tablet 0  . metoprolol succinate (TOPROL-XL) 50 MG 24 hr tablet TAKE 1 TABLET ONE TIME DAILY IMMEDIATELY FOLLOWING A MEAL (NEED OFFICE VISIT FOR MORE REFILLS) 90 tablet 0  . nitroGLYCERIN (NITROSTAT) 0.4 MG SL tablet Place 1 tablet (0.4 mg total) under the tongue every 5 (five) minutes as needed for chest pain. 25 tablet 3  . promethazine (PHENERGAN) 25 MG tablet TAKE ONE TABLET BY MOUTH EVERY 6 HOURS AS NEEDED FOR NAUSEA AND VOMITING 30 tablet 2  . SUMAtriptan (TOSYMRA) 10 MG/ACT SOLN Place 1 spray into the nose as needed (May repeat after 1 hour if needed.  Maximum 2 sprays in 24 hours). 6 each 5  . topiramate (TOPAMAX) 100 MG tablet Take 1 tablet (100 mg total) by mouth 2 (two) times daily. 180 tablet 1  . triamcinolone ointment (KENALOG) 0.1 % Apply topically as needed    . zolmitriptan (ZOMIG) 5 MG nasal solution Place 1 spray into the nose as needed for migraine. 3 Units 5  . SUMAtriptan Succinate (ZEMBRACE SYMTOUCH) 3 MG/0.5ML SOAJ Inject 3 mg into the skin 1 (one) time  orthopaedic injection for up to 1 dose. 2 pen 0   No current facility-administered medications on file prior to visit.     Objective:  Objective  Physical Exam Vitals and nursing note reviewed.  Constitutional:      General: She is not in acute distress.    Appearance: She is well-developed. She is not diaphoretic.  HENT:     Head: Normocephalic and  atraumatic.  Eyes:     General:        Right eye: No discharge.        Left eye: No discharge.     Conjunctiva/sclera: Conjunctivae normal.     Pupils: Pupils are equal, round, and reactive to light.  Neck:     Thyroid: No thyromegaly.     Vascular: No carotid bruit or JVD.  Cardiovascular:     Rate and Rhythm: Normal rate and regular rhythm.     Heart sounds: Normal heart sounds. No murmur.  Pulmonary:     Effort: Pulmonary effort is normal. No respiratory distress.     Breath sounds: Normal breath sounds. No wheezing or rales.  Chest:     Chest wall: No tenderness.  Abdominal:     General: Bowel sounds are normal. There is no distension.     Palpations: Abdomen is soft. There is no mass.     Tenderness: There is no abdominal tenderness. There is no guarding or rebound.  Genitourinary:    Vagina: Normal.  Musculoskeletal:     Cervical back: Normal range of motion and neck supple.  Lymphadenopathy:     Cervical: No cervical adenopathy.  Skin:    General: Skin is warm and dry.     Findings: No erythema or rash.  Neurological:     Mental Status: She is alert and oriented to person, place, and time.     Cranial Nerves: No cranial nerve deficit.     Deep Tendon Reflexes: Reflexes are normal and symmetric.  Psychiatric:        Behavior: Behavior normal.        Thought Content: Thought content normal.        Judgment: Judgment normal.    BP 108/80 (BP Location: Right Arm, Patient Position: Sitting, Cuff Size: Large)   Pulse 69   Temp (!) 97.4 F (36.3 C) (Temporal)   Resp 18   Ht 5\' 2"  (1.575 m)   Wt 171 lb 12.8 oz (77.9 kg)   SpO2 97%   BMI 31.42 kg/m  Wt Readings from Last 3 Encounters:  02/23/20 171 lb 12.8 oz (77.9 kg)  02/09/20 177 lb (80.3 kg)  01/01/20 183 lb (83 kg)     Lab Results  Component Value Date   WBC 6.0 01/01/2020   HGB 13.2 01/01/2020   HCT 42.6 01/01/2020   PLT 64 (L) 01/01/2020   GLUCOSE 144 (H) 02/23/2020   CHOL 187 02/23/2020   TRIG  147 02/23/2020   HDL 53 02/23/2020   LDLDIRECT 121.0 09/20/2017   LDLCALC 108 (H) 02/23/2020   ALT 13 02/23/2020   AST 15 02/23/2020   NA 140 02/23/2020   K 3.5 02/23/2020   CL 108 02/23/2020   CREATININE 1.02 (H) 02/23/2020   BUN 15 02/23/2020   CO2 21 02/23/2020   TSH 3.62 11/03/2016   INR 0.93 07/19/2010   HGBA1C 5.9 12/08/2012   MICROALBUR 1.1 09/20/2017    CT ANGIO HEAD W OR WO CONTRAST  Result Date: 07/06/2019 CLINICAL DATA:  New onset of  headaches after age 80. New onset headache over 50, evaluate for aneurysm. Additional history: Patient "off balance" , blurred vision, dizzy EXAM: CT ANGIOGRAPHY HEAD TECHNIQUE: Multidetector CT imaging of the head was performed using the standard protocol during bolus administration of intravenous contrast. Multiplanar CT image reconstructions and MIPs were obtained to evaluate the vascular anatomy. CONTRAST:  24mL ISOVUE-370 IOPAMIDOL (ISOVUE-370) INJECTION 76% COMPARISON:  Brain MRI 08/27/2015 FINDINGS: CT HEAD Brain: There is no acute intracranial hemorrhage or demarcated territorial infarction.No evidence of intracranial mass.No midline shift or extra-axial collection. Cerebral volume is age appropriate. Vascular: Reported separately Skull: Normal. Negative for fracture or focal lesion. Sinuses: No significant paranasal sinus disease or mastoid effusion. Orbits: The imaged globes and orbits are unremarkable. CTA HEAD Anterior circulation: No large vessel occlusion or proximal high-grade arterial stenosis.No intracranial aneurysm is identified. Posterior circulation: No large vessel occlusion or proximal high-grade arterial stenosis.No intracranial aneurysm is identified. Venous sinuses: Within the limitations of contrast timing, no evidence of thrombosis. Anatomic variants: Diminutive intracranial left vertebral artery beyond the left PICA origin. Predominantly fetal origin of the bilateral posterior cerebral arteries. IMPRESSION: CT HEAD:  Unremarkable non-contrast CT appearance of the brain. No evidence of intracranial mass or acute intracranial abnormality. CTA HEAD: No large vessel occlusion or proximal high-grade arterial stenosis. No intracranial aneurysm is identified. Electronically Signed   By: Kellie Simmering   On: 07/06/2019 10:33     Assessment & Plan:  Plan  I am having Nevin Bloodgood T. Garcia maintain her nitroGLYCERIN, triamcinolone ointment, zolmitriptan, topiramate, Zembrace SymTouch, Tosymra, promethazine, furosemide, metoprolol succinate, FLUoxetine, and ALPRAZolam.  Meds ordered this encounter  Medications  . ALPRAZolam (XANAX) 1 MG tablet    Sig: Take 1 tablet (1 mg total) by mouth at bedtime as needed. for sleep    Dispense:  30 tablet    Refill:  1    Problem List Items Addressed This Visit      Unprioritized   Acute diastolic CHF (congestive heart failure) (HCC)    con't lasix and metoprolol stable      Anxiety   Relevant Medications   ALPRAZolam (XANAX) 1 MG tablet   Depression with anxiety    Stable con't meds       Relevant Medications   ALPRAZolam (XANAX) 1 MG tablet   Essential hypertension    Well controlled, no changes to meds. Encouraged heart healthy diet such as the DASH diet and exercise as tolerated.       Hyperlipidemia - Primary    Encouraged heart healthy diet, increase exercise, avoid trans fats, consider a krill oil cap daily      Relevant Orders   Comprehensive metabolic panel (Completed)   Lipid panel (Completed)   Thrombocytopenia ([place])    Per hematology         Follow-up: Return in about 6 months (around 08/25/2020), or if symptoms worsen or fail to improve, for annual exam, fasting.  [name], DO

## 2020-02-23 NOTE — Patient Instructions (Signed)

## 2020-02-24 LAB — COMPREHENSIVE METABOLIC PANEL
AG Ratio: 2 (calc) (ref 1.0–2.5)
ALT: 13 U/L (ref 6–29)
AST: 15 U/L (ref 10–35)
Albumin: 4.3 g/dL (ref 3.6–5.1)
Alkaline phosphatase (APISO): 74 U/L (ref 37–153)
BUN/Creatinine Ratio: 15 (calc) (ref 6–22)
BUN: 15 mg/dL (ref 7–25)
CO2: 21 mmol/L (ref 20–32)
Calcium: 8.6 mg/dL (ref 8.6–10.4)
Chloride: 108 mmol/L (ref 98–110)
Creat: 1.02 mg/dL — ABNORMAL HIGH (ref 0.50–0.99)
Globulin: 2.2 g/dL (calc) (ref 1.9–3.7)
Glucose, Bld: 144 mg/dL — ABNORMAL HIGH (ref 65–99)
Potassium: 3.5 mmol/L (ref 3.5–5.3)
Sodium: 140 mmol/L (ref 135–146)
Total Bilirubin: 0.4 mg/dL (ref 0.2–1.2)
Total Protein: 6.5 g/dL (ref 6.1–8.1)

## 2020-02-24 LAB — LIPID PANEL
Cholesterol: 187 mg/dL (ref <200)
HDL: 53 mg/dL (ref >=50)
LDL Cholesterol (Calc): 108 mg/dL (calc) — ABNORMAL HIGH
Non-HDL Cholesterol (Calc): 134 mg/dL (calc) — ABNORMAL HIGH (ref <130)
Total CHOL/HDL Ratio: 3.5 (calc) (ref <5.0)
Triglycerides: 147 mg/dL (ref <150)

## 2020-02-25 ENCOUNTER — Other Ambulatory Visit: Payer: Self-pay | Admitting: Family Medicine

## 2020-02-25 DIAGNOSIS — R739 Hyperglycemia, unspecified: Secondary | ICD-10-CM

## 2020-02-25 NOTE — Assessment & Plan Note (Signed)
con't lasix and metoprolol stable

## 2020-02-25 NOTE — Assessment & Plan Note (Signed)
Well controlled, no changes to meds. Encouraged heart healthy diet such as the DASH diet and exercise as tolerated.  °

## 2020-02-25 NOTE — Assessment & Plan Note (Signed)
Encouraged heart healthy diet, increase exercise, avoid trans fats, consider a krill oil cap daily 

## 2020-02-25 NOTE — Assessment & Plan Note (Signed)
Per hematology 

## 2020-02-25 NOTE — Assessment & Plan Note (Signed)
Stable con't meds 

## 2020-02-29 ENCOUNTER — Inpatient Hospital Stay: Payer: Medicare HMO | Attending: Hematology & Oncology

## 2020-02-29 ENCOUNTER — Other Ambulatory Visit: Payer: Self-pay

## 2020-02-29 VITALS — BP 139/76 | HR 64 | Temp 96.9°F | Resp 16

## 2020-02-29 DIAGNOSIS — E538 Deficiency of other specified B group vitamins: Secondary | ICD-10-CM | POA: Diagnosis not present

## 2020-02-29 MED ORDER — CYANOCOBALAMIN 1000 MCG/ML IJ SOLN
1000.0000 ug | Freq: Once | INTRAMUSCULAR | Status: AC
  Administered 2020-02-29: 1000 ug via INTRAMUSCULAR

## 2020-02-29 NOTE — Patient Instructions (Signed)

## 2020-03-13 ENCOUNTER — Other Ambulatory Visit: Payer: Self-pay

## 2020-03-13 ENCOUNTER — Other Ambulatory Visit (INDEPENDENT_AMBULATORY_CARE_PROVIDER_SITE_OTHER): Payer: Medicare HMO

## 2020-03-13 DIAGNOSIS — R739 Hyperglycemia, unspecified: Secondary | ICD-10-CM

## 2020-03-13 LAB — BASIC METABOLIC PANEL
BUN: 22 mg/dL (ref 6–23)
CO2: 25 mEq/L (ref 19–32)
Calcium: 9 mg/dL (ref 8.4–10.5)
Chloride: 108 mEq/L (ref 96–112)
Creatinine, Ser: 1.02 mg/dL (ref 0.40–1.20)
GFR: 54.14 mL/min — ABNORMAL LOW (ref >60.00)
Glucose, Bld: 122 mg/dL — ABNORMAL HIGH (ref 70–99)
Potassium: 4 mEq/L (ref 3.5–5.1)
Sodium: 142 mEq/L (ref 135–145)

## 2020-03-13 LAB — HEMOGLOBIN A1C: Hgb A1c MFr Bld: 5.6 % (ref 4.6–6.5)

## 2020-04-01 ENCOUNTER — Telehealth: Payer: Self-pay | Admitting: Family

## 2020-04-01 ENCOUNTER — Inpatient Hospital Stay (HOSPITAL_BASED_OUTPATIENT_CLINIC_OR_DEPARTMENT_OTHER): Payer: Medicare HMO | Admitting: Family

## 2020-04-01 ENCOUNTER — Inpatient Hospital Stay: Payer: Medicare HMO

## 2020-04-01 ENCOUNTER — Other Ambulatory Visit: Payer: Self-pay

## 2020-04-01 ENCOUNTER — Inpatient Hospital Stay: Payer: Medicare HMO | Attending: Hematology & Oncology

## 2020-04-01 VITALS — BP 116/77 | HR 57 | Temp 97.1°F | Resp 19 | Wt 162.0 lb

## 2020-04-01 DIAGNOSIS — D696 Thrombocytopenia, unspecified: Secondary | ICD-10-CM

## 2020-04-01 DIAGNOSIS — E538 Deficiency of other specified B group vitamins: Secondary | ICD-10-CM

## 2020-04-01 DIAGNOSIS — Z79899 Other long term (current) drug therapy: Secondary | ICD-10-CM | POA: Diagnosis not present

## 2020-04-01 DIAGNOSIS — D6959 Other secondary thrombocytopenia: Secondary | ICD-10-CM | POA: Diagnosis not present

## 2020-04-01 LAB — CBC WITH DIFFERENTIAL (CANCER CENTER ONLY)
Abs Immature Granulocytes: 0.01 10*3/uL (ref 0.00–0.07)
Basophils Absolute: 0.1 10*3/uL (ref 0.0–0.1)
Basophils Relative: 1 %
Eosinophils Absolute: 0.1 10*3/uL (ref 0.0–0.5)
Eosinophils Relative: 2 %
HCT: 43.2 % (ref 36.0–46.0)
Hemoglobin: 13.7 g/dL (ref 12.0–15.0)
Immature Granulocytes: 0 %
Lymphocytes Relative: 21 %
Lymphs Abs: 1.4 10*3/uL (ref 0.7–4.0)
MCH: 30.3 pg (ref 26.0–34.0)
MCHC: 31.7 g/dL (ref 30.0–36.0)
MCV: 95.6 fL (ref 80.0–100.0)
Monocytes Absolute: 0.4 10*3/uL (ref 0.1–1.0)
Monocytes Relative: 6 %
Neutro Abs: 4.7 10*3/uL (ref 1.7–7.7)
Neutrophils Relative %: 70 %
Platelet Count: 90 10*3/uL — ABNORMAL LOW (ref 150–400)
RBC: 4.52 MIL/uL (ref 3.87–5.11)
RDW: 13.4 % (ref 11.5–15.5)
WBC Count: 6.7 10*3/uL (ref 4.0–10.5)
nRBC: 0 % (ref 0.0–0.2)

## 2020-04-01 LAB — CMP (CANCER CENTER ONLY)
ALT: 22 U/L (ref 0–44)
AST: 23 U/L (ref 15–41)
Albumin: 4.5 g/dL (ref 3.5–5.0)
Alkaline Phosphatase: 68 U/L (ref 38–126)
Anion gap: 9 (ref 5–15)
BUN: 15 mg/dL (ref 8–23)
CO2: 25 mmol/L (ref 22–32)
Calcium: 9.8 mg/dL (ref 8.9–10.3)
Chloride: 107 mmol/L (ref 98–111)
Creatinine: 1.06 mg/dL — ABNORMAL HIGH (ref 0.44–1.00)
GFR, Est AFR Am: >60 mL/min (ref >60)
GFR, Estimated: 55 mL/min — ABNORMAL LOW (ref >60)
Glucose, Bld: 146 mg/dL — ABNORMAL HIGH (ref 70–99)
Potassium: 3.9 mmol/L (ref 3.5–5.1)
Sodium: 141 mmol/L (ref 135–145)
Total Bilirubin: 0.3 mg/dL (ref 0.3–1.2)
Total Protein: 7 g/dL (ref 6.5–8.1)

## 2020-04-01 LAB — PLATELET BY CITRATE

## 2020-04-01 LAB — VITAMIN B12: Vitamin B-12: 426 pg/mL (ref 180–914)

## 2020-04-01 MED ORDER — CYANOCOBALAMIN 1000 MCG/ML IJ SOLN
1000.0000 ug | Freq: Once | INTRAMUSCULAR | Status: AC
  Administered 2020-04-01: 1000 ug via INTRAMUSCULAR

## 2020-04-01 MED ORDER — CYANOCOBALAMIN 1000 MCG/ML IJ SOLN
INTRAMUSCULAR | Status: AC
  Filled 2020-04-01: qty 1

## 2020-04-01 NOTE — Telephone Encounter (Signed)
Appointments scheduled calendar printed & mailed per 4/26 los

## 2020-04-01 NOTE — Progress Notes (Signed)
Hematology and Oncology Follow Up Visit  Ebony Garcia [id number] 22-May-1951 69 y.o. 04/01/2020   Principle Diagnosis:  Medication induced thrombocytopenia B 12 deficiency   Current Therapy:        Observation B 12 injection monthly   Interim History:  [name] is here today for follow-up and B 12 injection. She is doing quite well and has no complaints at this time.  B 12 level in January was stable at 329.  Platelet count today is stable 90.  She stumbled off the side walk last week and fell but thankfully was not seriously injured.  She has started eating healthy and has lost 21 lbs since we saw her in January. She is so happy about this and feels so much better.  She is staying well hydrated.  No fever, chills, n/v, cough, rash, dizziness, SOB, chest pain, palpitations, abdominal pain or changes in bowel or bladder habits.  No swelling, tenderness, numbness or tingling in her extremities.  No syncopal episodes.  No bleeding, bruising or petechiae.   ECOG Performance Status: 1 - Symptomatic but completely ambulatory  Medications:  Allergies as of 04/01/2020      Reactions   Amoxicillin-pot Clavulanate Diarrhea   Antihistamines, Diphenhydramine-type Other (See Comments)   Other Reaction: makes pt nervous   Diphenhydramine Hcl Other (See Comments)   Other Reaction: makes pt nervous   Fremanezumab-vfrm Rash, Other (See Comments)   Seizure, jerking   Clindamycin Other (See Comments)   She has not taken but per patient  "her mother had a reaction and she does not want it ever"   Hydrocodone-acetaminophen Other (See Comments)   REACTION: sensitive to  but can take for extreme pain per patient   Lisinopril    Restless , irregular sleep   Nortriptyline Other (See Comments)   UNKNOWN REACTION   Propoxyphene N-acetaminophen Nausea Only   Sulfa Antibiotics Other (See Comments)   Unknown reaction   Tramadol Other (See Comments)   UNKNOWN REACTION       Medication List       Accurate as of April 01, 2020  1:50 PM. If you have any questions, ask your nurse or doctor.        ALPRAZolam 1 MG tablet Commonly known as: XANAX Take 1 tablet (1 mg total) by mouth at bedtime as needed. for sleep   FLUoxetine 20 MG capsule Commonly known as: PROZAC TAKE 1 CAPSULE EVERY DAY (NEED OFFICE VISIT FOR MORE REFILLS)   furosemide 20 MG tablet Commonly known as: LASIX TAKE 1 TABLET EVERY DAY (NEED OFFICE VISIT FOR MORE REFILLS)   metoprolol succinate 50 MG 24 hr tablet Commonly known as: TOPROL-XL TAKE 1 TABLET ONE TIME DAILY IMMEDIATELY FOLLOWING A MEAL (NEED OFFICE VISIT FOR MORE REFILLS)   nitroGLYCERIN 0.4 MG SL tablet Commonly known as: NITROSTAT Place 1 tablet (0.4 mg total) under the tongue every 5 (five) minutes as needed for chest pain.   promethazine 25 MG tablet Commonly known as: PHENERGAN TAKE ONE TABLET BY MOUTH EVERY 6 HOURS AS NEEDED FOR NAUSEA AND VOMITING   topiramate 100 MG tablet Commonly known as: TOPAMAX Take 1 tablet (100 mg total) by mouth 2 (two) times daily.   Tosymra 10 MG/ACT Soln Generic drug: SUMAtriptan Place 1 spray into the nose as needed (May repeat after 1 hour if needed.  Maximum 2 sprays in 24 hours).   triamcinolone ointment 0.1 % Commonly known as: KENALOG Apply topically as needed   Zembrace SymTouch 3  MG/0.5ML Soaj Generic drug: SUMAtriptan Succinate Inject 3 mg into the skin 1 (one) time orthopaedic injection for up to 1 dose.   zolmitriptan 5 MG nasal solution Commonly known as: Zomig Place 1 spray into the nose as needed for migraine.       Allergies:  Allergies  Allergen Reactions  . Amoxicillin-Pot Clavulanate Diarrhea  . Antihistamines, Diphenhydramine-Type Other (See Comments)    Other Reaction: makes pt nervous  . Diphenhydramine Hcl Other (See Comments)    Other Reaction: makes pt nervous  . Fremanezumab-Vfrm Rash and Other (See Comments)    Seizure, jerking  .  Clindamycin Other (See Comments)    She has not taken but per patient  "her mother had a reaction and she does not want it ever"  . Hydrocodone-Acetaminophen Other (See Comments)    REACTION: sensitive to  but can take for extreme pain per patient  . Lisinopril     Restless , irregular sleep  . Nortriptyline Other (See Comments)    UNKNOWN REACTION  . Propoxyphene N-Acetaminophen Nausea Only  . Sulfa Antibiotics Other (See Comments)    Unknown reaction  . Tramadol Other (See Comments)    UNKNOWN REACTION    Past Medical History, Surgical history, Social history, and Family History were reviewed and updated.  Review of Systems: All other 10 point review of systems is negative.   Physical Exam:  vitals were not taken for this visit.   Wt Readings from Last 3 Encounters:  02/23/20 171 lb 12.8 oz (77.9 kg)  02/09/20 177 lb (80.3 kg)  01/01/20 183 lb (83 kg)    Ocular: Sclerae unicteric, pupils equal, round and reactive to light Ear-nose-throat: Oropharynx clear, dentition fair Lymphatic: No cervical or supraclavicular adenopathy Lungs no rales or rhonchi, good excursion bilaterally Heart regular rate and rhythm, no murmur appreciated Abd soft, nontender, positive bowel sounds, no liver or spleen tip palpated on exam, no fluid wave  MSK no focal spinal tenderness, no joint edema Neuro: non-focal, well-oriented, appropriate affect Breasts: Deferred   Lab Results  Component Value Date   WBC 6.7 04/01/2020   HGB 13.7 04/01/2020   HCT 43.2 04/01/2020   MCV 95.6 04/01/2020   PLT 90 (L) 04/01/2020   No results found for: FERRITIN, IRON, TIBC, UIBC, IRONPCTSAT Lab Results  Component Value Date   RBC 4.52 04/01/2020   No results found for: KPAFRELGTCHN, LAMBDASER, KAPLAMBRATIO No results found for: IGGSERUM, IGA, IGMSERUM No results found for: Odetta Pink, SPEI   Chemistry      Component Value Date/Time   NA 142  03/13/2020 0913   NA 143 11/11/2017 1339   K 4.0 03/13/2020 0913   K 3.6 11/11/2017 1339   CL 108 03/13/2020 0913   CL 109 (H) 11/11/2017 1339   CO2 25 03/13/2020 0913   CO2 24 11/11/2017 1339   BUN 22 03/13/2020 0913   BUN 9 11/11/2017 1339   CREATININE 1.02 03/13/2020 0913   CREATININE 1.02 (H) 02/23/2020 1444      Component Value Date/Time   CALCIUM 9.0 03/13/2020 0913   CALCIUM 9.3 11/11/2017 1339   ALKPHOS 83 01/01/2020 1328   ALKPHOS 85 (H) 11/11/2017 1339   AST 15 02/23/2020 1444   AST 17 01/01/2020 1328   ALT 13 02/23/2020 1444   ALT 16 01/01/2020 1328   ALT 24 11/11/2017 1339   BILITOT 0.4 02/23/2020 1444   BILITOT 0.4 01/01/2020 1328  Impression and Plan: [name] is a pleasant 69yo caucasian female with thrombocytopenia and B 12 deficiency.  She received her B 12 injection today and will continue her once a month schedule.  Follow-up in 3 months.  She will contact our office with any questions or concerns. We can certainly see her sooner if needed.   Laverna Peace, NP 4/26/20211:50 PM

## 2020-04-01 NOTE — Patient Instructions (Signed)

## 2020-04-02 ENCOUNTER — Encounter: Payer: Self-pay | Admitting: Family Medicine

## 2020-04-02 NOTE — Telephone Encounter (Signed)
Not sure if she is asking something or if this is an Pharmacist, hospital

## 2020-04-05 ENCOUNTER — Encounter: Payer: Self-pay | Admitting: Family Medicine

## 2020-04-05 NOTE — Telephone Encounter (Signed)
Please advise 

## 2020-04-18 ENCOUNTER — Encounter: Payer: Self-pay | Admitting: Family Medicine

## 2020-04-18 ENCOUNTER — Other Ambulatory Visit: Payer: Self-pay

## 2020-04-18 DIAGNOSIS — F419 Anxiety disorder, unspecified: Secondary | ICD-10-CM

## 2020-04-18 DIAGNOSIS — R002 Palpitations: Secondary | ICD-10-CM

## 2020-04-18 DIAGNOSIS — I10 Essential (primary) hypertension: Secondary | ICD-10-CM

## 2020-04-18 DIAGNOSIS — I5189 Other ill-defined heart diseases: Secondary | ICD-10-CM

## 2020-04-18 MED ORDER — TOPIRAMATE 100 MG PO TABS: 100.0000 mg | ORAL_TABLET | Freq: Two times a day (BID) | ORAL | 1 refills | Status: DC

## 2020-04-18 MED ORDER — METOPROLOL SUCCINATE ER 50 MG PO TB24: ORAL_TABLET | ORAL | 1 refills | Status: DC

## 2020-04-18 MED ORDER — FUROSEMIDE 20 MG PO TABS: ORAL_TABLET | ORAL | 1 refills | Status: DC

## 2020-04-18 MED ORDER — FLUOXETINE HCL 20 MG PO CAPS: 20.0000 mg | ORAL_CAPSULE | Freq: Every day | ORAL | 1 refills | Status: DC

## 2020-04-18 NOTE — Telephone Encounter (Signed)
Needs a refill Topmax sent to [facility]

## 2020-04-23 ENCOUNTER — Other Ambulatory Visit: Payer: Self-pay | Admitting: Family Medicine

## 2020-04-23 DIAGNOSIS — F419 Anxiety disorder, unspecified: Secondary | ICD-10-CM

## 2020-04-23 NOTE — Telephone Encounter (Signed)
Requesting:  Alprazolam Contract:   Signed on 11/24/2017 UDS:  Low risk on 11/24/2017 Last Visit:  02/23/2020 Next Visit:   no Last Refill:   02/23/2020  #30 with 1 refill  Please Advise

## 2020-05-01 ENCOUNTER — Other Ambulatory Visit: Payer: Self-pay

## 2020-05-01 ENCOUNTER — Inpatient Hospital Stay: Payer: Medicare HMO | Attending: Hematology & Oncology

## 2020-05-01 VITALS — BP 123/79 | HR 48 | Temp 97.5°F | Resp 17

## 2020-05-01 DIAGNOSIS — E538 Deficiency of other specified B group vitamins: Secondary | ICD-10-CM | POA: Diagnosis not present

## 2020-05-01 MED ORDER — CYANOCOBALAMIN 1000 MCG/ML IJ SOLN
1000.0000 ug | Freq: Once | INTRAMUSCULAR | Status: AC
  Administered 2020-05-01: 1000 ug via INTRAMUSCULAR

## 2020-05-01 MED ORDER — CYANOCOBALAMIN 1000 MCG/ML IJ SOLN
INTRAMUSCULAR | Status: AC
  Filled 2020-05-01: qty 1

## 2020-05-01 NOTE — Patient Instructions (Signed)

## 2020-05-09 ENCOUNTER — Ambulatory Visit: Payer: Medicare HMO | Admitting: Family Medicine

## 2020-05-14 ENCOUNTER — Other Ambulatory Visit: Payer: Self-pay

## 2020-05-14 ENCOUNTER — Encounter: Payer: Self-pay | Admitting: Family Medicine

## 2020-05-14 ENCOUNTER — Ambulatory Visit (INDEPENDENT_AMBULATORY_CARE_PROVIDER_SITE_OTHER): Payer: Medicare HMO | Admitting: Family Medicine

## 2020-05-14 VITALS — BP 130/80 | HR 52 | Temp 97.9°F | Resp 18 | Ht 62.0 in | Wt 163.2 lb

## 2020-05-14 DIAGNOSIS — F419 Anxiety disorder, unspecified: Secondary | ICD-10-CM | POA: Diagnosis not present

## 2020-05-14 DIAGNOSIS — R001 Bradycardia, unspecified: Secondary | ICD-10-CM

## 2020-05-14 DIAGNOSIS — R5383 Other fatigue: Secondary | ICD-10-CM | POA: Diagnosis not present

## 2020-05-14 DIAGNOSIS — R079 Chest pain, unspecified: Secondary | ICD-10-CM

## 2020-05-14 DIAGNOSIS — I10 Essential (primary) hypertension: Secondary | ICD-10-CM | POA: Diagnosis not present

## 2020-05-14 DIAGNOSIS — F418 Other specified anxiety disorders: Secondary | ICD-10-CM

## 2020-05-14 HISTORY — DX: Chest pain, unspecified: R07.9

## 2020-05-14 MED ORDER — LOSARTAN POTASSIUM 50 MG PO TABS: 50.0000 mg | ORAL_TABLET | Freq: Every day | ORAL | 2 refills | Status: DC

## 2020-05-14 MED ORDER — FLUOXETINE HCL 40 MG PO CAPS: 40.0000 mg | ORAL_CAPSULE | Freq: Every day | ORAL | 3 refills | Status: DC

## 2020-05-14 NOTE — Patient Instructions (Signed)

## 2020-05-14 NOTE — Progress Notes (Signed)
Patient ID: Ebony Garcia, female    DOB: 11-Mar-1951  Age: 69 y.o. MRN: [id number]    Subjective:  Subjective  HPI Ebony Garcia presents for increased migraines 5-6 x a week, anxiety and chest pains  X several weeks.  The new relationship she was in ended abruptly and she has been under a lot of stress with family things as well No sob, no palpitations chest pain does not radiate     Review of Systems  Constitutional: Negative for activity change, appetite change, chills, diaphoresis, fatigue, fever and unexpected weight change.  Eyes: Negative for pain, redness and visual disturbance.  Respiratory: Negative for cough, chest tightness, shortness of breath and wheezing.   Cardiovascular: Positive for chest pain. Negative for palpitations and leg swelling.  Gastrointestinal: Negative for abdominal distention and abdominal pain.  Endocrine: Negative for cold intolerance, heat intolerance, polydipsia, polyphagia and polyuria.  Genitourinary: Negative for difficulty urinating, dyspareunia, dysuria, flank pain, frequency, genital sores, hematuria, menstrual problem, pelvic pain, urgency, vaginal discharge and vaginal pain.  Musculoskeletal: Negative for back pain.  Neurological: Negative for dizziness, light-headedness, numbness and headaches.  Psychiatric/Behavioral: Positive for decreased concentration and sleep disturbance. Negative for agitation, behavioral problems, confusion, dysphoric mood, hallucinations, self-injury and suicidal ideas. The patient is nervous/anxious. The patient is not hyperactive.     History Past Medical History:  Diagnosis Date  . Cancer ([place])   . CHF (congestive heart failure) ([place])    dystolic heart dysfunction  . Chronic kidney disease   . Depression   . Depression with anxiety   . Diabetes mellitus without complication ([place])    Diet controlled  . Hypertension   . Intertrigo   . Migraines   . Panic attacks     She has a past surgical history  that includes Total abdominal hysterectomy w/ bilateral salpingoophorectomy; Tonsillectomy and adenoidectomy; Nasal septum surgery; Tubal ligation; Dilation and curettage of uterus; papaloma (Right); Breast surgery; and Abdominal hysterectomy.   Her family history includes Anemia in her cousin; Atrial fibrillation in her mother; Bipolar disorder in her brother and sister; Bladder Cancer in her father; Cancer in her brother, cousin, father, maternal aunt, maternal aunt, and paternal grandfather; Colon cancer in her maternal uncle; Crohn's disease in her mother; Heart attack in her maternal grandmother; Heart failure in her mother.She reports that she has never smoked. She has never used smokeless tobacco. She reports that she does not drink alcohol or use drugs.  Current Outpatient Medications on File Prior to Visit  Medication Sig Dispense Refill  . ALPRAZolam (XANAX) 1 MG tablet TAKE 1 TABLET BY MOUTH EVERY DAY AT BEDTIME AS NEEDED FOR SLEEP 30 tablet 0  . furosemide (LASIX) 20 MG tablet TAKE 1 TABLET EVERY DAY 90 tablet 1  . nitroGLYCERIN (NITROSTAT) 0.4 MG SL tablet Place 1 tablet (0.4 mg total) under the tongue every 5 (five) minutes as needed for chest pain. 25 tablet 3  . promethazine (PHENERGAN) 25 MG tablet TAKE ONE TABLET BY MOUTH EVERY 6 HOURS AS NEEDED FOR NAUSEA AND VOMITING 30 tablet 2  . topiramate (TOPAMAX) 100 MG tablet Take 1 tablet (100 mg total) by mouth 2 (two) times daily. 180 tablet 1  . triamcinolone ointment (KENALOG) 0.1 % Apply topically as needed     No current facility-administered medications on file prior to visit.     Objective:  Objective  Physical Exam Vitals and nursing note reviewed.  Constitutional:      Appearance: She is well-developed.  HENT:  Head: Normocephalic and atraumatic.  Eyes:     Conjunctiva/sclera: Conjunctivae normal.  Neck:     Thyroid: No thyromegaly.     Vascular: No carotid bruit or JVD.  Cardiovascular:     Rate and Rhythm:  Normal rate and regular rhythm.     Heart sounds: Normal heart sounds. No murmur.  Pulmonary:     Effort: Pulmonary effort is normal. No respiratory distress.     Breath sounds: Normal breath sounds. No wheezing or rales.  Chest:     Chest wall: No tenderness.  Musculoskeletal:     Cervical back: Normal range of motion and neck supple.  Neurological:     Mental Status: She is alert and oriented to person, place, and time.  Psychiatric:        Mood and Affect: Mood is anxious and depressed.    BP 130/80 (BP Location: Right Arm, Patient Position: Sitting, Cuff Size: Normal)   Pulse (!) 52   Temp 97.9 F (36.6 C) (Temporal)   Resp 18   Ht 5\' 2"  (1.575 m)   Wt 163 lb 3.2 oz (74 kg)   SpO2 97%   BMI 29.85 kg/m  Wt Readings from Last 3 Encounters:  05/14/20 163 lb 3.2 oz (74 kg)  04/01/20 162 lb (73.5 kg)  02/23/20 171 lb 12.8 oz (77.9 kg)    ekg--- sinus brady Lab Results  Component Value Date   WBC 6.7 04/01/2020   HGB 13.7 04/01/2020   HCT 43.2 04/01/2020   PLT 90 (L) 04/01/2020   GLUCOSE 146 (H) 04/01/2020   CHOL 187 02/23/2020   TRIG 147 02/23/2020   HDL 53 02/23/2020   LDLDIRECT 121.0 09/20/2017   LDLCALC 108 (H) 02/23/2020   ALT 22 04/01/2020   AST 23 04/01/2020   NA 141 04/01/2020   K 3.9 04/01/2020   CL 107 04/01/2020   CREATININE 1.06 (H) 04/01/2020   BUN 15 04/01/2020   CO2 25 04/01/2020   TSH 3.31 05/14/2020   INR 0.93 07/19/2010   HGBA1C 5.6 03/13/2020   MICROALBUR 1.1 09/20/2017    CT ANGIO HEAD W OR WO CONTRAST  Result Date: 07/06/2019 CLINICAL DATA:  New onset of headaches after age 34. New onset headache over 50, evaluate for aneurysm. Additional history: Patient "off balance" , blurred vision, dizzy EXAM: CT ANGIOGRAPHY HEAD TECHNIQUE: Multidetector CT imaging of the head was performed using the standard protocol during bolus administration of intravenous contrast. Multiplanar CT image reconstructions and MIPs were obtained to evaluate the  vascular anatomy. CONTRAST:  45mL ISOVUE-370 IOPAMIDOL (ISOVUE-370) INJECTION 76% COMPARISON:  Brain MRI 08/27/2015 FINDINGS: CT HEAD Brain: There is no acute intracranial hemorrhage or demarcated territorial infarction.No evidence of intracranial mass.No midline shift or extra-axial collection. Cerebral volume is age appropriate. Vascular: Reported separately Skull: Normal. Negative for fracture or focal lesion. Sinuses: No significant paranasal sinus disease or mastoid effusion. Orbits: The imaged globes and orbits are unremarkable. CTA HEAD Anterior circulation: No large vessel occlusion or proximal high-grade arterial stenosis.No intracranial aneurysm is identified. Posterior circulation: No large vessel occlusion or proximal high-grade arterial stenosis.No intracranial aneurysm is identified. Venous sinuses: Within the limitations of contrast timing, no evidence of thrombosis. Anatomic variants: Diminutive intracranial left vertebral artery beyond the left PICA origin. Predominantly fetal origin of the bilateral posterior cerebral arteries. IMPRESSION: CT HEAD: Unremarkable non-contrast CT appearance of the brain. No evidence of intracranial mass or acute intracranial abnormality. CTA HEAD: No large vessel occlusion or proximal high-grade arterial stenosis. No  intracranial aneurysm is identified. Electronically Signed   By: Kellie Simmering   On: 07/06/2019 10:33     Assessment & Plan:  Plan  I have discontinued [place] T. Oien's FLUoxetine and metoprolol succinate. I am also having her start on losartan and FLUoxetine. Additionally, I am having her maintain her nitroGLYCERIN, triamcinolone ointment, promethazine, topiramate, furosemide, and ALPRAZolam.  Meds ordered this encounter  Medications  . losartan (COZAAR) 50 MG tablet    Sig: Take 1 tablet (50 mg total) by mouth daily.    Dispense:  30 tablet    Refill:  2  . FLUoxetine (PROZAC) 40 MG capsule    Sig: Take 1 capsule (40 mg total) by mouth  daily.    Dispense:  90 capsule    Refill:  3    Problem List Items Addressed This Visit      Unprioritized   Anxiety   Relevant Medications   FLUoxetine (PROZAC) 40 MG capsule   Other Relevant Orders   TSH (Completed)   Bradycardia    Stop metoprolol      Relevant Orders   TSH (Completed)   Chest pain    ekg--  Bradycardia Stop metoprolol  Start losartan      Relevant Orders   EKG 12-Lead (Completed)   Depression with anxiety    Worsening-- inc prozac to 40 mg and f/u 4-6 weeks  Gave her name / number for counselors       Relevant Medications   FLUoxetine (PROZAC) 40 MG capsule   Essential hypertension    Change metoprolol to losartan  F/u 4-6 weeks or sooner prn       Relevant Medications   losartan (COZAAR) 50 MG tablet   Fatigue - Primary   Relevant Orders   TSH (Completed)      Follow-up: Return in about 4 weeks (around 06/11/2020), or if symptoms worsen or fail to improve, for anxiety, bp.  [name], DO

## 2020-05-15 LAB — TSH: TSH: 3.31 u[IU]/mL (ref 0.35–4.50)

## 2020-05-15 NOTE — Assessment & Plan Note (Addendum)
Worsening-- inc prozac to 40 mg and f/u 4-6 weeks  Gave her name / number for counselors

## 2020-05-15 NOTE — Assessment & Plan Note (Addendum)
ekg--  Bradycardia Stop metoprolol  Start losartan

## 2020-05-15 NOTE — Assessment & Plan Note (Signed)
Stop metoprolol

## 2020-05-15 NOTE — Assessment & Plan Note (Signed)
Change metoprolol to losartan  F/u 4-6 weeks or sooner prn

## 2020-05-16 ENCOUNTER — Encounter: Payer: Self-pay | Admitting: Family Medicine

## 2020-05-28 ENCOUNTER — Other Ambulatory Visit: Payer: Self-pay | Admitting: Family Medicine

## 2020-05-28 DIAGNOSIS — F419 Anxiety disorder, unspecified: Secondary | ICD-10-CM

## 2020-05-28 NOTE — Telephone Encounter (Signed)
.  Requesting: Xanax Contract: 11/24/2017 UDS: 11/24/2017 Last OV: 05/14/2020 Next OV: 06/11/2020 Last Refill: 04/23/2020, #30--0 RF Database:   Please advise

## 2020-05-28 NOTE — Telephone Encounter (Signed)
Need uds and contract next visit

## 2020-05-31 ENCOUNTER — Inpatient Hospital Stay: Payer: Medicare HMO | Attending: Hematology & Oncology

## 2020-05-31 ENCOUNTER — Other Ambulatory Visit: Payer: Self-pay

## 2020-05-31 VITALS — BP 133/74 | HR 79 | Temp 97.3°F | Resp 17

## 2020-05-31 DIAGNOSIS — E538 Deficiency of other specified B group vitamins: Secondary | ICD-10-CM

## 2020-05-31 MED ORDER — CYANOCOBALAMIN 1000 MCG/ML IJ SOLN
1000.0000 ug | Freq: Once | INTRAMUSCULAR | Status: AC
  Administered 2020-05-31: 1000 ug via INTRAMUSCULAR

## 2020-05-31 MED ORDER — CYANOCOBALAMIN 1000 MCG/ML IJ SOLN
INTRAMUSCULAR | Status: AC
  Filled 2020-05-31: qty 1

## 2020-05-31 NOTE — Patient Instructions (Signed)

## 2020-06-11 ENCOUNTER — Ambulatory Visit: Payer: Medicare HMO | Admitting: Family Medicine

## 2020-06-18 DIAGNOSIS — L570 Actinic keratosis: Secondary | ICD-10-CM | POA: Diagnosis not present

## 2020-06-18 DIAGNOSIS — L304 Erythema intertrigo: Secondary | ICD-10-CM | POA: Diagnosis not present

## 2020-06-25 ENCOUNTER — Other Ambulatory Visit: Payer: Self-pay | Admitting: Family Medicine

## 2020-06-25 DIAGNOSIS — F419 Anxiety disorder, unspecified: Secondary | ICD-10-CM

## 2020-06-25 NOTE — Telephone Encounter (Signed)
Last refill- 05-28-2020 Last O.V- 05-14-2020

## 2020-06-26 ENCOUNTER — Encounter: Payer: Self-pay | Admitting: Family Medicine

## 2020-06-26 NOTE — Telephone Encounter (Signed)
Okay to change Rx or take half of a tablet? Please advise

## 2020-06-26 NOTE — Telephone Encounter (Signed)
Ok to change dose.   She can take 1/2 tab of what she has left

## 2020-06-27 ENCOUNTER — Encounter: Payer: Self-pay | Admitting: Family Medicine

## 2020-06-28 MED ORDER — FLUOXETINE HCL 20 MG PO CAPS: 20.0000 mg | ORAL_CAPSULE | Freq: Every day | ORAL | 3 refills | Status: DC

## 2020-06-28 NOTE — Telephone Encounter (Signed)
Ok to send in new dose

## 2020-07-01 ENCOUNTER — Inpatient Hospital Stay: Payer: Medicare HMO | Attending: Hematology & Oncology

## 2020-07-01 ENCOUNTER — Other Ambulatory Visit: Payer: Self-pay

## 2020-07-01 ENCOUNTER — Inpatient Hospital Stay: Payer: Medicare HMO

## 2020-07-01 ENCOUNTER — Inpatient Hospital Stay (HOSPITAL_BASED_OUTPATIENT_CLINIC_OR_DEPARTMENT_OTHER): Payer: Medicare HMO | Admitting: Family

## 2020-07-01 ENCOUNTER — Encounter: Payer: Self-pay | Admitting: Family

## 2020-07-01 VITALS — BP 106/77 | HR 90 | Temp 97.6°F | Resp 19 | Ht 63.0 in | Wt 164.4 lb

## 2020-07-01 DIAGNOSIS — E538 Deficiency of other specified B group vitamins: Secondary | ICD-10-CM

## 2020-07-01 DIAGNOSIS — D696 Thrombocytopenia, unspecified: Secondary | ICD-10-CM | POA: Diagnosis not present

## 2020-07-01 DIAGNOSIS — Z79899 Other long term (current) drug therapy: Secondary | ICD-10-CM | POA: Diagnosis not present

## 2020-07-01 DIAGNOSIS — D6959 Other secondary thrombocytopenia: Secondary | ICD-10-CM | POA: Insufficient documentation

## 2020-07-01 LAB — CMP (CANCER CENTER ONLY)
ALT: 16 U/L (ref 0–44)
AST: 16 U/L (ref 15–41)
Albumin: 4.8 g/dL (ref 3.5–5.0)
Alkaline Phosphatase: 69 U/L (ref 38–126)
Anion gap: 8 (ref 5–15)
BUN: 16 mg/dL (ref 8–23)
CO2: 27 mmol/L (ref 22–32)
Calcium: 10 mg/dL (ref 8.9–10.3)
Chloride: 107 mmol/L (ref 98–111)
Creatinine: 1.08 mg/dL — ABNORMAL HIGH (ref 0.44–1.00)
GFR, Est AFR Am: >60 mL/min (ref >60)
GFR, Estimated: 53 mL/min — ABNORMAL LOW (ref >60)
Glucose, Bld: 114 mg/dL — ABNORMAL HIGH (ref 70–99)
Potassium: 4.2 mmol/L (ref 3.5–5.1)
Sodium: 142 mmol/L (ref 135–145)
Total Bilirubin: 0.4 mg/dL (ref 0.3–1.2)
Total Protein: 7.3 g/dL (ref 6.5–8.1)

## 2020-07-01 LAB — CBC WITH DIFFERENTIAL (CANCER CENTER ONLY)
Abs Immature Granulocytes: 0.01 10*3/uL (ref 0.00–0.07)
Basophils Absolute: 0.1 10*3/uL (ref 0.0–0.1)
Basophils Relative: 1 %
Eosinophils Absolute: 0.1 10*3/uL (ref 0.0–0.5)
Eosinophils Relative: 2 %
HCT: 43.3 % (ref 36.0–46.0)
Hemoglobin: 13.8 g/dL (ref 12.0–15.0)
Immature Granulocytes: 0 %
Lymphocytes Relative: 24 %
Lymphs Abs: 1.4 10*3/uL (ref 0.7–4.0)
MCH: 30.6 pg (ref 26.0–34.0)
MCHC: 31.9 g/dL (ref 30.0–36.0)
MCV: 96 fL (ref 80.0–100.0)
Monocytes Absolute: 0.4 10*3/uL (ref 0.1–1.0)
Monocytes Relative: 7 %
Neutro Abs: 3.9 10*3/uL (ref 1.7–7.7)
Neutrophils Relative %: 66 %
Platelet Count: 60 10*3/uL — ABNORMAL LOW (ref 150–400)
RBC: 4.51 MIL/uL (ref 3.87–5.11)
RDW: 13.2 % (ref 11.5–15.5)
WBC Count: 5.9 10*3/uL (ref 4.0–10.5)
nRBC: 0 % (ref 0.0–0.2)

## 2020-07-01 LAB — VITAMIN B12: Vitamin B-12: 394 pg/mL (ref 180–914)

## 2020-07-01 LAB — PLATELET BY CITRATE

## 2020-07-01 MED ORDER — CYANOCOBALAMIN 1000 MCG/ML IJ SOLN
INTRAMUSCULAR | Status: AC
  Filled 2020-07-01: qty 2

## 2020-07-01 MED ORDER — CYANOCOBALAMIN 1000 MCG/ML IJ SOLN
1000.0000 ug | Freq: Once | INTRAMUSCULAR | Status: AC
  Administered 2020-07-01: 1000 ug via INTRAMUSCULAR

## 2020-07-01 NOTE — Patient Instructions (Signed)

## 2020-07-01 NOTE — Progress Notes (Signed)
Hematology and Oncology Follow Up Visit  Ebony Garcia [id number] December 21, 1950 69 y.o. 07/01/2020   Principle Diagnosis:  Medication induced thrombocytopenia B 12 deficiency  Current Therapy: Observation B 12 injection monthly   Interim History:  Ebony Garcia is here today for follow-up and B 12. She is still feeling fatigued. She states that she had to call her PCP and have her Prozac dosage lowered. She had also noted a 5 lb weight gain.  She has not noted any bleeding. Platelet count today is 60. Hgb 13.8 and WBC count 5.9.  No fever, chills, n/v, cough, rash, dizziness, SOB, chest pain, palpitations, abdominal pain or changes in bowel or bladder habits.  No swelling, tenderness, numbness or tingling in her extremities at this time.  No falls or syncopal episodes to report.  She has maintained a good appetite and is staying well hydrated.  ECOG Performance Status: 1 - Symptomatic but completely ambulatory  Medications:  Allergies as of 07/01/2020      Reactions   Amoxicillin-pot Clavulanate Diarrhea   Antihistamines, Diphenhydramine-type Other (See Comments)   Other Reaction: makes pt nervous   Diphenhydramine Hcl Other (See Comments)   Other Reaction: makes pt nervous   Fremanezumab-vfrm Rash, Other (See Comments)   Seizure, jerking   Clindamycin Other (See Comments)   She has not taken but per patient  "her mother had a reaction and she does not want it ever"   Hydrocodone-acetaminophen Other (See Comments)   REACTION: sensitive to  but can take for extreme pain per patient   Lisinopril    Restless , irregular sleep   Nortriptyline Other (See Comments)   UNKNOWN REACTION   Propoxyphene N-acetaminophen Nausea Only   Sulfa Antibiotics Other (See Comments)   Unknown reaction   Tramadol Other (See Comments)   UNKNOWN REACTION      Medication List       Accurate as of July 01, 2020  1:59 PM. If you have any questions, ask your nurse or doctor.         ALPRAZolam 1 MG tablet Commonly known as: XANAX TAKE 1 TABLET BY MOUTH EVERY DAY AT BEDTIME AS NEEDED FOR SLEEP   FLUoxetine 20 MG capsule Commonly known as: PROZAC Take 1 capsule (20 mg total) by mouth daily.   furosemide 20 MG tablet Commonly known as: LASIX TAKE 1 TABLET EVERY DAY   losartan 50 MG tablet Commonly known as: COZAAR Take 1 tablet (50 mg total) by mouth daily.   nitroGLYCERIN 0.4 MG SL tablet Commonly known as: NITROSTAT Place 1 tablet (0.4 mg total) under the tongue every 5 (five) minutes as needed for chest pain.   promethazine 25 MG tablet Commonly known as: PHENERGAN TAKE ONE TABLET BY MOUTH EVERY 6 HOURS AS NEEDED FOR NAUSEA AND VOMITING   topiramate 100 MG tablet Commonly known as: TOPAMAX Take 1 tablet (100 mg total) by mouth 2 (two) times daily.   triamcinolone ointment 0.1 % Commonly known as: KENALOG Apply topically as needed       Allergies:  Allergies  Allergen Reactions  . Amoxicillin-Pot Clavulanate Diarrhea  . Antihistamines, Diphenhydramine-Type Other (See Comments)    Other Reaction: makes pt nervous  . Diphenhydramine Hcl Other (See Comments)    Other Reaction: makes pt nervous  . Fremanezumab-Vfrm Rash and Other (See Comments)    Seizure, jerking  . Clindamycin Other (See Comments)    She has not taken but per patient  "her mother had a reaction and she does  not want it ever"  . Hydrocodone-Acetaminophen Other (See Comments)    REACTION: sensitive to  but can take for extreme pain per patient  . Lisinopril     Restless , irregular sleep  . Nortriptyline Other (See Comments)    UNKNOWN REACTION  . Propoxyphene N-Acetaminophen Nausea Only  . Sulfa Antibiotics Other (See Comments)    Unknown reaction  . Tramadol Other (See Comments)    UNKNOWN REACTION    Past Medical History, Surgical history, Social history, and Family History were reviewed and updated.  Review of Systems: All other 10 point review of systems is  negative.   Physical Exam:  vitals were not taken for this visit.   Wt Readings from Last 3 Encounters:  05/14/20 163 lb 3.2 oz (74 kg)  04/01/20 162 lb (73.5 kg)  02/23/20 171 lb 12.8 oz (77.9 kg)    Ocular: Sclerae unicteric, pupils equal, round and reactive to light Ear-nose-throat: Oropharynx clear, dentition fair Lymphatic: No cervical or supraclavicular adenopathy Lungs no rales or rhonchi, good excursion bilaterally Heart regular rate and rhythm, no murmur appreciated Abd soft, nontender, positive bowel sounds, no liver or spleen tip palpated on exam, no fluid wave  MSK no focal spinal tenderness, no joint edema Neuro: non-focal, well-oriented, appropriate affect Breasts: Deferred   Lab Results  Component Value Date   WBC 5.9 07/01/2020   HGB 13.8 07/01/2020   HCT 43.3 07/01/2020   MCV 96.0 07/01/2020   PLT 60 (L) 07/01/2020   No results found for: FERRITIN, IRON, TIBC, UIBC, IRONPCTSAT Lab Results  Component Value Date   RBC 4.51 07/01/2020   No results found for: KPAFRELGTCHN, LAMBDASER, KAPLAMBRATIO No results found for: IGGSERUM, IGA, IGMSERUM No results found for: Odetta Pink, SPEI   Chemistry      Component Value Date/Time   NA 141 04/01/2020 1332   NA 143 11/11/2017 1339   K 3.9 04/01/2020 1332   K 3.6 11/11/2017 1339   CL 107 04/01/2020 1332   CL 109 (H) 11/11/2017 1339   CO2 25 04/01/2020 1332   CO2 24 11/11/2017 1339   BUN 15 04/01/2020 1332   BUN 9 11/11/2017 1339   CREATININE 1.06 (H) 04/01/2020 1332   CREATININE 1.02 (H) 02/23/2020 1444      Component Value Date/Time   CALCIUM 9.8 04/01/2020 1332   CALCIUM 9.3 11/11/2017 1339   ALKPHOS 68 04/01/2020 1332   ALKPHOS 85 (H) 11/11/2017 1339   AST 23 04/01/2020 1332   ALT 22 04/01/2020 1332   ALT 24 11/11/2017 1339   BILITOT 0.3 04/01/2020 1332       Impression and Plan: Ebony Garcia is a pleasant 69yo caucasian female with  thrombocytopenia and B12 deficiency. She did received her B12 injection today and will continue her once a month injection schedule.  Follow-up in 3 months.  She can contact our office with any questions or concerns.   Laverna Peace, NP 7/26/20211:59 PM

## 2020-07-02 ENCOUNTER — Telehealth: Payer: Self-pay | Admitting: Family

## 2020-07-02 NOTE — Telephone Encounter (Signed)
LMVM and updated calendar was mailed per 7/26 los

## 2020-07-03 ENCOUNTER — Telehealth: Payer: Self-pay | Admitting: Family Medicine

## 2020-07-04 ENCOUNTER — Ambulatory Visit: Payer: Medicare HMO | Admitting: Family Medicine

## 2020-07-16 ENCOUNTER — Other Ambulatory Visit: Payer: Self-pay

## 2020-07-16 ENCOUNTER — Encounter: Payer: Self-pay | Admitting: Family Medicine

## 2020-07-16 ENCOUNTER — Telehealth (INDEPENDENT_AMBULATORY_CARE_PROVIDER_SITE_OTHER): Payer: Medicare HMO | Admitting: Family Medicine

## 2020-07-16 DIAGNOSIS — I10 Essential (primary) hypertension: Secondary | ICD-10-CM

## 2020-07-16 DIAGNOSIS — E785 Hyperlipidemia, unspecified: Secondary | ICD-10-CM

## 2020-07-16 DIAGNOSIS — F418 Other specified anxiety disorders: Secondary | ICD-10-CM

## 2020-07-16 NOTE — Progress Notes (Signed)
Virtual Visit via Video Note  I connected with Ebony Garcia on 07/16/20 at  2:00 PM EDT by a video enabled telemedicine application and verified that I am speaking with the correct person using two identifiers.  Location: Patient: home  Provider: office    I discussed the limitations of evaluation and management by telemedicine and the availability of in person appointments. The patient expressed understanding and agreed to proceed.  History of Present Illness: Pt is home f/u on anxiety and bp.  No complaints  She is doing well    Observations/Objective: Vitals:   07/16/20 1405  BP: 111/73  Pulse: 65    Pt is in nad,  No sob Pt is not suicidal--- no more headaches  Assessment and Plan: 1. Essential hypertension Well controlled, no changes to meds. Encouraged heart healthy diet such as the DASH diet and exercise as tolerated.    2. Depression with anxiety Stable Don't meds --- d/w pt trying to wean off or at least dec dose of xanax   3. Hyperlipidemia, unspecified hyperlipidemia type Encouraged heart healthy diet, increase exercise, avoid trans fats, consider a krill oil cap daily   Follow Up Instructions:    I discussed the assessment and treatment plan with the patient. The patient was provided an opportunity to ask questions and all were answered. The patient agreed with the plan and demonstrated an understanding of the instructions.   The patient was advised to call back or seek an in-person evaluation if the symptoms worsen or if the condition fails to improve as anticipated.  I provided 25 minutes of non-face-to-face time during this encounter.   [name], DO

## 2020-07-16 NOTE — Assessment & Plan Note (Signed)
Well controlled, no changes to meds. Encouraged heart healthy diet such as the DASH diet and exercise as tolerated.  °

## 2020-07-16 NOTE — Assessment & Plan Note (Signed)
Stable con't prozac D/w pt trying to wean off or at least decrease dose of xanax

## 2020-07-16 NOTE — Assessment & Plan Note (Signed)
Encouraged heart healthy diet, increase exercise, avoid trans fats, consider a krill oil cap daily 

## 2020-07-28 ENCOUNTER — Other Ambulatory Visit: Payer: Self-pay | Admitting: Family Medicine

## 2020-07-28 DIAGNOSIS — F419 Anxiety disorder, unspecified: Secondary | ICD-10-CM

## 2020-07-29 NOTE — Telephone Encounter (Signed)
Alprazolam refill.   Last OV: 07/16/2020 Last Fill: 06/25/2020 #30 and 0RF Pt sig: 1 tab qhs prn UDS: 11/24/2017 Low risk

## 2020-08-01 ENCOUNTER — Inpatient Hospital Stay: Payer: Medicare HMO | Attending: Hematology & Oncology

## 2020-08-01 ENCOUNTER — Other Ambulatory Visit: Payer: Self-pay

## 2020-08-01 VITALS — BP 133/73 | HR 85 | Temp 98.9°F | Resp 16

## 2020-08-01 DIAGNOSIS — E538 Deficiency of other specified B group vitamins: Secondary | ICD-10-CM | POA: Diagnosis not present

## 2020-08-01 MED ORDER — CYANOCOBALAMIN 1000 MCG/ML IJ SOLN
1000.0000 ug | Freq: Once | INTRAMUSCULAR | Status: AC
  Administered 2020-08-01: 1000 ug via INTRAMUSCULAR

## 2020-08-01 MED ORDER — CYANOCOBALAMIN 1000 MCG/ML IJ SOLN
INTRAMUSCULAR | Status: AC
  Filled 2020-08-01: qty 1

## 2020-08-01 NOTE — Patient Instructions (Signed)

## 2020-08-08 ENCOUNTER — Other Ambulatory Visit: Payer: Self-pay | Admitting: Family Medicine

## 2020-08-08 DIAGNOSIS — I10 Essential (primary) hypertension: Secondary | ICD-10-CM

## 2020-08-09 ENCOUNTER — Encounter: Payer: Self-pay | Admitting: Family Medicine

## 2020-08-14 ENCOUNTER — Encounter: Payer: Self-pay | Admitting: Family Medicine

## 2020-08-14 DIAGNOSIS — I5189 Other ill-defined heart diseases: Secondary | ICD-10-CM

## 2020-08-14 DIAGNOSIS — I10 Essential (primary) hypertension: Secondary | ICD-10-CM

## 2020-08-15 MED ORDER — FUROSEMIDE 20 MG PO TABS: ORAL_TABLET | ORAL | 1 refills | Status: DC

## 2020-08-15 MED ORDER — LOSARTAN POTASSIUM 50 MG PO TABS: 50.0000 mg | ORAL_TABLET | Freq: Every day | ORAL | 1 refills | Status: DC

## 2020-08-15 MED ORDER — FLUOXETINE HCL 20 MG PO CAPS: 20.0000 mg | ORAL_CAPSULE | Freq: Every day | ORAL | 1 refills | Status: DC

## 2020-08-15 NOTE — Progress Notes (Signed)
NEUROLOGY FOLLOW UP OFFICE NOTE  Ebony Garcia [id number]  HISTORY OF PRESENT ILLNESS: Ebony Garcia a 69 year old right-handed woman with hypertension, CHF, depression and hyperlipidemia whofollows up for migraines.  UPDATE She wakes up with a headache every morning but it resolves after her cup caffeine.   Current NSAIDS:no Current analgesics: no. Current triptans:Zomig 5mg  NS Current anti-emetic: Phenergan25mg  Current muscle relaxants: no Current anti-anxiolytic: Xanax 1mg  Current sleep aide: Xanax 1mg  Current Antihypertensive medications: losartan, Lasix Current Antidepressant medications: Prozac 20mg  Current Anticonvulsant medications: topiramate 100mg  BID Current anti-CGRP: none  Depression and anxiety: Yes She reports off and on body aches, paresthesias, head pains. Sleep:  She never stays asleep except for the past week.   HISTORY: Onset: Since her late 106s. Location: usually left sided, periorbital, and back of head.More recently, holocephalic Quality:Sharp, aching pressure.Non-throbbing Initial intensity:7/10; Sept: 7/10 Associated symptoms: Nausea,photophobia and phonophobia.No visual disturbance, osmophobia,vomitingor unilateral numbness or weakness Aura:no Initial duration:3 days; Sept: 30 minutes with Maxalt Initial frequency:25 headache days per month Triggers: Emotional stress Relieving factors: Cold mask, heating pack on neck and shoulders  Past NSAIDs: naproxen (ineffective), indomethacin, toradol tablet (ineffective), Toradol 60mg  IM (effective), Cambia (side effects) Past analgesics: Tylenol (ineffective), Excedrin (ineffective), Lidocaine nasal drops (effective but caused hives), tramadol (side effects), Midrin (effective but made her sleepy) Past triptans/ergots: sumatriptan 100mg , Zomig 5mg  (effective), Frova, Relpax, DHE (effective), Maxalt (effective but unable to afford it),Tosymra NS Past  antiemetic: Reglan Past anxiolytics: Vicodin Past muscle relaxants: cyclobenzaprine Past antihypertensives: propranolol (ineffective), atenolol (ineffective), lisinopril, metoprolol, HCTZ Past antidepressants: amitriptyline (side effects), venlafaxine (ineffective), nortriptyline (effective but side effects such as rash), imipramine Past antiepileptics: Depakote (hair loss), lamotrigine (side effects), zonisamide (side effects), gabapentin, Keppra Past CGRP inhibitor: Aimovig (effective but concerned about side effects- ankle jerking, abnormal skin pattern), Ajovy (same side effects); Emgality (cost) Past vitamins and supplements: magnesium (ineffective), feverfew (ineffective) Other past therapy: Botox (one round, flu like symptoms, unable to afford 20% copay), occipital nerve blocks, trigger point injections, alternating heat/ice  Family history of headache:Mother, grandmother, great-aunt, cousins.  08/16/08 MRI Brain w/wo performed for "explosive headaches": nonspecific punctate hyperintensities in the subcortical white matter. 08/16/08 MRA Head: Unremarkable.There is mild attenuation in the vertebrobasilar junction and proximal basilar artery which is likely artifact. 07/05/19 CTA of Head:  Unremarkable for mass lesion, high-grade arterial stenosis, aneurysm or other acute intracranial abnormality.  PAST MEDICAL HISTORY: Past Medical History:  Diagnosis Date  . Cancer ([place])   . CHF (congestive heart failure) ([place])    dystolic heart dysfunction  . Chronic kidney disease   . Depression   . Depression with anxiety   . Diabetes mellitus without complication ([place])    Diet controlled  . Hypertension   . Intertrigo   . Migraines   . Panic attacks     MEDICATIONS: Current Outpatient Medications on File Prior to Visit  Medication Sig Dispense Refill  . ALPRAZolam (XANAX) 1 MG tablet TAKE 1 TABLET BY MOUTH EVERY DAY AT BEDTIME AS NEEDED FOR SLEEP 30 tablet 0  . FLUoxetine  (PROZAC) 20 MG capsule Take 1 capsule (20 mg total) by mouth daily. 90 capsule 1  . furosemide (LASIX) 20 MG tablet TAKE 1 TABLET EVERY DAY 90 tablet 1  . losartan (COZAAR) 50 MG tablet Take 1 tablet (50 mg total) by mouth daily. 90 tablet 1  . nitroGLYCERIN (NITROSTAT) 0.4 MG SL tablet Place 1 tablet (0.4 mg total) under the tongue every 5 (five) minutes as needed for chest pain. 25  tablet 3  . promethazine (PHENERGAN) 25 MG tablet TAKE ONE TABLET BY MOUTH EVERY 6 HOURS AS NEEDED FOR NAUSEA AND VOMITING 30 tablet 2  . topiramate (TOPAMAX) 100 MG tablet Take 1 tablet (100 mg total) by mouth 2 (two) times daily. 180 tablet 1  . triamcinolone ointment (KENALOG) 0.1 % Apply topically as needed     No current facility-administered medications on file prior to visit.    ALLERGIES: Allergies  Allergen Reactions  . Amoxicillin-Pot Clavulanate Diarrhea  . Antihistamines, Diphenhydramine-Type Other (See Comments)    Other Reaction: makes pt nervous  . Diphenhydramine Hcl Other (See Comments)    Other Reaction: makes pt nervous  . Fremanezumab-Vfrm Rash and Other (See Comments)    Seizure, jerking  . Clindamycin Other (See Comments)    She has not taken but per patient  "her mother had a reaction and she does not want it ever"  . Hydrocodone-Acetaminophen Other (See Comments)    REACTION: sensitive to  but can take for extreme pain per patient  . Lisinopril     Restless , irregular sleep  . Nortriptyline Other (See Comments)    UNKNOWN REACTION  . Propoxyphene N-Acetaminophen Nausea Only  . Sulfa Antibiotics Other (See Comments)    Unknown reaction  . Tramadol Other (See Comments)    UNKNOWN REACTION    FAMILY HISTORY: Family History  Problem Relation Age of Onset  . Crohn's disease Mother   . Atrial fibrillation Mother   . Heart failure Mother   . Bladder Cancer Father   . Cancer Father        bladder  . Bipolar disorder Brother   . Cancer Brother   . Bipolar disorder Sister     . Heart attack Maternal Grandmother        >65  . Cancer Maternal Aunt   . Colon cancer Maternal Uncle   . Cancer Paternal Grandfather   . Cancer Maternal Aunt   . Cancer Cousin   . Anemia Cousin   . Diabetes Neg Hx   . Stroke Neg Hx     SOCIAL HISTORY: Social History   Socioeconomic History  . Marital status: Divorced    Spouse name: Not on file  . Number of children: 3  . Years of education: Not on file  . Highest education level: Not on file  Occupational History    Employer: UNEMPLOYED  Tobacco Use  . Smoking status: Never Smoker  . Smokeless tobacco: Never Used  Vaping Use  . Vaping Use: Never used  Substance and Sexual Activity  . Alcohol use: No    Alcohol/week: 0.0 standard drinks  . Drug use: No  . Sexual activity: Not Currently    Partners: Male  Other Topics Concern  . Not on file  Social History Narrative   Lives alone.        Patient is right-handed. She lives on the first floor.      Social Determinants of Health   Financial Resource Strain:   . Difficulty of Paying Living Expenses: Not on file  Food Insecurity:   . Worried About Charity fundraiser in the Last Year: Not on file  . Ran Out of Food in the Last Year: Not on file  Transportation Needs:   . Lack of Transportation (Medical): Not on file  . Lack of Transportation (Non-Medical): Not on file  Physical Activity:   . Days of Exercise per Week: Not on file  . Minutes of Exercise  per Session: Not on file  Stress:   . Feeling of Stress : Not on file  Social Connections:   . Frequency of Communication with Friends and Family: Not on file  . Frequency of Social Gatherings with Friends and Family: Not on file  . Attends Religious Services: Not on file  . Active Member of Clubs or Organizations: Not on file  . Attends Archivist Meetings: Not on file  . Marital Status: Not on file  Intimate Partner Violence:   . Fear of Current or Ex-Partner: Not on file  . Emotionally  Abused: Not on file  . Physically Abused: Not on file  . Sexually Abused: Not on file    PHYSICAL EXAM: Blood pressure 109/71, pulse 74, height 5\' 2"  (1.575 m), weight 166 lb 12.8 oz (75.7 kg), SpO2 99 %. General: No acute distress.  Patient appears well-groomed.   Head:  Normocephalic/atraumatic Eyes:  Fundi examined but not visualized Neck: supple, no paraspinal tenderness, full range of motion Heart:  Regular rate and rhythm Lungs:  Clear to auscultation bilaterally Back: No paraspinal tenderness Neurological Exam: alert and oriented to person, place, and time. Attention span and concentration intact, recent and remote memory intact, fund of knowledge intact.  Speech fluent and not dysarthric, language intact.  CN II-XII intact. Bulk and tone normal, muscle strength 5/5 throughout.  Sensation to light touch, temperature and vibration intact.  Deep tendon reflexes 2+ throughout, toes downgoing.  Finger to nose and heel to shin testing intact.  Gait normal, Romberg negative.  IMPRESSION: 1.  Migraine without aura, without status migrainosus, not intractable 2.  Chronic daily morning headaches.  May be rebound secondary to caffeine use.  Consider OSA 3.  Excessive daytime sleepiness  PLAN: 1. Will order sleep study. 2. For preventative management, topiramate 100mg  twice daily 3.  Limit use of pain relievers to no more than 2 days out of week to prevent risk of rebound or medication-overuse headache. 4.  Keep headache diary 5.  Follow up 6 months.   Metta Clines, DO  CC:  Roma Schanz, DO

## 2020-08-19 ENCOUNTER — Ambulatory Visit (INDEPENDENT_AMBULATORY_CARE_PROVIDER_SITE_OTHER): Payer: Medicare HMO | Admitting: Neurology

## 2020-08-19 ENCOUNTER — Encounter: Payer: Self-pay | Admitting: Neurology

## 2020-08-19 ENCOUNTER — Other Ambulatory Visit: Payer: Self-pay

## 2020-08-19 VITALS — BP 109/71 | HR 74 | Ht 62.0 in | Wt 166.8 lb

## 2020-08-19 DIAGNOSIS — R519 Headache, unspecified: Secondary | ICD-10-CM | POA: Diagnosis not present

## 2020-08-19 DIAGNOSIS — G4719 Other hypersomnia: Secondary | ICD-10-CM

## 2020-08-19 DIAGNOSIS — G43709 Chronic migraine without aura, not intractable, without status migrainosus: Secondary | ICD-10-CM | POA: Diagnosis not present

## 2020-08-19 NOTE — Patient Instructions (Addendum)
1.  Will order a sleep study. MS 2.  Continue topiramate 100mg  twice daily

## 2020-08-23 DIAGNOSIS — K611 Rectal abscess: Secondary | ICD-10-CM | POA: Diagnosis not present

## 2020-08-23 DIAGNOSIS — R2231 Localized swelling, mass and lump, right upper limb: Secondary | ICD-10-CM | POA: Diagnosis not present

## 2020-09-02 ENCOUNTER — Inpatient Hospital Stay: Payer: Medicare HMO | Attending: Hematology & Oncology

## 2020-09-02 ENCOUNTER — Other Ambulatory Visit: Payer: Self-pay

## 2020-09-02 VITALS — BP 109/70 | HR 68 | Temp 98.4°F | Resp 17

## 2020-09-02 DIAGNOSIS — E538 Deficiency of other specified B group vitamins: Secondary | ICD-10-CM | POA: Diagnosis not present

## 2020-09-02 MED ORDER — CYANOCOBALAMIN 1000 MCG/ML IJ SOLN
1000.0000 ug | Freq: Once | INTRAMUSCULAR | Status: AC
  Administered 2020-09-02: 1000 ug via INTRAMUSCULAR

## 2020-09-02 MED ORDER — CYANOCOBALAMIN 1000 MCG/ML IJ SOLN
INTRAMUSCULAR | Status: AC
  Filled 2020-09-02: qty 1

## 2020-09-02 NOTE — Patient Instructions (Signed)

## 2020-09-09 ENCOUNTER — Other Ambulatory Visit: Payer: Self-pay | Admitting: Family Medicine

## 2020-09-09 DIAGNOSIS — F419 Anxiety disorder, unspecified: Secondary | ICD-10-CM

## 2020-09-09 NOTE — Telephone Encounter (Signed)
Requesting: alprazolam 1mg  Contract: 11/24/2017 UDS: 11/24/2017 Last Visit: 07/16/2020 Next Visit: 10/14/2020 Last Refill: 07/29/2020 #30 and 0RF Pt sig: 1 tab qhs prn  Please Advise

## 2020-09-16 ENCOUNTER — Other Ambulatory Visit: Payer: Self-pay

## 2020-09-16 ENCOUNTER — Ambulatory Visit (INDEPENDENT_AMBULATORY_CARE_PROVIDER_SITE_OTHER): Payer: Medicare HMO

## 2020-09-16 DIAGNOSIS — G43709 Chronic migraine without aura, not intractable, without status migrainosus: Secondary | ICD-10-CM

## 2020-09-16 MED ORDER — METOCLOPRAMIDE HCL 5 MG/ML IJ SOLN
10.0000 mg | Freq: Once | INTRAMUSCULAR | Status: AC
  Administered 2020-09-16: 10 mg via INTRAMUSCULAR

## 2020-09-16 MED ORDER — KETOROLAC TROMETHAMINE 60 MG/2ML IM SOLN
60.0000 mg | Freq: Once | INTRAMUSCULAR | Status: AC
  Administered 2020-09-16: 60 mg via INTRAMUSCULAR

## 2020-09-16 MED ORDER — DIPHENHYDRAMINE HCL 50 MG/ML IJ SOLN
25.0000 mg | Freq: Once | INTRAMUSCULAR | Status: AC
  Administered 2020-09-16: 25 mg via INTRAMUSCULAR

## 2020-09-17 ENCOUNTER — Telehealth: Payer: Self-pay | Admitting: Neurology

## 2020-09-17 MED ORDER — PREDNISONE 10 MG (21) PO TBPK: ORAL_TABLET | ORAL | 0 refills | Status: DC

## 2020-09-17 NOTE — Telephone Encounter (Signed)
Pt advised script sent.

## 2020-09-17 NOTE — Telephone Encounter (Signed)
Patient states that the headache cocktail injection did not work she states that she never went to sleep it did not make her sleepy. She states the pain is still the same as it has been. She states that in the past  The prednisone has worked in the past please call

## 2020-09-17 NOTE — Telephone Encounter (Signed)
Pt states the headache cocktail, the Topiramate not helping  for this Migraine at all.

## 2020-09-17 NOTE — Telephone Encounter (Signed)
We can prescribe her a prednisone taper - 10mg  tablet:  60mg  day 1, then 50mg  day 2, then 40mg  day 3, then 30mg  day 4, then 20mg  day 5, then 10mg  day 6, then STOP.  Quantity 21, refills 0

## 2020-10-02 ENCOUNTER — Inpatient Hospital Stay (HOSPITAL_BASED_OUTPATIENT_CLINIC_OR_DEPARTMENT_OTHER): Payer: Medicare HMO | Admitting: Family

## 2020-10-02 ENCOUNTER — Other Ambulatory Visit: Payer: Self-pay

## 2020-10-02 ENCOUNTER — Inpatient Hospital Stay: Payer: Medicare HMO | Attending: Hematology & Oncology

## 2020-10-02 ENCOUNTER — Inpatient Hospital Stay: Payer: Medicare HMO

## 2020-10-02 ENCOUNTER — Encounter: Payer: Self-pay | Admitting: Family

## 2020-10-02 VITALS — BP 130/87 | HR 67 | Temp 98.5°F | Resp 17 | Ht 62.0 in | Wt 168.0 lb

## 2020-10-02 DIAGNOSIS — D696 Thrombocytopenia, unspecified: Secondary | ICD-10-CM

## 2020-10-02 DIAGNOSIS — E538 Deficiency of other specified B group vitamins: Secondary | ICD-10-CM

## 2020-10-02 DIAGNOSIS — D6959 Other secondary thrombocytopenia: Secondary | ICD-10-CM | POA: Insufficient documentation

## 2020-10-02 LAB — CBC WITH DIFFERENTIAL (CANCER CENTER ONLY)
Abs Immature Granulocytes: 0.01 10*3/uL (ref 0.00–0.07)
Basophils Absolute: 0.1 10*3/uL (ref 0.0–0.1)
Basophils Relative: 1 %
Eosinophils Absolute: 0.1 10*3/uL (ref 0.0–0.5)
Eosinophils Relative: 2 %
HCT: 44.5 % (ref 36.0–46.0)
Hemoglobin: 14.1 g/dL (ref 12.0–15.0)
Immature Granulocytes: 0 %
Lymphocytes Relative: 29 %
Lymphs Abs: 2 10*3/uL (ref 0.7–4.0)
MCH: 30.7 pg (ref 26.0–34.0)
MCHC: 31.7 g/dL (ref 30.0–36.0)
MCV: 96.9 fL (ref 80.0–100.0)
Monocytes Absolute: 0.4 10*3/uL (ref 0.1–1.0)
Monocytes Relative: 6 %
Neutro Abs: 4.2 10*3/uL (ref 1.7–7.7)
Neutrophils Relative %: 62 %
Platelet Count: 69 10*3/uL — ABNORMAL LOW (ref 150–400)
RBC: 4.59 MIL/uL (ref 3.87–5.11)
RDW: 13.1 % (ref 11.5–15.5)
WBC Count: 6.7 10*3/uL (ref 4.0–10.5)
nRBC: 0 % (ref 0.0–0.2)

## 2020-10-02 LAB — CMP (CANCER CENTER ONLY)
ALT: 16 U/L (ref 0–44)
AST: 17 U/L (ref 15–41)
Albumin: 4.6 g/dL (ref 3.5–5.0)
Alkaline Phosphatase: 68 U/L (ref 38–126)
Anion gap: 9 (ref 5–15)
BUN: 16 mg/dL (ref 8–23)
CO2: 26 mmol/L (ref 22–32)
Calcium: 9.8 mg/dL (ref 8.9–10.3)
Chloride: 107 mmol/L (ref 98–111)
Creatinine: 1.04 mg/dL — ABNORMAL HIGH (ref 0.44–1.00)
GFR, Estimated: 59 mL/min — ABNORMAL LOW (ref >60)
Glucose, Bld: 95 mg/dL (ref 70–99)
Potassium: 4 mmol/L (ref 3.5–5.1)
Sodium: 142 mmol/L (ref 135–145)
Total Bilirubin: 0.3 mg/dL (ref 0.3–1.2)
Total Protein: 7.4 g/dL (ref 6.5–8.1)

## 2020-10-02 LAB — PLATELET BY CITRATE

## 2020-10-02 LAB — VITAMIN B12: Vitamin B-12: 406 pg/mL (ref 180–914)

## 2020-10-02 MED ORDER — CYANOCOBALAMIN 1000 MCG/ML IJ SOLN
INTRAMUSCULAR | Status: AC
  Filled 2020-10-02: qty 1

## 2020-10-02 MED ORDER — CYANOCOBALAMIN 1000 MCG/ML IJ SOLN
1000.0000 ug | Freq: Once | INTRAMUSCULAR | Status: AC
  Administered 2020-10-02: 1000 ug via INTRAMUSCULAR

## 2020-10-02 NOTE — Patient Instructions (Signed)
Cyanocobalamin, Pyridoxine, and Folate What is this medicine? A multivitamin containing folic acid, vitamin B6, and vitamin B12. This medicine may be used for other purposes; ask your health care provider or pharmacist if you have questions. COMMON BRAND NAME(S): AllanFol RX, AllanTex, Av-Vite FB, B Complex with Folic Acid, ComBgen, FaBB, Folamin, Folastin, Folbalin, Folbee, Folbic, Folcaps, Folgard, Folgard RX, Folgard RX 2.2, Folplex, Folplex 2.2, Foltabs 800, Foltx, Homocysteine Formula, Niva-Fol, NuFol, TL Gard RX, Virt-Gard, Virt-Vite, Virt-Vite Forte, Vita-Respa What should I tell my health care provider before I take this medicine? They need to know if you have any of these conditions:  bleeding or clotting disorder  history of anemia of any type  other chronic health condition  an unusual or allergic reaction to vitamins, other medicines, foods, dyes, or preservatives  pregnant or trying to get pregnant  breast-feeding How should I use this medicine? Take by mouth with a glass of water. May take with food. Follow the directions on the prescription label. It is usually given once a day. Do not take your medicine more often than directed. Contact your pediatrician regarding the use of this medicine in children. Special care may be needed. Overdosage: If you think you have taken too much of this medicine contact a poison control center or emergency room at once. NOTE: This medicine is only for you. Do not share this medicine with others. What if I miss a dose? If you miss a dose, take it as soon as you can. If it is almost time for your next dose, take only that dose. Do not take double or extra doses. What may interact with this medicine?  levodopa This list may not describe all possible interactions. Give your health care provider a list of all the medicines, herbs, non-prescription drugs, or dietary supplements you use. Also tell them if you smoke, drink alcohol, or use illegal  drugs. Some items may interact with your medicine. What should I watch for while using this medicine? See your health care professional for regular checks on your progress. Remember that vitamin supplements do not replace the need for good nutrition from a balanced diet. What side effects may I notice from receiving this medicine? Side effects that you should report to your doctor or health care professional as soon as possible:  allergic reaction such as skin rash or difficulty breathing  vomiting Side effects that usually do not require medical attention (report to your doctor or health care professional if they continue or are bothersome):  nausea  stomach upset This list may not describe all possible side effects. Call your doctor for medical advice about side effects. You may report side effects to FDA at 1-800-FDA-1088. Where should I keep my medicine? Keep out of the reach of children. Most vitamins should be stored at controlled room temperature. Check your specific product directions. Protect from heat and moisture. Throw away any unused medicine after the expiration date. NOTE: This sheet is a summary. It may not cover all possible information. If you have questions about this medicine, talk to your doctor, pharmacist, or health care provider.  2020 Elsevier/Gold Standard (2008-01-14 00:59:55)  

## 2020-10-02 NOTE — Progress Notes (Signed)
Hematology and Oncology Follow Up Visit  Ebony Garcia [id number] [birth year] 69 y.o. 10/02/2020   Principle Diagnosis:  Medication induced thrombocytopenia B 12 deficiency  Current Therapy: Observation B 12 injection monthly   Interim History:  [name] is here today for follow-up and B 12. She is doing fairly well but notes occasional episodes of chest discomfort as well as right upper quadrant pain that comes ann goes. She states that she will let her cardiologist know. She also states that she will contact EMS if needed.  She has a lot of family stress which is hard for her.  She has occasional episodes of SOB and dizziness with exertion.  She feels that some of her symptoms are due to weight gain. She gained 10 lbs during the pandemic and she can tel that this effects her chest discomfort and SOB with movement.  No fever, chills, n/v, cough, rash, dizziness, SOB, chest pain, palpitations, abdominal pain or changes in bowel or bladder habits.  No swelling, tenderness, numbness or tingling in her extremities.  No falls or syncopal episodes.  Her appetite comes and goes but she is making an effort to stay well hydrated.  ECOG Performance Status: 1 - Symptomatic but completely ambulatory  Medications:  Allergies as of 10/02/2020      Reactions   Antihistamines, Diphenhydramine-type Other (See Comments)   Other Reaction: makes pt nervous   Diphenhydramine Hcl Other (See Comments)   Other Reaction: makes pt nervous   Fremanezumab-vfrm Rash, Other (See Comments)   Seizure, jerking   Clindamycin Other (See Comments)   She has not taken but per patient  "her mother had a reaction and she does not want it ever"   Hydrocodone-acetaminophen Other (See Comments)   REACTION: sensitive to  but can take for extreme pain per patient   Lisinopril    Restless , irregular sleep   Nortriptyline Other (See Comments)   UNKNOWN REACTION   Propoxyphene N-acetaminophen  Nausea Only   Sulfa Antibiotics Other (See Comments)   Unknown reaction   Tramadol Other (See Comments)   UNKNOWN REACTION      Medication List       Accurate as of October 02, 2020  2:12 PM. If you have any questions, ask your nurse or doctor.        STOP taking these medications   predniSONE 10 MG (21) Tbpk tablet Commonly known as: STERAPRED UNI-PAK 21 TAB Stopped by: Laverna Peace, NP     TAKE these medications   ALPRAZolam 1 MG tablet Commonly known as: XANAX TAKE 1 TABLET BY MOUTH EVERY DAY AT BEDTIME AS NEEDED FOR SLEEP   FLUoxetine 20 MG capsule Commonly known as: PROZAC Take 1 capsule (20 mg total) by mouth daily.   furosemide 20 MG tablet Commonly known as: LASIX TAKE 1 TABLET EVERY DAY   losartan 50 MG tablet Commonly known as: COZAAR Take 1 tablet (50 mg total) by mouth daily.   nitroGLYCERIN 0.4 MG SL tablet Commonly known as: NITROSTAT Place 1 tablet (0.4 mg total) under the tongue every 5 (five) minutes as needed for chest pain.   promethazine 25 MG tablet Commonly known as: PHENERGAN TAKE ONE TABLET BY MOUTH EVERY 6 HOURS AS NEEDED FOR NAUSEA AND VOMITING   topiramate 100 MG tablet Commonly known as: TOPAMAX Take 1 tablet (100 mg total) by mouth 2 (two) times daily.   triamcinolone ointment 0.1 % Commonly known as: KENALOG Apply topically as needed  Allergies:  Allergies  Allergen Reactions  . Antihistamines, Diphenhydramine-Type Other (See Comments)    Other Reaction: makes pt nervous  . Diphenhydramine Hcl Other (See Comments)    Other Reaction: makes pt nervous  . Fremanezumab-Vfrm Rash and Other (See Comments)    Seizure, jerking  . Clindamycin Other (See Comments)    She has not taken but per patient  "her mother had a reaction and she does not want it ever"  . Hydrocodone-Acetaminophen Other (See Comments)    REACTION: sensitive to  but can take for extreme pain per patient  . Lisinopril     Restless , irregular sleep   . Nortriptyline Other (See Comments)    UNKNOWN REACTION  . Propoxyphene N-Acetaminophen Nausea Only  . Sulfa Antibiotics Other (See Comments)    Unknown reaction  . Tramadol Other (See Comments)    UNKNOWN REACTION    Past Medical History, Surgical history, Social history, and Family History were reviewed and updated.  Review of Systems: All other 10 point review of systems is negative.   Physical Exam:  height is 5\' 2"  (1.575 m) and weight is 168 lb (76.2 kg). Her oral temperature is 98.5 F (36.9 C). Her blood pressure is 130/87 and her pulse is 67. Her respiration is 17 and oxygen saturation is 100%.   Wt Readings from Last 3 Encounters:  10/02/20 168 lb (76.2 kg)  08/19/20 166 lb 12.8 oz (75.7 kg)  07/16/20 163 lb (73.9 kg)    Ocular: Sclerae unicteric, pupils equal, round and reactive to light Ear-nose-throat: Oropharynx clear, dentition fair Lymphatic: No cervical or supraclavicular adenopathy Lungs no rales or rhonchi, good excursion bilaterally Heart regular rate and rhythm, no murmur appreciated Abd soft, nontender, positive bowel sounds, no liver or spleen tip noted on exam, no fluid wave  MSK no focal spinal tenderness, no joint edema Neuro: non-focal, well-oriented, appropriate affect Breasts: Deferred   Lab Results  Component Value Date   WBC 6.7 10/02/2020   HGB 14.1 10/02/2020   HCT 44.5 10/02/2020   MCV 96.9 10/02/2020   PLT 69 (L) 10/02/2020   No results found for: FERRITIN, IRON, TIBC, UIBC, IRONPCTSAT Lab Results  Component Value Date   RBC 4.59 10/02/2020   No results found for: KPAFRELGTCHN, LAMBDASER, KAPLAMBRATIO No results found for: IGGSERUM, IGA, IGMSERUM No results found for: Odetta Pink, SPEI   Chemistry      Component Value Date/Time   NA 142 07/01/2020 1337   NA 143 11/11/2017 1339   K 4.2 07/01/2020 1337   K 3.6 11/11/2017 1339   CL 107 07/01/2020 1337   CL 109 (H)  11/11/2017 1339   CO2 27 07/01/2020 1337   CO2 24 11/11/2017 1339   BUN 16 07/01/2020 1337   BUN 9 11/11/2017 1339   CREATININE 1.08 (H) 07/01/2020 1337   CREATININE 1.02 (H) 02/23/2020 1444      Component Value Date/Time   CALCIUM 10.0 07/01/2020 1337   CALCIUM 9.3 11/11/2017 1339   ALKPHOS 69 07/01/2020 1337   ALKPHOS 85 (H) 11/11/2017 1339   AST 16 07/01/2020 1337   ALT 16 07/01/2020 1337   ALT 24 11/11/2017 1339   BILITOT 0.4 07/01/2020 1337       Impression and Plan: [name] is a pleasant 69yo caucasian female with thrombocytopenia and B12 deficiency. Platelets are stable at 69, Hgb 14.1 and WBC count 6.7.  She did receive her B 12 injection today as planned.  Monthly B 12 with follow-up in 3 months.  She was encouraged to contact our office with any questions or concerns.   Laverna Peace, NP 10/27/20212:12 PM

## 2020-10-02 NOTE — Progress Notes (Signed)
Pt. Stable and asymptomatic at discharge.

## 2020-10-08 ENCOUNTER — Other Ambulatory Visit: Payer: Self-pay

## 2020-10-08 MED ORDER — PREDNISONE 10 MG (21) PO TBPK: ORAL_TABLET | ORAL | 0 refills | Status: DC

## 2020-10-10 ENCOUNTER — Encounter: Payer: Self-pay | Admitting: Family Medicine

## 2020-10-10 NOTE — Telephone Encounter (Signed)
Ok noted  

## 2020-10-14 ENCOUNTER — Ambulatory Visit: Payer: Medicare HMO | Admitting: Family Medicine

## 2020-10-14 ENCOUNTER — Encounter: Payer: Self-pay | Admitting: *Deleted

## 2020-10-15 ENCOUNTER — Other Ambulatory Visit: Payer: Self-pay | Admitting: Family Medicine

## 2020-10-15 DIAGNOSIS — F419 Anxiety disorder, unspecified: Secondary | ICD-10-CM

## 2020-10-15 NOTE — Telephone Encounter (Signed)
Last written: 09/09/20 Last ov: 07/16/20 Next ov: 10/18/20 Contract: NEED UDS: NEED

## 2020-10-18 ENCOUNTER — Ambulatory Visit (INDEPENDENT_AMBULATORY_CARE_PROVIDER_SITE_OTHER): Payer: Medicare HMO | Admitting: Family Medicine

## 2020-10-18 ENCOUNTER — Other Ambulatory Visit: Payer: Self-pay

## 2020-10-18 ENCOUNTER — Encounter: Payer: Self-pay | Admitting: Family Medicine

## 2020-10-18 VITALS — BP 118/88 | HR 84 | Temp 98.2°F | Resp 18 | Ht 62.0 in | Wt 168.0 lb

## 2020-10-18 DIAGNOSIS — Z79899 Other long term (current) drug therapy: Secondary | ICD-10-CM | POA: Diagnosis not present

## 2020-10-18 DIAGNOSIS — R079 Chest pain, unspecified: Secondary | ICD-10-CM | POA: Diagnosis not present

## 2020-10-18 DIAGNOSIS — Z8249 Family history of ischemic heart disease and other diseases of the circulatory system: Secondary | ICD-10-CM | POA: Diagnosis not present

## 2020-10-18 DIAGNOSIS — R300 Dysuria: Secondary | ICD-10-CM

## 2020-10-18 DIAGNOSIS — E785 Hyperlipidemia, unspecified: Secondary | ICD-10-CM | POA: Diagnosis not present

## 2020-10-18 DIAGNOSIS — E119 Type 2 diabetes mellitus without complications: Secondary | ICD-10-CM

## 2020-10-18 DIAGNOSIS — I10 Essential (primary) hypertension: Secondary | ICD-10-CM | POA: Diagnosis not present

## 2020-10-18 LAB — POC URINALSYSI DIPSTICK (AUTOMATED)
Bilirubin, UA: NEGATIVE
Blood, UA: NEGATIVE
Glucose, UA: NEGATIVE
Ketones, UA: NEGATIVE
Nitrite, UA: NEGATIVE
Protein, UA: NEGATIVE
Spec Grav, UA: 1.015 (ref 1.010–1.025)
Urobilinogen, UA: 0.2 E.U./dL
pH, UA: 6 (ref 5.0–8.0)

## 2020-10-18 LAB — TSH: TSH: 2.99 mIU/L (ref 0.40–4.50)

## 2020-10-18 MED ORDER — FAMOTIDINE 20 MG PO TABS: 20.0000 mg | ORAL_TABLET | Freq: Two times a day (BID) | ORAL | 4 refills | Status: DC

## 2020-10-18 NOTE — Progress Notes (Signed)
Subjective:    Patient ID: Ebony Garcia, female    DOB: 01-14-1951, 69 y.o.   MRN: [id number]  HPI    Patient here for chest pain and kidney pain and urinary urgency no dysuria    No fever  No heartburn or burping -- no otc ppi or antacid A few nights ago she screamed out with chest pain that radiated to her head and L arm ---it has not happened ag She also c/o numbness in feet that is worsening    Past Medical History:  Diagnosis Date  . Cancer ([place])   . CHF (congestive heart failure) ([place])    dystolic heart dysfunction  . Chronic kidney disease   . Depression   . Depression with anxiety   . Diabetes mellitus without complication ([place])    Diet controlled  . Hypertension   . Intertrigo   . Migraines   . Panic attacks     Review of Systems  Constitutional: Negative for appetite change, diaphoresis, fatigue and unexpected weight change.  Eyes: Negative for pain, redness and visual disturbance.  Respiratory: Negative for cough, chest tightness, shortness of breath and wheezing.   Cardiovascular: Negative for chest pain, palpitations and leg swelling.  Endocrine: Negative for cold intolerance, heat intolerance, polydipsia, polyphagia and polyuria.  Genitourinary: Positive for flank pain. Negative for difficulty urinating, dysuria and frequency.  Neurological: Negative for dizziness, light-headedness, numbness and headaches.       Objective:    Physical Exam Vitals and nursing note reviewed.  Constitutional:      Appearance: She is well-developed.  HENT:     Head: Normocephalic and atraumatic.  Eyes:     Conjunctiva/sclera: Conjunctivae normal.  Neck:     Thyroid: No thyromegaly.     Vascular: No carotid bruit or JVD.  Cardiovascular:     Rate and Rhythm: Normal rate and regular rhythm.     Heart sounds: Normal heart sounds. No murmur heard.   Pulmonary:     Effort: Pulmonary effort is normal. No respiratory distress.     Breath sounds: Normal breath  sounds. No wheezing or rales.  Chest:     Chest wall: No tenderness.  Musculoskeletal:     Cervical back: Normal range of motion and neck supple.  Neurological:     Mental Status: She is alert and oriented to person, place, and time.     BP 118/88 (BP Location: Right Arm, Patient Position: Sitting, Cuff Size: Normal)   Pulse 84   Temp 98.2 F (36.8 C) (Oral)   Resp 18   Ht 5\' 2"  (1.575 m)   Wt 168 lb (76.2 kg)   SpO2 98%   BMI 30.73 kg/m  Wt Readings from Last 3 Encounters:  10/18/20 168 lb (76.2 kg)  10/02/20 168 lb (76.2 kg)  08/19/20 166 lb 12.8 oz (75.7 kg)     Lab Results  Component Value Date   WBC 6.7 10/02/2020   HGB 14.1 10/02/2020   HCT 44.5 10/02/2020   PLT 69 (L) 10/02/2020   GLUCOSE 95 10/02/2020   CHOL 187 02/23/2020   TRIG 147 02/23/2020   HDL 53 02/23/2020   LDLDIRECT 121.0 09/20/2017   LDLCALC 108 (H) 02/23/2020   ALT 16 10/02/2020   AST 17 10/02/2020   NA 142 10/02/2020   K 4.0 10/02/2020   CL 107 10/02/2020   CREATININE 1.04 (H) 10/02/2020   BUN 16 10/02/2020   CO2 26 10/02/2020   TSH 3.31 05/14/2020  INR 0.93 07/19/2010   HGBA1C 5.6 03/13/2020   MICROALBUR 1.1 09/20/2017    CT ANGIO HEAD W OR WO CONTRAST  Result Date: 07/06/2019 CLINICAL DATA:  New onset of headaches after age 22. New onset headache over 50, evaluate for aneurysm. Additional history: Patient "off balance" , blurred vision, dizzy EXAM: CT ANGIOGRAPHY HEAD TECHNIQUE: Multidetector CT imaging of the head was performed using the standard protocol during bolus administration of intravenous contrast. Multiplanar CT image reconstructions and MIPs were obtained to evaluate the vascular anatomy. CONTRAST:  26mL ISOVUE-370 IOPAMIDOL (ISOVUE-370) INJECTION 76% COMPARISON:  Brain MRI 08/27/2015 FINDINGS: CT HEAD Brain: There is no acute intracranial hemorrhage or demarcated territorial infarction.No evidence of intracranial mass.No midline shift or extra-axial collection. Cerebral  volume is age appropriate. Vascular: Reported separately Skull: Normal. Negative for fracture or focal lesion. Sinuses: No significant paranasal sinus disease or mastoid effusion. Orbits: The imaged globes and orbits are unremarkable. CTA HEAD Anterior circulation: No large vessel occlusion or proximal high-grade arterial stenosis.No intracranial aneurysm is identified. Posterior circulation: No large vessel occlusion or proximal high-grade arterial stenosis.No intracranial aneurysm is identified. Venous sinuses: Within the limitations of contrast timing, no evidence of thrombosis. Anatomic variants: Diminutive intracranial left vertebral artery beyond the left PICA origin. Predominantly fetal origin of the bilateral posterior cerebral arteries. IMPRESSION: CT HEAD: Unremarkable non-contrast CT appearance of the brain. No evidence of intracranial mass or acute intracranial abnormality. CTA HEAD: No large vessel occlusion or proximal high-grade arterial stenosis. No intracranial aneurysm is identified. Electronically Signed   By: Kellie Simmering   On: 07/06/2019 10:33       Assessment & Plan:   Problem List Items Addressed This Visit      Unprioritized   Chest pain    ekg-- no change from previous  pepcid bid  Pt requesting to see Dr Percival Spanish again      Relevant Medications   famotidine (PEPCID) 20 MG tablet   Other Relevant Orders   EKG 12-Lead (Completed)   Ambulatory referral to Cardiology   Hemoglobin A1c   Lipid panel   Comprehensive metabolic panel   CBC with Differential/Platelet   H. pylori breath test   TSH   Essential hypertension    Well controlled, no changes to meds. Encouraged heart healthy diet such as the DASH diet and exercise as tolerated.       Hyperlipidemia    Encouraged heart healthy diet, increase exercise, avoid trans fats, consider a krill oil cap daily       Other Visit Diagnoses    High risk medication use    -  Primary   Relevant Orders   DRUG MONITORING,  PANEL 8 WITH CONFIRMATION, URINE   Diet-controlled diabetes mellitus ([place])       Relevant Orders   Hemoglobin A1c   Lipid panel   Comprehensive metabolic panel   CBC with Differential/Platelet   TSH   Family history of early CAD       Relevant Orders   Ambulatory referral to Cardiology   Dysuria       Relevant Orders   POCT Urinalysis Dipstick (Automated) (Completed)   Urine Culture       [name], DO

## 2020-10-18 NOTE — Patient Instructions (Signed)

## 2020-10-18 NOTE — Assessment & Plan Note (Signed)
Encouraged heart healthy diet, increase exercise, avoid trans fats, consider a krill oil cap daily 

## 2020-10-18 NOTE — Assessment & Plan Note (Signed)
ekg-- no change from previous  pepcid bid  Pt requesting to see Dr Percival Spanish again

## 2020-10-18 NOTE — Assessment & Plan Note (Signed)
Well controlled, no changes to meds. Encouraged heart healthy diet such as the DASH diet and exercise as tolerated.  °

## 2020-10-19 LAB — COMPREHENSIVE METABOLIC PANEL
AG Ratio: 1.8 (calc) (ref 1.0–2.5)
ALT: 18 U/L (ref 6–29)
AST: 17 U/L (ref 10–35)
Albumin: 4.4 g/dL (ref 3.6–5.1)
Alkaline phosphatase (APISO): 91 U/L (ref 37–153)
BUN/Creatinine Ratio: 13 (calc) (ref 6–22)
BUN: 13 mg/dL (ref 7–25)
CO2: 23 mmol/L (ref 20–32)
Calcium: 9.1 mg/dL (ref 8.6–10.4)
Chloride: 108 mmol/L (ref 98–110)
Creat: 1 mg/dL — ABNORMAL HIGH (ref 0.50–0.99)
Globulin: 2.4 g/dL (calc) (ref 1.9–3.7)
Glucose, Bld: 94 mg/dL (ref 65–99)
Potassium: 4 mmol/L (ref 3.5–5.3)
Sodium: 142 mmol/L (ref 135–146)
Total Bilirubin: 0.4 mg/dL (ref 0.2–1.2)
Total Protein: 6.8 g/dL (ref 6.1–8.1)

## 2020-10-19 LAB — CBC WITH DIFFERENTIAL/PLATELET
Absolute Monocytes: 456 cells/uL (ref 200–950)
Basophils Absolute: 61 cells/uL (ref 0–200)
Basophils Relative: 0.9 %
Eosinophils Absolute: 136 cells/uL (ref 15–500)
Eosinophils Relative: 2 %
HCT: 41.7 % (ref 35.0–45.0)
Hemoglobin: 13.8 g/dL (ref 11.7–15.5)
Lymphs Abs: 2013 cells/uL (ref 850–3900)
MCH: 31.1 pg (ref 27.0–33.0)
MCHC: 33.1 g/dL (ref 32.0–36.0)
MCV: 93.9 fL (ref 80.0–100.0)
MPV: 11.1 fL (ref 7.5–12.5)
Monocytes Relative: 6.7 %
Neutro Abs: 4134 cells/uL (ref 1500–7800)
Neutrophils Relative %: 60.8 %
Platelets: 89 10*3/uL — ABNORMAL LOW (ref 140–400)
RBC: 4.44 10*6/uL (ref 3.80–5.10)
RDW: 12.2 % (ref 11.0–15.0)
Total Lymphocyte: 29.6 %
WBC: 6.8 10*3/uL (ref 3.8–10.8)

## 2020-10-19 LAB — LIPID PANEL
Cholesterol: 240 mg/dL — ABNORMAL HIGH (ref <200)
HDL: 53 mg/dL (ref >=50)
LDL Cholesterol (Calc): 151 mg/dL (calc) — ABNORMAL HIGH
Non-HDL Cholesterol (Calc): 187 mg/dL (calc) — ABNORMAL HIGH (ref <130)
Total CHOL/HDL Ratio: 4.5 (calc) (ref <5.0)
Triglycerides: 216 mg/dL — ABNORMAL HIGH (ref <150)

## 2020-10-19 LAB — HEMOGLOBIN A1C
Hgb A1c MFr Bld: 5.5 % of total Hgb (ref <5.7)
Mean Plasma Glucose: 111 (calc)
eAG (mmol/L): 6.2 (calc)

## 2020-10-19 LAB — URINE CULTURE
MICRO NUMBER:: 11197121
Result:: NO GROWTH
SPECIMEN QUALITY:: ADEQUATE

## 2020-10-20 LAB — DRUG MONITORING, PANEL 8 WITH CONFIRMATION, URINE
6 Acetylmorphine: NEGATIVE ng/mL (ref <10)
Alcohol Metabolites: NEGATIVE ng/mL
Alphahydroxyalprazolam: NEGATIVE ng/mL (ref <25)
Alphahydroxymidazolam: NEGATIVE ng/mL (ref <50)
Alphahydroxytriazolam: NEGATIVE ng/mL (ref <50)
Aminoclonazepam: NEGATIVE ng/mL (ref <25)
Amphetamines: NEGATIVE ng/mL (ref <500)
Benzodiazepines: NEGATIVE ng/mL (ref <100)
Buprenorphine, Urine: NEGATIVE ng/mL (ref <5)
Cocaine Metabolite: NEGATIVE ng/mL (ref <150)
Creatinine: 22.9 mg/dL
Hydroxyethylflurazepam: NEGATIVE ng/mL (ref <50)
Lorazepam: NEGATIVE ng/mL (ref <50)
MDMA: NEGATIVE ng/mL (ref <500)
Marijuana Metabolite: NEGATIVE ng/mL (ref <20)
Nordiazepam: NEGATIVE ng/mL (ref <50)
Opiates: NEGATIVE ng/mL (ref <100)
Oxazepam: NEGATIVE ng/mL (ref <50)
Oxidant: NEGATIVE ug/mL
Oxycodone: NEGATIVE ng/mL (ref <100)
Temazepam: NEGATIVE ng/mL (ref <50)
pH: 5.4 (ref 4.5–9.0)

## 2020-10-20 LAB — DM TEMPLATE

## 2020-10-21 LAB — H. PYLORI BREATH TEST: H. pylori Breath Test: NOT DETECTED

## 2020-10-30 ENCOUNTER — Inpatient Hospital Stay: Payer: Medicare HMO | Attending: Hematology & Oncology

## 2020-10-30 ENCOUNTER — Other Ambulatory Visit: Payer: Self-pay

## 2020-10-30 VITALS — BP 136/81 | HR 74 | Temp 98.2°F | Resp 18

## 2020-10-30 DIAGNOSIS — E538 Deficiency of other specified B group vitamins: Secondary | ICD-10-CM | POA: Insufficient documentation

## 2020-10-30 MED ORDER — CYANOCOBALAMIN 1000 MCG/ML IJ SOLN
INTRAMUSCULAR | Status: AC
  Filled 2020-10-30: qty 1

## 2020-10-30 MED ORDER — CYANOCOBALAMIN 1000 MCG/ML IJ SOLN
1000.0000 ug | Freq: Once | INTRAMUSCULAR | Status: AC
  Administered 2020-10-30: 1000 ug via INTRAMUSCULAR

## 2020-10-30 NOTE — Patient Instructions (Signed)
Cyanocobalamin, Pyridoxine, and Folate What is this medicine? A multivitamin containing folic acid, vitamin B6, and vitamin B12. This medicine may be used for other purposes; ask your health care provider or pharmacist if you have questions. COMMON BRAND NAME(S): AllanFol RX, AllanTex, Av-Vite FB, B Complex with Folic Acid, ComBgen, FaBB, Folamin, Folastin, Folbalin, Folbee, Folbic, Folcaps, Folgard, Folgard RX, Folgard RX 2.2, Folplex, Folplex 2.2, Foltabs 800, Foltx, Homocysteine Formula, Niva-Fol, NuFol, TL Gard RX, Virt-Gard, Virt-Vite, Virt-Vite Forte, Vita-Respa What should I tell my health care provider before I take this medicine? They need to know if you have any of these conditions:  bleeding or clotting disorder  history of anemia of any type  other chronic health condition  an unusual or allergic reaction to vitamins, other medicines, foods, dyes, or preservatives  pregnant or trying to get pregnant  breast-feeding How should I use this medicine? Take by mouth with a glass of water. May take with food. Follow the directions on the prescription label. It is usually given once a day. Do not take your medicine more often than directed. Contact your pediatrician regarding the use of this medicine in children. Special care may be needed. Overdosage: If you think you have taken too much of this medicine contact a poison control center or emergency room at once. NOTE: This medicine is only for you. Do not share this medicine with others. What if I miss a dose? If you miss a dose, take it as soon as you can. If it is almost time for your next dose, take only that dose. Do not take double or extra doses. What may interact with this medicine?  levodopa This list may not describe all possible interactions. Give your health care provider a list of all the medicines, herbs, non-prescription drugs, or dietary supplements you use. Also tell them if you smoke, drink alcohol, or use illegal  drugs. Some items may interact with your medicine. What should I watch for while using this medicine? See your health care professional for regular checks on your progress. Remember that vitamin supplements do not replace the need for good nutrition from a balanced diet. What side effects may I notice from receiving this medicine? Side effects that you should report to your doctor or health care professional as soon as possible:  allergic reaction such as skin rash or difficulty breathing  vomiting Side effects that usually do not require medical attention (report to your doctor or health care professional if they continue or are bothersome):  nausea  stomach upset This list may not describe all possible side effects. Call your doctor for medical advice about side effects. You may report side effects to FDA at 1-800-FDA-1088. Where should I keep my medicine? Keep out of the reach of children. Most vitamins should be stored at controlled room temperature. Check your specific product directions. Protect from heat and moisture. Throw away any unused medicine after the expiration date. NOTE: This sheet is a summary. It may not cover all possible information. If you have questions about this medicine, talk to your doctor, pharmacist, or health care provider.  2020 Elsevier/Gold Standard (2008-01-14 00:59:55)  

## 2020-10-30 NOTE — Progress Notes (Signed)
Pt. stable and asymptomatic at discharge.

## 2020-11-11 ENCOUNTER — Encounter (HOSPITAL_COMMUNITY): Payer: Self-pay

## 2020-11-11 ENCOUNTER — Other Ambulatory Visit: Payer: Self-pay

## 2020-11-11 ENCOUNTER — Emergency Department (HOSPITAL_COMMUNITY): Payer: Medicare HMO

## 2020-11-11 ENCOUNTER — Emergency Department (HOSPITAL_COMMUNITY)
Admission: EM | Admit: 2020-11-11 | Discharge: 2020-11-12 | Disposition: A | Discharge status: Home / Self Care | Payer: Medicare HMO | Attending: Emergency Medicine | Admitting: Emergency Medicine

## 2020-11-11 DIAGNOSIS — R072 Precordial pain: Secondary | ICD-10-CM | POA: Diagnosis not present

## 2020-11-11 DIAGNOSIS — Z85828 Personal history of other malignant neoplasm of skin: Secondary | ICD-10-CM | POA: Diagnosis not present

## 2020-11-11 DIAGNOSIS — I130 Hypertensive heart and chronic kidney disease with heart failure and stage 1 through stage 4 chronic kidney disease, or unspecified chronic kidney disease: Secondary | ICD-10-CM | POA: Diagnosis not present

## 2020-11-11 DIAGNOSIS — I10 Essential (primary) hypertension: Secondary | ICD-10-CM | POA: Diagnosis not present

## 2020-11-11 DIAGNOSIS — N189 Chronic kidney disease, unspecified: Secondary | ICD-10-CM | POA: Insufficient documentation

## 2020-11-11 DIAGNOSIS — I5031 Acute diastolic (congestive) heart failure: Secondary | ICD-10-CM | POA: Insufficient documentation

## 2020-11-11 DIAGNOSIS — Z7901 Long term (current) use of anticoagulants: Secondary | ICD-10-CM | POA: Insufficient documentation

## 2020-11-11 DIAGNOSIS — I129 Hypertensive chronic kidney disease with stage 1 through stage 4 chronic kidney disease, or unspecified chronic kidney disease: Secondary | ICD-10-CM | POA: Diagnosis not present

## 2020-11-11 DIAGNOSIS — E1122 Type 2 diabetes mellitus with diabetic chronic kidney disease: Secondary | ICD-10-CM | POA: Insufficient documentation

## 2020-11-11 DIAGNOSIS — R3911 Hesitancy of micturition: Secondary | ICD-10-CM | POA: Insufficient documentation

## 2020-11-11 DIAGNOSIS — R0789 Other chest pain: Secondary | ICD-10-CM | POA: Diagnosis not present

## 2020-11-11 DIAGNOSIS — R079 Chest pain, unspecified: Secondary | ICD-10-CM | POA: Diagnosis not present

## 2020-11-11 LAB — TROPONIN I (HIGH SENSITIVITY): Troponin I (High Sensitivity): 5 ng/L (ref <18)

## 2020-11-11 LAB — CBC
HCT: 39.5 % (ref 36.0–46.0)
Hemoglobin: 13 g/dL (ref 12.0–15.0)
MCH: 31.3 pg (ref 26.0–34.0)
MCHC: 32.9 g/dL (ref 30.0–36.0)
MCV: 95 fL (ref 80.0–100.0)
Platelets: 73 10*3/uL — ABNORMAL LOW (ref 150–400)
RBC: 4.16 MIL/uL (ref 3.87–5.11)
RDW: 13 % (ref 11.5–15.5)
WBC: 6.6 10*3/uL (ref 4.0–10.5)
nRBC: 0 % (ref 0.0–0.2)

## 2020-11-11 LAB — BASIC METABOLIC PANEL
Anion gap: 10 (ref 5–15)
BUN: 11 mg/dL (ref 8–23)
CO2: 23 mmol/L (ref 22–32)
Calcium: 9.2 mg/dL (ref 8.9–10.3)
Chloride: 108 mmol/L (ref 98–111)
Creatinine, Ser: 0.97 mg/dL (ref 0.44–1.00)
GFR, Estimated: >60 mL/min (ref >60)
Glucose, Bld: 113 mg/dL — ABNORMAL HIGH (ref 70–99)
Potassium: 3.4 mmol/L — ABNORMAL LOW (ref 3.5–5.1)
Sodium: 141 mmol/L (ref 135–145)

## 2020-11-11 NOTE — ED Triage Notes (Signed)
Pt comes via [facility] for CP since last night, L sided, sharp, intermittent, no SOB, hx of CHF, PTA received 324 ASA

## 2020-11-12 ENCOUNTER — Emergency Department (HOSPITAL_COMMUNITY): Payer: Medicare HMO

## 2020-11-12 LAB — URINALYSIS, ROUTINE W REFLEX MICROSCOPIC
Bilirubin Urine: NEGATIVE
Glucose, UA: NEGATIVE mg/dL
Hgb urine dipstick: NEGATIVE
Ketones, ur: NEGATIVE mg/dL
Nitrite: NEGATIVE
Protein, ur: NEGATIVE mg/dL
Specific Gravity, Urine: 1.002 — ABNORMAL LOW (ref 1.005–1.030)
pH: 8 (ref 5.0–8.0)

## 2020-11-12 LAB — TROPONIN I (HIGH SENSITIVITY): Troponin I (High Sensitivity): 6 ng/L (ref <18)

## 2020-11-12 MED ORDER — CEPHALEXIN 500 MG PO CAPS: 500.0000 mg | ORAL_CAPSULE | Freq: Three times a day (TID) | ORAL | 0 refills | Status: DC

## 2020-11-12 NOTE — ED Notes (Signed)
Pt able to urinate but was unable to collect a sample. Provider more water and snacks, informed to left staff know when she feels she may need to pee again

## 2020-11-12 NOTE — Discharge Instructions (Signed)
Please read and follow all provided instructions.  Your diagnoses today include:  1. Precordial pain   2. Urinary hesitancy     Tests performed today include:  An EKG of your heart  A chest x-ray  Cardiac enzymes - a blood test for heart muscle damage  Blood counts and electrolytes  Urine test - shows a few infection cells  Urine culture - pending  Vital signs. See below for your results today.   Medications prescribed:   Keflex (cephalexin) - antibiotic  You have been prescribed an antibiotic medicine: take the entire course of medicine even if you are feeling better. Stopping early can cause the antibiotic not to work.  Take any prescribed medications only as directed.  Follow-up instructions: Please follow-up with your primary care provider as soon as you can for further evaluation of your symptoms.   Return instructions:  SEEK IMMEDIATE MEDICAL ATTENTION IF:  You have severe chest pain, especially if the pain is crushing or pressure-like and spreads to the arms, back, neck, or jaw, or if you have sweating, nausea (feeling sick to your stomach), or shortness of breath. THIS IS AN EMERGENCY. Don't wait to see if the pain will go away. Get medical help at once. Call 911 or 0 (operator). DO NOT drive yourself to the hospital.   Your chest pain gets worse and does not go away with rest.   You have an attack of chest pain lasting longer than usual, despite rest and treatment with the medications your caregiver has prescribed.   You wake from sleep with chest pain or shortness of breath.  You feel dizzy or faint.  You have chest pain not typical of your usual pain for which you originally saw your caregiver.   You have any other emergent concerns regarding your health.  Additional Information: Chest pain comes from many different causes. Your caregiver has diagnosed you as having chest pain that is not specific for one problem, but does not require admission.  You are  at low risk for an acute heart condition or other serious illness.   Your vital signs today were: BP (!) 153/87   Pulse (!) 48   Temp 97.9 F (36.6 C) (Oral)   Resp 18   SpO2 100%  If your blood pressure (BP) was elevated above 135/85 this visit, please have this repeated by your doctor within one month. --------------

## 2020-11-12 NOTE — ED Notes (Signed)
Pt refused to wear BP cuff for follow up vitals. Will ask again later.

## 2020-11-12 NOTE — ED Notes (Signed)
Pt unable to urinate, complaining of pressure and urge to go.  Bladder scan shows 354mL in bladder. Pt states she has not urinated since 4pm yesterday Has been having retention issues for past couple of months

## 2020-11-12 NOTE — ED Notes (Addendum)
Pt ambulatory to restroom, even steady gait. Denies feeling dizzy or lightheaded Concerned because she has been having urinary retention problems for the past couple of months Denies chest pain at this time, also denies N/V   Call light within reach. Provided pillow and warm blanket

## 2020-11-12 NOTE — ED Notes (Signed)
Discharge instructions provided to patient. Verbalized understanding. Alert and oriented. IV lock removed by NT. Ambulated with steady gait out of ED.

## 2020-11-12 NOTE — ED Provider Notes (Signed)
[facility] EMERGENCY DEPARTMENT Provider Note   CSN: [id number] Arrival date & time: 11/11/20  1907     History Chief Complaint  Patient presents with  . Chest Pain    Ebony Garcia is a 69 y.o. female.  Patient with history of chronic heart failure on furosemide, chronic kidney disease, hypertension --presents the emergency department for evaluation of chest pain.  Patient reports having intermittent sharp and squeezing pain in the mid chest without radiation, vomiting, or diaphoresis ongoing over the past couple of days.  Pain will last for several seconds and then resolve and then recur.  Symptoms are not associated with exertion.  In addition, she has had some vague pains in her leg, mainly left leg over the past couple of months.  She reports intermittent episodes of shortness of breath, not necessarily with the pain, recently as well.  These seem to occur randomly.  She recalls one particularly wearing episode of pain where she developed pain in her chest and then migrated to her head and then migrated to her left arm.  This only occurred one time.  Unfortunately the patient has been waiting in the waiting room overnight due to prolonged wait times.  She reports being unable to urinate despite drinking water.  She did urinate while in the waiting room at 4 AM.        Past Medical History:  Diagnosis Date  . Cancer ([place])   . CHF (congestive heart failure) ([place])    dystolic heart dysfunction  . Chronic kidney disease   . Depression   . Depression with anxiety   . Diabetes mellitus without complication ([place])    Diet controlled  . Hypertension   . Intertrigo   . Migraines   . Panic attacks     Patient Active Problem List   Diagnosis Date Noted  . Chest pain 05/14/2020  . Viral syndrome 04/10/2019  . Myalgia 02/02/2019  . Fatigue 02/02/2019  . Estrogen deficiency 02/02/2019  . Hair loss 02/02/2019  . B12 deficiency 10/07/2018  . Intractable  migraine with aura without status migrainosus 08/03/2018  . Thrombocytopenia ([place]) 10/06/2017  . Auditory hallucination 10/06/2017  . Depression with anxiety 05/14/2017  . Squamous cell carcinoma in situ of skin 12/26/2015  . Chest pain with moderate risk of acute coronary syndrome 02/07/2015  . Anxiety 02/07/2015  . Palpitation 04/17/2014  . De Quervain's tenosynovitis, left 05/23/2013  . Acute diastolic CHF (congestive heart failure) ([place]) 04/30/2013  . Bradycardia 04/30/2013  . INTERTRIGO 11/08/2009  . Migraine variant 08/15/2008  . NONSPECIFIC ABNORMAL ELECTROCARDIOGRAM 08/15/2008  . Essential hypertension 08/09/2008  . Hyperlipidemia 02/27/2008  . Dysmetabolic syndrome X 40/98/1191  . WEIGHT GAIN 11/02/2007  . POLYDIPSIA 11/02/2007  . Depressive disorder, not elsewhere classified 05/31/2007  . RENAL CALCULUS 02/21/2007    Past Surgical History:  Procedure Laterality Date  . ABDOMINAL HYSTERECTOMY    . BREAST SURGERY    . DILATION AND CURETTAGE OF UTERUS    . NASAL SEPTUM SURGERY    . papaloma Right   . TONSILLECTOMY AND ADENOIDECTOMY    . TOTAL ABDOMINAL HYSTERECTOMY W/ BILATERAL SALPINGOOPHORECTOMY     for fibroids and migraines  . TUBAL LIGATION       OB History   No obstetric history on file.     Family History  Problem Relation Age of Onset  . Crohn's disease Mother   . Atrial fibrillation Mother   . Heart failure Mother   . Bladder  Cancer Father   . Cancer Father        bladder  . Bipolar disorder Brother   . Cancer Brother   . Bipolar disorder Sister   . Heart attack Maternal Grandmother        >65  . Cancer Maternal Aunt   . Colon cancer Maternal Uncle   . Cancer Paternal Grandfather   . Cancer Maternal Aunt   . Cancer Cousin   . Anemia Cousin   . Diabetes Neg Hx   . Stroke Neg Hx     Social History   Tobacco Use  . Smoking status: Never Smoker  . Smokeless tobacco: Never Used  Vaping Use  . Vaping Use: Never used  Substance Use  Topics  . Alcohol use: No    Alcohol/week: 0.0 standard drinks  . Drug use: No    Home Medications Prior to Admission medications   Medication Sig Start Date End Date Taking? Authorizing Provider  ALPRAZolam Duanne Moron) 1 MG tablet TAKE 1 TABLET BY MOUTH EVERY DAY AT BEDTIME AS NEEDED FOR SLEEP 10/15/20   Carollee Herter, Alferd Apa, DO  famotidine (PEPCID) 20 MG tablet Take 1 tablet (20 mg total) by mouth 2 (two) times daily. 10/18/20   [name], DO  FLUoxetine (PROZAC) 20 MG capsule Take 1 capsule (20 mg total) by mouth daily. 08/15/20   Roma Schanz R, DO  furosemide (LASIX) 20 MG tablet TAKE 1 TABLET EVERY DAY 08/15/20   Carollee Herter, Alferd Apa, DO  losartan (COZAAR) 50 MG tablet Take 1 tablet (50 mg total) by mouth daily. 08/15/20   [name], DO  nitroGLYCERIN (NITROSTAT) 0.4 MG SL tablet Place 1 tablet (0.4 mg total) under the tongue every 5 (five) minutes as needed for chest pain. 08/19/15   Burtis Junes, NP  promethazine (PHENERGAN) 25 MG tablet TAKE ONE TABLET BY MOUTH EVERY 6 HOURS AS NEEDED FOR NAUSEA AND VOMITING 12/06/19   Carollee Herter, Alferd Apa, DO  topiramate (TOPAMAX) 100 MG tablet Take 1 tablet (100 mg total) by mouth 2 (two) times daily. 04/18/20   Pieter Partridge, DO  triamcinolone ointment (KENALOG) 0.1 % Apply topically as needed 01/24/16   [provider]    Allergies    Antihistamines, diphenhydramine-type; Diphenhydramine hcl; Fremanezumab-vfrm; Clindamycin; Hydrocodone-acetaminophen; Lisinopril; Nortriptyline; Propoxyphene n-acetaminophen; Sulfa antibiotics; and Tramadol  Review of Systems   Review of Systems  Constitutional: Negative for diaphoresis and fever.  Eyes: Negative for redness.  Respiratory: Positive for shortness of breath (intermittent). Negative for cough.   Cardiovascular: Positive for chest pain (intermittent) and leg swelling. Negative for palpitations.  Gastrointestinal: Negative for abdominal pain, nausea and vomiting.   Genitourinary: Negative for dysuria.  Musculoskeletal: Positive for myalgias. Negative for back pain and neck pain.  Skin: Negative for rash.  Neurological: Negative for syncope and light-headedness.  Psychiatric/Behavioral: The patient is not nervous/anxious.     Physical Exam Updated Vital Signs BP 136/76   Pulse (!) 59   Temp 97.9 F (36.6 C) (Oral)   Resp 14   SpO2 100%   Physical Exam Vitals and nursing note reviewed.  Constitutional:      Appearance: She is well-developed. She is not diaphoretic.  HENT:     Head: Normocephalic and atraumatic.     Mouth/Throat:     Mouth: Mucous membranes are not dry.  Eyes:     Conjunctiva/sclera: Conjunctivae normal.  Neck:     Vascular: Normal carotid pulses. No carotid  bruit or JVD.     Trachea: Trachea normal. No tracheal deviation.  Cardiovascular:     Rate and Rhythm: Normal rate and regular rhythm.     Pulses: No decreased pulses.     Heart sounds: Normal heart sounds, S1 normal and S2 normal. No murmur heard.   Pulmonary:     Effort: Pulmonary effort is normal. No respiratory distress.     Breath sounds: No wheezing.     Comments: No crackles or rales. Chest:     Chest wall: No tenderness.  Abdominal:     General: Bowel sounds are normal.     Palpations: Abdomen is soft.     Tenderness: There is no abdominal tenderness. There is no guarding or rebound.  Musculoskeletal:        General: Normal range of motion.     Cervical back: Normal range of motion and neck supple. No muscular tenderness.     Right lower leg: No tenderness. No edema.     Left lower leg: No tenderness. No edema.     Comments: No tenderness or gross edema at time of exam on either lower extremity.  Skin:    General: Skin is warm and dry.     Coloration: Skin is not pale.  Neurological:     Mental Status: She is alert.     ED Results / Procedures / Treatments   Labs (all labs ordered are listed, but only abnormal results are displayed) Labs  Reviewed  BASIC METABOLIC PANEL - Abnormal; Notable for the following components:      Result Value   Potassium 3.4 (*)    Glucose, Bld 113 (*)    All other components within normal limits  CBC - Abnormal; Notable for the following components:   Platelets 73 (*)    All other components within normal limits  URINALYSIS, ROUTINE W REFLEX MICROSCOPIC - Abnormal; Notable for the following components:   Color, Urine COLORLESS (*)    Specific Gravity, Urine 1.002 (*)    Leukocytes,Ua MODERATE (*)    Bacteria, UA RARE (*)    All other components within normal limits  URINE CULTURE  TROPONIN I (HIGH SENSITIVITY)  TROPONIN I (HIGH SENSITIVITY)    EKG EKG Interpretation  Date/Time:  Monday November 11 2020 19:08:03 EST Ventricular Rate:  65 PR Interval:  154 QRS Duration: 86 QT Interval:  422 QTC Calculation: 438 R Axis:   30 Text Interpretation: Normal sinus rhythm Nonspecific ST and T wave abnormality Abnormal ECG No significant change since last tracing Confirmed by Dorie Rank [phone]) on 11/12/2020 10:59:03 AM   Radiology DG Chest 2 View  Result Date: 11/11/2020 CLINICAL DATA:  Chest pain. Additional history provided: History of CHF. EXAM: CHEST - 2 VIEW COMPARISON:  Prior chest radiographs 11/03/2016 and earlier. FINDINGS: Heart size at the upper limits of normal, unchanged. No appreciable airspace consolidation or pulmonary edema. No evidence of pleural effusion or pneumothorax. No acute bony abnormality identified. Thoracic spondylosis. IMPRESSION: No evidence of acute cardiopulmonary abnormality. Electronically Signed   By: Kellie Simmering DO   On: 11/11/2020 20:17    Procedures Procedures (including critical care time)  Medications Ordered in ED Medications - No data to display  ED Course  I have reviewed the triage vital signs and the nursing notes.  Pertinent labs & imaging results that were available during my care of the patient were reviewed by me and considered in my  medical decision making (see chart for details).  Clinical Course as of Nov 12 1516  Tue Nov 12, 2020  1058 Labs reviewed.  CBC and metabolic panel normal.   [JK]  1058 Chest x-ray without acute finding   [JK]  1059 Serial troponins normal   [JK]    Clinical Course User Index [JK] Dorie Rank, MD   Patient seen and examined.    Vital signs reviewed and are as follows: BP (!) 153/87   Pulse (!) 48   Temp 97.9 F (36.6 C) (Oral)   Resp 18   SpO2 100%   Patient with extended ED wait time.  Troponin negative x2.  Chest x-ray and EKG unremarkable.  Discussed all results with patient at bedside.  We discussed how to proceed at this point.  We discussed ultrasound of the left lower extremity, d-dimer testing to further evaluate for blood clot.  After discussion, patient declines.  Patient is unable to urinate.  She is concerned of urinary retention.  Discussed Foley catheter versus additional attempts at urination.  Patient would like to try to urinate again.  Patient was able to urinate however did not collect urine sample.  Awaiting recollection of urine sample.  Urine collected.  There are a few white cells in the urine.  Prescription for Keflex given.  Urine culture collected, pending.  Patient updated on results.  She is comfortable with discharged home at this time.  She has cardiology appointment in 3 days which she is encouraged to keep.  She states that she is planning not to take furosemide over the next 2 days.  I encouraged her to discuss this decision with her cardiologist.  Otherwise, patient encouraged to follow-up with PCP regarding possible GI etiology of her chest pain.  She would like to use Tums.  She has been encouraged to take Pepcid in the past but decided not to because of possible side effects.  Patient was counseled to return with severe chest pain, especially if the pain is crushing or pressure-like and spreads to the arms, back, neck, or jaw, or if they have  sweating, nausea, or shortness of breath with the pain. They were encouraged to call 911 with these symptoms.   The patient verbalized understanding and agreed.    MDM Rules/Calculators/A&P                          Precordial pain: Troponin negative x2, EKG unchanged.  Chest x-ray is clear.  Doubt ACS given character of pain.  Patient has previously scheduled cardiology follow-up in 3 days.  PE possible but felt lower risk.  Discussed ultrasound and D-dimer with patient and she would like to defer this at this time.  Urinary hesitancy: Soft UTI findings on UA.  Urine culture, keflex.   Final Clinical Impression(s) / ED Diagnoses Final diagnoses:  Precordial pain  Urinary hesitancy    Rx / DC Orders ED Discharge Orders         Ordered    cephALEXin (KEFLEX) 500 MG capsule  3 times daily        11/12/20 1458           Carlisle Cater, PA-C 11/12/20 1519    Veryl Speak, MD 11/13/20 2224

## 2020-11-13 LAB — URINE CULTURE: Culture: <10000 — AB

## 2020-11-15 ENCOUNTER — Ambulatory Visit: Payer: Medicare HMO | Admitting: Cardiology

## 2020-11-15 ENCOUNTER — Encounter: Payer: Self-pay | Admitting: Family Medicine

## 2020-11-15 NOTE — Telephone Encounter (Signed)
Will need er f/u if symptoms persist

## 2020-11-21 ENCOUNTER — Telehealth: Payer: Self-pay | Admitting: Family Medicine

## 2020-11-21 DIAGNOSIS — R339 Retention of urine, unspecified: Secondary | ICD-10-CM

## 2020-11-21 NOTE — Telephone Encounter (Signed)
Urine culture was negative---- < 10000 colonies We can refer to urology

## 2020-11-21 NOTE — Telephone Encounter (Signed)
Patient states she finished all her antibiotic and she still has urinary retention. Patient states Dr. Etter Sjogren told her to call with an update and she will change medication

## 2020-11-22 NOTE — Telephone Encounter (Signed)
Pt advised of lab and referral.

## 2020-11-25 ENCOUNTER — Other Ambulatory Visit: Payer: Self-pay | Admitting: Family Medicine

## 2020-11-25 DIAGNOSIS — F419 Anxiety disorder, unspecified: Secondary | ICD-10-CM

## 2020-11-25 NOTE — Telephone Encounter (Signed)
Requesting: alprazolam 1mg  Contract: 10/18/2020 UDS: 10/18/2020 Last Visit: 10/18/2020 Next Visit: 04/17/2021 Last Refill: 10/15/2020 #30 and 0RF  Please Advise

## 2020-11-26 ENCOUNTER — Other Ambulatory Visit: Payer: Self-pay | Admitting: Neurology

## 2020-12-02 ENCOUNTER — Inpatient Hospital Stay: Payer: Medicare HMO

## 2020-12-02 ENCOUNTER — Other Ambulatory Visit: Payer: Self-pay

## 2020-12-02 DIAGNOSIS — L304 Erythema intertrigo: Secondary | ICD-10-CM | POA: Insufficient documentation

## 2020-12-02 DIAGNOSIS — F410 Panic disorder [episodic paroxysmal anxiety] without agoraphobia: Secondary | ICD-10-CM | POA: Insufficient documentation

## 2020-12-02 DIAGNOSIS — F32A Depression, unspecified: Secondary | ICD-10-CM | POA: Insufficient documentation

## 2020-12-02 DIAGNOSIS — G43909 Migraine, unspecified, not intractable, without status migrainosus: Secondary | ICD-10-CM | POA: Insufficient documentation

## 2020-12-02 DIAGNOSIS — C801 Malignant (primary) neoplasm, unspecified: Secondary | ICD-10-CM | POA: Insufficient documentation

## 2020-12-02 DIAGNOSIS — I10 Essential (primary) hypertension: Secondary | ICD-10-CM | POA: Insufficient documentation

## 2020-12-02 DIAGNOSIS — N189 Chronic kidney disease, unspecified: Secondary | ICD-10-CM | POA: Insufficient documentation

## 2020-12-02 DIAGNOSIS — E119 Type 2 diabetes mellitus without complications: Secondary | ICD-10-CM | POA: Insufficient documentation

## 2020-12-02 DIAGNOSIS — I509 Heart failure, unspecified: Secondary | ICD-10-CM | POA: Insufficient documentation

## 2020-12-03 ENCOUNTER — Ambulatory Visit: Payer: Medicare HMO | Admitting: Cardiology

## 2020-12-11 ENCOUNTER — Other Ambulatory Visit: Payer: Self-pay

## 2020-12-11 ENCOUNTER — Inpatient Hospital Stay: Payer: Medicare HMO | Attending: Hematology & Oncology

## 2020-12-11 VITALS — BP 119/76 | HR 63 | Temp 98.0°F | Resp 20

## 2020-12-11 DIAGNOSIS — E538 Deficiency of other specified B group vitamins: Secondary | ICD-10-CM | POA: Diagnosis not present

## 2020-12-11 MED ORDER — CYANOCOBALAMIN 1000 MCG/ML IJ SOLN
INTRAMUSCULAR | Status: AC
  Filled 2020-12-11: qty 1

## 2020-12-11 MED ORDER — CYANOCOBALAMIN 1000 MCG/ML IJ SOLN
1000.0000 ug | Freq: Once | INTRAMUSCULAR | Status: AC
  Administered 2020-12-11: 1000 ug via INTRAMUSCULAR

## 2020-12-11 NOTE — Patient Instructions (Signed)

## 2020-12-11 NOTE — Progress Notes (Signed)
Patient leaves ambulatory in no acute distress.

## 2020-12-18 ENCOUNTER — Ambulatory Visit: Payer: Medicare HMO | Admitting: Cardiology

## 2020-12-18 ENCOUNTER — Encounter: Payer: Self-pay | Admitting: Cardiology

## 2020-12-18 ENCOUNTER — Other Ambulatory Visit: Payer: Self-pay

## 2020-12-18 VITALS — BP 132/78 | HR 75 | Ht 62.0 in | Wt 172.0 lb

## 2020-12-18 DIAGNOSIS — R079 Chest pain, unspecified: Secondary | ICD-10-CM | POA: Diagnosis not present

## 2020-12-18 DIAGNOSIS — E782 Mixed hyperlipidemia: Secondary | ICD-10-CM | POA: Diagnosis not present

## 2020-12-18 DIAGNOSIS — R072 Precordial pain: Secondary | ICD-10-CM | POA: Diagnosis not present

## 2020-12-18 DIAGNOSIS — R0602 Shortness of breath: Secondary | ICD-10-CM | POA: Diagnosis not present

## 2020-12-18 DIAGNOSIS — I10 Essential (primary) hypertension: Secondary | ICD-10-CM | POA: Diagnosis not present

## 2020-12-18 MED ORDER — ASPIRIN EC 81 MG PO TBEC: 81.0000 mg | DELAYED_RELEASE_TABLET | Freq: Every day | ORAL | 11 refills | Status: AC

## 2020-12-18 MED ORDER — METOPROLOL TARTRATE 100 MG PO TABS: ORAL_TABLET | ORAL | 0 refills | Status: DC

## 2020-12-18 NOTE — Progress Notes (Signed)
Cardiology Consultation:    Date:  12/18/2020   ID:  Ebony Garcia, DOB 1951/11/20, MRN [id number]  PCP:  [name], DO  Cardiologist:  Jenne Campus, MD   Referring MD: Carollee Herter, Alferd Apa, *   Chief Complaint  Patient presents with  . Chest Pain    History of Present Illness:    Ebony Garcia is a 70 y.o. female who is being seen today for the evaluation of chest pain at the request of Carollee Herter, Alferd Apa, *.  With past medical history significant for dyslipidemia, diastolic congestive heart rate diagnosed in 2014, B12 deficiency, some anxiety some depression.  She was referred to [place] because of episodes of chest pain.  She described pain happen at different time.  Not with exercise.  She said pain can last for few minutes there is no sweating but there is some shortness of breath associated with this sensation she graded sensation on 5 scale up to 10 she will describe different kind of pain which is sharp abrupt stopping going from her chest to the head and towards the left arm this lasted very short.  Time.  She does not exercise on the regular basis and she admits also that she is very sedentary because of bandemia.  Recently she also ended up being in emergency room secondary to urinary retention.  She spent 21 hours in the emergency room and eventually end up leaving without final diagnosis.  Past Medical History:  Diagnosis Date  . Acute diastolic CHF (congestive heart failure) ([place]) 04/30/2013   4/85-46/27 acute diastolic heart failure  BNP 1528 2 D echocardiogram essentially negative  Chest x-ray revealed cardiomegaly with costophrenic angle blunting and suggestion of possible interstitial vascular accentuation; no frank heart failure present. 10 pound weight loss with 20 mg of furosemide daily  No definite etiology of the cardiomegaly or acute diastolic failure   . Anxiety 02/07/2015  . Auditory hallucination 10/06/2017  . B12 deficiency 10/07/2018  .  Bradycardia 04/30/2013  . Cancer ([place])   . Chest pain 05/14/2020  . Chest pain with moderate risk of acute coronary syndrome 02/07/2015  . CHF (congestive heart failure) ([place])    dystolic heart dysfunction  . Chronic kidney disease   . De Quervain's tenosynovitis, left 05/23/2013  . Depression   . Depression with anxiety   . Depressive disorder, not elsewhere classified 05/31/2007   Centricity Description: DISORDER, DEPRESSIVE NEC Qualifier: Diagnosis of  By: Linna Darner MD, [place] Octave, [place]   . Diabetes mellitus without complication ([place])    Diet controlled  . Dysmetabolic syndrome X 0/35/0093   Qualifier: Diagnosis of  By: Linna Darner MD, [place]  A1c 6.3% in 2/12; TG 278   . Essential hypertension 08/09/2008   Qualifier: Diagnosis of  By: Linna Darner MD, Gwyndolyn Saxon    . Estrogen deficiency 02/02/2019  . Estrogen deficiency 02/02/2019  . Fatigue 02/02/2019  . Hair loss 02/02/2019  . Hyperlipidemia 02/27/2008   Qualifier: Diagnosis of  By: Linna Darner MD, Gwyndolyn Saxon    . Hypertension   . Intertrigo   . INTERTRIGO 11/08/2009   Qualifier: Diagnosis of  By: Inda Castle FNP, [place]   . Intractable migraine with aura without status migrainosus 08/03/2018  . Migraine variant 08/15/2008   Qualifier: Diagnosis of  By: Linna Darner MD, Gwyndolyn Saxon   Onset:initially @ age 67;recurrence age 28 during 1st trimester. Recurrence @ 39. No prodrome or aura Previously on Tryptans & Topamax with temporary relief  but tolerance appeared Botox by Dr Leward Quan 09/2010  most effective    . Migraines   . Myalgia 02/02/2019  . NONSPECIFIC ABNORMAL ELECTROCARDIOGRAM 08/15/2008   Qualifier: Diagnosis of  By: Linna Darner MD, Gwyndolyn Saxon    . Palpitation 04/17/2014  . Panic attacks   . Panic attacks   . Polydipsia 11/02/2007   Qualifier: Diagnosis of  By: Linna Darner MD, [place]    . RENAL CALCULUS 02/21/2007   Qualifier: Diagnosis of  By: Allen Norris    . Squamous cell carcinoma in situ of skin 12/26/2015  .  Thrombocytopenia ([place]) 10/06/2017  . Viral syndrome 04/10/2019  . WEIGHT GAIN 11/02/2007   Qualifier: Diagnosis of  By: Linna Darner MD, Gwyndolyn Saxon      Past Surgical History:  Procedure Laterality Date  . ABDOMINAL HYSTERECTOMY    . BREAST SURGERY    . DILATION AND CURETTAGE OF UTERUS    . NASAL SEPTUM SURGERY    . papaloma Right   . TONSILLECTOMY AND ADENOIDECTOMY    . TOTAL ABDOMINAL HYSTERECTOMY W/ BILATERAL SALPINGOOPHORECTOMY     for fibroids and migraines  . TUBAL LIGATION      Current Medications: Current Meds  Medication Sig  . ALPRAZolam (XANAX) 1 MG tablet TAKE 1 TABLET BY MOUTH EVERY DAY AT BEDTIME AS NEEDED FOR SLEEP  . FLUoxetine (PROZAC) 20 MG capsule Take 1 capsule (20 mg total) by mouth daily.  . furosemide (LASIX) 20 MG tablet TAKE 1 TABLET EVERY DAY  . losartan (COZAAR) 50 MG tablet Take 1 tablet (50 mg total) by mouth daily.  . nitroGLYCERIN (NITROSTAT) 0.4 MG SL tablet Place 1 tablet (0.4 mg total) under the tongue every 5 (five) minutes as needed for chest pain.  . promethazine (PHENERGAN) 25 MG tablet TAKE ONE TABLET BY MOUTH EVERY 6 HOURS AS NEEDED FOR NAUSEA AND VOMITING  . topiramate (TOPAMAX) 100 MG tablet TAKE 1 TABLET TWICE DAILY  . triamcinolone ointment (KENALOG) 0.1 % Apply topically as needed     Allergies:   Antihistamines, diphenhydramine-type; Diphenhydramine hcl; Fremanezumab-vfrm; Clindamycin; Hydrocodone-acetaminophen; Lisinopril; Nortriptyline; Propoxyphene n-acetaminophen; Sulfa antibiotics; and Tramadol   Social History   Socioeconomic History  . Marital status: Divorced    Spouse name: Not on file  . Number of children: 3  . Years of education: Not on file  . Highest education level: Not on file  Occupational History    Employer: UNEMPLOYED  Tobacco Use  . Smoking status: Never Smoker  . Smokeless tobacco: Never Used  Vaping Use  . Vaping Use: Never used  Substance and Sexual Activity  . Alcohol use: No    Alcohol/week: 0.0 standard  drinks  . Drug use: No  . Sexual activity: Not Currently    Partners: Male  Other Topics Concern  . Not on file  Social History Narrative   Lives alone.        Patient is right-handed. She lives on the first floor.      Social Determinants of Health   Financial Resource Strain: Not on file  Food Insecurity: Not on file  Transportation Needs: Not on file  Physical Activity: Not on file  Stress: Not on file  Social Connections: Not on file     Family History: The patient's family history includes Anemia in her cousin; Atrial fibrillation in her mother; Bipolar disorder in her brother and sister; Bladder Cancer in her father; Cancer in her brother, cousin, father, maternal aunt, maternal aunt, and paternal grandfather; Colon cancer in her maternal uncle;  Crohn's disease in her mother; Heart attack in her maternal grandmother; Heart failure in her mother. There is no history of Diabetes or Stroke. ROS:   Please see the history of present illness.    All 14 point review of systems negative except as described per history of present illness.  EKGs/Labs/Other Studies Reviewed:    The following studies were reviewed today:   EKG:  EKG is  ordered today.  The ekg ordered today demonstrates normal sinus rhythm, normal P interval, nonspecific ST segment changes.  Recent Labs: 10/18/2020: ALT 18; TSH 2.99 11/11/2020: BUN 11; Creatinine, Ser 0.97; Hemoglobin 13.0; Platelets 73; Potassium 3.4; Sodium 141  Recent Lipid Panel    Component Value Date/Time   CHOL 240 (H) 10/18/2020 1433   TRIG 216 (H) 10/18/2020 1433   HDL 53 10/18/2020 1433   CHOLHDL 4.5 10/18/2020 1433   VLDL 34.4 08/03/2019 1027   LDLCALC 151 (H) 10/18/2020 1433   LDLDIRECT 121.0 09/20/2017 1119    Physical Exam:    VS:  BP 132/78 (BP Location: Right Arm, Patient Position: Sitting)   Pulse 75   Ht 5\' 2"  (1.575 m)   Wt 172 lb (78 kg)   SpO2 98%   BMI 31.46 kg/m     Wt Readings from Last 3 Encounters:   12/18/20 172 lb (78 kg)  10/18/20 168 lb (76.2 kg)  10/02/20 168 lb (76.2 kg)     GEN:  Well nourished, well developed in no acute distress HEENT: Normal NECK: No JVD; No carotid bruits LYMPHATICS: No lymphadenopathy CARDIAC: RRR, no murmurs, no rubs, no gallops RESPIRATORY:  Clear to auscultation without rales, wheezing or rhonchi  ABDOMEN: Soft, non-tender, non-distended MUSCULOSKELETAL:  No edema; No deformity  SKIN: Warm and dry NEUROLOGIC:  Alert and oriented x 3 PSYCHIATRIC:  Normal affect   ASSESSMENT:    1. Chest pain of uncertain etiology   2. Precordial pain   3. Mixed hyperlipidemia   4. Essential hypertension    PLAN:    In order of problems listed above:  1. Chest pain which is very atypical.  Happening at rest and not related to exercise.  I still think because of risk factors for coronary disease evaluation for significant coronary artery disease will be reasonable I wanted to do stress test on her but she said absolutely not she did have a stress test done years ago she had horrible experience.  Therefore we will talk about potential doing coronary CT angio and she agreed for it.  She will be scheduled to have coronary CT angio to evaluate coronary artery disease. 2. Mixed dyslipidemia: I do have her LDL of 151 HDL 53 this is from 10/18/2020.  He is not on any statin she thinks that cholesterol is perfect.  However in my opinion is not and in the future we will continue discussion about potentially initiation some statin therapy which 1" the dose depends on coronary CT angio. 3. History of diastolic congestive heart failure is no swelling of lower extremities there is no JVD there is no hepatomegaly, however, she describes episodes of polyuria during the night and lack of fever during the day.  Therefore I will ask her to have echocardiogram to assess left ventricle ejection fraction.   Medication Adjustments/Labs and Tests Ordered: Current medicines are reviewed  at length with the patient today.  Concerns regarding medicines are outlined above.  Orders Placed This Encounter  Procedures  . EKG 12-Lead   No orders of the defined  types were placed in this encounter.   Signed, Park Liter, MD, [facility]. 12/18/2020 3:27 PM    [place]

## 2020-12-18 NOTE — Patient Instructions (Addendum)
Medication Instructions:  Your physician has recommended you make the following change in your medication:   1) Start Aspirin 81 mg, 1 tablet by mouth once a day  *If you need a refill on your cardiac medications before your next appointment, please call your pharmacy*  Lab Work: None ordered today  Testing/Procedures: Your physician has requested that you have an echocardiogram. Echocardiography is a painless test that uses sound waves to create images of your heart. It provides your doctor with information about the size and shape of your heart and how well your heart's chambers and valves are working. This procedure takes approximately one hour. There are no restrictions for this procedure.  Your cardiac CT will be scheduled at one of the below locations:   [facility] [address] [place], [place] [address] [phone]  [place] [address] [place], [place] [address] [phone]  If scheduled at [facility], please arrive at the [facility] main entrance of [facility] 30 minutes prior to test start time. Proceed to the [facility] Radiology Department (first floor) to check-in and test prep.  If scheduled at [facility], please arrive 15 mins early for check-in and test prep.  Please follow these instructions carefully (unless otherwise directed):  On the Night Before the Test: . Be sure to Drink plenty of water. . Do not consume any caffeinated/decaffeinated beverages or chocolate 12 hours prior to your test. . Do not take any antihistamines 12 hours prior to your test.  On the Day of the Test: . Drink plenty of water. Do not drink any water within one hour of the test. . Do not eat any food 4 hours prior to the test. . You may take your regular medications prior to the test.  . Take metoprolol (Lopressor) two hours prior to test. . HOLD Furosemide the  morning of the test. . FEMALES- please wear underwire-free bra if available  After the Test: . Drink plenty of water. . After receiving IV contrast, you may experience a mild flushed feeling. This is normal. . On occasion, you may experience a mild rash up to 24 hours after the test. This is not dangerous. If this occurs, you can take Benadryl 25 mg and increase your fluid intake. . If you experience trouble breathing, this can be serious. If it is severe call 911 IMMEDIATELY. If it is mild, please call our office.  Once we have confirmed authorization from your insurance company, we will call you to set up a date and time for your test. Based on how quickly your insurance processes prior authorizations requests, please allow up to 4 weeks to be contacted for scheduling your Cardiac CT appointment. Be advised that routine Cardiac CT appointments could be scheduled as many as 8 weeks after your provider has ordered it.  For non-scheduling related questions, please contact the cardiac imaging nurse navigator should you have any questions/concerns: Marchia Bond, Cardiac Imaging Nurse Navigator Burley Saver, Interim Cardiac Imaging Nurse [facility] [facility] Direct Office Dial: [phone]   For scheduling needs, including cancellations and rescheduling, please call [place], [phone].  Follow-Up: At [facility], you and your health needs are our priority.  As part of our continuing mission to provide you with exceptional heart care, we have created designated Provider Care Teams.  These Care Teams include your primary Cardiologist (physician) and Advanced Practice Providers (APPs -  Physician  Assistants and Nurse Practitioners) who all work together to provide you with the care you need, when you need it.  Your next appointment:   2 month(s)  The format for your next appointment:   In Person  Provider:   Jenne Campus, MD

## 2021-01-02 ENCOUNTER — Inpatient Hospital Stay: Payer: Medicare HMO

## 2021-01-02 ENCOUNTER — Inpatient Hospital Stay (HOSPITAL_BASED_OUTPATIENT_CLINIC_OR_DEPARTMENT_OTHER): Payer: Medicare HMO | Admitting: Family

## 2021-01-02 ENCOUNTER — Telehealth: Payer: Self-pay

## 2021-01-02 ENCOUNTER — Encounter: Payer: Self-pay | Admitting: Family

## 2021-01-02 ENCOUNTER — Other Ambulatory Visit: Payer: Self-pay

## 2021-01-02 VITALS — BP 146/92 | HR 79 | Temp 97.8°F | Resp 18 | Ht 62.0 in | Wt 175.4 lb

## 2021-01-02 DIAGNOSIS — E538 Deficiency of other specified B group vitamins: Secondary | ICD-10-CM

## 2021-01-02 DIAGNOSIS — D696 Thrombocytopenia, unspecified: Secondary | ICD-10-CM

## 2021-01-02 LAB — CBC WITH DIFFERENTIAL (CANCER CENTER ONLY)
Abs Immature Granulocytes: 0.02 10*3/uL (ref 0.00–0.07)
Basophils Absolute: 0.1 10*3/uL (ref 0.0–0.1)
Basophils Relative: 1 %
Eosinophils Absolute: 0.2 10*3/uL (ref 0.0–0.5)
Eosinophils Relative: 3 %
HCT: 41.5 % (ref 36.0–46.0)
Hemoglobin: 13.4 g/dL (ref 12.0–15.0)
Immature Granulocytes: 0 %
Lymphocytes Relative: 26 %
Lymphs Abs: 1.6 10*3/uL (ref 0.7–4.0)
MCH: 30.5 pg (ref 26.0–34.0)
MCHC: 32.3 g/dL (ref 30.0–36.0)
MCV: 94.3 fL (ref 80.0–100.0)
Monocytes Absolute: 0.4 10*3/uL (ref 0.1–1.0)
Monocytes Relative: 7 %
Neutro Abs: 3.7 10*3/uL (ref 1.7–7.7)
Neutrophils Relative %: 63 %
Platelet Count: 69 10*3/uL — ABNORMAL LOW (ref 150–400)
RBC: 4.4 MIL/uL (ref 3.87–5.11)
RDW: 12.8 % (ref 11.5–15.5)
WBC Count: 6 10*3/uL (ref 4.0–10.5)
nRBC: 0 % (ref 0.0–0.2)

## 2021-01-02 LAB — VITAMIN B12: Vitamin B-12: 411 pg/mL (ref 180–914)

## 2021-01-02 LAB — CMP (CANCER CENTER ONLY)
ALT: 21 U/L (ref 0–44)
AST: 19 U/L (ref 15–41)
Albumin: 4.3 g/dL (ref 3.5–5.0)
Alkaline Phosphatase: 88 U/L (ref 38–126)
Anion gap: 7 (ref 5–15)
BUN: 17 mg/dL (ref 8–23)
CO2: 28 mmol/L (ref 22–32)
Calcium: 9.6 mg/dL (ref 8.9–10.3)
Chloride: 107 mmol/L (ref 98–111)
Creatinine: 1.02 mg/dL — ABNORMAL HIGH (ref 0.44–1.00)
GFR, Estimated: >60 mL/min (ref >60)
Glucose, Bld: 119 mg/dL — ABNORMAL HIGH (ref 70–99)
Potassium: 4.1 mmol/L (ref 3.5–5.1)
Sodium: 142 mmol/L (ref 135–145)
Total Bilirubin: 0.3 mg/dL (ref 0.3–1.2)
Total Protein: 6.8 g/dL (ref 6.5–8.1)

## 2021-01-02 LAB — PLATELET BY CITRATE

## 2021-01-02 MED ORDER — CYANOCOBALAMIN 1000 MCG/ML IJ SOLN
1000.0000 ug | Freq: Once | INTRAMUSCULAR | Status: AC
  Administered 2021-01-02: 1000 ug via INTRAMUSCULAR

## 2021-01-02 MED ORDER — CYANOCOBALAMIN 1000 MCG/ML IJ SOLN
INTRAMUSCULAR | Status: AC
  Filled 2021-01-02: qty 1

## 2021-01-02 NOTE — Progress Notes (Signed)
Hematology and Oncology Follow Up Visit  Ebony Garcia [id number] 26-Aug-1951 70 y.o. 01/02/2021   Principle Diagnosis:  Medication induced thrombocytopenia B 12 deficiency  Current Therapy: Observation B 12 injection monthly   Interim History:  Ebony Garcia is here today for follow-up and B 12 injection. She notes fatigue.  She has occasional episodes of chest pain and recently saw cardiology. As part of her work up she is scheduled for CT coronary fractional flow reserve fluid analysis and ECHO in in February.  No fever, chills, n/v, rash, dizziness, SOB, palpitations, abdominal pain or changes in bowel habits.  She had been having issues emptying her bladder but cancelled her urology appointment when she started feeling better. She is noting again occasionally at night and will reschedule if persists/worsens.  No swelling in her extremities at this time.  She has tingling and burning at times in her fingertips and feet. This comes and goes.  No falls or syncope to report at this time.  She states that she eats one large meal a day and is staying well hydrated. Her weight is stable at 175 lbs.   ECOG Performance Status: 1 - Symptomatic but completely ambulatory  Medications:  Allergies as of 01/02/2021      Reactions   Antihistamines, Diphenhydramine-type Other (See Comments)   Other Reaction: makes pt nervous   Diphenhydramine Hcl Other (See Comments)   Other Reaction: makes pt nervous   Fremanezumab-vfrm Rash, Other (See Comments)   Seizure, jerking   Clindamycin Other (See Comments)   She has not taken but per patient  "her mother had a reaction and she does not want it ever"   Hydrocodone-acetaminophen Other (See Comments)   REACTION: sensitive to  but can take for extreme pain per patient   Lisinopril    Restless , irregular sleep   Nortriptyline Other (See Comments)   UNKNOWN REACTION   Propoxyphene N-acetaminophen Nausea Only   Sulfa Antibiotics  Other (See Comments)   Unknown reaction   Tramadol Other (See Comments)   UNKNOWN REACTION      Medication List       Accurate as of January 02, 2021  3:00 PM. If you have any questions, ask your nurse or doctor.        ALPRAZolam 1 MG tablet Commonly known as: XANAX TAKE 1 TABLET BY MOUTH EVERY DAY AT BEDTIME AS NEEDED FOR SLEEP   aspirin EC 81 MG tablet Take 1 tablet (81 mg total) by mouth daily. Swallow whole.   FLUoxetine 20 MG capsule Commonly known as: PROZAC Take 1 capsule (20 mg total) by mouth daily.   furosemide 20 MG tablet Commonly known as: LASIX TAKE 1 TABLET EVERY DAY   losartan 50 MG tablet Commonly known as: COZAAR Take 1 tablet (50 mg total) by mouth daily.   metoprolol tartrate 100 MG tablet Commonly known as: LOPRESSOR Take 1 tablet by mouth 2 hours prior to CT scan   nitroGLYCERIN 0.4 MG SL tablet Commonly known as: NITROSTAT Place 1 tablet (0.4 mg total) under the tongue every 5 (five) minutes as needed for chest pain.   promethazine 25 MG tablet Commonly known as: PHENERGAN TAKE ONE TABLET BY MOUTH EVERY 6 HOURS AS NEEDED FOR NAUSEA AND VOMITING   topiramate 100 MG tablet Commonly known as: TOPAMAX TAKE 1 TABLET TWICE DAILY   triamcinolone ointment 0.1 % Commonly known as: KENALOG Apply topically as needed       Allergies:  Allergies  Allergen Reactions  .  Antihistamines, Diphenhydramine-Type Other (See Comments)    Other Reaction: makes pt nervous  . Diphenhydramine Hcl Other (See Comments)    Other Reaction: makes pt nervous  . Fremanezumab-Vfrm Rash and Other (See Comments)    Seizure, jerking  . Clindamycin Other (See Comments)    She has not taken but per patient  "her mother had a reaction and she does not want it ever"  . Hydrocodone-Acetaminophen Other (See Comments)    REACTION: sensitive to  but can take for extreme pain per patient  . Lisinopril     Restless , irregular sleep  . Nortriptyline Other (See Comments)     UNKNOWN REACTION  . Propoxyphene N-Acetaminophen Nausea Only  . Sulfa Antibiotics Other (See Comments)    Unknown reaction  . Tramadol Other (See Comments)    UNKNOWN REACTION    Past Medical History, Surgical history, Social history, and Family History were reviewed and updated.  Review of Systems: All other 10 point review of systems is negative.   Physical Exam:  vitals were not taken for this visit.   Wt Readings from Last 3 Encounters:  12/18/20 172 lb (78 kg)  10/18/20 168 lb (76.2 kg)  10/02/20 168 lb (76.2 kg)    Ocular: Sclerae unicteric, pupils equal, round and reactive to light Ear-nose-throat: Oropharynx clear, dentition fair Lymphatic: No cervical or supraclavicular adenopathy Lungs no rales or rhonchi, good excursion bilaterally Heart regular rate and rhythm, no murmur appreciated Abd soft, nontender, positive bowel sounds MSK no focal spinal tenderness, no joint edema Neuro: non-focal, well-oriented, appropriate affect Breasts: Deferred   Lab Results  Component Value Date   WBC 6.0 01/02/2021   HGB 13.4 01/02/2021   HCT 41.5 01/02/2021   MCV 94.3 01/02/2021   PLT 69 (L) 01/02/2021   No results found for: FERRITIN, IRON, TIBC, UIBC, IRONPCTSAT Lab Results  Component Value Date   RBC 4.40 01/02/2021   No results found for: KPAFRELGTCHN, LAMBDASER, KAPLAMBRATIO No results found for: IGGSERUM, IGA, IGMSERUM No results found for: Odetta Pink, SPEI   Chemistry      Component Value Date/Time   NA 141 11/11/2020 1931   NA 143 11/11/2017 1339   K 3.4 (L) 11/11/2020 1931   K 3.6 11/11/2017 1339   CL 108 11/11/2020 1931   CL 109 (H) 11/11/2017 1339   CO2 23 11/11/2020 1931   CO2 24 11/11/2017 1339   BUN 11 11/11/2020 1931   BUN 9 11/11/2017 1339   CREATININE 0.97 11/11/2020 1931   CREATININE 1.00 (H) 10/18/2020 1433      Component Value Date/Time   CALCIUM 9.2 11/11/2020 1931   CALCIUM 9.3  11/11/2017 1339   ALKPHOS 68 10/02/2020 1349   ALKPHOS 85 (H) 11/11/2017 1339   AST 17 10/18/2020 1433   AST 17 10/02/2020 1349   ALT 18 10/18/2020 1433   ALT 16 10/02/2020 1349   ALT 24 11/11/2017 1339   BILITOT 0.4 10/18/2020 1433   BILITOT 0.3 10/02/2020 1349       Impression and Plan: Ebony Garcia is a pleasant 70yo caucasian female with thrombocytopenia and B12 deficiency. Platelets 69, Hgb 13.4, MCV 94 and WBC count 6.0.  She received her B 12 injection today.  Monthly B 12 with follow-up in 4 months.  She can contact our office with any questions or concerns.   Laverna Peace, NP 1/27/20223:00 PM

## 2021-01-02 NOTE — Patient Instructions (Signed)

## 2021-01-02 NOTE — Telephone Encounter (Signed)
appts made per 01/02/21 los and pt to view on my chart   Ebony Garcia

## 2021-01-06 ENCOUNTER — Other Ambulatory Visit: Payer: Self-pay | Admitting: Family Medicine

## 2021-01-06 DIAGNOSIS — F419 Anxiety disorder, unspecified: Secondary | ICD-10-CM

## 2021-01-06 NOTE — Telephone Encounter (Signed)
Requesting: alprazolam 1mg  Contract: 10/18/2020 UDS: 10/18/2020 Last Visit: 10/18/2020 Next Visit: 04/17/21 Last Refill: 11/25/2020 #30 and 0RF  Please Advise

## 2021-01-10 ENCOUNTER — Encounter: Payer: Self-pay | Admitting: Family Medicine

## 2021-01-10 ENCOUNTER — Ambulatory Visit (HOSPITAL_BASED_OUTPATIENT_CLINIC_OR_DEPARTMENT_OTHER)
Admission: RE | Admit: 2021-01-10 | Discharge: 2021-01-10 | Disposition: A | Discharge status: Home / Self Care | Payer: Medicare HMO | Source: Ambulatory Visit | Attending: Cardiology | Admitting: Cardiology

## 2021-01-10 ENCOUNTER — Other Ambulatory Visit: Payer: Self-pay

## 2021-01-10 DIAGNOSIS — R0602 Shortness of breath: Secondary | ICD-10-CM | POA: Diagnosis not present

## 2021-01-10 LAB — ECHOCARDIOGRAM COMPLETE
Area-P 1/2: 3.63 cm2
S' Lateral: 2.84 cm

## 2021-01-25 IMAGING — DX DG CHEST 2V
2 series · 2 of 2 positions shown · non-contrast
Comparison: Prior chest radiographs 11/03/2016 and earlier.

CLINICAL DATA: Chest pain. Additional history provided: History of
CHF.

EXAM:
CHEST - 2 VIEW

[chest pa]
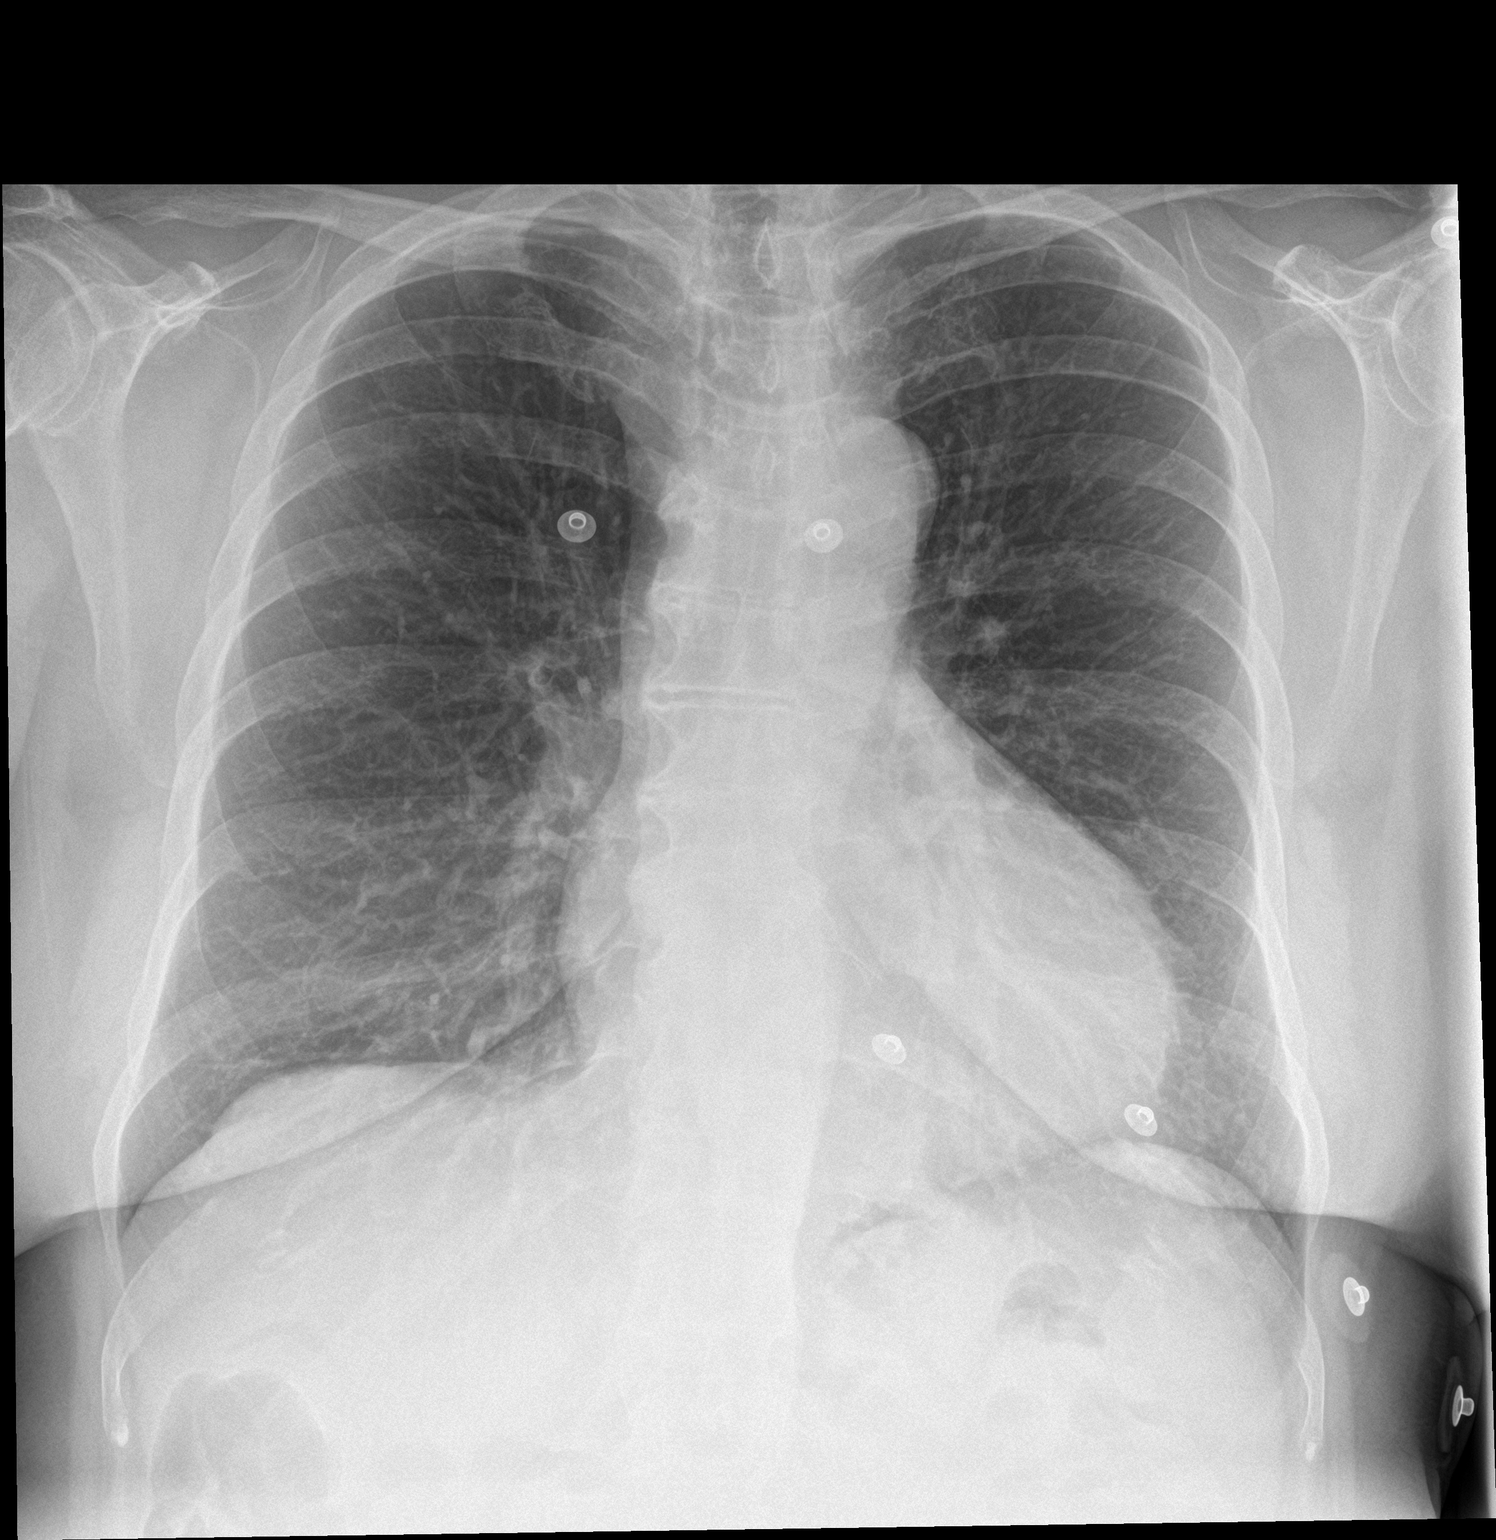

[chest lat]
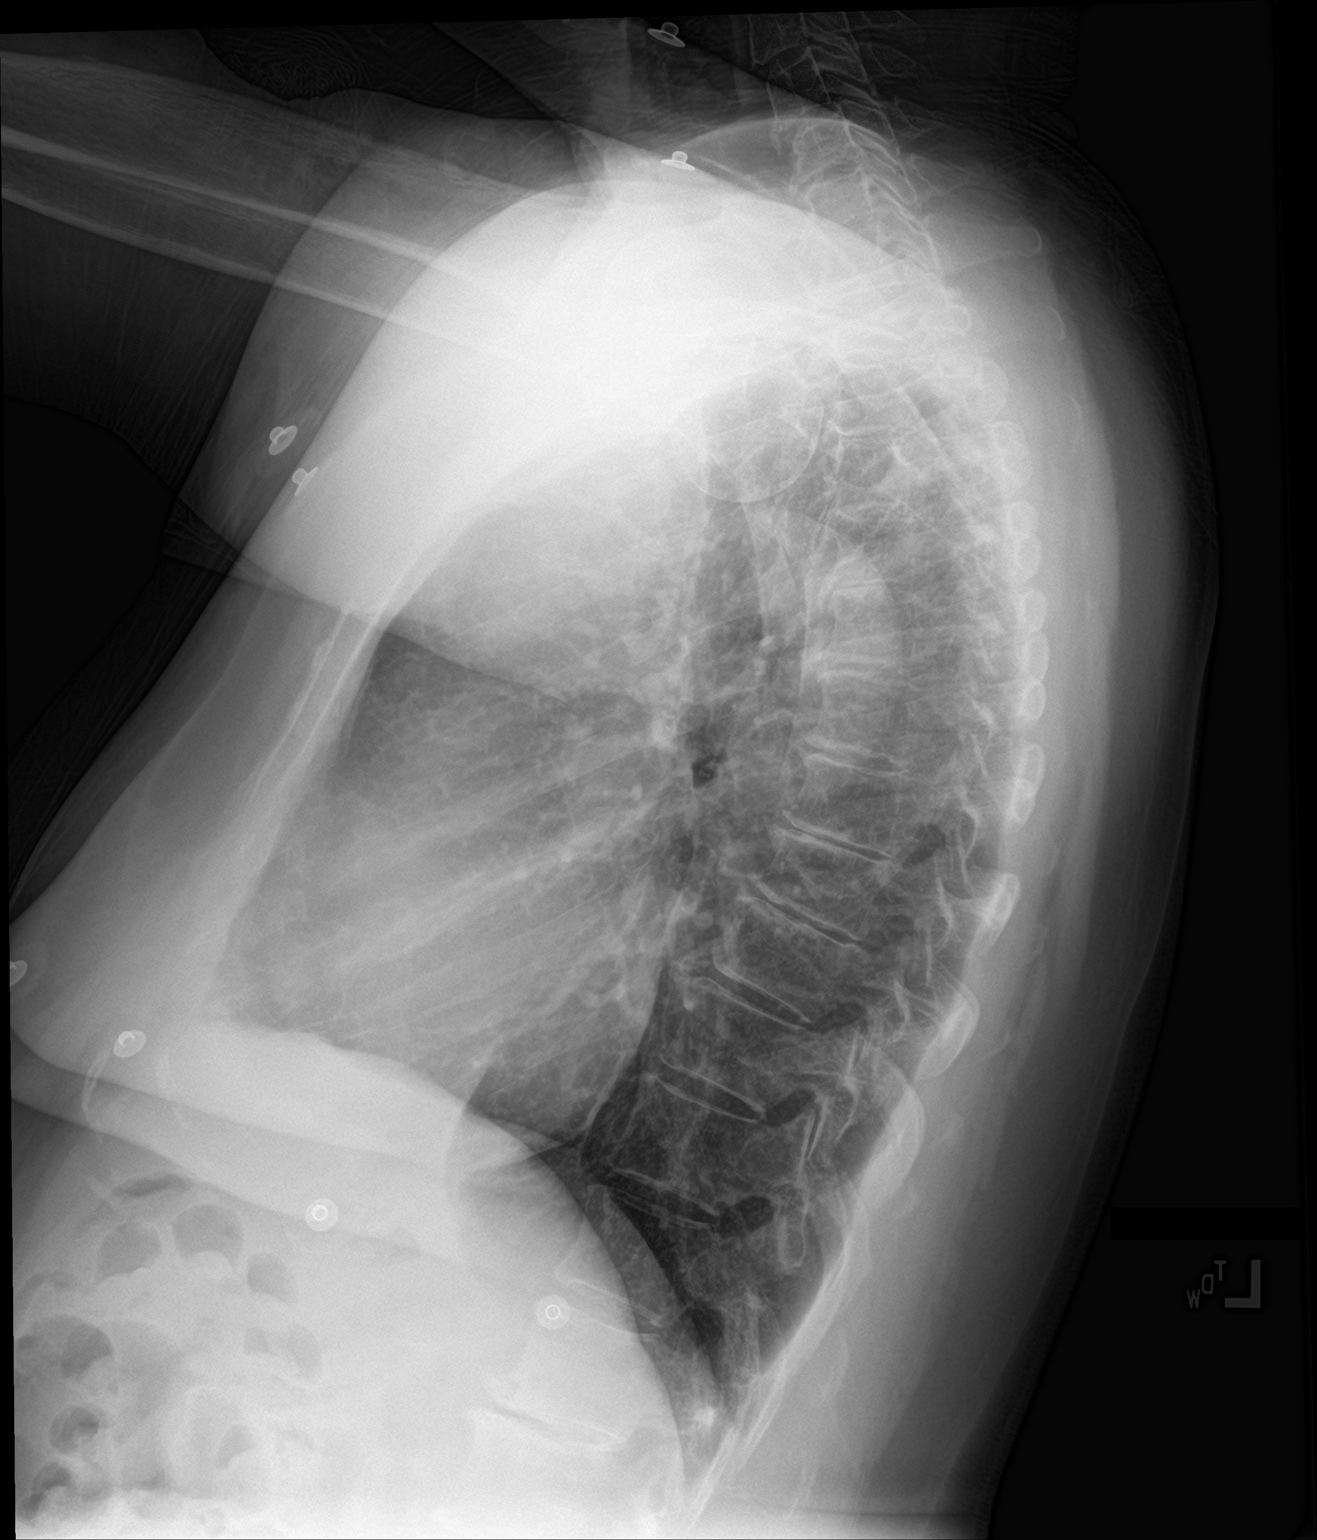

[2 of 2 positions shown; findings below may reference images not displayed]

FINDINGS: Heart size at the upper limits of normal, unchanged. No appreciable
airspace consolidation or pulmonary edema. No evidence of pleural
effusion or pneumothorax. No acute bony abnormality identified.
Thoracic spondylosis.
IMPRESSION: No evidence of acute cardiopulmonary abnormality.

## 2021-01-29 ENCOUNTER — Other Ambulatory Visit: Payer: Self-pay | Admitting: Family Medicine

## 2021-01-29 DIAGNOSIS — I5189 Other ill-defined heart diseases: Secondary | ICD-10-CM

## 2021-01-29 DIAGNOSIS — I10 Essential (primary) hypertension: Secondary | ICD-10-CM

## 2021-01-30 ENCOUNTER — Other Ambulatory Visit: Payer: Self-pay

## 2021-01-30 ENCOUNTER — Inpatient Hospital Stay: Payer: Medicare HMO | Attending: Hematology & Oncology

## 2021-01-30 VITALS — BP 133/81 | HR 73 | Temp 98.9°F | Resp 18

## 2021-01-30 DIAGNOSIS — E538 Deficiency of other specified B group vitamins: Secondary | ICD-10-CM | POA: Diagnosis not present

## 2021-01-30 MED ORDER — CYANOCOBALAMIN 1000 MCG/ML IJ SOLN
INTRAMUSCULAR | Status: AC
  Filled 2021-01-30: qty 1

## 2021-01-30 MED ORDER — CYANOCOBALAMIN 1000 MCG/ML IJ SOLN
1000.0000 ug | Freq: Once | INTRAMUSCULAR | Status: AC
  Administered 2021-01-30: 1000 ug via INTRAMUSCULAR

## 2021-01-30 NOTE — Patient Instructions (Signed)

## 2021-01-30 NOTE — Progress Notes (Signed)
Pt discharged in no apparent distress. Pt left ambulatory without assistance. Pt aware of discharge instructions and verbalized understanding and had no further questions.  

## 2021-02-14 NOTE — Progress Notes (Signed)
NEUROLOGY FOLLOW UP OFFICE NOTE  Randilyn T [place] [id number]  Assessment/Plan:   1.  Migraine without aura, without status migrainosus, not intractable 2.  Daily morning headaches 3.  Daytime fatigue  1.  Topiramate 100mg  twice daily.   2.  Limit use of pain relievers to no more than 2 days out of week to prevent risk of rebound or medication-overuse headache. 3.  Keep headache diary 4.  Will again order sleep study to evaluate for OSA 5.  She reports longstanding flank pain and urinary retention since November.  She is concerned about a kidney stone.  Advised to stay hydrated and follow up with PCP for further evaluation.  6.  Follow up 6 months.  Subjective:  Celesta Funderburk a 70 year old right-handed woman with hypertension, CHF, B12 deficiency, depression and hyperlipidemia whofollows up for migraines.  UPDATE She wakes up with a headache every morning but it resolves after her cup caffeine.  Fatigued during the day.  Attributes it to depression.  She feels isolated.  She stays in house. However, migraines are now infrequent, once in every 2-3 months  Reports flank pain and urinary retention.  Afraid that she has a kidney stone. Current NSAIDS:no Current analgesics: no. Current triptans:none Current anti-emetic: Phenergan25mg  Current muscle relaxants: no Current anti-anxiolytic: Xanax 1mg  Current sleep aide: Xanax 1mg  Current Antihypertensive medications: losartan, Lasix Current Antidepressant medications: Prozac 20mg  Current Anticonvulsant medications: topiramate 100mg  BID Current anti-CGRP: none  Depression and anxiety: Yes She reports off and on body aches, paresthesias, head pains. Reports drinking 64 oz water daily Sleep:  She says she sleeps well.  However, daytime fatigue.  Ordered sleep study.  She never received a call to schedule.  HISTORY: Onset: Since her late 42s. Location: usually left sided, periorbital, and back of  head.More recently, holocephalic Quality:Sharp, aching pressure.Non-throbbing Initial intensity:7/10; Sept: 7/10 Associated symptoms: Nausea,photophobia and phonophobia.No visual disturbance, osmophobia,vomitingor unilateral numbness or weakness Aura:no Initial duration:3 days; Sept: 30 minutes with Maxalt Initial frequency:25 headache days per month Triggers: Emotional stress Relieving factors: Cold mask, heating pack on neck and shoulders  Past NSAIDs: naproxen (ineffective), indomethacin, toradol tablet (ineffective), Toradol 60mg  IM (effective), Cambia (side effects) Past analgesics: Tylenol (ineffective), Excedrin (ineffective), Lidocaine nasal drops (effective but caused hives), tramadol (side effects), Midrin (effective but made her sleepy) Past triptans/ergots: sumatriptan 100mg , Zomig 5mg  (effective), Frova, Relpax, DHE (effective), Maxalt (effective but unable to afford it),Tosymra NS, Zomig 5mg  NS Past antiemetic: Reglan Past anxiolytics: Vicodin Past muscle relaxants: cyclobenzaprine Past antihypertensives: propranolol (ineffective), atenolol (ineffective), lisinopril, metoprolol, HCTZ Past antidepressants: amitriptyline (side effects), venlafaxine (ineffective), nortriptyline (effective but side effects such as rash), imipramine Past antiepileptics: Depakote (hair loss), lamotrigine (side effects), zonisamide (side effects), gabapentin, Keppra Past CGRP inhibitor: Aimovig (effective but concerned about side effects- ankle jerking, abnormal skin pattern), Ajovy (same side effects); Emgality (cost) Past vitamins and supplements: magnesium (ineffective), feverfew (ineffective) Other past therapy: Botox (one round, flu like symptoms, unable to afford 20% copay), occipital nerve blocks, trigger point injections, alternating heat/ice  Family history of headache:Mother, grandmother, great-aunt, cousins.  08/16/08 MRI Brain w/wo performed for  "explosive headaches": nonspecific punctate hyperintensities in the subcortical white matter. 08/16/08 MRA Head: Unremarkable.There is mild attenuation in the vertebrobasilar junction and proximal basilar artery which is likely artifact. 07/05/19 CTA of Head:  Unremarkable for mass lesion, high-grade arterial stenosis, aneurysm or other acute intracranial abnormality.  PAST MEDICAL HISTORY: Past Medical History:  Diagnosis Date  . Acute diastolic CHF (congestive heart failure) ([place]) 04/30/2013  2/72-53/66 acute diastolic heart failure  BNP 1528 2 D echocardiogram essentially negative  Chest x-ray revealed cardiomegaly with costophrenic angle blunting and suggestion of possible interstitial vascular accentuation; no frank heart failure present. 10 pound weight loss with 20 mg of furosemide daily  No definite etiology of the cardiomegaly or acute diastolic failure   . Anxiety 02/07/2015  . Auditory hallucination 10/06/2017  . B12 deficiency 10/07/2018  . Bradycardia 04/30/2013  . Cancer ([place])   . Chest pain 05/14/2020  . Chest pain with moderate risk of acute coronary syndrome 02/07/2015  . CHF (congestive heart failure) ([place])    dystolic heart dysfunction  . Chronic kidney disease   . De Quervain's tenosynovitis, left 05/23/2013  . Depression   . Depression with anxiety   . Depressive disorder, not elsewhere classified 05/31/2007   Centricity Description: DISORDER, DEPRESSIVE NEC Qualifier: Diagnosis of  By: Linna Darner MD, [place] Oct[place], [place]   . Diabetes mellitus without complication ([place])    Diet controlled  . Dysmetabolic syndrome X 4/40/3474   Qualifier: Diagnosis of  By: Linna Darner MD, [place]  A1c 6.3% in 2/12; TG 278   . Essential hypertension 08/09/2008   Qualifier: Diagnosis of  By: Linna Darner MD, Gwyndolyn Saxon    . Estrogen deficiency 02/02/2019  . Estrogen deficiency 02/02/2019  . Fatigue 02/02/2019  . Hair loss 02/02/2019  . Hyperlipidemia 02/27/2008   Qualifier:  Diagnosis of  By: Linna Darner MD, Gwyndolyn Saxon    . Hypertension   . Intertrigo   . INTERTRIGO 11/08/2009   Qualifier: Diagnosis of  By: Inda Castle FNP, [place]   . Intractable migraine with aura without status migrainosus 08/03/2018  . Migraine variant 08/15/2008   Qualifier: Diagnosis of  By: Linna Darner MD, Gwyndolyn Saxon   Onset:initially @ age 60;recurrence age 11 during 1st trimester. Recurrence @ 39. No prodrome or aura Previously on Tryptans & Topamax with temporary relief but tolerance appeared Botox by Dr Leward Quan 09/2010  most effective    . Migraines   . Myalgia 02/02/2019  . NONSPECIFIC ABNORMAL ELECTROCARDIOGRAM 08/15/2008   Qualifier: Diagnosis of  By: Linna Darner MD, Gwyndolyn Saxon    . Palpitation 04/17/2014  . Panic attacks   . Panic attacks   . Polydipsia 11/02/2007   Qualifier: Diagnosis of  By: Linna Darner MD, [place]    . RENAL CALCULUS 02/21/2007   Qualifier: Diagnosis of  By: Allen Norris    . Squamous cell carcinoma in situ of skin 12/26/2015  . Thrombocytopenia ([place]) 10/06/2017  . Viral syndrome 04/10/2019  . WEIGHT GAIN 11/02/2007   Qualifier: Diagnosis of  By: Linna Darner MD, Gwyndolyn Saxon      MEDICATIONS: Current Outpatient Medications on File Prior to Visit  Medication Sig Dispense Refill  . ALPRAZolam (XANAX) 1 MG tablet TAKE 1 TABLET BY MOUTH EVERY DAY AT BEDTIME AS NEEDED FOR SLEEP 30 tablet 0  . aspirin EC 81 MG tablet Take 1 tablet (81 mg total) by mouth daily. Swallow whole. 30 tablet 11  . FLUoxetine (PROZAC) 20 MG capsule Take 1 capsule (20 mg total) by mouth daily. 90 capsule 1  . furosemide (LASIX) 20 MG tablet Take 1 tablet (20 mg total) by mouth daily. 90 tablet 1  . losartan (COZAAR) 50 MG tablet Take 1 tablet (50 mg total) by mouth daily. 90 tablet 1  . metoprolol tartrate (LOPRESSOR) 100 MG tablet Take 1 tablet by mouth 2 hours prior to CT scan (Patient not taking: Reported on 01/02/2021) 1 tablet 0  . nitroGLYCERIN (NITROSTAT)  0.4 MG SL tablet Place 1 tablet (0.4 mg total) under the tongue  every 5 (five) minutes as needed for chest pain. (Patient not taking: Reported on 01/02/2021) 25 tablet 3  . promethazine (PHENERGAN) 25 MG tablet TAKE ONE TABLET BY MOUTH EVERY 6 HOURS AS NEEDED FOR NAUSEA AND VOMITING 30 tablet 2  . topiramate (TOPAMAX) 100 MG tablet TAKE 1 TABLET TWICE DAILY 180 tablet 1  . triamcinolone ointment (KENALOG) 0.1 % Apply topically as needed     No current facility-administered medications on file prior to visit.    ALLERGIES: Allergies  Allergen Reactions  . Antihistamines, Diphenhydramine-Type Other (See Comments)    Other Reaction: makes pt nervous  . Diphenhydramine Hcl Other (See Comments)    Other Reaction: makes pt nervous  . Fremanezumab-Vfrm Rash and Other (See Comments)    Seizure, jerking  . Clindamycin Other (See Comments)    She has not taken but per patient  "her mother had a reaction and she does not want it ever"  . Hydrocodone-Acetaminophen Other (See Comments)    REACTION: sensitive to  but can take for extreme pain per patient  . Lisinopril     Restless , irregular sleep  . Nortriptyline Other (See Comments)    UNKNOWN REACTION  . Propoxyphene N-Acetaminophen Nausea Only  . Sulfa Antibiotics Other (See Comments)    Unknown reaction  . Tramadol Other (See Comments)    UNKNOWN REACTION    FAMILY HISTORY: Family History  Problem Relation Age of Onset  . Crohn's disease Mother   . Atrial fibrillation Mother   . Heart failure Mother   . Bladder Cancer Father   . Cancer Father        bladder  . Bipolar disorder Brother   . Cancer Brother   . Bipolar disorder Sister   . Heart attack Maternal Grandmother        >65  . Cancer Maternal Aunt   . Colon cancer Maternal Uncle   . Cancer Paternal Grandfather   . Cancer Maternal Aunt   . Cancer Cousin   . Anemia Cousin   . Diabetes Neg Hx   . Stroke Neg Hx       Objective:  Blood pressure 110/70, pulse 78, resp. rate 18, height 5\' 2"  (1.575 m), weight 176 lb (79.8 kg), SpO2  95 %. General: No acute distress.  Patient appears well-groomed.       Metta Clines, DO  CC: Roma Schanz, DO

## 2021-02-17 ENCOUNTER — Other Ambulatory Visit: Payer: Self-pay

## 2021-02-17 ENCOUNTER — Ambulatory Visit (INDEPENDENT_AMBULATORY_CARE_PROVIDER_SITE_OTHER): Payer: Medicare HMO | Admitting: Neurology

## 2021-02-17 ENCOUNTER — Encounter: Payer: Self-pay | Admitting: Neurology

## 2021-02-17 ENCOUNTER — Other Ambulatory Visit: Payer: Self-pay | Admitting: Family Medicine

## 2021-02-17 VITALS — BP 110/70 | HR 78 | Resp 18 | Ht 62.0 in | Wt 176.0 lb

## 2021-02-17 DIAGNOSIS — R519 Headache, unspecified: Secondary | ICD-10-CM

## 2021-02-17 DIAGNOSIS — G43009 Migraine without aura, not intractable, without status migrainosus: Secondary | ICD-10-CM

## 2021-02-17 DIAGNOSIS — F419 Anxiety disorder, unspecified: Secondary | ICD-10-CM

## 2021-02-17 DIAGNOSIS — G4719 Other hypersomnia: Secondary | ICD-10-CM | POA: Diagnosis not present

## 2021-02-17 NOTE — Patient Instructions (Signed)
Continue topiramate.  Will order sleep study.  Contact me if you don't hear from anybody by Friday Follow up with Dr. Etter Sjogren regarding urinary pain and retention Follow up with me in 6 months.

## 2021-02-18 NOTE — Telephone Encounter (Signed)
Requesting: Alprazolam Contract: 11/24/17 UDS: 10/18/20 Last Visit: 10/18/2020 Next Visit: 04/17/2021 Last Refill: 01/06/2021  Please Advise

## 2021-02-19 ENCOUNTER — Ambulatory Visit: Payer: Medicare HMO | Admitting: Cardiology

## 2021-02-20 ENCOUNTER — Telehealth: Payer: Self-pay

## 2021-02-20 NOTE — Telephone Encounter (Signed)
Called and left a vm with a new appt for 5/18 as sarah is out of the office on thurs    Ebony Garcia

## 2021-02-24 ENCOUNTER — Ambulatory Visit (INDEPENDENT_AMBULATORY_CARE_PROVIDER_SITE_OTHER): Payer: Medicare HMO | Admitting: Family Medicine

## 2021-02-24 ENCOUNTER — Encounter: Payer: Self-pay | Admitting: Family Medicine

## 2021-02-24 ENCOUNTER — Other Ambulatory Visit: Payer: Self-pay

## 2021-02-24 VITALS — BP 118/98 | HR 89 | Temp 97.9°F | Resp 20 | Ht 62.0 in | Wt 174.8 lb

## 2021-02-24 DIAGNOSIS — R1030 Lower abdominal pain, unspecified: Secondary | ICD-10-CM | POA: Insufficient documentation

## 2021-02-24 DIAGNOSIS — R3129 Other microscopic hematuria: Secondary | ICD-10-CM | POA: Diagnosis not present

## 2021-02-24 DIAGNOSIS — M545 Low back pain, unspecified: Secondary | ICD-10-CM

## 2021-02-24 LAB — POC URINALSYSI DIPSTICK (AUTOMATED)
Bilirubin, UA: NEGATIVE
Blood, UA: POSITIVE
Glucose, UA: NEGATIVE
Ketones, UA: NEGATIVE
Nitrite, UA: NEGATIVE
Protein, UA: NEGATIVE
Spec Grav, UA: 1.015 (ref 1.010–1.025)
Urobilinogen, UA: 0.2 E.U./dL
pH, UA: 6 (ref 5.0–8.0)

## 2021-02-24 NOTE — Assessment & Plan Note (Signed)
With flank pain b/l  Pt was unable to void during ov Check [place] abd  Pt ? uti vs kidney stone

## 2021-02-24 NOTE — Patient Instructions (Signed)
Flank Pain, Adult Flank pain is pain that is located on the side of the body between the upper abdomen and the back. This area is called the flank. The pain may occur over a short period of time (acute), or it may be long-term or recurring (chronic). It may be mild or severe. Flank pain can be caused by many things, including:  Muscle soreness or injury.  Kidney stones or kidney disease.  Stress.  A disease of the spine (vertebral disk disease).  A lung infection (pneumonia).  Fluid around the lungs (pulmonary edema).  A skin rash caused by the chickenpox virus (shingles).  Tumors that affect the back of the abdomen.  Gallbladder disease. Follow these instructions at home:  Drink enough fluid to keep your urine clear or pale yellow.  Rest as told by your health care provider.  Take over-the-counter and prescription medicines only as told by your health care provider.  Keep a journal to track what has caused your flank pain and what has made it feel better.  Keep all follow-up visits as told by your health care provider. This is important.   Contact a health care provider if:  Your pain is not controlled with medicine.  You have new symptoms.  Your pain gets worse.  You have a fever.  Your symptoms last longer than 2-3 days.  You have trouble urinating or you are urinating very frequently. Get help right away if:  You have trouble breathing or you are short of breath.  Your abdomen hurts or it is swollen or red.  You have nausea or vomiting.  You feel faint or you pass out.  You have blood in your urine. Summary  Flank pain is pain that is located on the side of the body between the upper abdomen and the back.  The pain may occur over a short period of time (acute), or it may be long-term or recurring (chronic). It may be mild or severe.  Flank pain can be caused by many things.  Contact your health care provider if your symptoms get worse or they last  longer than 2-3 days. This information is not intended to replace advice given to you by your health care provider. Make sure you discuss any questions you have with your health care provider. Document Revised: 08/16/2020 Document Reviewed: 08/16/2020 Elsevier Patient Education  2021 [employer]  

## 2021-02-24 NOTE — Progress Notes (Signed)
Patient ID: Ebony Garcia, female    DOB: Feb 28, 1951  Age: 70 y.o. MRN: [id number]    Subjective:  Subjective  HPI Ebony Garcia presents for an office visit today. She complains of lower back pain and abdominal pain. She reports feeling constant pain in her abdomen, but coffee and lasix improves these symptoms. She denies any chest pain, SOB, fever, cough, chills, sore throat, dysuria, urinary incontinence, HA, or N/VD at this time.    Review of Systems  Constitutional: Negative for chills, fatigue and fever.  HENT: Negative for congestion, ear pain, sinus pain and sore throat.   Eyes: Negative for pain.  Respiratory: Negative for cough and shortness of breath.   Cardiovascular: Negative for chest pain, palpitations and leg swelling.  Gastrointestinal: Positive for abdominal pain. Negative for blood in stool, constipation, diarrhea, nausea and vomiting.  Genitourinary: Positive for frequency. Negative for dysuria, hematuria and urgency.  Musculoskeletal: Positive for back pain (Lower). Negative for myalgias.          Skin: Negative for rash.  Neurological: Negative for weakness and headaches.    History Past Medical History:  Diagnosis Date  . Acute diastolic CHF (congestive heart failure) ([place]) 04/30/2013   6/43-32/95 acute diastolic heart failure  BNP 1528 2 D echocardiogram essentially negative  Chest x-ray revealed cardiomegaly with costophrenic angle blunting and suggestion of possible interstitial vascular accentuation; no frank heart failure present. 10 pound weight loss with 20 mg of furosemide daily  No definite etiology of the cardiomegaly or acute diastolic failure   . Anxiety 02/07/2015  . Auditory hallucination 10/06/2017  . B12 deficiency 10/07/2018  . Bradycardia 04/30/2013  . Cancer ([place])   . Chest pain 05/14/2020  . Chest pain with moderate risk of acute coronary syndrome 02/07/2015  . CHF (congestive heart failure) ([place])    dystolic heart dysfunction  .  Chronic kidney disease   . De Quervain's tenosynovitis, left 05/23/2013  . Depression   . Depression with anxiety   . Depressive disorder, not elsewhere classified 05/31/2007   Centricity Description: DISORDER, DEPRESSIVE NEC Qualifier: Diagnosis of  By: Linna Darner MD, [place], [place]   . Diabetes mellitus without complication ([place])    Diet controlled  . Dysmetabolic syndrome X 1/88/4166   Qualifier: Diagnosis of  By: Linna Darner MD, [place]  A1c 6.3% in 2/12; TG 278   . Essential hypertension 08/09/2008   Qualifier: Diagnosis of  By: Linna Darner MD, Gwyndolyn Saxon    . Estrogen deficiency 02/02/2019  . Estrogen deficiency 02/02/2019  . Fatigue 02/02/2019  . Hair loss 02/02/2019  . Hyperlipidemia 02/27/2008   Qualifier: Diagnosis of  By: Linna Darner MD, Gwyndolyn Saxon    . Hypertension   . Intertrigo   . INTERTRIGO 11/08/2009   Qualifier: Diagnosis of  By: Inda Castle FNP, [place]   . Intractable migraine with aura without status migrainosus 08/03/2018  . Migraine variant 08/15/2008   Qualifier: Diagnosis of  By: Linna Darner MD, Gwyndolyn Saxon   Onset:initially @ age 40;recurrence age 71 during 1st trimester. Recurrence @ 39. No prodrome or aura Previously on Tryptans & Topamax with temporary relief but tolerance appeared Botox by Dr Leward Quan 09/2010  most effective    . Migraines   . Myalgia 02/02/2019  . NONSPECIFIC ABNORMAL ELECTROCARDIOGRAM 08/15/2008   Qualifier: Diagnosis of  By: Linna Darner MD, Gwyndolyn Saxon    . Palpitation 04/17/2014  . Panic attacks   . Panic attacks   . Polydipsia 11/02/2007   Qualifier: Diagnosis of  By: Linna Darner MD, [place]    . RENAL CALCULUS 02/21/2007   Qualifier: Diagnosis of  By: Allen Norris    . Squamous cell carcinoma in situ of skin 12/26/2015  . Thrombocytopenia ([place]) 10/06/2017  . Viral syndrome 04/10/2019  . WEIGHT GAIN 11/02/2007   Qualifier: Diagnosis of  By: Linna Darner MD, Gwyndolyn Saxon      She has a past surgical history that includes Total abdominal hysterectomy w/  bilateral salpingoophorectomy; Tonsillectomy and adenoidectomy; Nasal septum surgery; Tubal ligation; Dilation and curettage of uterus; papaloma (Right); Breast surgery; and Abdominal hysterectomy.   Her family history includes Anemia in her cousin; Atrial fibrillation in her mother; Bipolar disorder in her brother and sister; Bladder Cancer in her father; Cancer in her brother, cousin, father, maternal aunt, maternal aunt, and paternal grandfather; Colon cancer in her maternal uncle; Crohn's disease in her mother; Heart attack in her maternal grandmother; Heart failure in her mother.She reports that she has never smoked. She has never used smokeless tobacco. She reports that she does not drink alcohol and does not use drugs.  Current Outpatient Medications on File Prior to Visit  Medication Sig Dispense Refill  . ALPRAZolam (XANAX) 1 MG tablet TAKE 1 TABLET BY MOUTH EVERY DAY AT BEDTIME AS NEEDED FOR SLEEP 30 tablet 0  . aspirin EC 81 MG tablet Take 1 tablet (81 mg total) by mouth daily. Swallow whole. 30 tablet 11  . FLUoxetine (PROZAC) 20 MG capsule Take 1 capsule (20 mg total) by mouth daily. 90 capsule 1  . furosemide (LASIX) 20 MG tablet Take 1 tablet (20 mg total) by mouth daily. 90 tablet 1  . losartan (COZAAR) 50 MG tablet Take 1 tablet (50 mg total) by mouth daily. 90 tablet 1  . promethazine (PHENERGAN) 25 MG tablet TAKE ONE TABLET BY MOUTH EVERY 6 HOURS AS NEEDED FOR NAUSEA AND VOMITING 30 tablet 2  . topiramate (TOPAMAX) 100 MG tablet TAKE 1 TABLET TWICE DAILY 180 tablet 1  . triamcinolone ointment (KENALOG) 0.1 % Apply topically as needed     No current facility-administered medications on file prior to visit.     Objective:  Objective  Physical Exam Vitals and nursing note reviewed.  Constitutional:      General: She is not in acute distress.    Appearance: Normal appearance. She is well-developed. She is not ill-appearing.  HENT:     Head: Normocephalic and atraumatic.      Right Ear: External ear normal.     Left Ear: External ear normal.     Nose: Nose normal.  Eyes:     General:        Right eye: No discharge.        Left eye: No discharge.     Extraocular Movements: Extraocular movements intact.     Pupils: Pupils are equal, round, and reactive to light.  Cardiovascular:     Rate and Rhythm: Normal rate and regular rhythm.     Pulses: Normal pulses.     Heart sounds: Normal heart sounds. No murmur heard.   Pulmonary:     Effort: Pulmonary effort is normal. No respiratory distress.     Breath sounds: Normal breath sounds. No wheezing or rales.  Abdominal:     General: Abdomen is flat. Bowel sounds are normal. There is no distension.     Palpations: Abdomen is soft.     Tenderness: There is no abdominal tenderness. There is no right CVA tenderness, left CVA tenderness or guarding.  Hernia: No hernia is present.  Musculoskeletal:        General: Normal range of motion.     Cervical back: Normal range of motion and neck supple.     Comments: No bilateral flank pain  Skin:    General: Skin is warm and dry.  Neurological:     Mental Status: She is alert and oriented to person, place, and time.    BP (!) 118/98 (BP Location: Right Arm, Patient Position: Sitting, Cuff Size: Normal)   Pulse 89   Temp 97.9 F (36.6 C) (Oral)   Resp 20   Ht 5\' 2"  (1.575 m)   Wt 174 lb 12.8 oz (79.3 kg)   SpO2 98%   BMI 31.97 kg/m  Wt Readings from Last 3 Encounters:  02/24/21 174 lb 12.8 oz (79.3 kg)  02/17/21 176 lb (79.8 kg)  01/02/21 175 lb 6.4 oz (79.6 kg)     Lab Results  Component Value Date   WBC 6.0 01/02/2021   HGB 13.4 01/02/2021   HCT 41.5 01/02/2021   PLT 69 (L) 01/02/2021   GLUCOSE 119 (H) 01/02/2021   CHOL 240 (H) 10/18/2020   TRIG 216 (H) 10/18/2020   HDL 53 10/18/2020   LDLDIRECT 121.0 09/20/2017   LDLCALC 151 (H) 10/18/2020   ALT 21 01/02/2021   AST 19 01/02/2021   NA 142 01/02/2021   K 4.1 01/02/2021   CL 107 01/02/2021    CREATININE 1.02 (H) 01/02/2021   BUN 17 01/02/2021   CO2 28 01/02/2021   TSH 2.99 10/18/2020   INR 0.93 07/19/2010   HGBA1C 5.5 10/18/2020   MICROALBUR 1.1 09/20/2017    ECHOCARDIOGRAM COMPLETE  Result Date: 01/10/2021    ECHOCARDIOGRAM REPORT   Patient Name:   Ebony Garcia Date of Exam: 01/10/2021 Medical Rec #:  [id number]          Height:       62.0 in Accession #:    [id number]         Weight:       175.4 lb Date of Birth:  [birth year]         BSA:          1.808 m Patient Age:    40 years           BP:           132/78 mmHg Patient Gender: F                  HR:           84 bpm. Exam Location:  [facility] Procedure: 2D Echo, Cardiac Doppler and Color Doppler Indications:    R06.02 SOB  History:        Patient has no prior history of Echocardiogram examinations.                 Signs/Symptoms:Chest Pain and Shortness of Breath; Risk                 Factors:Dyslipidemia, Hypertension and Diabetes.  Sonographer:    Geradine Girt Referring Phys: [phone] [place] Comments: Technically difficult study due to poor echo windows. Image acquisition challenging due to patient body habitus. IMPRESSIONS  1. Left ventricular ejection fraction, by estimation, is 60 to 65%. The left ventricle has normal function. Left ventricular endocardial border not optimally defined to evaluate regional wall motion. Left ventricular diastolic parameters are consistent with Grade I diastolic dysfunction (impaired relaxation).  2. Right ventricular systolic function is normal. The right ventricular size is normal.  3. The mitral valve is normal in structure. No evidence of mitral valve regurgitation. No evidence of mitral stenosis.  4. The aortic valve is normal in structure. Aortic valve regurgitation is not visualized. No aortic stenosis is present.  5. There is mild dilatation of the ascending aorta, measuring 37 mm.  6. The inferior vena cava is normal in size with greater than 50% respiratory  variability, suggesting right atrial pressure of 3 mmHg. FINDINGS  Left Ventricle: Left ventricular ejection fraction, by estimation, is 60 to 65%. The left ventricle has normal function. Left ventricular endocardial border not optimally defined to evaluate regional wall motion. The left ventricular internal cavity size was normal in size. There is no left ventricular hypertrophy. Left ventricular diastolic parameters are consistent with Grade I diastolic dysfunction (impaired relaxation). Normal left ventricular filling pressure. Right Ventricle: The right ventricular size is normal. No increase in right ventricular wall thickness. Right ventricular systolic function is normal. Left Atrium: Left atrial size was normal in size. Right Atrium: Right atrial size was normal in size. Pericardium: There is no evidence of pericardial effusion. Mitral Valve: The mitral valve is normal in structure. No evidence of mitral valve regurgitation. No evidence of mitral valve stenosis. Tricuspid Valve: The tricuspid valve is normal in structure. Tricuspid valve regurgitation is not demonstrated. No evidence of tricuspid stenosis. Aortic Valve: The aortic valve is normal in structure. Aortic valve regurgitation is not visualized. No aortic stenosis is present. Pulmonic Valve: The pulmonic valve was normal in structure. Pulmonic valve regurgitation is not visualized. No evidence of pulmonic stenosis. Aorta: The aortic root is normal in size and structure. There is mild dilatation of the ascending aorta, measuring 37 mm. Venous: The pulmonary veins were not well visualized. The inferior vena cava is normal in size with greater than 50% respiratory variability, suggesting right atrial pressure of 3 mmHg. IAS/Shunts: No atrial level shunt detected by color flow Doppler.  LEFT VENTRICLE PLAX 2D LVIDd:         4.52 cm  Diastology LVIDs:         2.84 cm  LV e' medial:    4.90 cm/s LV PW:         0.84 cm  LV E/e' medial:  12.1 LV IVS:         1.14 cm  LV e' lateral:   9.46 cm/s LVOT diam:     2.00 cm  LV E/e' lateral: 6.2 LV SV:         47 LV SV Index:   26 LVOT Area:     3.14 cm  RIGHT VENTRICLE RV S prime:     10.90 cm/s TAPSE (M-mode): 2.0 cm LEFT ATRIUM           Index       RIGHT ATRIUM          Index LA diam:      3.20 cm 1.77 cm/m  RA Area:     8.99 cm LA Vol (A2C): 27.3 ml 15.10 ml/m RA Volume:   15.80 ml 8.74 ml/m LA Vol (A4C): 28.9 ml 15.98 ml/m  AORTIC VALVE LVOT Vmax:   79.00 cm/s LVOT Vmean:  56.000 cm/s LVOT VTI:    0.150 m  AORTA Ao Root diam: 2.70 cm Ao Asc diam:  3.70 cm MITRAL VALVE               TRICUSPID VALVE MV  Area (PHT): 3.63 cm    TR Peak grad:   16.2 mmHg MV Decel Time: 209 msec    TR Vmax:        201.00 cm/s MV E velocity: 59.10 cm/s MV A velocity: 57.40 cm/s  SHUNTS MV E/A ratio:  1.03        Systemic VTI:  0.15 m                            Systemic Diam: 2.00 cm Shirlee More MD Electronically signed by Shirlee More MD Signature Date/Time: 01/10/2021/3:45:02 PM    Final      Assessment & Plan:  Plan   No orders of the defined types were placed in this encounter.   Problem List Items Addressed This Visit      Unprioritized   Acute low back pain - Primary   Relevant Orders   POCT Urinalysis Dipstick (Automated) (Completed)   Lower abdominal pain    With flank pain b/l  Pt was unable to void during ov Check [place] abd  Pt ? uti vs kidney stone       Relevant Orders   US Abdomen Complete   US Pelvic Complete With Transvaginal      Follow-up: Return if symptoms worsen or fail to improve.   I,Gordon Zheng,acting as a Education administrator for [employer], DO.,have documented all relevant documentation on the behalf of [name], DO,as directed by  [name], DO while in the presence of [place], DO, have reviewed all documentation for this visit. The documentation on 02/24/21 for the exam, diagnosis, procedures, and orders are all accurate  and complete. [name], DO

## 2021-02-25 DIAGNOSIS — R3129 Other microscopic hematuria: Secondary | ICD-10-CM | POA: Diagnosis not present

## 2021-02-25 DIAGNOSIS — M545 Low back pain, unspecified: Secondary | ICD-10-CM | POA: Diagnosis not present

## 2021-02-25 NOTE — Addendum Note (Signed)
Addended by: Kelle Darting A on: 02/25/2021 07:29 AM   Modules accepted: Orders

## 2021-02-26 LAB — URINE CULTURE
MICRO NUMBER:: 11676844
SPECIMEN QUALITY:: ADEQUATE

## 2021-02-27 ENCOUNTER — Ambulatory Visit (HOSPITAL_BASED_OUTPATIENT_CLINIC_OR_DEPARTMENT_OTHER)
Admission: RE | Admit: 2021-02-27 | Discharge: 2021-02-27 | Disposition: A | Discharge status: Home / Self Care | Payer: Medicare HMO | Source: Ambulatory Visit | Attending: Family Medicine | Admitting: Family Medicine

## 2021-02-27 ENCOUNTER — Inpatient Hospital Stay: Payer: Medicare HMO | Attending: Hematology & Oncology

## 2021-02-27 ENCOUNTER — Other Ambulatory Visit: Payer: Self-pay

## 2021-02-27 VITALS — BP 120/80 | HR 67 | Temp 98.4°F | Resp 17

## 2021-02-27 DIAGNOSIS — N200 Calculus of kidney: Secondary | ICD-10-CM | POA: Diagnosis not present

## 2021-02-27 DIAGNOSIS — N888 Other specified noninflammatory disorders of cervix uteri: Secondary | ICD-10-CM | POA: Diagnosis not present

## 2021-02-27 DIAGNOSIS — Z90710 Acquired absence of both cervix and uterus: Secondary | ICD-10-CM | POA: Diagnosis not present

## 2021-02-27 DIAGNOSIS — N838 Other noninflammatory disorders of ovary, fallopian tube and broad ligament: Secondary | ICD-10-CM | POA: Diagnosis not present

## 2021-02-27 DIAGNOSIS — E538 Deficiency of other specified B group vitamins: Secondary | ICD-10-CM | POA: Insufficient documentation

## 2021-02-27 DIAGNOSIS — R1030 Lower abdominal pain, unspecified: Secondary | ICD-10-CM | POA: Insufficient documentation

## 2021-02-27 DIAGNOSIS — K760 Fatty (change of) liver, not elsewhere classified: Secondary | ICD-10-CM | POA: Diagnosis not present

## 2021-02-27 MED ORDER — CYANOCOBALAMIN 1000 MCG/ML IJ SOLN
INTRAMUSCULAR | Status: AC
  Filled 2021-02-27: qty 1

## 2021-02-27 MED ORDER — CYANOCOBALAMIN 1000 MCG/ML IJ SOLN
1000.0000 ug | Freq: Once | INTRAMUSCULAR | Status: AC
  Administered 2021-02-27: 1000 ug via INTRAMUSCULAR

## 2021-02-27 NOTE — Patient Instructions (Signed)

## 2021-02-28 ENCOUNTER — Encounter: Payer: Self-pay | Admitting: Family Medicine

## 2021-02-28 NOTE — Telephone Encounter (Signed)
The radiologist said it is non obstructive so it should not be what is causing the pain ----- but we can refer to urology for their  opinion

## 2021-03-03 ENCOUNTER — Other Ambulatory Visit: Payer: Self-pay

## 2021-03-03 ENCOUNTER — Other Ambulatory Visit: Payer: Self-pay | Admitting: Family Medicine

## 2021-03-03 DIAGNOSIS — N200 Calculus of kidney: Secondary | ICD-10-CM

## 2021-03-03 DIAGNOSIS — R34 Anuria and oliguria: Secondary | ICD-10-CM

## 2021-03-03 NOTE — Telephone Encounter (Signed)
Yes-- you can use kidney stone but also oliguria  If pt is unable to urinate at all the is considered a med emergency and she should go to ER

## 2021-03-04 ENCOUNTER — Encounter: Payer: Self-pay | Admitting: Family Medicine

## 2021-03-04 ENCOUNTER — Other Ambulatory Visit: Payer: Self-pay

## 2021-03-04 DIAGNOSIS — N200 Calculus of kidney: Secondary | ICD-10-CM

## 2021-03-04 DIAGNOSIS — R34 Anuria and oliguria: Secondary | ICD-10-CM

## 2021-03-27 ENCOUNTER — Inpatient Hospital Stay: Payer: Medicare HMO | Attending: Hematology & Oncology

## 2021-03-27 ENCOUNTER — Other Ambulatory Visit: Payer: Self-pay

## 2021-03-27 VITALS — BP 124/83 | HR 74 | Temp 98.7°F | Resp 17

## 2021-03-27 DIAGNOSIS — E538 Deficiency of other specified B group vitamins: Secondary | ICD-10-CM | POA: Insufficient documentation

## 2021-03-27 MED ORDER — CYANOCOBALAMIN 1000 MCG/ML IJ SOLN
1000.0000 ug | Freq: Once | INTRAMUSCULAR | Status: AC
  Administered 2021-03-27: 1000 ug via INTRAMUSCULAR

## 2021-03-27 MED ORDER — CYANOCOBALAMIN 1000 MCG/ML IJ SOLN
INTRAMUSCULAR | Status: AC
  Filled 2021-03-27: qty 1

## 2021-03-27 NOTE — Patient Instructions (Signed)

## 2021-03-31 ENCOUNTER — Encounter: Payer: Self-pay | Admitting: Family Medicine

## 2021-03-31 ENCOUNTER — Other Ambulatory Visit: Payer: Self-pay | Admitting: Family Medicine

## 2021-03-31 DIAGNOSIS — R3914 Feeling of incomplete bladder emptying: Secondary | ICD-10-CM | POA: Diagnosis not present

## 2021-03-31 DIAGNOSIS — N200 Calculus of kidney: Secondary | ICD-10-CM | POA: Diagnosis not present

## 2021-03-31 DIAGNOSIS — R351 Nocturia: Secondary | ICD-10-CM | POA: Diagnosis not present

## 2021-03-31 DIAGNOSIS — F419 Anxiety disorder, unspecified: Secondary | ICD-10-CM

## 2021-03-31 NOTE — Telephone Encounter (Signed)
Requesting:Alprazolam  Contract: 10/18/20  UDS: 10/18/20  Last Visit: 02/24/2021 Next Visit: 04/17/2021 Last Refill:  02/18/2021

## 2021-04-14 ENCOUNTER — Other Ambulatory Visit: Payer: Self-pay | Admitting: Neurology

## 2021-04-17 ENCOUNTER — Ambulatory Visit: Payer: Medicare HMO | Admitting: Family Medicine

## 2021-04-21 ENCOUNTER — Ambulatory Visit (INDEPENDENT_AMBULATORY_CARE_PROVIDER_SITE_OTHER): Payer: Medicare HMO | Admitting: Family Medicine

## 2021-04-21 ENCOUNTER — Encounter: Payer: Self-pay | Admitting: Family Medicine

## 2021-04-21 ENCOUNTER — Other Ambulatory Visit: Payer: Self-pay

## 2021-04-21 VITALS — BP 118/88 | HR 74 | Temp 98.2°F | Resp 18 | Ht 62.0 in | Wt 175.4 lb

## 2021-04-21 DIAGNOSIS — R350 Frequency of micturition: Secondary | ICD-10-CM | POA: Diagnosis not present

## 2021-04-21 DIAGNOSIS — I10 Essential (primary) hypertension: Secondary | ICD-10-CM | POA: Diagnosis not present

## 2021-04-21 DIAGNOSIS — I5031 Acute diastolic (congestive) heart failure: Secondary | ICD-10-CM

## 2021-04-21 DIAGNOSIS — E782 Mixed hyperlipidemia: Secondary | ICD-10-CM

## 2021-04-21 DIAGNOSIS — F419 Anxiety disorder, unspecified: Secondary | ICD-10-CM

## 2021-04-21 DIAGNOSIS — C801 Malignant (primary) neoplasm, unspecified: Secondary | ICD-10-CM

## 2021-04-21 DIAGNOSIS — E118 Type 2 diabetes mellitus with unspecified complications: Secondary | ICD-10-CM

## 2021-04-21 NOTE — Patient Instructions (Signed)
[email or web address]">  Generalized Anxiety Disorder, Adult Generalized anxiety disorder (GAD) is a mental health condition. Unlike normal worries, anxiety related to GAD is not triggered by a specific event. These worries do not fade or get better with time. GAD interferes with relationships, work, and school. GAD symptoms can vary from mild to severe. People with severe GAD can have intense waves of anxiety with physical symptoms that are similar to panic attacks. What are the causes? The exact cause of GAD is not known, but the following are believed to have an impact:  Differences in natural brain chemicals.  Genes passed down from parents to children.  Differences in the way threats are perceived.  Development during childhood.  Personality. What increases the risk? The following factors may make you more likely to develop this condition:  Being female.  Having a family history of anxiety disorders.  Being very shy.  Experiencing very stressful life events, such as the death of a loved one.  Having a very stressful family environment. What are the signs or symptoms? People with GAD often worry excessively about many things in their lives, such as their health and family. Symptoms may also include:  Mental and emotional symptoms: ? Worrying excessively about natural disasters. ? Fear of being late. ? Difficulty concentrating. ? Fears that others are judging your performance.  Physical symptoms: ? Fatigue. ? Headaches, muscle tension, muscle twitches, trembling, or feeling shaky. ? Feeling like your heart is pounding or beating very fast. ? Feeling out of breath or like you cannot take a deep breath. ? Having trouble falling asleep or staying asleep, or experiencing restlessness. ? Sweating. ? Nausea, diarrhea, or irritable bowel syndrome (IBS).  Behavioral symptoms: ? Experiencing erratic moods or irritability. ? Avoidance of new situations. ? Avoidance of  people. ? Extreme difficulty making decisions. How is this diagnosed? This condition is diagnosed based on your symptoms and medical history. You will also have a physical exam. Your health care provider may perform tests to rule out other possible causes of your symptoms. To be diagnosed with GAD, a person must have anxiety that:  Is out of his or her control.  Affects several different aspects of his or her life, such as work and relationships.  Causes distress that makes him or her unable to take part in normal activities.  Includes at least three symptoms of GAD, such as restlessness, fatigue, trouble concentrating, irritability, muscle tension, or sleep problems. Before your health care provider can confirm a diagnosis of GAD, these symptoms must be present more days than they are not, and they must last for 6 months or longer. How is this treated? This condition may be treated with:  Medicine. Antidepressant medicine is usually prescribed for long-term daily control. Anti-anxiety medicines may be added in severe cases, especially when panic attacks occur.  Talk therapy (psychotherapy). Certain types of talk therapy can be helpful in treating GAD by providing support, education, and guidance. Options include: ? Cognitive behavioral therapy (CBT). People learn coping skills and self-calming techniques to ease their physical symptoms. They learn to identify unrealistic thoughts and behaviors and to replace them with more appropriate thoughts and behaviors. ? Acceptance and commitment therapy (ACT). This treatment teaches people how to be mindful as a way to cope with unwanted thoughts and feelings. ? Biofeedback. This process trains you to manage your body's response (physiological response) through breathing techniques and relaxation methods. You will work with a therapist while machines are used to monitor your physical   symptoms.  Stress management techniques. These include yoga,  meditation, and exercise. A mental health specialist can help determine which treatment is best for you. Some people see improvement with one type of therapy. However, other people require a combination of therapies.   Follow these instructions at home: Lifestyle  Maintain a consistent routine and schedule.  Anticipate stressful situations. Create a plan, and allow extra time to work with your plan.  Practice stress management or self-calming techniques that you have learned from your therapist or your health care provider. General instructions  Take over-the-counter and prescription medicines only as told by your health care provider.  Understand that you are likely to have setbacks. Accept this and be kind to yourself as you persist to take better care of yourself.  Recognize and accept your accomplishments, even if you judge them as small.  Keep all follow-up visits as told by your health care provider. This is important. Contact a health care provider if:  Your symptoms do not get better.  Your symptoms get worse.  You have signs of depression, such as: ? A persistently sad or irritable mood. ? Loss of enjoyment in activities that used to bring you joy. ? Change in weight or eating. ? Changes in sleeping habits. ? Avoiding friends or family members. ? Loss of energy for normal tasks. ? Feelings of guilt or worthlessness. Get help right away if:  You have serious thoughts about hurting yourself or others. If you ever feel like you may hurt yourself or others, or have thoughts about taking your own life, get help right away. Go to your nearest emergency department or:  Call your local emergency services (911 in the [place]  Call a suicide crisis helpline, such as the National Suicide Prevention Lifeline at 1-800-273-8255. This is open 24 hours a day in the [place]  Text the Crisis Text Line at 741741 (in the [place] Summary  Generalized anxiety disorder (GAD) is a mental  health condition that involves worry that is not triggered by a specific event.  People with GAD often worry excessively about many things in their lives, such as their health and family.  GAD may cause symptoms such as restlessness, trouble concentrating, sleep problems, frequent sweating, nausea, diarrhea, headaches, and trembling or muscle twitching.  A mental health specialist can help determine which treatment is best for you. Some people see improvement with one type of therapy. However, other people require a combination of therapies. This information is not intended to replace advice given to you by your health care provider. Make sure you discuss any questions you have with your health care provider. Document Revised: 09/13/2019 Document Reviewed: 09/13/2019 Elsevier Patient Education  2021 [employer].  

## 2021-04-21 NOTE — Assessment & Plan Note (Signed)
Encouraged heart healthy diet, increase exercise, avoid trans fats, consider a krill oil cap daily 

## 2021-04-21 NOTE — Assessment & Plan Note (Signed)
Stable con't meds 

## 2021-04-21 NOTE — Assessment & Plan Note (Signed)
Well controlled, no changes to meds. Encouraged heart healthy diet such as the DASH diet and exercise as tolerated.  °

## 2021-04-21 NOTE — Assessment & Plan Note (Signed)
con't prozac and prn xanax

## 2021-04-21 NOTE — Progress Notes (Signed)
Patient ID: Ebony Garcia, female    DOB: Oct 23, 1951  Age: 70 y.o. MRN: [id number]    Subjective:  Subjective  HPI Ebony Garcia presents for an office visit today. She complains of difficulty urinating. She states that at night, she is unable to urinate when needed. She notes that she had passed 5 brown sediment in her urine, however it was not painful or recurrent. She also complains of intermittent leg pain. She states that she had fallen x1 year ago and she still experience pain from the incident. She denies any chest pain, SOB, fever, abdominal pain, cough, chills, sore throat, back pain, HA, or N/V/D at this time.  She endorses trying to reduce her 1 mg Xanax PO Daily for her Dx of depression. She notes that she experiences lack of motivation, however she states that she is trying to improve it. She does not want to inc her prozac  Review of Systems  Constitutional: Negative for chills, fatigue and fever.  HENT: Negative for ear pain, rhinorrhea, sinus pressure, sinus pain, sore throat and tinnitus.   Eyes: Negative for pain.  Respiratory: Negative for cough, shortness of breath and wheezing.   Cardiovascular: Negative for chest pain.  Gastrointestinal: Negative for abdominal pain, anal bleeding, constipation, diarrhea, nausea and vomiting.  Genitourinary: Positive for difficulty urinating. Negative for flank pain.  Musculoskeletal: Negative for back pain and neck pain.       (+) foot pain    Skin: Negative for rash.  Neurological: Negative for seizures, weakness, light-headedness, numbness and headaches.    History Past Medical History:  Diagnosis Date  . Acute diastolic CHF (congestive heart failure) ([place]) 04/30/2013   4/12-87/86 acute diastolic heart failure  BNP 1528 2 D echocardiogram essentially negative  Chest x-ray revealed cardiomegaly with costophrenic angle blunting and suggestion of possible interstitial vascular accentuation; no frank heart failure present. 10  pound weight loss with 20 mg of furosemide daily  No definite etiology of the cardiomegaly or acute diastolic failure   . Anxiety 02/07/2015  . Auditory hallucination 10/06/2017  . B12 deficiency 10/07/2018  . Bradycardia 04/30/2013  . Cancer ([place])   . Chest pain 05/14/2020  . Chest pain with moderate risk of acute coronary syndrome 02/07/2015  . CHF (congestive heart failure) ([place])    dystolic heart dysfunction  . Chronic kidney disease   . De Quervain's tenosynovitis, left 05/23/2013  . Depression   . Depression with anxiety   . Depressive disorder, not elsewhere classified 05/31/2007   Centricity Description: DISORDER, DEPRESSIVE NEC Qualifier: Diagnosis of  By: Linna Darner MD, [place] Octave, [place]   . Diabetes mellitus without complication ([place])    Diet controlled  . Dysmetabolic syndrome X 7/67/2094   Qualifier: Diagnosis of  By: Linna Darner MD, [place]  A1c 6.3% in 2/12; TG 278   . Essential hypertension 08/09/2008   Qualifier: Diagnosis of  By: Linna Darner MD, Gwyndolyn Saxon    . Estrogen deficiency 02/02/2019  . Estrogen deficiency 02/02/2019  . Fatigue 02/02/2019  . Hair loss 02/02/2019  . Hyperlipidemia 02/27/2008   Qualifier: Diagnosis of  By: Linna Darner MD, Gwyndolyn Saxon    . Hypertension   . Intertrigo   . INTERTRIGO 11/08/2009   Qualifier: Diagnosis of  By: Inda Castle FNP, [place]   . Intractable migraine with aura without status migrainosus 08/03/2018  . Migraine variant 08/15/2008   Qualifier: Diagnosis of  By: Linna Darner MD, Gwyndolyn Saxon   Onset:initially @ age 23;recurrence age 39 during 27st  trimester. Recurrence @ 39. No prodrome or aura Previously on Tryptans & Topamax with temporary relief but tolerance appeared Botox by Dr Leward Quan 09/2010  most effective    . Migraines   . Myalgia 02/02/2019  . NONSPECIFIC ABNORMAL ELECTROCARDIOGRAM 08/15/2008   Qualifier: Diagnosis of  By: Linna Darner MD, Gwyndolyn Saxon    . Palpitation 04/17/2014  . Panic attacks   . Panic attacks   . Polydipsia  11/02/2007   Qualifier: Diagnosis of  By: Linna Darner MD, [place]    . RENAL CALCULUS 02/21/2007   Qualifier: Diagnosis of  By: Allen Norris    . Squamous cell carcinoma in situ of skin 12/26/2015  . Thrombocytopenia ([place]) 10/06/2017  . Viral syndrome 04/10/2019  . WEIGHT GAIN 11/02/2007   Qualifier: Diagnosis of  By: Linna Darner MD, Gwyndolyn Saxon      She has a past surgical history that includes Total abdominal hysterectomy w/ bilateral salpingoophorectomy; Tonsillectomy and adenoidectomy; Nasal septum surgery; Tubal ligation; Dilation and curettage of uterus; papaloma (Right); Breast surgery; and Abdominal hysterectomy.   Her family history includes Anemia in her cousin; Atrial fibrillation in her mother; Bipolar disorder in her brother and sister; Bladder Cancer in her father; Cancer in her brother, cousin, father, maternal aunt, maternal aunt, and paternal grandfather; Colon cancer in her maternal uncle; Crohn's disease in her mother; Heart attack in her maternal grandmother; Heart failure in her mother.She reports that she has never smoked. She has never used smokeless tobacco. She reports that she does not drink alcohol and does not use drugs.  Current Outpatient Medications on File Prior to Visit  Medication Sig Dispense Refill  . ALPRAZolam (XANAX) 1 MG tablet TAKE 1 TABLET BY MOUTH EVERY DAY AT BEDTIME AS NEEDED FOR SLEEP 30 tablet 0  . aspirin EC 81 MG tablet Take 1 tablet (81 mg total) by mouth daily. Swallow whole. 30 tablet 11  . FLUoxetine (PROZAC) 20 MG capsule Take 1 capsule (20 mg total) by mouth daily. 90 capsule 1  . furosemide (LASIX) 20 MG tablet Take 1 tablet (20 mg total) by mouth daily. 90 tablet 1  . losartan (COZAAR) 50 MG tablet Take 1 tablet (50 mg total) by mouth daily. 90 tablet 1  . promethazine (PHENERGAN) 25 MG tablet TAKE ONE TABLET BY MOUTH EVERY 6 HOURS AS NEEDED FOR NAUSEA AND VOMITING 30 tablet 2  . topiramate (TOPAMAX) 100 MG tablet TAKE 1 TABLET TWICE DAILY 180 tablet 1   . triamcinolone ointment (KENALOG) 0.1 % Apply topically as needed     No current facility-administered medications on file prior to visit.     Objective:  Objective  Physical Exam Vitals and nursing note reviewed.  Constitutional:      General: She is not in acute distress.    Appearance: Normal appearance. She is well-developed. She is not ill-appearing.  HENT:     Head: Normocephalic and atraumatic.     Right Ear: External ear normal.     Left Ear: External ear normal.     Nose: Nose normal.  Eyes:     General:        Right eye: No discharge.        Left eye: No discharge.     Extraocular Movements: Extraocular movements intact.     Pupils: Pupils are equal, round, and reactive to light.  Cardiovascular:     Rate and Rhythm: Normal rate and regular rhythm.     Pulses: Normal pulses.     Heart sounds: Normal heart sounds.  No murmur heard. No friction rub. No gallop.   Pulmonary:     Effort: Pulmonary effort is normal. No respiratory distress.     Breath sounds: Normal breath sounds. No stridor. No wheezing, rhonchi or rales.  Chest:     Chest wall: No tenderness.  Abdominal:     General: Bowel sounds are normal. There is no distension.     Palpations: Abdomen is soft. There is no mass.     Tenderness: There is no abdominal tenderness. There is no guarding or rebound.     Hernia: No hernia is present.  Musculoskeletal:        General: Normal range of motion.     Cervical back: Normal range of motion and neck supple.     Right lower leg: No edema.     Left lower leg: No edema.  Skin:    General: Skin is warm and dry.  Neurological:     Mental Status: She is alert and oriented to person, place, and time.  Psychiatric:        Behavior: Behavior normal.        Thought Content: Thought content normal.    BP 118/88 (BP Location: Right Arm, Patient Position: Sitting, Cuff Size: Large)   Pulse 74   Temp 98.2 F (36.8 C) (Oral)   Resp 18   Ht 5\' 2"  (1.575 m)   Wt  175 lb 6.4 oz (79.6 kg)   SpO2 98%   BMI 32.08 kg/m  Wt Readings from Last 3 Encounters:  04/21/21 175 lb 6.4 oz (79.6 kg)  02/24/21 174 lb 12.8 oz (79.3 kg)  02/17/21 176 lb (79.8 kg)     Lab Results  Component Value Date   WBC 6.0 01/02/2021   HGB 13.4 01/02/2021   HCT 41.5 01/02/2021   PLT 69 (L) 01/02/2021   GLUCOSE 119 (H) 01/02/2021   CHOL 240 (H) 10/18/2020   TRIG 216 (H) 10/18/2020   HDL 53 10/18/2020   LDLDIRECT 121.0 09/20/2017   LDLCALC 151 (H) 10/18/2020   ALT 21 01/02/2021   AST 19 01/02/2021   NA 142 01/02/2021   K 4.1 01/02/2021   CL 107 01/02/2021   CREATININE 1.02 (H) 01/02/2021   BUN 17 01/02/2021   CO2 28 01/02/2021   TSH 2.99 10/18/2020   INR 0.93 07/19/2010   HGBA1C 5.5 10/18/2020   MICROALBUR 1.1 09/20/2017    US Abdomen Complete  Result Date: 02/28/2021 CLINICAL DATA:  70 year old female with lower abdominal pain. EXAM: ABDOMEN ULTRASOUND COMPLETE COMPARISON:  None. FINDINGS: Gallbladder: No gallstones or wall thickening visualized. No sonographic Murphy sign noted by sonographer. Common bile duct: Diameter: 3 mm Liver: There is diffuse increased liver echogenicity most commonly seen in the setting of fatty infiltration. Superimposed inflammation or fibrosis is not excluded. Clinical correlation is recommended. Portal vein is patent on color Doppler imaging with normal direction of blood flow towards the liver. IVC: No abnormality visualized. Pancreas: Visualized portion unremarkable. Spleen: Size and appearance within normal limits. Right Kidney: Length: 9.6 cm. Normal echogenicity. No hydronephrosis or shadowing stone. Left Kidney: Length: 9.7 cm. Normal echogenicity. There is a 5 mm nonobstructing interpolar calculus. No hydronephrosis. Abdominal aorta: No aneurysm visualized. Other findings: None. IMPRESSION: 1. Fatty liver. 2. A 5 mm nonobstructing left renal calculus.  No hydronephrosis. Electronically Signed   By: Anner Crete M.D.   On:  02/28/2021 01:39   US Pelvic Complete With Transvaginal  Result Date: 03/01/2021 CLINICAL DATA:  Lower abdominal  and flank pain, prior hysterectomy EXAM: TRANSABDOMINAL AND TRANSVAGINAL ULTRASOUND OF PELVIS TECHNIQUE: Both transabdominal and transvaginal ultrasound examinations of the pelvis were performed. Transabdominal technique was performed for global imaging of the pelvis including uterus, ovaries, adnexal regions, and pelvic cul-de-sac. It was necessary to proceed with endovaginal exam following the transabdominal exam to visualize the adnexa. COMPARISON:  None FINDINGS: Uterus Surgically absent. Retained cervix containing multiple nabothian cysts. Endometrium N/A Right ovary Surgically absent Left ovary Surgically absent Other findings No free pelvic fluid.  No pelvic masses. IMPRESSION: Post hysterectomy and BILATERAL oophorectomy. Retained cervix containing multiple nabothian cysts. No other pelvic sonographic abnormalities. Electronically Signed   By: Lavonia Dana M.D.   On: 03/01/2021 12:46     Assessment & Plan:  Plan    No orders of the defined types were placed in this encounter.   Problem List Items Addressed This Visit      Unprioritized   Acute diastolic CHF (congestive heart failure) (HCC)    Stable con't meds      Anxiety    con't prozac and prn xanax       Cancer ([place])   Essential hypertension    Well controlled, no changes to meds. Encouraged heart healthy diet such as the DASH diet and exercise as tolerated.       Hyperlipidemia    Encouraged heart healthy diet, increase exercise, avoid trans fats, consider a krill oil cap daily       Other Visit Diagnoses    Controlled type 2 diabetes mellitus with complication, without long-term current use of insulin ([place])    -  Primary   Relevant Orders   Hemoglobin A1c   Lipid panel   Urinary frequency       Relevant Orders   Microalbumin / creatinine urine ratio   POCT Urinalysis Dipstick (Automated)       Follow-up: Return in about 6 months (around 10/22/2021), or if symptoms worsen or fail to improve, for annual exam, fasting.   I,Gordon Zheng,acting as a Education administrator for [employer], DO.,have documented all relevant documentation on the behalf of [name], DO,as directed by  [name], DO while in the presence of [place], DO, have reviewed all documentation for this visit. The documentation on 04/21/21 for the exam, diagnosis, procedures, and orders are all accurate and complete. [name], DO

## 2021-04-23 ENCOUNTER — Inpatient Hospital Stay (HOSPITAL_BASED_OUTPATIENT_CLINIC_OR_DEPARTMENT_OTHER): Payer: Medicare HMO | Admitting: Family

## 2021-04-23 ENCOUNTER — Inpatient Hospital Stay: Payer: Medicare HMO

## 2021-04-23 ENCOUNTER — Other Ambulatory Visit (INDEPENDENT_AMBULATORY_CARE_PROVIDER_SITE_OTHER): Payer: Medicare HMO

## 2021-04-23 ENCOUNTER — Encounter: Payer: Self-pay | Admitting: Family

## 2021-04-23 ENCOUNTER — Encounter: Payer: Self-pay | Admitting: *Deleted

## 2021-04-23 ENCOUNTER — Other Ambulatory Visit: Payer: Self-pay

## 2021-04-23 ENCOUNTER — Inpatient Hospital Stay: Payer: Medicare HMO | Attending: Hematology & Oncology

## 2021-04-23 VITALS — BP 118/84 | HR 63 | Temp 98.9°F | Resp 18 | Wt 177.0 lb

## 2021-04-23 DIAGNOSIS — R350 Frequency of micturition: Secondary | ICD-10-CM

## 2021-04-23 DIAGNOSIS — E538 Deficiency of other specified B group vitamins: Secondary | ICD-10-CM

## 2021-04-23 DIAGNOSIS — E118 Type 2 diabetes mellitus with unspecified complications: Secondary | ICD-10-CM | POA: Diagnosis not present

## 2021-04-23 DIAGNOSIS — D6959 Other secondary thrombocytopenia: Secondary | ICD-10-CM | POA: Insufficient documentation

## 2021-04-23 DIAGNOSIS — R82998 Other abnormal findings in urine: Secondary | ICD-10-CM | POA: Diagnosis not present

## 2021-04-23 DIAGNOSIS — D696 Thrombocytopenia, unspecified: Secondary | ICD-10-CM

## 2021-04-23 LAB — CMP (CANCER CENTER ONLY)
ALT: 14 U/L (ref 0–44)
AST: 15 U/L (ref 15–41)
Albumin: 4.5 g/dL (ref 3.5–5.0)
Alkaline Phosphatase: 84 U/L (ref 38–126)
Anion gap: 6 (ref 5–15)
BUN: 12 mg/dL (ref 8–23)
CO2: 28 mmol/L (ref 22–32)
Calcium: 9.7 mg/dL (ref 8.9–10.3)
Chloride: 108 mmol/L (ref 98–111)
Creatinine: 1.01 mg/dL — ABNORMAL HIGH (ref 0.44–1.00)
GFR, Estimated: >60 mL/min (ref >60)
Glucose, Bld: 121 mg/dL — ABNORMAL HIGH (ref 70–99)
Potassium: 4.7 mmol/L (ref 3.5–5.1)
Sodium: 142 mmol/L (ref 135–145)
Total Bilirubin: 0.3 mg/dL (ref 0.3–1.2)
Total Protein: 6.8 g/dL (ref 6.5–8.1)

## 2021-04-23 LAB — POC URINALSYSI DIPSTICK (AUTOMATED)
Bilirubin, UA: NEGATIVE
Blood, UA: NEGATIVE
Glucose, UA: NEGATIVE
Ketones, UA: NEGATIVE
Nitrite, UA: NEGATIVE
Protein, UA: NEGATIVE
Spec Grav, UA: 1.015 (ref 1.010–1.025)
Urobilinogen, UA: 0.2 E.U./dL
pH, UA: 6.5 (ref 5.0–8.0)

## 2021-04-23 LAB — LIPID PANEL
Cholesterol: 212 — AB (ref 0–200)
HDL: 49 (ref 35–70)
LDL Cholesterol: 126
Triglycerides: 211 — AB (ref 40–160)

## 2021-04-23 LAB — MICROALBUMIN / CREATININE URINE RATIO
Creatinine,U: 134.6 mg/dL
Microalb Creat Ratio: 0.5 mg/g (ref 0.0–30.0)
Microalb, Ur: <0.7 mg/dL (ref 0.0–1.9)

## 2021-04-23 LAB — CBC WITH DIFFERENTIAL (CANCER CENTER ONLY)
Abs Immature Granulocytes: 0.01 10*3/uL (ref 0.00–0.07)
Basophils Absolute: 0.1 10*3/uL (ref 0.0–0.1)
Basophils Relative: 1 %
Eosinophils Absolute: 0.1 10*3/uL (ref 0.0–0.5)
Eosinophils Relative: 2 %
HCT: 41.4 % (ref 36.0–46.0)
Hemoglobin: 13.2 g/dL (ref 12.0–15.0)
Immature Granulocytes: 0 %
Lymphocytes Relative: 26 %
Lymphs Abs: 1.6 10*3/uL (ref 0.7–4.0)
MCH: 30.6 pg (ref 26.0–34.0)
MCHC: 31.9 g/dL (ref 30.0–36.0)
MCV: 95.8 fL (ref 80.0–100.0)
Monocytes Absolute: 0.4 10*3/uL (ref 0.1–1.0)
Monocytes Relative: 6 %
Neutro Abs: 3.9 10*3/uL (ref 1.7–7.7)
Neutrophils Relative %: 65 %
Platelet Count: 69 10*3/uL — ABNORMAL LOW (ref 150–400)
RBC: 4.32 MIL/uL (ref 3.87–5.11)
RDW: 13 % (ref 11.5–15.5)
WBC Count: 6.1 10*3/uL (ref 4.0–10.5)
nRBC: 0 % (ref 0.0–0.2)

## 2021-04-23 LAB — PLATELET BY CITRATE

## 2021-04-23 LAB — VITAMIN B12: Vitamin B-12: 476 pg/mL (ref 180–914)

## 2021-04-23 MED ORDER — CYANOCOBALAMIN 1000 MCG/ML IJ SOLN
INTRAMUSCULAR | Status: AC
  Filled 2021-04-23: qty 1

## 2021-04-23 MED ORDER — CYANOCOBALAMIN 1000 MCG/ML IJ SOLN
1000.0000 ug | Freq: Once | INTRAMUSCULAR | Status: AC
  Administered 2021-04-23: 1000 ug via INTRAMUSCULAR

## 2021-04-23 NOTE — Progress Notes (Signed)
Hematology and Oncology Follow Up Visit  Ebony Garcia [id number] 1951/05/24 70 y.o. 04/23/2021   Principle Diagnosis:  Medication induced thrombocytopenia B 12 deficiency  Current Therapy: Observation B 12 injection monthly   Interim History:  Ebony Garcia is here today for follow-up and B12 injection. She is doing fairly well. She has fatigue and states that she did not sleep well last night.  Platelets are stable at 69, WBC count 6.1, Hgb 13.2, MCV 95.  No fever, chills, n/v, cough, rash, dizziness, SOB, palpitations, abdominal pain or changes in bowel or bladder habits.  No blood loss noted. She bruises easily but not in excess. No petechiae.  No swelling in her extremities at this time.  She feels that she has peripheral neuropathy in her hands and feet that is stable.  No falls or syncope.  Her appetite comes and goes. She is doing her best to stay well hydrated. Her weight is stable at 177 lbs.   ECOG Performance Status: 1 - Symptomatic but completely ambulatory  Medications:  Allergies as of 04/23/2021      Reactions   Amoxicillin-pot Clavulanate Diarrhea, Other (See Comments)   Antihistamines, Diphenhydramine-type Other (See Comments)   Other Reaction: makes pt nervous   Diphenhydramine Hcl Other (See Comments)   Other Reaction: makes pt nervous   Fremanezumab-vfrm Rash, Other (See Comments)   Seizure, jerking   Other Other (See Comments)   Tramadol Hcl Other (See Comments)   Clindamycin Other (See Comments)   She has not taken but per patient  "her mother had a reaction and she does not want it ever"   Hydrocodone-acetaminophen Other (See Comments)   REACTION: sensitive to  but can take for extreme pain per patient   Lisinopril    Restless , irregular sleep   Nortriptyline Other (See Comments)   UNKNOWN REACTION   Propoxyphene N-acetaminophen Nausea Only   Sulfa Antibiotics Other (See Comments)   Unknown reaction   Tramadol Other (See  Comments)   UNKNOWN REACTION      Medication List       Accurate as of Apr 23, 2021  2:00 PM. If you have any questions, ask your nurse or doctor.        ALPRAZolam 1 MG tablet Commonly known as: XANAX TAKE 1 TABLET BY MOUTH EVERY DAY AT BEDTIME AS NEEDED FOR SLEEP   aspirin EC 81 MG tablet Take 1 tablet (81 mg total) by mouth daily. Swallow whole.   FLUoxetine 20 MG capsule Commonly known as: PROZAC Take 1 capsule (20 mg total) by mouth daily.   furosemide 20 MG tablet Commonly known as: LASIX Take 1 tablet (20 mg total) by mouth daily.   losartan 50 MG tablet Commonly known as: COZAAR Take 1 tablet (50 mg total) by mouth daily.   promethazine 25 MG tablet Commonly known as: PHENERGAN TAKE ONE TABLET BY MOUTH EVERY 6 HOURS AS NEEDED FOR NAUSEA AND VOMITING   topiramate 100 MG tablet Commonly known as: TOPAMAX TAKE 1 TABLET TWICE DAILY   triamcinolone ointment 0.1 % Commonly known as: KENALOG Apply topically as needed       Allergies:  Allergies  Allergen Reactions  . Amoxicillin-Pot Clavulanate Diarrhea and Other (See Comments)  . Antihistamines, Diphenhydramine-Type Other (See Comments)    Other Reaction: makes pt nervous  . Diphenhydramine Hcl Other (See Comments)    Other Reaction: makes pt nervous  . Fremanezumab-Vfrm Rash and Other (See Comments)    Seizure, jerking  . Other  Other (See Comments)  . Tramadol Hcl Other (See Comments)  . Clindamycin Other (See Comments)    She has not taken but per patient  "her mother had a reaction and she does not want it ever"  . Hydrocodone-Acetaminophen Other (See Comments)    REACTION: sensitive to  but can take for extreme pain per patient  . Lisinopril     Restless , irregular sleep  . Nortriptyline Other (See Comments)    UNKNOWN REACTION  . Propoxyphene N-Acetaminophen Nausea Only  . Sulfa Antibiotics Other (See Comments)    Unknown reaction  . Tramadol Other (See Comments)    UNKNOWN REACTION     Past Medical History, Surgical history, Social history, and Family History were reviewed and updated.  Review of Systems: All other 10 point review of systems is negative.   Physical Exam:  vitals were not taken for this visit.   Wt Readings from Last 3 Encounters:  04/21/21 175 lb 6.4 oz (79.6 kg)  02/24/21 174 lb 12.8 oz (79.3 kg)  02/17/21 176 lb (79.8 kg)    Ocular: Sclerae unicteric, pupils equal, round and reactive to light Ear-nose-throat: Oropharynx clear, dentition fair Lymphatic: No cervical or supraclavicular adenopathy Lungs no rales or rhonchi, good excursion bilaterally Heart regular rate and rhythm, no murmur appreciated Abd soft, nontender, positive bowel sounds MSK no focal spinal tenderness, no joint edema Neuro: non-focal, well-oriented, appropriate affect Breasts: Deferred   Lab Results  Component Value Date   WBC 6.0 01/02/2021   HGB 13.4 01/02/2021   HCT 41.5 01/02/2021   MCV 94.3 01/02/2021   PLT 69 (L) 01/02/2021   No results found for: FERRITIN, IRON, TIBC, UIBC, IRONPCTSAT Lab Results  Component Value Date   RBC 4.40 01/02/2021   No results found for: KPAFRELGTCHN, LAMBDASER, KAPLAMBRATIO No results found for: IGGSERUM, IGA, IGMSERUM No results found for: Odetta Pink, SPEI   Chemistry      Component Value Date/Time   NA 142 01/02/2021 1432   NA 143 11/11/2017 1339   K 4.1 01/02/2021 1432   K 3.6 11/11/2017 1339   CL 107 01/02/2021 1432   CL 109 (H) 11/11/2017 1339   CO2 28 01/02/2021 1432   CO2 24 11/11/2017 1339   BUN 17 01/02/2021 1432   BUN 9 11/11/2017 1339   CREATININE 1.02 (H) 01/02/2021 1432   CREATININE 1.00 (H) 10/18/2020 1433      Component Value Date/Time   CALCIUM 9.6 01/02/2021 1432   CALCIUM 9.3 11/11/2017 1339   ALKPHOS 88 01/02/2021 1432   ALKPHOS 85 (H) 11/11/2017 1339   AST 19 01/02/2021 1432   ALT 21 01/02/2021 1432   ALT 24 11/11/2017 1339    BILITOT 0.3 01/02/2021 1432       Impression and Plan: Ebony Garcia is a pleasant 70yo caucasian female with thrombocytopenia and B12 deficiency. She received B 12 injection today as planned and will continue her monthly injection schedule.  Follow-up in 4 months.  She was encouraged to contact our office with any questions or concerns.   Ebony Peace, NP 5/18/20222:00 PM

## 2021-04-23 NOTE — Patient Instructions (Signed)

## 2021-04-24 ENCOUNTER — Ambulatory Visit: Payer: Medicare HMO

## 2021-04-24 ENCOUNTER — Telehealth: Payer: Self-pay | Admitting: *Deleted

## 2021-04-24 ENCOUNTER — Ambulatory Visit: Payer: Medicare HMO | Admitting: Family

## 2021-04-24 LAB — URINE CULTURE
MICRO NUMBER:: 11905739
SPECIMEN QUALITY:: ADEQUATE

## 2021-04-24 NOTE — Telephone Encounter (Signed)
Per 04/23/21 los - called and lvm of upcoming appointments - mailed calendar with welcome packet

## 2021-04-25 ENCOUNTER — Encounter: Payer: Self-pay | Admitting: Family Medicine

## 2021-04-25 ENCOUNTER — Other Ambulatory Visit: Payer: Self-pay

## 2021-04-25 DIAGNOSIS — R339 Retention of urine, unspecified: Secondary | ICD-10-CM

## 2021-04-25 DIAGNOSIS — R350 Frequency of micturition: Secondary | ICD-10-CM

## 2021-04-25 MED ORDER — CIPROFLOXACIN HCL 250 MG PO TABS: 250.0000 mg | ORAL_TABLET | Freq: Two times a day (BID) | ORAL | 0 refills | Status: AC

## 2021-04-25 NOTE — Telephone Encounter (Signed)
noted 

## 2021-04-25 NOTE — Telephone Encounter (Signed)
See lab results.  

## 2021-04-28 ENCOUNTER — Other Ambulatory Visit: Payer: Self-pay

## 2021-04-28 ENCOUNTER — Other Ambulatory Visit (INDEPENDENT_AMBULATORY_CARE_PROVIDER_SITE_OTHER): Payer: Medicare HMO

## 2021-04-28 DIAGNOSIS — R339 Retention of urine, unspecified: Secondary | ICD-10-CM | POA: Diagnosis not present

## 2021-04-29 LAB — URINE CULTURE
MICRO NUMBER:: 11922140
Result:: NO GROWTH
SPECIMEN QUALITY:: ADEQUATE

## 2021-05-09 ENCOUNTER — Encounter: Payer: Self-pay | Admitting: Family Medicine

## 2021-05-10 ENCOUNTER — Other Ambulatory Visit: Payer: Self-pay | Admitting: Family Medicine

## 2021-05-10 DIAGNOSIS — F419 Anxiety disorder, unspecified: Secondary | ICD-10-CM

## 2021-05-12 NOTE — Telephone Encounter (Signed)
Requesting: alprazolam Contract:  UDS: 10/18/20 Last Visit: 04/21/21 Next Visit: 11/04/21 Last Refill: 03/31/21  Please Advise

## 2021-05-23 ENCOUNTER — Inpatient Hospital Stay: Payer: Medicare HMO | Attending: Hematology & Oncology

## 2021-05-23 ENCOUNTER — Other Ambulatory Visit: Payer: Self-pay

## 2021-05-23 VITALS — BP 120/67 | HR 93 | Temp 97.9°F | Resp 17

## 2021-05-23 DIAGNOSIS — E538 Deficiency of other specified B group vitamins: Secondary | ICD-10-CM | POA: Insufficient documentation

## 2021-05-23 MED ORDER — CYANOCOBALAMIN 1000 MCG/ML IJ SOLN
1000.0000 ug | Freq: Once | INTRAMUSCULAR | Status: AC
  Administered 2021-05-23: 1000 ug via INTRAMUSCULAR

## 2021-05-23 NOTE — Patient Instructions (Signed)

## 2021-06-19 ENCOUNTER — Other Ambulatory Visit: Payer: Self-pay | Admitting: Family Medicine

## 2021-06-19 DIAGNOSIS — F419 Anxiety disorder, unspecified: Secondary | ICD-10-CM

## 2021-06-19 NOTE — Telephone Encounter (Signed)
Requesting: alprazolam 1mg  Contract: 10/18/2020 UDS: 10/18/2020 Last Visit: 04/21/2021 Next Visit: 11/04/2021 Last Refill: 05/12/2021 #30 and 0RF  Please Advise

## 2021-06-23 ENCOUNTER — Other Ambulatory Visit: Payer: Self-pay | Admitting: Family Medicine

## 2021-06-23 ENCOUNTER — Other Ambulatory Visit: Payer: Self-pay

## 2021-06-23 ENCOUNTER — Inpatient Hospital Stay: Payer: Medicare HMO | Attending: Hematology & Oncology

## 2021-06-23 VITALS — BP 125/77 | HR 70 | Temp 97.7°F | Resp 18

## 2021-06-23 DIAGNOSIS — E538 Deficiency of other specified B group vitamins: Secondary | ICD-10-CM | POA: Insufficient documentation

## 2021-06-23 DIAGNOSIS — I5189 Other ill-defined heart diseases: Secondary | ICD-10-CM

## 2021-06-23 DIAGNOSIS — I10 Essential (primary) hypertension: Secondary | ICD-10-CM

## 2021-06-23 MED ORDER — CYANOCOBALAMIN 1000 MCG/ML IJ SOLN
1000.0000 ug | Freq: Once | INTRAMUSCULAR | Status: AC
  Administered 2021-06-23: 1000 ug via INTRAMUSCULAR

## 2021-06-23 NOTE — Patient Instructions (Signed)
Cyanocobalamin, Vitamin B12 injection O que  este medicamento? A CIANOCOBALAMINA  uma forma sinttica de vitamina B12. A vitamina B12  essencial para o desenvolvimento de glbulos sanguneos, clulas nervosas e protenas saudveis pelo organismo. Tambm ajuda no metabolismo das gorduras e carboidratos. Este medicamento  usado para tratar pessoas que no conseguem absorver vitamina B12 suficiente. Este medicamento pode ser usado para outros propsitos; em caso de dvidas, pergunte ao seu profissional de sade ou farmacutico. NOMES DE MARCAS COMUNS: B-12 Compliance Kit, B-12 Injection Kit, Cyomin, LA-12, Nutri-Twelve, Physicians EZ Use B-12, Primabalt O que devo dizer a meu profissional de sade antes de tomar este medicamento? Precisam saber se voc tem algum dos seguintes problemas ou estados de sade: doenas renais sndrome ou doena de Leber anemia megaloblstica reao estranha ou alergia  cianocobalamina ou ao cobalto reao estranha ou alergia a outros medicamentos, alimentos, corantes ou conservantes est grvida ou tentando engravidar est amamentando Como devo usar este medicamento? Este medicamento  injetado por via intramuscular ou por injeo subcutnea profunda. Costuma ser administrado por um profissional de sade em consultrio ou Chief of Staff. Porm,  possvel que seu mdico lhe ensine como aplicar suas prprias injees. Siga todas as instrues. Fale com seu pediatra a respeito do uso deste medicamento em crianas. Pode ser preciso tomar alguns cuidados especiais. Superdosagem: Se achar que tomou uma superdosagem deste medicamento, entre em contato imediatamente com o [facility]tox[facility] ou v a [employer]. OBSERVAO: Este medicamento  s para voc. No compartilhe este medicamento com outras pessoas. E se eu deixar de tomar uma dose? Se toma o medicamento em uma clnica ou no consultrio do seu mdico, ligue para Paramedic a Passenger transport manager. Se aplica as  injees por conta prpria e perdeu uma dose, tome-a assim que possvel. Se j estiver quase na hora da sua prxima dose, tome somente essa dose. No tome o remdio em dobro, nem tome uma dose adicional. O que pode interagir com este medicamento? colchicina consumo pesado de lcool Esta lista pode no descrever todas as interaes possveis. D ao seu profissional de sade uma lista de todos os medicamentos, ervas medicinais, remdios de venda livre, ou suplementos alimentares que voc [place]. Diga tambm se voc fuma, bebe, ou [place] drogas ilcitas. Alguns destes podem interagir com o seu medicamento. Ao que devo ficar atento quando estiver [employer] medicamento? Consulte seu mdico ou profissional de sade para acompanhamento regular Museum/gallery curator. Voc precisar fazer exames de sangue peridicos enquanto estiver [employer]. Voc pode precisar seguir uma dieta especial. Fale com seu mdico. Para conseguir o mximo benefcio deste medicamento, limite o seu consumo de lcool e evite fumar. Que efeitos colaterais posso sentir aps usar este medicamento? Efeitos colaterais que devem ser informados ao seu mdico ou profissional de sade o mais rpido possvel: reaes alrgicas, como erupo na pele, coceira, urticria, ou inchao do rosto, dos lbios ou da lngua pele azulada dor ou aperto no peito chiado no peito ou dificuldade para respirar tontura rea vermelha, inchada e dolorosa na perna Efeitos colaterais que normalmente no precisam de cuidados mdicos (avise ao seu mdico ou profissional de sade se persistirem ou forem incmodos): diarreia dor de cabea Esta lista pode no descrever todos os efeitos colaterais possveis. Para mais orientaes sobre efeitos colaterais, consulte o seu mdico. Voc pode relatar a ocorrncia de efeitos colaterais  FDA pelo telefone [phone]. Onde devo guardar meu medicamento? [facility] fora do [employer]. Conservar em [employer], entre 15 e 30 degreesC (  59 e 86 degreesF). Proteger Administrator, arts. Descartar qualquer medicamento no utilizado aps a data de validade impressa no rtulo ou embalagem. OBSERVAO: Este folheto  um resumo. Pode no cobrir todas as informaes possveis. Se tiver dvidas a respeito deste medicamento, fale com seu mdico, farmacutico ou profissional de sade.  2021 Elsevier/Gold Standard (2010-08-27 00:00:00)

## 2021-07-24 ENCOUNTER — Inpatient Hospital Stay: Payer: Medicare HMO | Attending: Hematology & Oncology

## 2021-07-24 ENCOUNTER — Other Ambulatory Visit: Payer: Self-pay

## 2021-07-24 VITALS — BP 119/77 | HR 88 | Temp 98.4°F | Resp 18

## 2021-07-24 DIAGNOSIS — E538 Deficiency of other specified B group vitamins: Secondary | ICD-10-CM | POA: Insufficient documentation

## 2021-07-24 MED ORDER — CYANOCOBALAMIN 1000 MCG/ML IJ SOLN
1000.0000 ug | Freq: Once | INTRAMUSCULAR | Status: AC
  Administered 2021-07-24: 1000 ug via INTRAMUSCULAR
  Filled 2021-07-24: qty 1

## 2021-07-24 NOTE — Patient Instructions (Signed)

## 2021-07-28 ENCOUNTER — Other Ambulatory Visit: Payer: Self-pay | Admitting: Family Medicine

## 2021-07-28 DIAGNOSIS — F419 Anxiety disorder, unspecified: Secondary | ICD-10-CM

## 2021-07-28 DIAGNOSIS — H43393 Other vitreous opacities, bilateral: Secondary | ICD-10-CM | POA: Diagnosis not present

## 2021-07-29 NOTE — Telephone Encounter (Signed)
Requesting: xanax 1 mg  Contract:10/18/20 UDS: 10/18/20 Last Visit:04/21/21 Next Visit:11/04/21 Last Refill:06/19/21  Please Advise

## 2021-08-22 ENCOUNTER — Telehealth: Payer: Self-pay | Admitting: *Deleted

## 2021-08-22 NOTE — Telephone Encounter (Signed)
Patient called to cancel appointment on 08/25/21 and will call back to reschedule appointment.

## 2021-08-25 ENCOUNTER — Ambulatory Visit: Payer: Medicare HMO

## 2021-08-25 ENCOUNTER — Ambulatory Visit: Payer: Medicare HMO | Admitting: Hematology & Oncology

## 2021-08-25 ENCOUNTER — Other Ambulatory Visit: Payer: Medicare HMO

## 2021-08-29 NOTE — Progress Notes (Signed)
NEUROLOGY FOLLOW UP OFFICE NOTE  Ebony Garcia [id number]  Assessment/Plan:   Migraine without aura, without status migrainosus, not intractable Daytime fatigue  Migraine prevention:  topiramate 100mg  twice daily Limit use of pain relievers to no more than 2 days out of week to prevent risk of rebound or medication-overuse headache. Keep headache diary Follow up 6 months   Subjective:  Ebony Garcia is a 70 year old right-handed woman with hypertension, CHF, B12 deficiency, depression and hyperlipidemia who follows up for migraines.   UPDATE Migraines well controlled on topiramate.  However, she has left flank pain and was found to have a kidney stone.  She also endorsed blurred vision in the left eye.  Concern for glaucoma.  She saw the eye doctor and does not have glaucoma.  She was found to have a  kidney stone.  She declined stopping topiramate as it is the only medication effective and she has either failed, had side effects or cannot afford other preventative options.   Current NSAIDS:  no Current analgesics:  no. Current triptans:   none Current anti-emetic:  Phenergan 25mg  Current muscle relaxants:  no Current anti-anxiolytic:  Xanax 1mg  Current sleep aide:  Xanax 1mg  Current Antihypertensive medications:  losartan, Lasix Current Antidepressant medications:  Prozac 20mg  Current Anticonvulsant medications:  topiramate 100mg  BID Current anti-CGRP:  none   Depression and anxiety:  Yes She reports off and on body aches, paresthesias, head pains. Reports drinking 64 oz water daily Sleep:  She says she sleeps well.  However, daytime fatigue.  Ordered sleep study.  She never received a call to schedule.   HISTORY: Onset:  Since her late 34s. Location: usually left sided, periorbital, and back of head.  More recently, holocephalic Quality:  Sharp, aching pressure.  Non-throbbing Initial intensity:  7/10; Sept: 7/10 Associated symptoms:  Nausea, photophobia and  phonophobia.  No visual disturbance, osmophobia, vomiting or unilateral numbness or weakness Aura:  no Initial duration:  3 days; Sept: 30 minutes with Maxalt Initial frequency:  25 headache days per month Triggers:  Emotional stress Relieving factors:  Cold mask, heating pack on neck and shoulders   Past NSAIDs:  naproxen (ineffective), indomethacin, toradol tablet (ineffective), Toradol 60mg  IM (effective), Cambia (side effects) Past analgesics:  Tylenol (ineffective), Excedrin (ineffective), Lidocaine nasal drops (effective but caused hives), tramadol (side effects), Midrin (effective but made her sleepy) Past triptans/ergots:  sumatriptan 100mg , Zomig 5mg  (effective), Frova, Relpax, DHE (effective), Maxalt (effective but unable to afford it), Tosymra NS, Zomig 5mg  NS Past antiemetic:  Reglan Past anxiolytics:  Vicodin Past muscle relaxants:  cyclobenzaprine Past antihypertensives:  propranolol (ineffective), atenolol (ineffective), lisinopril, metoprolol, HCTZ Past antidepressants:  amitriptyline (side effects), venlafaxine (ineffective), nortriptyline (effective but side effects such as rash), imipramine Past antiepileptics:  Depakote (hair loss), lamotrigine (side effects), zonisamide (side effects), gabapentin, Keppra Past CGRP inhibitor:  Aimovig (effective but concerned about side effects- ankle jerking, abnormal skin pattern), Ajovy (same side effects); Emgality (cost) Past vitamins and supplements:  magnesium (ineffective), feverfew (ineffective) Other past therapy:  Botox (one round, flu like symptoms, unable to afford 20% copay), occipital nerve blocks, trigger point injections, alternating heat/ice   Family history of headache:  Mother, grandmother, great-aunt, cousins.   08/16/08 MRI Brain w/wo performed for "explosive headaches": nonspecific punctate hyperintensities in the subcortical white matter. 08/16/08 MRA Head: Unremarkable.  There is mild attenuation in the  vertebrobasilar junction and proximal basilar artery which is likely artifact. 07/05/19 CTA of Head:  Unremarkable for mass lesion, high-grade arterial  stenosis, aneurysm or other acute intracranial abnormality.  PAST MEDICAL HISTORY: Past Medical History:  Diagnosis Date   Acute diastolic CHF (congestive heart failure) ([place]) 04/30/2013   1/15-72/62 acute diastolic heart failure  BNP 1528 2 D echocardiogram essentially negative  Chest x-ray revealed cardiomegaly with costophrenic angle blunting and suggestion of possible interstitial vascular accentuation; no frank heart failure present. 10 pound weight loss with 20 mg of furosemide daily  No definite etiology of the cardiomegaly or acute diastolic failure    Anxiety 02/07/2015   Auditory hallucination 10/06/2017   B12 deficiency 10/07/2018   Bradycardia 04/30/2013   Cancer ([place])    Chest pain 05/14/2020   Chest pain with moderate risk of acute coronary syndrome 02/07/2015   CHF (congestive heart failure) ([place])    dystolic heart dysfunction   Chronic kidney disease    De Quervain's tenosynovitis, left 05/23/2013   Depression    Depression with anxiety    Depressive disorder, not elsewhere classified 05/31/2007   Centricity Description: DISORDER, DEPRESSIVE NEC Qualifier: Diagnosis of  By: Linna Darner MD, [place] Octave, [place]    Diabetes mellitus without complication [facility])    Diet controlled   Dysmetabolic syndrome X 0/35/5974   Qualifier: Diagnosis of  By: Linna Darner MD, William  A1c 6.3% in 2/12; TG 278    Essential hypertension 08/09/2008   Qualifier: Diagnosis of  By: Linna Darner MD, William     Estrogen deficiency 02/02/2019   Estrogen deficiency 02/02/2019   Fatigue 02/02/2019   Hair loss 02/02/2019   Hyperlipidemia 02/27/2008   Qualifier: Diagnosis of  By: Linna Darner MD, William     Hypertension    Intertrigo    INTERTRIGO 11/08/2009   Qualifier: Diagnosis of  By: Inda Castle FNP, Melissa S    Intractable migraine with  aura without status migrainosus 08/03/2018   Migraine variant 08/15/2008   Qualifier: Diagnosis of  By: Linna Darner MD, William   Onset:initially @ age 47;recurrence age 66 during 1st trimester. Recurrence @ 39. No prodrome or aura Previously on Tryptans & Topamax with temporary relief but tolerance appeared Botox by Dr Leward Quan 09/2010  most effective     Migraines    Myalgia 02/02/2019   NONSPECIFIC ABNORMAL ELECTROCARDIOGRAM 08/15/2008   Qualifier: Diagnosis of  By: Linna Darner MD, [place]     Palpitation 04/17/2014   Panic attacks    Panic attacks    Polydipsia 11/02/2007   Qualifier: Diagnosis of  By: Linna Darner MD, [place]     RENAL CALCULUS 02/21/2007   Qualifier: Diagnosis of  By: Allen Norris     Squamous cell carcinoma in situ of skin 12/26/2015   Thrombocytopenia ([place]) 10/06/2017   Viral syndrome 04/10/2019   WEIGHT GAIN 11/02/2007   Qualifier: Diagnosis of  By: Linna Darner MD, Gwyndolyn Saxon      MEDICATIONS: Current Outpatient Medications on File Prior to Visit  Medication Sig Dispense Refill   ALPRAZolam (XANAX) 1 MG tablet TAKE 1 TABLET BY MOUTH EVERY DAY AT BEDTIME AS NEEDED FOR SLEEP 30 tablet 0   aspirin EC 81 MG tablet Take 1 tablet (81 mg total) by mouth daily. Swallow whole. 30 tablet 11   FLUoxetine (PROZAC) 20 MG capsule TAKE 1 CAPSULE EVERY DAY 90 capsule 1   furosemide (LASIX) 20 MG tablet TAKE 1 TABLET EVERY DAY 90 tablet 1   losartan (COZAAR) 50 MG tablet TAKE 1 TABLET EVERY DAY 90 tablet 1   promethazine (PHENERGAN) 25 MG tablet TAKE ONE TABLET BY MOUTH EVERY 6 HOURS  AS NEEDED FOR NAUSEA AND VOMITING 30 tablet 2   topiramate (TOPAMAX) 100 MG tablet TAKE 1 TABLET TWICE DAILY 180 tablet 1   triamcinolone ointment (KENALOG) 0.1 % Apply topically as needed     No current facility-administered medications on file prior to visit.    ALLERGIES: Allergies  Allergen Reactions   Amoxicillin-Pot Clavulanate Diarrhea and Other (See Comments)   Antihistamines, Diphenhydramine-Type Other (See  Comments)    Other Reaction: makes pt nervous   Diphenhydramine Hcl Other (See Comments)    Other Reaction: makes pt nervous   Fremanezumab-Vfrm Rash and Other (See Comments)    Seizure, jerking   Other Other (See Comments)   Tramadol Hcl Other (See Comments)   Clindamycin Other (See Comments)    She has not taken but per patient  "her mother had a reaction and she does not want it ever"   Hydrocodone-Acetaminophen Other (See Comments)    REACTION: sensitive to  but can take for extreme pain per patient   Lisinopril     Restless , irregular sleep   Nortriptyline Other (See Comments)    UNKNOWN REACTION   Propoxyphene N-Acetaminophen Nausea Only   Sulfa Antibiotics Other (See Comments)    Unknown reaction   Tramadol Other (See Comments)    UNKNOWN REACTION    FAMILY HISTORY: Family History  Problem Relation Age of Onset   Crohn's disease Mother    Atrial fibrillation Mother    Heart failure Mother    Bladder Cancer Father    Cancer Father        bladder   Bipolar disorder Brother    Cancer Brother    Bipolar disorder Sister    Heart attack Maternal Grandmother        >65   Cancer Maternal Aunt    Colon cancer Maternal Uncle    Cancer Paternal Grandfather    Cancer Maternal Aunt    Cancer Cousin    Anemia Cousin    Diabetes Neg Hx    Stroke Neg Hx       Objective:  Blood pressure 108/70, pulse 76, height 5\' 2"  (1.575 m), weight 178 lb 3.2 oz (80.8 kg), SpO2 97 %. General: No acute distress.  Patient appears well-groomed.   Head:  Normocephalic/atraumatic Eyes:  Fundi examined but not visualized Neck: supple, no paraspinal tenderness, full range of motion Heart:  Regular rate and rhythm Lungs:  Clear to auscultation bilaterally Back: No paraspinal tenderness Neurological Exam: alert and oriented to person, place, and time.  Speech fluent and not dysarthric, language intact.  CN II-XII intact. Bulk and tone normal, muscle strength 5/5 throughout.  Sensation to  light touch intact.  Deep tendon reflexes 2+ throughout, toes downgoing.  Finger to nose testing intact.  Gait normal, Romberg negative.   Metta Clines, DO  CC: Roma Schanz, DO

## 2021-09-01 ENCOUNTER — Ambulatory Visit (INDEPENDENT_AMBULATORY_CARE_PROVIDER_SITE_OTHER): Payer: Medicare HMO | Admitting: Neurology

## 2021-09-01 ENCOUNTER — Other Ambulatory Visit: Payer: Self-pay

## 2021-09-01 ENCOUNTER — Encounter: Payer: Self-pay | Admitting: Neurology

## 2021-09-01 VITALS — BP 108/70 | HR 76 | Ht 62.0 in | Wt 178.2 lb

## 2021-09-01 DIAGNOSIS — G43009 Migraine without aura, not intractable, without status migrainosus: Secondary | ICD-10-CM

## 2021-09-01 NOTE — Patient Instructions (Signed)
Continue topiramate Limit use of pain relievers to no more than 2 days out of week to prevent risk of rebound or medication-overuse headache. Keep headache diary Follow up 6 months.

## 2021-09-04 ENCOUNTER — Telehealth: Payer: Self-pay | Admitting: Family Medicine

## 2021-09-04 NOTE — Telephone Encounter (Signed)
Left message for patient to call back and schedule Medicare Annual Wellness Visit (AWV) in office.   If not able to come in office, please offer to do virtually or by telephone.  Left office number and my jabber [phone].  Last AWV:05/17/2018  Please schedule at anytime with Nurse Health Advisor.

## 2021-09-08 ENCOUNTER — Other Ambulatory Visit: Payer: Self-pay | Admitting: Family Medicine

## 2021-09-08 DIAGNOSIS — F419 Anxiety disorder, unspecified: Secondary | ICD-10-CM

## 2021-09-08 NOTE — Telephone Encounter (Signed)
Requesting: Xanax Contract: 10/18/2020 UDS: 10/18/2020 Last OV: 04/21/21 Next OV: 11/04/21 Last Refill: 07/29/21, #30--0 RF Database:   Please advise

## 2021-10-09 DIAGNOSIS — Z20822 Contact with and (suspected) exposure to covid-19: Secondary | ICD-10-CM | POA: Diagnosis not present

## 2021-10-13 ENCOUNTER — Other Ambulatory Visit: Payer: Self-pay

## 2021-10-13 ENCOUNTER — Telehealth (INDEPENDENT_AMBULATORY_CARE_PROVIDER_SITE_OTHER): Payer: Medicare HMO | Admitting: Medical

## 2021-10-13 DIAGNOSIS — B349 Viral infection, unspecified: Secondary | ICD-10-CM

## 2021-10-13 DIAGNOSIS — J0100 Acute maxillary sinusitis, unspecified: Secondary | ICD-10-CM

## 2021-10-13 DIAGNOSIS — J040 Acute laryngitis: Secondary | ICD-10-CM

## 2021-10-13 DIAGNOSIS — R058 Other specified cough: Secondary | ICD-10-CM

## 2021-10-13 DIAGNOSIS — R0981 Nasal congestion: Secondary | ICD-10-CM | POA: Diagnosis not present

## 2021-10-13 MED ORDER — BENZONATATE 100 MG PO CAPS: 100.0000 mg | ORAL_CAPSULE | Freq: Three times a day (TID) | ORAL | 0 refills | Status: DC | PRN

## 2021-10-13 MED ORDER — CEPHALEXIN 500 MG PO CAPS: 500.0000 mg | ORAL_CAPSULE | Freq: Two times a day (BID) | ORAL | 0 refills | Status: DC

## 2021-10-13 NOTE — Patient Instructions (Signed)
Viral syndrome about 2 weeks ago.  Most of the sinus symptoms have resolved except for nasal congestion, sinus pressure and minimal intermittent cough.  It is unclear whether viral syndrome 2 weeks ago was possibly COVID versus flu.  Recommend if you get viral syndrome again in the near future to test with rapid COVID test over-the-counter and notify us on result.  If negative COVID test then would do flu test.  Presently prescribing Keflex antibiotic and benzonatate for cough.  For persistent intermittent cough over the last 9 months with recent worsening cough recommend getting chest x-ray.  Order was placed.  You also report having laryngitis over the past months.  They will place referral to ENT.  However you declined a referral after I described potential laryngoscopy procedure.  If you change your mind on that referral let me know.  Follow-up in 7 to 10 days or sooner if needed.

## 2021-10-13 NOTE — Progress Notes (Signed)
Subjective:    Patient ID: Ebony Garcia, female    DOB: [birth year], 70 y.o.   MRN: [id number]  HPI  Virtual Visit via Video Note  I connected with Ebony Garcia on 10/13/21 at  4:20 PM EST by a video enabled telemedicine application and verified that I am speaking with the correct person using two identifiers.  Location: Patient:home Provider: office   I discussed the limitations of evaluation and management by telemedicine and the availability of in person appointments. The patient expressed understanding and agreed to proceed.  History of Present Illness:   Pt states for 2 weeks she was sick. Pt states she never got flu test or vaccine. She was just about to get flu test. This weekend she just got back covid test that was negative.  Early on she had st, stomach aches, chest pain, aches all over from head to toe and severe body aches.   Pt states recently had cluster ha(or migraine) for which she recently restarted prednisone. Since Friday her HA resolved.   All of pt symptoms started on her birthday.  Some residual nasal congestion and sinus pain. When blows nose will see some mucus production.   Pt also mentions even before the above illness has hoarse voice.   Observations/Objective:  General-no acute distress, pleasant, oriented. Lungs- on inspection lungs appear unlabored. Neck- no tracheal deviation or jvd on inspection. Neuro- gross motor function appears intact.    Assessment and Plan: Patient Instructions  Viral syndrome about 2 weeks ago.  Most of the sinus symptoms have resolved except for nasal congestion, sinus pressure and minimal intermittent cough.  It is unclear whether viral syndrome 2 weeks ago was possibly COVID versus flu.  Recommend if you get viral syndrome again in the near future to test with rapid COVID test over-the-counter and notify us on result.  If negative COVID test then would do flu test.  Presently prescribing Keflex  antibiotic and benzonatate for cough.  For persistent intermittent cough over the last 9 months with recent worsening cough recommend getting chest x-ray.  Order was placed.  You also report having laryngitis over the past months.  They will place referral to ENT.  However you declined a referral after I described potential laryngoscopy procedure.  If you change your mind on that referral let me know.  Follow-up in 7 to 10 days or sooner if needed.   Mackie Pai, PA-C   Follow Up Instructions:    I discussed the assessment and treatment plan with the patient. The patient was provided an opportunity to ask questions and all were answered. The patient agreed with the plan and demonstrated an understanding of the instructions.   The patient was advised to call back or seek an in-person evaluation if the symptoms worsen or if the condition fails to improve as anticipated.  Time spent with patient today was 30  minutes which consisted of chart review, discussing diagnosis, work up treatment and documentation.  Recommended to patient that she consider getting flu vaccine in about 1 month.  But waiting 3 or more months  to get COVID bivalent vaccine since she may have had covid 2 weeks ago. Explained with high level of potential antibodies from recent illness can have side effects to vaccine if given early after having illness.    Mackie Pai, PA-C   Review of Systems     Objective:   Physical Exam        Assessment & Plan:

## 2021-10-14 ENCOUNTER — Encounter: Payer: Self-pay | Admitting: Medical

## 2021-10-15 NOTE — Telephone Encounter (Signed)
Pt has an appt for 11/29

## 2021-10-16 ENCOUNTER — Telehealth: Payer: Self-pay | Admitting: Internal Medicine

## 2021-10-16 ENCOUNTER — Telehealth: Payer: Self-pay | Admitting: Medical

## 2021-10-16 ENCOUNTER — Encounter: Payer: Self-pay | Admitting: Medical

## 2021-10-16 MED ORDER — CEPHALEXIN 500 MG PO CAPS: 500.0000 mg | ORAL_CAPSULE | Freq: Two times a day (BID) | ORAL | 0 refills | Status: DC

## 2021-10-16 NOTE — Addendum Note (Signed)
Addended by: Anabel Halon on: 10/16/2021 08:05 PM   Modules accepted: Orders

## 2021-10-16 NOTE — Telephone Encounter (Signed)
Received a call from Mail order pharmacy.  They received a prescription request (from 10/13/21) for keflex and tessalon perles.  They were questioning if it was ok to refill keflex given penicillin allergy.  I reviewed the note and it appears these prescriptions were sent in to treat an acute infection.  Per allergy review, it appears that her allergy is to augmentin (with reaction - diarrhea).  Janett Billow (the pharmacy representative) stated that the prescription would take 7-10 days to be processed and shipped to the patient.  Explained that I am not sure if the prescriptions were accidentally sent to mail order. Tried to call pt to clarify allergy reaction and to clarify which pharmacy she preferred.  Unable to reach.  Left a couple of messages.  fI am forwarding this note - will need to clarify allergy and which pharmacy she prefers.

## 2021-10-16 NOTE — Telephone Encounter (Signed)
Will you call pt and let her know resent keflex to local pharmacy. I sent my chart message to her. Want to make sure she reviews. Also call mail order pharmacy and cancel the keflex I accidentally sent to them.

## 2021-10-17 NOTE — Telephone Encounter (Signed)
Pt viewed and replied to [employer] didn't have to call

## 2021-10-20 ENCOUNTER — Encounter: Payer: Self-pay | Admitting: Medical

## 2021-10-20 ENCOUNTER — Other Ambulatory Visit: Payer: Self-pay | Admitting: Family Medicine

## 2021-10-20 ENCOUNTER — Other Ambulatory Visit: Payer: Self-pay

## 2021-10-20 ENCOUNTER — Ambulatory Visit (INDEPENDENT_AMBULATORY_CARE_PROVIDER_SITE_OTHER): Payer: Medicare HMO | Admitting: Family Medicine

## 2021-10-20 ENCOUNTER — Encounter: Payer: Self-pay | Admitting: Family Medicine

## 2021-10-20 ENCOUNTER — Ambulatory Visit (HOSPITAL_BASED_OUTPATIENT_CLINIC_OR_DEPARTMENT_OTHER)
Admission: RE | Admit: 2021-10-20 | Discharge: 2021-10-20 | Disposition: A | Discharge status: Home / Self Care | Payer: Medicare HMO | Source: Ambulatory Visit | Attending: Medical | Admitting: Medical

## 2021-10-20 VITALS — BP 120/94 | HR 76 | Temp 97.9°F | Resp 18 | Ht 62.0 in | Wt 177.4 lb

## 2021-10-20 DIAGNOSIS — R053 Chronic cough: Secondary | ICD-10-CM | POA: Diagnosis not present

## 2021-10-20 DIAGNOSIS — J0140 Acute pansinusitis, unspecified: Secondary | ICD-10-CM

## 2021-10-20 DIAGNOSIS — R051 Acute cough: Secondary | ICD-10-CM | POA: Diagnosis not present

## 2021-10-20 DIAGNOSIS — R058 Other specified cough: Secondary | ICD-10-CM | POA: Diagnosis not present

## 2021-10-20 DIAGNOSIS — F419 Anxiety disorder, unspecified: Secondary | ICD-10-CM

## 2021-10-20 MED ORDER — FLUTICASONE PROPIONATE 50 MCG/ACT NA SUSP: 2.0000 | Freq: Every day | NASAL | 6 refills | Status: DC

## 2021-10-20 MED ORDER — CLARITHROMYCIN ER 500 MG PO TB24: 1000.0000 mg | ORAL_TABLET | Freq: Every day | ORAL | 0 refills | Status: DC

## 2021-10-20 NOTE — Assessment & Plan Note (Signed)
Check cxr Finish keflex  Pt will pick up tessalon today

## 2021-10-20 NOTE — Assessment & Plan Note (Signed)
Finish keflex--- if no better in the next 3-4 days switch to biaxin flonase called in Tessalon perles

## 2021-10-20 NOTE — Patient Instructions (Signed)

## 2021-10-20 NOTE — Progress Notes (Signed)
Subjective:   By signing my name below, I, Ebony Garcia, attest that this documentation has been prepared under the direction and in the presence of [name], DO. 10/20/2021     Patient ID: Ebony Garcia [place], female    DOB: March 01, 1951, 70 y.o.   MRN: [id number]  Chief Complaint  Patient presents with   Sinus Problem    X3 weeks, Pt states negative COVID test last week, Pt reports  clustered headaches, hoarness, body aches, no fever,     HPI Patient is in today for an office visit.  She came in for a virtual visit last week for symptoms of sinusitis. She was prescribed keflex and tessalon perles. She has not gotten the cough medicine from the pharmacy. Symptoms include chest pain and tightness, hoarse voice and headaches. Since last week, she mentions the migraines have reduced to headaches and are not as intense as they used to be. She mentions that prednisone taper prescribed by her neurologist helped to manage the migraines.  The prednisone was finished last week.  She started the keflex Friday.    She is also worried about mold and mildew causing the symptoms since her next-door neighbors just found mold and  mildew in their apartment.    Past Medical History:  Diagnosis Date   Acute diastolic CHF (congestive heart failure) ([place]) 04/30/2013   0/26-37/85 acute diastolic heart failure  BNP 1528 2 D echocardiogram essentially negative  Chest x-ray revealed cardiomegaly with costophrenic angle blunting and suggestion of possible interstitial vascular accentuation; no frank heart failure present. 10 pound weight loss with 20 mg of furosemide daily  No definite etiology of the cardiomegaly or acute diastolic failure    Anxiety 02/07/2015   Auditory hallucination 10/06/2017   B12 deficiency 10/07/2018   Bradycardia 04/30/2013   Cancer ([place])    Chest pain 05/14/2020   Chest pain with moderate risk of acute coronary syndrome 02/07/2015   CHF (congestive heart failure) ([place])     dystolic heart dysfunction   Chronic kidney disease    De Quervain's tenosynovitis, left 05/23/2013   Depression    Depression with anxiety    Depressive disorder, not elsewhere classified 05/31/2007   Centricity Description: DISORDER, DEPRESSIVE NEC Qualifier: Diagnosis of  By: Linna Darner MD, [place], [place]    Diabetes mellitus without complication [facility])    Diet controlled   Dysmetabolic syndrome X 8/85/0277   Qualifier: Diagnosis of  By: Linna Darner MD, [place]  A1c 6.3% in 2/12; TG 278    Essential hypertension 08/09/2008   Qualifier: Diagnosis of  By: Linna Darner MD, William     Estrogen deficiency 02/02/2019   Estrogen deficiency 02/02/2019   Fatigue 02/02/2019   Hair loss 02/02/2019   Hyperlipidemia 02/27/2008   Qualifier: Diagnosis of  By: Linna Darner MD, [place]     Hypertension    Intertrigo    INTERTRIGO 11/08/2009   Qualifier: Diagnosis of  By: Inda Castle FNP, Melissa S    Intractable migraine with aura without status migrainosus 08/03/2018   Migraine variant 08/15/2008   Qualifier: Diagnosis of  By: Linna Darner MD, William   Onset:initially @ age 41;recurrence age 74 during 1st trimester. Recurrence @ 39. No prodrome or aura Previously on Tryptans & Topamax with temporary relief but tolerance appeared Botox by Dr Leward Quan 09/2010  most effective     Migraines    Myalgia 02/02/2019   NONSPECIFIC ABNORMAL ELECTROCARDIOGRAM 08/15/2008   Qualifier: Diagnosis of  By: Linna Darner  MD, Gwyndolyn Saxon     Palpitation 04/17/2014   Panic attacks    Panic attacks    Polydipsia 11/02/2007   Qualifier: Diagnosis of  By: Linna Darner MD, [place]     RENAL CALCULUS 02/21/2007   Qualifier: Diagnosis of  By: Allen Norris     Squamous cell carcinoma in situ of skin 12/26/2015   Thrombocytopenia ([place]) 10/06/2017   Viral syndrome 04/10/2019   WEIGHT GAIN 11/02/2007   Qualifier: Diagnosis of  By: Linna Darner MD, Gwyndolyn Saxon      Past Surgical History:  Procedure Laterality Date   ABDOMINAL HYSTERECTOMY      BREAST SURGERY     DILATION AND CURETTAGE OF UTERUS     NASAL SEPTUM SURGERY     papaloma Right    TONSILLECTOMY AND ADENOIDECTOMY     TOTAL ABDOMINAL HYSTERECTOMY W/ BILATERAL SALPINGOOPHORECTOMY     for fibroids and migraines   TUBAL LIGATION      Family History  Problem Relation Age of Onset   Crohn's disease Mother    Atrial fibrillation Mother    Heart failure Mother    Bladder Cancer Father    Cancer Father        bladder   Bipolar disorder Brother    Cancer Brother    Bipolar disorder Sister    Heart attack Maternal Grandmother        >65   Cancer Maternal Aunt    Colon cancer Maternal Uncle    Cancer Paternal Grandfather    Cancer Maternal Aunt    Cancer Cousin    Anemia Cousin    Diabetes Neg Hx    Stroke Neg Hx     Social History   Socioeconomic History   Marital status: Divorced    Spouse name: Not on file   Number of children: 3   Years of education: Not on file   Highest education level: Not on file  Occupational History    Employer: UNEMPLOYED  Tobacco Use   Smoking status: Never   Smokeless tobacco: Never  Vaping Use   Vaping Use: Never used  Substance and Sexual Activity   Alcohol use: No    Alcohol/week: 0.0 standard drinks   Drug use: No   Sexual activity: Not Currently    Partners: Male  Other Topics Concern   Not on file  Social History Narrative   Lives alone.        Patient is right-handed. She lives on the first floor.      Drinks caffeine   Social Determinants of Radio broadcast assistant Strain: Not on file  Food Insecurity: Not on file  Transportation Needs: Not on file  Physical Activity: Not on file  Stress: Not on file  Social Connections: Not on file  Intimate Partner Violence: Not on file    Outpatient Medications Prior to Visit  Medication Sig Dispense Refill   ALPRAZolam (XANAX) 1 MG tablet TAKE 1 TABLET BY MOUTH EVERY DAY AT BEDTIME AS NEEDED FOR SLEEP 30 tablet 0   aspirin EC 81 MG tablet Take 1  tablet (81 mg total) by mouth daily. Swallow whole. 30 tablet 11   benzonatate (TESSALON) 100 MG capsule Take 1 capsule (100 mg total) by mouth 3 (three) times daily as needed for cough. 30 capsule 0   FLUoxetine (PROZAC) 20 MG capsule TAKE 1 CAPSULE EVERY DAY 90 capsule 1   furosemide (LASIX) 20 MG tablet TAKE 1 TABLET EVERY DAY 90 tablet 1   losartan (COZAAR)  50 MG tablet TAKE 1 TABLET EVERY DAY 90 tablet 1   promethazine (PHENERGAN) 25 MG tablet TAKE ONE TABLET BY MOUTH EVERY 6 HOURS AS NEEDED FOR NAUSEA AND VOMITING 30 tablet 2   topiramate (TOPAMAX) 100 MG tablet TAKE 1 TABLET TWICE DAILY 180 tablet 1   triamcinolone ointment (KENALOG) 0.1 % Apply topically as needed     cephALEXin (KEFLEX) 500 MG capsule Take 1 capsule (500 mg total) by mouth 2 (two) times daily. 20 capsule 0   No facility-administered medications prior to visit.    Allergies  Allergen Reactions   Amoxicillin-Pot Clavulanate Diarrhea and Other (See Comments)   Antihistamines, Diphenhydramine-Type Other (See Comments)    Other Reaction: makes pt nervous   Diphenhydramine Hcl Other (See Comments)    Other Reaction: makes pt nervous   Fremanezumab-Vfrm Rash and Other (See Comments)    Seizure, jerking   Other Other (See Comments)   Tramadol Hcl Other (See Comments)   Clindamycin Other (See Comments)    She has not taken but per patient  "her mother had a reaction and she does not want it ever"   Hydrocodone-Acetaminophen Other (See Comments)    REACTION: sensitive to  but can take for extreme pain per patient   Lisinopril     Restless , irregular sleep   Nortriptyline Other (See Comments)    UNKNOWN REACTION   Propoxyphene N-Acetaminophen Nausea Only   Sulfa Antibiotics Other (See Comments)    Unknown reaction   Tramadol Other (See Comments)    UNKNOWN REACTION    Review of Systems  Constitutional:  Negative for fever.  HENT:  Positive for congestion and sinus pain. Negative for ear pain, hearing loss and  sore throat.        (+) hoarse voice   Eyes:  Negative for blurred vision and pain.  Respiratory:  Positive for cough. Negative for sputum production, shortness of breath and wheezing.   Cardiovascular:  Positive for chest pain. Negative for palpitations.  Gastrointestinal:  Negative for blood in stool, constipation, diarrhea, nausea and vomiting.  Genitourinary:  Negative for dysuria, frequency, hematuria and urgency.  Musculoskeletal:  Negative for back pain, falls and myalgias.  Neurological:  Positive for headaches. Negative for dizziness, sensory change, loss of consciousness and weakness.  Endo/Heme/Allergies:  Negative for environmental allergies. Does not bruise/bleed easily.  Psychiatric/Behavioral:  Negative for depression and suicidal ideas. The patient is not nervous/anxious and does not have insomnia.       Objective:    Physical Exam Vitals and nursing note reviewed.  Constitutional:      General: She is not in acute distress.    Appearance: Normal appearance. She is not ill-appearing.  HENT:     Head: Normocephalic and atraumatic.     Right Ear: External ear normal.     Left Ear: External ear normal.     Nose:     Right Sinus: Maxillary sinus tenderness and frontal sinus tenderness present.     Left Sinus: Maxillary sinus tenderness and frontal sinus tenderness present.  Eyes:     Extraocular Movements: Extraocular movements intact.     Pupils: Pupils are equal, round, and reactive to light.  Cardiovascular:     Rate and Rhythm: Normal rate and regular rhythm.     Pulses: Normal pulses.     Heart sounds: Normal heart sounds. No murmur heard.   No gallop.  Pulmonary:     Effort: Pulmonary effort is normal. No respiratory distress.  Breath sounds: Normal breath sounds. No wheezing, rhonchi or rales.  Abdominal:     General: Bowel sounds are normal. There is no distension.     Palpations: Abdomen is soft. There is no mass.     Tenderness: There is no abdominal  tenderness. There is no guarding or rebound.     Hernia: No hernia is present.  Musculoskeletal:     Cervical back: Normal range of motion and neck supple.  Lymphadenopathy:     Cervical: No cervical adenopathy.  Skin:    General: Skin is warm and dry.  Neurological:     Mental Status: She is alert and oriented to person, place, and time.  Psychiatric:        Behavior: Behavior normal.    BP (!) 120/94 (BP Location: Left Arm, Patient Position: Sitting, Cuff Size: Normal)   Pulse 76   Temp 97.9 F (36.6 C) (Oral)   Resp 18   Ht 5\' 2"  (1.575 m)   Wt 177 lb 6.4 oz (80.5 kg)   SpO2 97%   BMI 32.45 kg/m  Wt Readings from Last 3 Encounters:  10/20/21 177 lb 6.4 oz (80.5 kg)  09/01/21 178 lb 3.2 oz (80.8 kg)  04/23/21 177 lb (80.3 kg)    Diabetic Foot Exam - Simple   No data filed    Lab Results  Component Value Date   WBC 6.1 04/23/2021   HGB 13.2 04/23/2021   HCT 41.4 04/23/2021   PLT 69 (L) 04/23/2021   GLUCOSE 121 (H) 04/23/2021   CHOL 212 (A) 04/23/2021   TRIG 211 (A) 04/23/2021   HDL 49 04/23/2021   LDLDIRECT 121.0 09/20/2017   LDLCALC 126 04/23/2021   ALT 14 04/23/2021   AST 15 04/23/2021   NA 142 04/23/2021   K 4.7 04/23/2021   CL 108 04/23/2021   CREATININE 1.01 (H) 04/23/2021   BUN 12 04/23/2021   CO2 28 04/23/2021   TSH 2.99 10/18/2020   INR 0.93 07/19/2010   HGBA1C 5.5 10/18/2020   MICROALBUR <0.7 04/23/2021    Lab Results  Component Value Date   TSH 2.99 10/18/2020   Lab Results  Component Value Date   WBC 6.1 04/23/2021   HGB 13.2 04/23/2021   HCT 41.4 04/23/2021   MCV 95.8 04/23/2021   PLT 69 (L) 04/23/2021   Lab Results  Component Value Date   NA 142 04/23/2021   K 4.7 04/23/2021   CO2 28 04/23/2021   GLUCOSE 121 (H) 04/23/2021   BUN 12 04/23/2021   CREATININE 1.01 (H) 04/23/2021   BILITOT 0.3 04/23/2021   ALKPHOS 84 04/23/2021   AST 15 04/23/2021   ALT 14 04/23/2021   PROT 6.8 04/23/2021   ALBUMIN 4.5 04/23/2021   CALCIUM  9.7 04/23/2021   ANIONGAP 6 04/23/2021   GFR 54.14 (L) 03/13/2020   Lab Results  Component Value Date   CHOL 212 (A) 04/23/2021   Lab Results  Component Value Date   HDL 49 04/23/2021   Lab Results  Component Value Date   LDLCALC 126 04/23/2021   Lab Results  Component Value Date   TRIG 211 (A) 04/23/2021   Lab Results  Component Value Date   CHOLHDL 4.5 10/18/2020   Lab Results  Component Value Date   HGBA1C 5.5 10/18/2020       Assessment & Plan:   Problem List Items Addressed This Visit       Unprioritized   Acute cough    Check cxr  Finish keflex  Pt will pick up tessalon today      Acute non-recurrent pansinusitis - Primary    Finish keflex--- if no better in the next 3-4 days switch to biaxin flonase called in Tessalon perles       Relevant Medications   clarithromycin (BIAXIN XL) 500 MG 24 hr tablet   fluticasone (FLONASE) 50 MCG/ACT nasal spray     Meds ordered this encounter  Medications   clarithromycin (BIAXIN XL) 500 MG 24 hr tablet    Sig: Take 2 tablets (1,000 mg total) by mouth daily.    Dispense:  20 tablet    Refill:  0   fluticasone (FLONASE) 50 MCG/ACT nasal spray    Sig: Place 2 sprays into both nostrils daily.    Dispense:  16 g    Refill:  6    I,Ebony Garcia,acting as a scribe for [employer], DO.,have documented all relevant documentation on the behalf of [name], DO,as directed by  [name], DO while in the presence of [name], DO.   I, [name], DO., personally preformed the services described in this documentation.  All medical record entries made by the scribe were at my direction and in my presence.  I have reviewed the chart and discharge instructions (if applicable) and agree that the record reflects my personal performance and is accurate and complete. 10/20/2021

## 2021-10-20 NOTE — Telephone Encounter (Signed)
Requesting: alprazolam Contract:  UDS: 10/18/20 Last Visit: 04/21/21 Next Visit: 11/04/21 Last Refill: 09/09/21  Please Advise

## 2021-11-03 ENCOUNTER — Other Ambulatory Visit: Payer: Self-pay

## 2021-11-04 ENCOUNTER — Encounter: Payer: Medicare HMO | Admitting: Family Medicine

## 2021-11-04 ENCOUNTER — Encounter: Payer: Self-pay | Admitting: Family Medicine

## 2021-11-04 ENCOUNTER — Ambulatory Visit (INDEPENDENT_AMBULATORY_CARE_PROVIDER_SITE_OTHER): Payer: Medicare HMO | Admitting: Family Medicine

## 2021-11-04 VITALS — BP 110/84 | HR 80 | Temp 98.0°F | Resp 18 | Ht 62.0 in | Wt 177.8 lb

## 2021-11-04 DIAGNOSIS — Z23 Encounter for immunization: Secondary | ICD-10-CM

## 2021-11-04 DIAGNOSIS — E782 Mixed hyperlipidemia: Secondary | ICD-10-CM | POA: Diagnosis not present

## 2021-11-04 DIAGNOSIS — I10 Essential (primary) hypertension: Secondary | ICD-10-CM | POA: Diagnosis not present

## 2021-11-04 DIAGNOSIS — E559 Vitamin D deficiency, unspecified: Secondary | ICD-10-CM | POA: Diagnosis not present

## 2021-11-04 DIAGNOSIS — R053 Chronic cough: Secondary | ICD-10-CM | POA: Diagnosis not present

## 2021-11-04 DIAGNOSIS — Z79899 Other long term (current) drug therapy: Secondary | ICD-10-CM

## 2021-11-04 DIAGNOSIS — E538 Deficiency of other specified B group vitamins: Secondary | ICD-10-CM | POA: Diagnosis not present

## 2021-11-04 DIAGNOSIS — Z0001 Encounter for general adult medical examination with abnormal findings: Secondary | ICD-10-CM

## 2021-11-04 DIAGNOSIS — I5031 Acute diastolic (congestive) heart failure: Secondary | ICD-10-CM | POA: Diagnosis not present

## 2021-11-04 DIAGNOSIS — E119 Type 2 diabetes mellitus without complications: Secondary | ICD-10-CM

## 2021-11-04 DIAGNOSIS — R3129 Other microscopic hematuria: Secondary | ICD-10-CM | POA: Diagnosis not present

## 2021-11-04 DIAGNOSIS — Z0000 Encounter for general adult medical examination without abnormal findings: Secondary | ICD-10-CM

## 2021-11-04 DIAGNOSIS — G43119 Migraine with aura, intractable, without status migrainosus: Secondary | ICD-10-CM

## 2021-11-04 DIAGNOSIS — R051 Acute cough: Secondary | ICD-10-CM

## 2021-11-04 LAB — COMPREHENSIVE METABOLIC PANEL
ALT: 13 U/L (ref 0–35)
AST: 14 U/L (ref 0–37)
Albumin: 4.2 g/dL (ref 3.5–5.2)
Alkaline Phosphatase: 69 U/L (ref 39–117)
BUN: 14 mg/dL (ref 6–23)
CO2: 23 mEq/L (ref 19–32)
Calcium: 8.8 mg/dL (ref 8.4–10.5)
Chloride: 110 mEq/L (ref 96–112)
Creatinine, Ser: 0.95 mg/dL (ref 0.40–1.20)
GFR: 61.74 mL/min (ref >60.00)
Glucose, Bld: 99 mg/dL (ref 70–99)
Potassium: 4.2 mEq/L (ref 3.5–5.1)
Sodium: 140 mEq/L (ref 135–145)
Total Bilirubin: 0.3 mg/dL (ref 0.2–1.2)
Total Protein: 6.4 g/dL (ref 6.0–8.3)

## 2021-11-04 LAB — CBC WITH DIFFERENTIAL/PLATELET
Basophils Absolute: 0 10*3/uL (ref 0.0–0.1)
Basophils Relative: 0.9 % (ref 0.0–3.0)
Eosinophils Absolute: 0.1 10*3/uL (ref 0.0–0.7)
Eosinophils Relative: 2.6 % (ref 0.0–5.0)
HCT: 40.3 % (ref 36.0–46.0)
Hemoglobin: 13.3 g/dL (ref 12.0–15.0)
Lymphocytes Relative: 25.6 % (ref 12.0–46.0)
Lymphs Abs: 1.2 10*3/uL (ref 0.7–4.0)
MCHC: 32.9 g/dL (ref 30.0–36.0)
MCV: 93.5 fl (ref 78.0–100.0)
Monocytes Absolute: 0.3 10*3/uL (ref 0.1–1.0)
Monocytes Relative: 6.3 % (ref 3.0–12.0)
Neutro Abs: 3.1 10*3/uL (ref 1.4–7.7)
Neutrophils Relative %: 64.6 % (ref 43.0–77.0)
Platelets: 68 10*3/uL — ABNORMAL LOW (ref 150.0–400.0)
RBC: 4.31 Mil/uL (ref 3.87–5.11)
RDW: 13.7 % (ref 11.5–15.5)
WBC: 4.8 10*3/uL (ref 4.0–10.5)

## 2021-11-04 LAB — LIPID PANEL
Cholesterol: 201 mg/dL — ABNORMAL HIGH (ref 0–200)
HDL: 53 mg/dL (ref >39.00)
LDL Cholesterol: 117 mg/dL — ABNORMAL HIGH (ref 0–99)
NonHDL: 148.24
Total CHOL/HDL Ratio: 4
Triglycerides: 158 mg/dL — ABNORMAL HIGH (ref 0.0–149.0)
VLDL: 31.6 mg/dL (ref 0.0–40.0)

## 2021-11-04 LAB — MICROALBUMIN / CREATININE URINE RATIO
Creatinine,U: 83.7 mg/dL
Microalb Creat Ratio: 0.8 mg/g (ref 0.0–30.0)
Microalb, Ur: <0.7 mg/dL (ref 0.0–1.9)

## 2021-11-04 LAB — VITAMIN B12: Vitamin B-12: 323 pg/mL (ref 211–911)

## 2021-11-04 LAB — VITAMIN D 25 HYDROXY (VIT D DEFICIENCY, FRACTURES): VITD: 44.83 ng/mL (ref 30.00–100.00)

## 2021-11-04 NOTE — Assessment & Plan Note (Signed)
Encourage heart healthy diet such as MIND or DASH diet, increase exercise, avoid trans fats, simple carbohydrates and processed foods, consider a krill or fish or flaxseed oil cap daily.  °

## 2021-11-04 NOTE — Progress Notes (Signed)
Subjective:     Ebony Garcia is a 70 y.o. female and is here for a comprehensive physical exam. The patient reports problems - hoarseness and chronic cough  . She never took ppi or pepcid due to not liking the side effects and she still does not.  Cxr was normal -- done last nov  Social History   Socioeconomic History   Marital status: Divorced    Spouse name: Not on file   Number of children: 3   Years of education: Not on file   Highest education level: Not on file  Occupational History    Employer: UNEMPLOYED  Tobacco Use   Smoking status: Never   Smokeless tobacco: Never  Vaping Use   Vaping Use: Never used  Substance and Sexual Activity   Alcohol use: No    Alcohol/week: 0.0 standard drinks   Drug use: No   Sexual activity: Not Currently    Partners: Male  Other Topics Concern   Not on file  Social History Narrative   Lives alone.        Patient is right-handed. She lives on the first floor.      Drinks caffeine   Social Determinants of Health   Financial Resource Strain: Not on file  Food Insecurity: Not on file  Transportation Needs: Not on file  Physical Activity: Not on file  Stress: Not on file  Social Connections: Not on file  Intimate Partner Violence: Not on file   Health Maintenance  Topic Date Due   FOOT EXAM  Never done   OPHTHALMOLOGY EXAM  Never done   Zoster Vaccines- Shingrix (1 of 2) Never done   COLONOSCOPY (Pts 45-80yrs Insurance coverage will need to be confirmed)  12/12/2014   MAMMOGRAM  09/10/2017   DEXA SCAN  Never done   TETANUS/TDAP  11/09/2019   Pneumonia Vaccine 78+ Years old (2 - PPSV23 if available, else PCV20) 08/02/2020   COVID-19 Vaccine (4 - Booster for Moderna series) 11/29/2020   HEMOGLOBIN A1C  10/24/2021   INFLUENZA VACCINE  Completed   Hepatitis C Screening  Completed   HPV VACCINES  Aged Out    The following portions of the patient's history were reviewed and updated as appropriate: She  has a past medical  history of Acute diastolic CHF (congestive heart failure) ([place]) (04/30/2013), Anxiety (02/07/2015), Auditory hallucination (10/06/2017), B12 deficiency (10/07/2018), Bradycardia (04/30/2013), Cancer ([place]), Chest pain (05/14/2020), Chest pain with moderate risk of acute coronary syndrome (02/07/2015), CHF (congestive heart failure) ([place]), Chronic kidney disease, De Quervain's tenosynovitis, left (05/23/2013), Depression, Depression with anxiety, Depressive disorder, not elsewhere classified (05/31/2007), Diabetes mellitus without complication ([place]), Dysmetabolic syndrome X (8/84/1660), Essential hypertension (08/09/2008), Estrogen deficiency (02/02/2019), Estrogen deficiency (02/02/2019), Fatigue (02/02/2019), Hair loss (02/02/2019), Hyperlipidemia (02/27/2008), Hypertension, Intertrigo, INTERTRIGO (11/08/2009), Intractable migraine with aura without status migrainosus (08/03/2018), Migraine variant (08/15/2008), Migraines, Myalgia (02/02/2019), NONSPECIFIC ABNORMAL ELECTROCARDIOGRAM (08/15/2008), Palpitation (04/17/2014), Panic attacks, Panic attacks, Polydipsia (11/02/2007), RENAL CALCULUS (02/21/2007), Squamous cell carcinoma in situ of skin (12/26/2015), Thrombocytopenia ([place]) (10/06/2017), Viral syndrome (04/10/2019), and WEIGHT GAIN (11/02/2007). She does not have any pertinent problems on file. She  has a past surgical history that includes Total abdominal hysterectomy w/ bilateral salpingoophorectomy; Tonsillectomy and adenoidectomy; Nasal septum surgery; Tubal ligation; Dilation and curettage of uterus; papaloma (Right); Breast surgery; and Abdominal hysterectomy. Her family history includes Anemia in her cousin; Atrial fibrillation in her mother; Bipolar disorder in her brother and sister; Bladder Cancer in her father; Cancer in her brother, cousin, father,  maternal aunt, maternal aunt, and paternal grandfather; Colon cancer in her maternal uncle; Crohn's disease in her mother; Heart attack in her maternal grandmother; Heart failure  in her mother. She  reports that she has never smoked. She has never used smokeless tobacco. She reports that she does not drink alcohol and does not use drugs. She has a current medication list which includes the following prescription(s): alprazolam, aspirin ec, benzonatate, fluoxetine, fluticasone, furosemide, losartan, promethazine, topiramate, and triamcinolone ointment. Current Outpatient Medications on File Prior to Visit  Medication Sig Dispense Refill   ALPRAZolam (XANAX) 1 MG tablet TAKE 1 TABLET BY MOUTH EVERY DAY AT BEDTIME AS NEEDED FOR SLEEP 30 tablet 0   aspirin EC 81 MG tablet Take 1 tablet (81 mg total) by mouth daily. Swallow whole. 30 tablet 11   benzonatate (TESSALON) 100 MG capsule Take 1 capsule (100 mg total) by mouth 3 (three) times daily as needed for cough. 30 capsule 0   FLUoxetine (PROZAC) 20 MG capsule TAKE 1 CAPSULE EVERY DAY 90 capsule 1   fluticasone (FLONASE) 50 MCG/ACT nasal spray Place 2 sprays into both nostrils daily. 16 g 6   furosemide (LASIX) 20 MG tablet TAKE 1 TABLET EVERY DAY 90 tablet 1   losartan (COZAAR) 50 MG tablet TAKE 1 TABLET EVERY DAY 90 tablet 1   promethazine (PHENERGAN) 25 MG tablet TAKE ONE TABLET BY MOUTH EVERY 6 HOURS AS NEEDED FOR NAUSEA AND VOMITING 30 tablet 2   topiramate (TOPAMAX) 100 MG tablet TAKE 1 TABLET TWICE DAILY 180 tablet 1   triamcinolone ointment (KENALOG) 0.1 % Apply topically as needed     No current facility-administered medications on file prior to visit.   She is allergic to amoxicillin-pot clavulanate; antihistamines, diphenhydramine-type; diphenhydramine hcl; fremanezumab-vfrm; other; tramadol hcl; clindamycin; hydrocodone-acetaminophen; lisinopril; nortriptyline; propoxyphene n-acetaminophen; sulfa antibiotics; and tramadol..  Review of Systems Review of Systems  Constitutional: Negative for activity change, appetite change and fatigue.  HENT: Negative for hearing loss, congestion, tinnitus and ear discharge.   dentist q72m Eyes: Negative for visual disturbance (see optho q1y -- vision corrected to 20/20 with glasses).  Respiratory: Negative for  chest tightness and shortness of breath.  + cough Cardiovascular: Negative for chest pain, palpitations and leg swelling.  Gastrointestinal: Negative for abdominal pain, diarrhea, constipation and abdominal distention.  Genitourinary: Negative for urgency, frequency, decreased urine volume and difficulty urinating.  Musculoskeletal: Negative for back pain, arthralgias and gait problem.  Skin: Negative for color change, pallor and rash.  Neurological: Negative for dizziness, light-headedness, numbness and headaches.  Hematological: Negative for adenopathy. Does not bruise/bleed easily.  Psychiatric/Behavioral: Negative for suicidal ideas, confusion, sleep disturbance, self-injury, dysphoric mood, decreased concentration and agitation.      Objective:    BP 110/84 (BP Location: Left Arm, Patient Position: Sitting, Cuff Size: Normal)   Pulse 80   Temp 98 F (36.7 C) (Oral)   Resp 18   Ht 5\' 2"  (1.575 m)   Wt 177 lb 12.8 oz (80.6 kg)   SpO2 98%   BMI 32.52 kg/m  General appearance: alert, cooperative, appears stated age, and no distress Head: Normocephalic, without obvious abnormality, atraumatic Eyes: negative findings: lids and lashes normal, conjunctivae and sclerae normal, and pupils equal, round, reactive to light and accomodation Ears: normal TM's and external ear canals both ears Neck: no adenopathy, no carotid bruit, no JVD, supple, symmetrical, trachea midline, and thyroid not enlarged, symmetric, no tenderness/mass/nodules Back: symmetric, no curvature. ROM normal. No CVA tenderness. Lungs: clear  to auscultation bilaterally Heart: regular rate and rhythm, S1, S2 normal, no murmur, click, rub or gallop Abdomen: soft, non-tender; bowel sounds normal; no masses,  no organomegaly Extremities: extremities normal, atraumatic, no cyanosis or  edema Pulses: 2+ and symmetric Skin: Skin color, texture, turgor normal. No rashes or lesions Lymph nodes: Cervical, supraclavicular, and axillary nodes normal. Neurologic: Alert and oriented X 3, normal strength and tone. Normal symmetric reflexes. Normal coordination and gait    Assessment:    Healthy female exam.      Plan:     See After Visit Summary for Counseling Recommendations

## 2021-11-04 NOTE — Assessment & Plan Note (Signed)
Check labs today.

## 2021-11-04 NOTE — Assessment & Plan Note (Signed)
Stable

## 2021-11-04 NOTE — Assessment & Plan Note (Addendum)
cxr normal Pt refuses ppi/ pepcid Refer to pulmonary

## 2021-11-04 NOTE — Assessment & Plan Note (Signed)
Well controlled, no changes to meds. Encouraged heart healthy diet such as the DASH diet and exercise as tolerated.  °

## 2021-11-04 NOTE — Patient Instructions (Signed)
Preventive Care 40 Years and Older, Female Preventive care refers to lifestyle choices and visits with your health care provider that can promote health and wellness. Preventive care visits are also called wellness exams. What can I expect for my preventive care visit? Counseling Your health care provider may ask you questions about your: Medical history, including: Past medical problems. Family medical history. Pregnancy and menstrual history. History of falls. Current health, including: Memory and ability to understand (cognition). Emotional well-being. Home life and relationship well-being. Sexual activity and sexual health. Lifestyle, including: Alcohol, nicotine or tobacco, and drug use. Access to firearms. Diet, exercise, and sleep habits. Work and work Statistician. Sunscreen use. Safety issues such as seatbelt and bike helmet use. Physical exam Your health care provider will check your: Height and weight. These may be used to calculate your BMI (body mass index). BMI is a measurement that tells if you are at a healthy weight. Waist circumference. This measures the distance around your waistline. This measurement also tells if you are at a healthy weight and may help predict your risk of certain diseases, such as type 2 diabetes and high blood pressure. Heart rate and blood pressure. Body temperature. Skin for abnormal spots. What immunizations do I need? Vaccines are usually given at various ages, according to a schedule. Your health care provider will recommend vaccines for you based on your age, medical history, and lifestyle or other factors, such as travel or where you work. What tests do I need? Screening Your health care provider may recommend screening tests for certain conditions. This may include: Lipid and cholesterol levels. Hepatitis C test. Hepatitis B test. HIV (human immunodeficiency virus) test. STI (sexually transmitted infection) testing, if you are at  risk. Lung cancer screening. Colorectal cancer screening. Diabetes screening. This is done by checking your blood sugar (glucose) after you have not eaten for a while (fasting). Mammogram. Talk with your health care provider about how often you should have regular mammograms. BRCA-related cancer screening. This may be done if you have a family history of breast, ovarian, tubal, or peritoneal cancers. Bone density scan. This is done to screen for osteoporosis. Talk with your health care provider about your test results, treatment options, and if necessary, the need for more tests. Follow these instructions at home: Eating and drinking  Eat a diet that includes fresh fruits and vegetables, whole grains, lean protein, and low-fat dairy products. Limit your intake of foods with high amounts of sugar, saturated fats, and salt. Take vitamin and mineral supplements as recommended by your health care provider. Do not drink alcohol if your health care provider tells you not to drink. If you drink alcohol: Limit how much you have to 0-1 drink a day. Know how much alcohol is in your drink. In the [place] one drink equals one 12 oz bottle of beer (355 mL), one 5 oz glass of wine (148 mL), or one 1 oz glass of hard liquor (44 mL). Lifestyle Brush your teeth every morning and night with fluoride toothpaste. Floss one time each day. Exercise for at least 30 minutes 5 or more days each week. Do not use any products that contain nicotine or tobacco. These products include cigarettes, chewing tobacco, and vaping devices, such as e-cigarettes. If you need help quitting, ask your health care provider. Do not use drugs. If you are sexually active, practice safe sex. Use a condom or other form of protection in order to prevent STIs. Take aspirin only as told by your  health care provider. Make sure that you understand how much to take and what form to take. Work with your health care provider to find out whether it  is safe and beneficial for you to take aspirin daily. Ask your health care provider if you need to take a cholesterol-lowering medicine (statin). Find healthy ways to manage stress, such as: Meditation, yoga, or listening to music. Journaling. Talking to a trusted person. Spending time with friends and family. Minimize exposure to UV radiation to reduce your risk of skin cancer. Safety Always wear your seat belt while driving or riding in a vehicle. Do not drive: If you have been drinking alcohol. Do not ride with someone who has been drinking. When you are tired or distracted. While texting. If you have been using any mind-altering substances or drugs. Wear a helmet and other protective equipment during sports activities. If you have firearms in your house, make sure you follow all gun safety procedures. What's next? Visit your health care provider once a year for an annual wellness visit. Ask your health care provider how often you should have your eyes and teeth checked. Stay up to date on all vaccines. This information is not intended to replace advice given to you by your health care provider. Make sure you discuss any questions you have with your health care provider. Document Revised: 05/21/2021 Document Reviewed: 05/21/2021 Elsevier Patient Education  [place].

## 2021-11-04 NOTE — Assessment & Plan Note (Signed)
Per neuro 

## 2021-11-05 LAB — HEMOGLOBIN A1C: Hgb A1c MFr Bld: 5.8 % (ref 4.6–6.5)

## 2021-11-07 LAB — DRUG MONITORING PANEL 376104, URINE

## 2021-11-07 LAB — DM TEMPLATE

## 2021-11-11 ENCOUNTER — Telehealth: Payer: Self-pay | Admitting: Family

## 2021-11-18 ENCOUNTER — Ambulatory Visit (INDEPENDENT_AMBULATORY_CARE_PROVIDER_SITE_OTHER): Payer: Medicare HMO | Admitting: Pulmonary Disease

## 2021-11-18 ENCOUNTER — Encounter: Payer: Self-pay | Admitting: Pulmonary Disease

## 2021-11-18 ENCOUNTER — Other Ambulatory Visit: Payer: Self-pay

## 2021-11-18 VITALS — BP 126/80 | HR 67 | Ht 62.0 in | Wt 176.0 lb

## 2021-11-18 DIAGNOSIS — R053 Chronic cough: Secondary | ICD-10-CM | POA: Diagnosis not present

## 2021-11-18 MED ORDER — AZELASTINE HCL 0.1 % NA SOLN: 1.0000 | Freq: Two times a day (BID) | NASAL | 12 refills | Status: DC

## 2021-11-18 NOTE — Patient Instructions (Addendum)
Nice to meet you  Use Flonase 1 spray each nostril twice a day for 7 days then use 1 spray each nostril thereafter   Use azelastine 1 spray each nostril twice a day  If cough does not improve we will go to plan B  Return to clinic in 3 months with Dr. Silas Flood

## 2021-11-19 NOTE — Progress Notes (Signed)
@Patient  ID: Ebony Garcia, female    DOB: 09-27-1951, 70 y.o.   MRN: [id number]  Chief Complaint  Patient presents with   Consult    Referred by PCP for history of cough. States she believes the cough is coming from her migraine medication. Non-productive cough.     Referring provider: [name], *  HPI:   70 y.o. woman whom we are seeing in consultation for evaluation of chronic cough.  PCP note x2 reviewed.  Patient reports several history of cough.  Daily.  Worse over the last 3 to 4 months or so.  Often dry.  Occasional bouts of scant sputum.  No times a day with things are better or worse.  No position that makes things better or worse.  No environmental seasonal factors she can identify that make things better or worse.  No real alleviating or exacerbating factors.  Recently prescribed Tessalon Perles without relief.  Recently treated for sinus infection x2 with 2 different antibiotics per PCP.  This did help with sinus pressure, mild improvement in congestion but no change in the cough.  She endorses some atopic symptoms, seasonal allergies.  No rashes.  She describes intense bandlike pressure and pain sometimes in the evenings that I perceive as being GERD.  She thinks this is anxiety as she has had a lot of grief recently.  Review chest x-ray 10/20/2021 on my review interpretation reveals clear lungs bilaterally.  PMH: Seasonal allergies, anxiety, hypertension Surgical history: Hysterectomy, breast surgery, nasal septum surgery Family history: Mother was diagnosed with likely lung cancer, heart failure, A. fib, father with bladder cancer Social history: Never smoker, lives in [place], estranged from children   Questionaires / Pulmonary Flowsheets:   ACT:  No flowsheet data found.  MMRC: No flowsheet data found.  Epworth:  No flowsheet data found.  Tests:   FENO:  No results found for: NITRICOXIDE  PFT: No flowsheet data found.  WALK:  No  flowsheet data found.  Imaging: Personally reviewed as per EMR discussion this note DG Chest 2 View  Result Date: 10/20/2021 CLINICAL DATA:  Chronic cough EXAM: CHEST - 2 VIEW COMPARISON:  Chest x-ray dated November 11, 2020 FINDINGS: Cardiac and mediastinal contours are within normal limits. Lungs are clear. No pleural effusion pneumothorax. IMPRESSION: No active cardiopulmonary disease. Electronically Signed   By: Yetta Glassman M.D.   On: 10/20/2021 17:24    Lab Results: Personally reviewed CBC    Component Value Date/Time   WBC 4.8 11/04/2021 1114   RBC 4.31 11/04/2021 1114   HGB 13.3 11/04/2021 1114   HGB 13.2 04/23/2021 1346   HGB 14.4 11/11/2017 1339   HCT 40.3 11/04/2021 1114   HCT 44.0 11/11/2017 1339   PLT 68.0 (L) 11/04/2021 1114   PLT 69 (L) 04/23/2021 1346   PLT 105 Platelet count consistent in citrate (L) 11/11/2017 1339   MCV 93.5 11/04/2021 1114   MCV 94 11/11/2017 1339   MCH 30.6 04/23/2021 1346   MCHC 32.9 11/04/2021 1114   RDW 13.7 11/04/2021 1114   RDW 13.6 11/11/2017 1339   LYMPHSABS 1.2 11/04/2021 1114   LYMPHSABS 1.6 11/11/2017 1339   MONOABS 0.3 11/04/2021 1114   EOSABS 0.1 11/04/2021 1114   EOSABS 0.2 11/11/2017 1339   BASOSABS 0.0 11/04/2021 1114   BASOSABS 0.0 11/11/2017 1339    BMET    Component Value Date/Time   NA 140 11/04/2021 1114   NA 143 11/11/2017 1339   K 4.2 11/04/2021  1114   K 3.6 11/11/2017 1339   CL 110 11/04/2021 1114   CL 109 (H) 11/11/2017 1339   CO2 23 11/04/2021 1114   CO2 24 11/11/2017 1339   GLUCOSE 99 11/04/2021 1114   GLUCOSE 132 (H) 11/11/2017 1339   BUN 14 11/04/2021 1114   BUN 9 11/11/2017 1339   CREATININE 0.95 11/04/2021 1114   CREATININE 1.01 (H) 04/23/2021 1346   CREATININE 1.00 (H) 10/18/2020 1433   CALCIUM 8.8 11/04/2021 1114   CALCIUM 9.3 11/11/2017 1339   GFRNONAA >60 04/23/2021 1346   GFRAA >60 07/01/2020 1337    BNP    Component Value Date/Time   BNP 74.7 11/03/2016 1810    ProBNP     Component Value Date/Time   PROBNP 40.0 05/10/2013 1627    Specialty Problems       Pulmonary Problems   Acute non-recurrent pansinusitis   Chronic cough    Allergies  Allergen Reactions   Amoxicillin-Pot Clavulanate Diarrhea and Other (See Comments)   Antihistamines, Diphenhydramine-Type Other (See Comments)    Other Reaction: makes pt nervous   Diphenhydramine Hcl Other (See Comments)    Other Reaction: makes pt nervous   Fremanezumab-Vfrm Rash and Other (See Comments)    Seizure, jerking   Other Other (See Comments)   Tramadol Hcl Other (See Comments)   Clindamycin Other (See Comments)    She has not taken but per patient  "her mother had a reaction and she does not want it ever"   Hydrocodone-Acetaminophen Other (See Comments)    REACTION: sensitive to  but can take for extreme pain per patient   Lisinopril     Restless , irregular sleep   Nortriptyline Other (See Comments)    UNKNOWN REACTION   Propoxyphene N-Acetaminophen Nausea Only   Sulfa Antibiotics Other (See Comments)    Unknown reaction   Tramadol Other (See Comments)    UNKNOWN REACTION    Immunization History  Administered Date(s) Administered   Fluad Quad(high Dose 65+) 08/03/2019, 11/04/2021   Hepatitis B, adult 07/30/2016, 09/29/2016   Influenza Split 09/19/2012   Influenza Whole 11/08/2009   Influenza,inj,Quad PF,6+ Mos 11/07/2013, 07/30/2016, 09/20/2017, 09/14/2018   Influenza-Unspecified 09/11/2015   Moderna Sars-Covid-2 Vaccination 02/16/2020, 03/18/2020, 10/04/2020   PPD Test 07/30/2016, 11/03/2017   Pneumococcal Conjugate-13 08/03/2019   Td 11/08/2009    Past Medical History:  Diagnosis Date   Acute diastolic CHF (congestive heart failure) ([place]) 04/30/2013   4/31-54/00 acute diastolic heart failure  BNP 1528 2 D echocardiogram essentially negative  Chest x-ray revealed cardiomegaly with costophrenic angle blunting and suggestion of possible interstitial vascular accentuation; no frank  heart failure present. 10 pound weight loss with 20 mg of furosemide daily  No definite etiology of the cardiomegaly or acute diastolic failure    Anxiety 02/07/2015   Auditory hallucination 10/06/2017   B12 deficiency 10/07/2018   Bradycardia 04/30/2013   Cancer ([place])    Chest pain 05/14/2020   Chest pain with moderate risk of acute coronary syndrome 02/07/2015   CHF (congestive heart failure) ([place])    dystolic heart dysfunction   Chronic kidney disease    De Quervain's tenosynovitis, left 05/23/2013   Depression    Depression with anxiety    Depressive disorder, not elsewhere classified 05/31/2007   Centricity Description: DISORDER, DEPRESSIVE NEC Qualifier: Diagnosis of  By: Linna Darner MD, [place] Oct[place], [place]    Diabetes mellitus without complication [facility])    Diet controlled   Dysmetabolic  syndrome X 02/27/2008   Qualifier: Diagnosis of  By: Linna Darner MD, [place]  A1c 6.3% in 2/12; TG 278    Essential hypertension 08/09/2008   Qualifier: Diagnosis of  By: Linna Darner MD, [place]     Estrogen deficiency 02/02/2019   Estrogen deficiency 02/02/2019   Fatigue 02/02/2019   Hair loss 02/02/2019   Hyperlipidemia 02/27/2008   Qualifier: Diagnosis of  By: Linna Darner MD, [place]     Hypertension    Intertrigo    INTERTRIGO 11/08/2009   Qualifier: Diagnosis of  By: Inda Castle FNP, Melissa S    Intractable migraine with aura without status migrainosus 08/03/2018   Migraine variant 08/15/2008   Qualifier: Diagnosis of  By: Linna Darner MD, Gwyndolyn Saxon   Onset:initially @ age 22;recurrence age 71 during 1st trimester. Recurrence @ 39. No prodrome or aura Previously on Tryptans & Topamax with temporary relief but tolerance appeared Botox by Dr Leward Quan 09/2010  most effective     Migraines    Myalgia 02/02/2019   NONSPECIFIC ABNORMAL ELECTROCARDIOGRAM 08/15/2008   Qualifier: Diagnosis of  By: Linna Darner MD, [place]     Palpitation 04/17/2014   Panic attacks    Panic attacks    Polydipsia 11/02/2007    Qualifier: Diagnosis of  By: Linna Darner MD, [place]     RENAL CALCULUS 02/21/2007   Qualifier: Diagnosis of  By: Allen Norris     Squamous cell carcinoma in situ of skin 12/26/2015   Thrombocytopenia ([place]) 10/06/2017   Viral syndrome 04/10/2019   WEIGHT GAIN 11/02/2007   Qualifier: Diagnosis of  By: Linna Darner MD, Gwyndolyn Saxon      Tobacco History: Social History   Tobacco Use  Smoking Status Never  Smokeless Tobacco Never   Counseling given: Not Answered   Continue to not smoke  Outpatient Encounter Medications as of 11/18/2021  Medication Sig   ALPRAZolam (XANAX) 1 MG tablet TAKE 1 TABLET BY MOUTH EVERY DAY AT BEDTIME AS NEEDED FOR SLEEP   aspirin EC 81 MG tablet Take 1 tablet (81 mg total) by mouth daily. Swallow whole.   azelastine (ASTELIN) 0.1 % nasal spray Place 1 spray into both nostrils 2 (two) times daily. Use in each nostril as directed   benzonatate (TESSALON) 100 MG capsule Take 1 capsule (100 mg total) by mouth 3 (three) times daily as needed for cough.   FLUoxetine (PROZAC) 20 MG capsule TAKE 1 CAPSULE EVERY DAY   fluticasone (FLONASE) 50 MCG/ACT nasal spray Place 2 sprays into both nostrils daily.   furosemide (LASIX) 20 MG tablet TAKE 1 TABLET EVERY DAY   losartan (COZAAR) 50 MG tablet TAKE 1 TABLET EVERY DAY   promethazine (PHENERGAN) 25 MG tablet TAKE ONE TABLET BY MOUTH EVERY 6 HOURS AS NEEDED FOR NAUSEA AND VOMITING   topiramate (TOPAMAX) 100 MG tablet TAKE 1 TABLET TWICE DAILY   triamcinolone ointment (KENALOG) 0.1 % Apply topically as needed   No facility-administered encounter medications on file as of 11/18/2021.     Review of Systems  Review of Systems  No chest pain with exertion.  No orthopnea or PND.  Comprehensive review of systems otherwise negative. Physical Exam  BP 126/80    Pulse 67    Ht 5\' 2"  (1.575 m)    Wt 176 lb (79.8 kg)    SpO2 100% Comment: on RA   BMI 32.19 kg/m   Wt Readings from Last 5 Encounters:  11/18/21 176 lb (79.8 kg)   11/04/21 177 lb 12.8 oz (80.6 kg)  10/20/21 177 lb 6.4 oz (  80.5 kg)  09/01/21 178 lb 3.2 oz (80.8 kg)  04/23/21 177 lb (80.3 kg)    BMI Readings from Last 5 Encounters:  11/18/21 32.19 kg/m  11/04/21 32.52 kg/m  10/20/21 32.45 kg/m  09/01/21 32.59 kg/m  04/23/21 32.37 kg/m     Physical Exam General: Well-appearing, no acute distress Eyes: EOMI, icterus Neck: Supple, no JVP Pulmonary: Clear, no work of breathing Cardiovascular: Regular in rhythm, no murmur Abdomen: Nondistended, bowel sounds present MSK: No synovitis, no joint effusion Neuro: Normal gait, no weakness Psych: Normal mood, full affect   Assessment & Plan:   Chronic cough: Present for many years.  Likely multifactorial.  No clear driver.  She has longstanding what she describes as sinus disease, sinus congestion.  She seems hesitant to really do any medications for the cough.  Advised nasal regimen Flonase, azelastine.  She describes some GERD symptoms which frankly is my high suspicion given the dry nature of her cough.  Her PCP documents resistance to GERD medications she was not very receptive to this today.  Given her nasal issues, possible cough variant asthma.  Could consider inhaler therapies in the future.  Would reengage and encourage PPI if increased nasal regimen is unsuccessful.  Given her description of multiple sinus infections recently, ENT referral would be reasonable in the future.  Sinusitis: Clinical diagnosis made by PCP.  That is post 2 rounds of antibiotics with mild improvement.  Consider ENT referral in the future.   Return in about 3 months (around 02/16/2022).   Lanier Clam, MD 11/19/2021

## 2021-11-20 ENCOUNTER — Ambulatory Visit: Payer: Medicare HMO

## 2021-11-20 ENCOUNTER — Inpatient Hospital Stay: Payer: Medicare HMO

## 2021-11-21 ENCOUNTER — Other Ambulatory Visit: Payer: Self-pay

## 2021-11-21 ENCOUNTER — Other Ambulatory Visit: Payer: Self-pay | Admitting: Family

## 2021-11-21 ENCOUNTER — Inpatient Hospital Stay: Payer: Medicare HMO | Attending: Hematology & Oncology

## 2021-11-21 ENCOUNTER — Inpatient Hospital Stay: Payer: Medicare HMO

## 2021-11-21 VITALS — BP 117/86 | HR 80 | Temp 98.4°F | Resp 17

## 2021-11-21 DIAGNOSIS — E538 Deficiency of other specified B group vitamins: Secondary | ICD-10-CM

## 2021-11-21 DIAGNOSIS — Z7982 Long term (current) use of aspirin: Secondary | ICD-10-CM | POA: Insufficient documentation

## 2021-11-21 DIAGNOSIS — D696 Thrombocytopenia, unspecified: Secondary | ICD-10-CM

## 2021-11-21 DIAGNOSIS — Z79899 Other long term (current) drug therapy: Secondary | ICD-10-CM | POA: Insufficient documentation

## 2021-11-21 LAB — CMP (CANCER CENTER ONLY)
ALT: 12 U/L (ref 0–44)
AST: 14 U/L — ABNORMAL LOW (ref 15–41)
Albumin: 4.3 g/dL (ref 3.5–5.0)
Alkaline Phosphatase: 80 U/L (ref 38–126)
Anion gap: 8 (ref 5–15)
BUN: 14 mg/dL (ref 8–23)
CO2: 28 mmol/L (ref 22–32)
Calcium: 9.7 mg/dL (ref 8.9–10.3)
Chloride: 109 mmol/L (ref 98–111)
Creatinine: 1.11 mg/dL — ABNORMAL HIGH (ref 0.44–1.00)
GFR, Estimated: 54 mL/min — ABNORMAL LOW (ref >60)
Glucose, Bld: 125 mg/dL — ABNORMAL HIGH (ref 70–99)
Potassium: 4.3 mmol/L (ref 3.5–5.1)
Sodium: 145 mmol/L (ref 135–145)
Total Bilirubin: 0.3 mg/dL (ref 0.3–1.2)
Total Protein: 6.9 g/dL (ref 6.5–8.1)

## 2021-11-21 LAB — CBC WITH DIFFERENTIAL (CANCER CENTER ONLY)
Abs Immature Granulocytes: 0.01 10*3/uL (ref 0.00–0.07)
Basophils Absolute: 0.1 10*3/uL (ref 0.0–0.1)
Basophils Relative: 1 %
Eosinophils Absolute: 0.1 10*3/uL (ref 0.0–0.5)
Eosinophils Relative: 2 %
HCT: 42 % (ref 36.0–46.0)
Hemoglobin: 13.4 g/dL (ref 12.0–15.0)
Immature Granulocytes: 0 %
Lymphocytes Relative: 32 %
Lymphs Abs: 1.7 10*3/uL (ref 0.7–4.0)
MCH: 30.7 pg (ref 26.0–34.0)
MCHC: 31.9 g/dL (ref 30.0–36.0)
MCV: 96.3 fL (ref 80.0–100.0)
Monocytes Absolute: 0.3 10*3/uL (ref 0.1–1.0)
Monocytes Relative: 6 %
Neutro Abs: 3.1 10*3/uL (ref 1.7–7.7)
Neutrophils Relative %: 59 %
Platelet Count: 91 10*3/uL — ABNORMAL LOW (ref 150–400)
RBC: 4.36 MIL/uL (ref 3.87–5.11)
RDW: 13.2 % (ref 11.5–15.5)
WBC Count: 5.2 10*3/uL (ref 4.0–10.5)
nRBC: 0 % (ref 0.0–0.2)

## 2021-11-21 LAB — VITAMIN B12: Vitamin B-12: 400 pg/mL (ref 180–914)

## 2021-11-21 MED ORDER — CYANOCOBALAMIN 1000 MCG/ML IJ SOLN
1000.0000 ug | Freq: Once | INTRAMUSCULAR | Status: AC
  Administered 2021-11-21: 1000 ug via INTRAMUSCULAR
  Filled 2021-11-21: qty 1

## 2021-11-21 NOTE — Patient Instructions (Signed)

## 2021-11-24 ENCOUNTER — Encounter: Payer: Self-pay | Admitting: Pulmonary Disease

## 2021-11-24 ENCOUNTER — Telehealth: Payer: Self-pay | Admitting: Pulmonary Disease

## 2021-11-24 MED ORDER — IPRATROPIUM BROMIDE 0.03 % NA SOLN: 2.0000 | Freq: Two times a day (BID) | NASAL | 12 refills | Status: DC

## 2021-11-24 NOTE — Telephone Encounter (Signed)
Fax received from pt's pharmacy stating that the azeleastine 0.1% nasal spray is on manf backorder. Dr. Silas Flood, please advise about this what you recommend.

## 2021-11-24 NOTE — Telephone Encounter (Signed)
Ipratropium nasal spray 2 spray each nostril twice daily sent to pharmacy.

## 2021-11-24 NOTE — Telephone Encounter (Signed)
Attempted to call pt to let her know that the Rx had been sent to pharmacy for her but unable to reach. Left pt a detailed message letting her know about this. Nothing further needed.

## 2021-11-26 ENCOUNTER — Other Ambulatory Visit: Payer: Self-pay | Admitting: Family Medicine

## 2021-11-26 DIAGNOSIS — F419 Anxiety disorder, unspecified: Secondary | ICD-10-CM

## 2021-11-27 NOTE — Telephone Encounter (Signed)
Requesting: Alprazolam 1 mg Contract:  UDS: 11/04/2021 Last Visit:  011/29/2022 Next Visit: 05/05/2022 Last Refill: 10/20/2021, #30 x 0RF  Please Advise

## 2022-01-05 ENCOUNTER — Other Ambulatory Visit: Payer: Self-pay | Admitting: Family Medicine

## 2022-01-05 DIAGNOSIS — F419 Anxiety disorder, unspecified: Secondary | ICD-10-CM

## 2022-01-05 NOTE — Telephone Encounter (Signed)
Requesting: alprazolam 1mg   Contract: 11/04/2021 UDS: 11/04/2021 Last Visit: 11/04/2021 Next Visit: 05/05/2022 Last Refill: 11/27/2021 #30 and 0RF  Please Advise

## 2022-01-08 ENCOUNTER — Encounter: Payer: Self-pay | Admitting: Pulmonary Disease

## 2022-01-08 NOTE — Telephone Encounter (Signed)
Update: I am doing great!  I don't remember the last bad headache or migraine, and I am sleeping a minimum of 9 hours every night.  I do still have my cough.  Some days are worse than others.  MUST be the Topamax and/or my Losartan Potassium.  So, I am not concerned, just annoying.  Thank you for making SUCH A DIFFERENCE!!   HAPPY CAMPER!! Tiphani

## 2022-01-22 ENCOUNTER — Other Ambulatory Visit: Payer: Self-pay | Admitting: Neurology

## 2022-01-26 ENCOUNTER — Telehealth: Payer: Self-pay | Admitting: Family Medicine

## 2022-01-26 NOTE — Telephone Encounter (Signed)
Left message for patient to call back and schedule Medicare Annual Wellness Visit (AWV) in office.   If not able to come in office, please offer to do virtually or by telephone.  Left office number and my jabber [phone].  Last AWV:05/17/2018  Please schedule at anytime with Nurse Health Advisor.

## 2022-01-26 NOTE — Telephone Encounter (Signed)
Number given to pt to call and get scheduled.

## 2022-01-30 ENCOUNTER — Ambulatory Visit (INDEPENDENT_AMBULATORY_CARE_PROVIDER_SITE_OTHER): Payer: Medicare HMO

## 2022-01-30 VITALS — Ht 62.0 in | Wt 176.0 lb

## 2022-01-30 DIAGNOSIS — Z0000 Encounter for general adult medical examination without abnormal findings: Secondary | ICD-10-CM

## 2022-01-30 NOTE — Patient Instructions (Signed)
Ebony Garcia , Thank you for taking time to complete your Medicare Wellness Visit. I appreciate your ongoing commitment to your health goals. Please review the following plan we discussed and let me know if I can assist you in the future.   Screening recommendations/referrals: Colonoscopy: Due-Per our conversation, you will call GI when you are ready to schedule. Mammogram: Due-Declined today. Please call when you are ready to schedule. Bone Density: Due Declined today. Please call the office when you are ready to schedule. Recommended yearly ophthalmology/optometry visit for glaucoma screening and checkup Recommended yearly dental visit for hygiene and checkup  Vaccinations: Influenza vaccine: Up to date Pneumococcal vaccine: Due-May obtain vaccine at our office or your local pharmacy. Tdap vaccine: Due-May obtain vaccine at your local pharmacy. Shingles vaccine: First dose completed   Covid-19:Booster available at the pharmacy.  Advanced directives: Copy in chart  Conditions/risks identified: See problem list  Next appointment: Follow up in one year for your annual wellness visit    Preventive Care 69 Years and Older, Female Preventive care refers to lifestyle choices and visits with your health care provider that can promote health and wellness. What does preventive care include? A yearly physical exam. This is also called an annual well check. Dental exams once or twice a year. Routine eye exams. Ask your health care provider how often you should have your eyes checked. Personal lifestyle choices, including: Daily care of your teeth and gums. Regular physical activity. Eating a healthy diet. Avoiding tobacco and drug use. Limiting alcohol use. Practicing safe sex. Taking low-dose aspirin every day. Taking vitamin and mineral supplements as recommended by your health care provider. What happens during an annual well check? The services and screenings done by your health  care provider during your annual well check will depend on your age, overall health, lifestyle risk factors, and family history of disease. Counseling  Your health care provider may ask you questions about your: Alcohol use. Tobacco use. Drug use. Emotional well-being. Home and relationship well-being. Sexual activity. Eating habits. History of falls. Memory and ability to understand (cognition). Work and work Statistician. Reproductive health. Screening  You may have the following tests or measurements: Height, weight, and BMI. Blood pressure. Lipid and cholesterol levels. These may be checked every 5 years, or more frequently if you are over 26 years old. Skin check. Lung cancer screening. You may have this screening every year starting at age 18 if you have a 30-pack-year history of smoking and currently smoke or have quit within the past 15 years. Fecal occult blood test (FOBT) of the stool. You may have this test every year starting at age 35. Flexible sigmoidoscopy or colonoscopy. You may have a sigmoidoscopy every 5 years or a colonoscopy every 10 years starting at age 50. Hepatitis C blood test. Hepatitis B blood test. Sexually transmitted disease (STD) testing. Diabetes screening. This is done by checking your blood sugar (glucose) after you have not eaten for a while (fasting). You may have this done every 1-3 years. Bone density scan. This is done to screen for osteoporosis. You may have this done starting at age 53. Mammogram. This may be done every 1-2 years. Talk to your health care provider about how often you should have regular mammograms. Talk with your health care provider about your test results, treatment options, and if necessary, the need for more tests. Vaccines  Your health care provider may recommend certain vaccines, such as: Influenza vaccine. This is recommended every year. Tetanus, diphtheria,  and acellular pertussis (Tdap, Td) vaccine. You may need a Td  booster every 10 years. Zoster vaccine. You may need this after age 73. Pneumococcal 13-valent conjugate (PCV13) vaccine. One dose is recommended after age 61. Pneumococcal polysaccharide (PPSV23) vaccine. One dose is recommended after age 43. Talk to your health care provider about which screenings and vaccines you need and how often you need them. This information is not intended to replace advice given to you by your health care provider. Make sure you discuss any questions you have with your health care provider. Document Released: 12/20/2015 Document Revised: 08/12/2016 Document Reviewed: 09/24/2015 Elsevier Interactive Patient Education  2017 [employer] Prevention in the Home Falls can cause injuries. They can happen to people of all ages. There are many things you can do to make your home safe and to help prevent falls. What can I do on the outside of my home? Regularly fix the edges of walkways and driveways and fix any cracks. Remove anything that might make you trip as you walk through a door, such as a raised step or threshold. Trim any bushes or trees on the path to your home. Use bright outdoor lighting. Clear any walking paths of anything that might make someone trip, such as rocks or tools. Regularly check to see if handrails are loose or broken. Make sure that both sides of any steps have handrails. Any raised decks and porches should have guardrails on the edges. Have any leaves, snow, or ice cleared regularly. Use sand or salt on walking paths during winter. Clean up any spills in your garage right away. This includes oil or grease spills. What can I do in the bathroom? Use night lights. Install grab bars by the toilet and in the tub and shower. Do not use towel bars as grab bars. Use non-skid mats or decals in the tub or shower. If you need to sit down in the shower, use a plastic, non-slip stool. Keep the floor dry. Clean up any water that spills on the floor  as soon as it happens. Remove soap buildup in the tub or shower regularly. Attach bath mats securely with double-sided non-slip rug tape. Do not have throw rugs and other things on the floor that can make you trip. What can I do in the bedroom? Use night lights. Make sure that you have a light by your bed that is easy to reach. Do not use any sheets or blankets that are too big for your bed. They should not hang down onto the floor. Have a firm chair that has side arms. You can use this for support while you get dressed. Do not have throw rugs and other things on the floor that can make you trip. What can I do in the kitchen? Clean up any spills right away. Avoid walking on wet floors. Keep items that you use a lot in easy-to-reach places. If you need to reach something above you, use a strong step stool that has a grab bar. Keep electrical cords out of the way. Do not use floor polish or wax that makes floors slippery. If you must use wax, use non-skid floor wax. Do not have throw rugs and other things on the floor that can make you trip. What can I do with my stairs? Do not leave any items on the stairs. Make sure that there are handrails on both sides of the stairs and use them. Fix handrails that are broken or loose. Make sure  that handrails are as long as the stairways. Check any carpeting to make sure that it is firmly attached to the stairs. Fix any carpet that is loose or worn. Avoid having throw rugs at the top or bottom of the stairs. If you do have throw rugs, attach them to the floor with carpet tape. Make sure that you have a light switch at the top of the stairs and the bottom of the stairs. If you do not have them, ask someone to add them for you. What else can I do to help prevent falls? Wear shoes that: Do not have high heels. Have rubber bottoms. Are comfortable and fit you well. Are closed at the toe. Do not wear sandals. If you use a stepladder: Make sure that it is  fully opened. Do not climb a closed stepladder. Make sure that both sides of the stepladder are locked into place. Ask someone to hold it for you, if possible. Clearly mark and make sure that you can see: Any grab bars or handrails. First and last steps. Where the edge of each step is. Use tools that help you move around (mobility aids) if they are needed. These include: Canes. Walkers. Scooters. Crutches. Turn on the lights when you go into a dark area. Replace any light bulbs as soon as they burn out. Set up your furniture so you have a clear path. Avoid moving your furniture around. If any of your floors are uneven, fix them. If there are any pets around you, be aware of where they are. Review your medicines with your doctor. Some medicines can make you feel dizzy. This can increase your chance of falling. Ask your doctor what other things that you can do to help prevent falls. This information is not intended to replace advice given to you by your health care provider. Make sure you discuss any questions you have with your health care provider. Document Released: 09/19/2009 Document Revised: 04/30/2016 Document Reviewed: 12/28/2014 Elsevier Interactive Patient Education  2017 [employer].

## 2022-01-30 NOTE — Progress Notes (Signed)
Subjective:   Ebony Garcia is a 71 y.o. female who presents for Medicare Annual (Subsequent) preventive examination.  I connected with Shevaun today by telephone and verified that I am speaking with the correct person using two identifiers. Location patient: home Location provider: work Persons participating in the virtual visit: patient, Marine scientist.    I discussed the limitations, risks, security and privacy concerns of performing an evaluation and management service by telephone and the availability of in person appointments. I also discussed with the patient that there may be a patient responsible charge related to this service. The patient expressed understanding and verbally consented to this telephonic visit.    Interactive audio and video telecommunications were attempted between this provider and patient, however failed, due to patient having technical difficulties OR patient did not have access to video capability.  We continued and completed visit with audio only.  Some vital signs may be absent or patient reported.   Time Spent with patient on telephone encounter: 20 minutes   Review of Systems     Cardiac Risk Factors include: advanced age (>29men, >70 women);hypertension;diabetes mellitus;dyslipidemia;obesity (BMI >30kg/m2);sedentary lifestyle     Objective:    Today's Vitals   01/30/22 1502  Weight: 176 lb (79.8 kg)  Height: 5\' 2"  (1.575 m)   Body mass index is 32.19 kg/m.  Advanced Directives 01/30/2022 04/23/2021 02/17/2021 01/02/2021 12/11/2020 07/01/2020 04/01/2020  Does Patient Have a Medical Advance Directive? Yes Yes Yes;No Yes Yes Yes Yes  Type of Advance Directive Living will Living will - [place];Living will [address];Living will [place];Living will Living will  Does patient want to make changes to medical advance directive? - No - Patient declined - No - Patient declined - No - Patient declined No -  Patient declined  Copy of Healthcare Power of Attorney in Chart? - - - No - copy requested No - copy requested No - copy requested -  Would patient like information on creating a medical advance directive? - - - No - Patient declined No - Patient declined No - Patient declined -    Current Medications (verified) Outpatient Encounter Medications as of 01/30/2022  Medication Sig   ALPRAZolam (XANAX) 1 MG tablet TAKE 1 TABLET BY MOUTH EVERY DAY AT BEDTIME AS NEEDED FOR SLEEP   aspirin EC 81 MG tablet Take 1 tablet (81 mg total) by mouth daily. Swallow whole.   azelastine (ASTELIN) 0.1 % nasal spray Place 1 spray into both nostrils 2 (two) times daily. Use in each nostril as directed   FLUoxetine (PROZAC) 20 MG capsule TAKE 1 CAPSULE EVERY DAY   fluticasone (FLONASE) 50 MCG/ACT nasal spray Place 2 sprays into both nostrils daily.   furosemide (LASIX) 20 MG tablet TAKE 1 TABLET EVERY DAY   losartan (COZAAR) 50 MG tablet TAKE 1 TABLET EVERY DAY   topiramate (TOPAMAX) 100 MG tablet TAKE 1 TABLET TWICE DAILY   triamcinolone ointment (KENALOG) 0.1 % Apply topically as needed   benzonatate (TESSALON) 100 MG capsule Take 1 capsule (100 mg total) by mouth 3 (three) times daily as needed for cough. (Patient not taking: Reported on 01/30/2022)   ipratropium (ATROVENT) 0.03 % nasal spray Place 2 sprays into both nostrils every 12 (twelve) hours.   promethazine (PHENERGAN) 25 MG tablet TAKE ONE TABLET BY MOUTH EVERY 6 HOURS AS NEEDED FOR NAUSEA AND VOMITING (Patient not taking: Reported on 01/30/2022)   No facility-administered encounter medications on file as of  01/30/2022.    Allergies (verified) Amoxicillin-pot clavulanate; Antihistamines, diphenhydramine-type; Diphenhydramine hcl; Fremanezumab-vfrm; Other; Tramadol hcl; Clindamycin; Hydrocodone-acetaminophen; Lisinopril; Nortriptyline; Propoxyphene n-acetaminophen; Sulfa antibiotics; and Tramadol   History: Past Medical History:  Diagnosis Date   Acute  diastolic CHF (congestive heart failure) ([place]) 04/30/2013   4/31-54/00 acute diastolic heart failure  BNP 1528 2 D echocardiogram essentially negative  Chest x-ray revealed cardiomegaly with costophrenic angle blunting and suggestion of possible interstitial vascular accentuation; no frank heart failure present. 10 pound weight loss with 20 mg of furosemide daily  No definite etiology of the cardiomegaly or acute diastolic failure    Anxiety 02/07/2015   Auditory hallucination 10/06/2017   B12 deficiency 10/07/2018   Bradycardia 04/30/2013   Cancer ([place])    Chest pain 05/14/2020   Chest pain with moderate risk of acute coronary syndrome 02/07/2015   CHF (congestive heart failure) ([place])    dystolic heart dysfunction   Chronic kidney disease    De Quervain's tenosynovitis, left 05/23/2013   Depression    Depression with anxiety    Depressive disorder, not elsewhere classified 05/31/2007   Centricity Description: DISORDER, DEPRESSIVE NEC Qualifier: Diagnosis of  By: Linna Darner MD, [place] Octave, [place]    Diabetes mellitus without complication [facility])    Diet controlled   Dysmetabolic syndrome X 8/67/6195   Qualifier: Diagnosis of  By: Linna Darner MD, William  A1c 6.3% in 2/12; TG 278    Essential hypertension 08/09/2008   Qualifier: Diagnosis of  By: Linna Darner MD, William     Estrogen deficiency 02/02/2019   Estrogen deficiency 02/02/2019   Fatigue 02/02/2019   Hair loss 02/02/2019   Hyperlipidemia 02/27/2008   Qualifier: Diagnosis of  By: Linna Darner MD, William     Hypertension    Intertrigo    INTERTRIGO 11/08/2009   Qualifier: Diagnosis of  By: Inda Castle FNP, Melissa S    Intractable migraine with aura without status migrainosus 08/03/2018   Migraine variant 08/15/2008   Qualifier: Diagnosis of  By: Linna Darner MD, William   Onset:initially @ age 60;recurrence age 86 during 1st trimester. Recurrence @ 39. No prodrome or aura Previously on Tryptans & Topamax with temporary relief but  tolerance appeared Botox by Dr Leward Quan 09/2010  most effective     Migraines    Myalgia 02/02/2019   NONSPECIFIC ABNORMAL ELECTROCARDIOGRAM 08/15/2008   Qualifier: Diagnosis of  By: Linna Darner MD, William     Palpitation 04/17/2014   Panic attacks    Panic attacks    Polydipsia 11/02/2007   Qualifier: Diagnosis of  By: Linna Darner MD, [place]     RENAL CALCULUS 02/21/2007   Qualifier: Diagnosis of  By: Allen Norris     Squamous cell carcinoma in situ of skin 12/26/2015   Thrombocytopenia ([place]) 10/06/2017   Viral syndrome 04/10/2019   WEIGHT GAIN 11/02/2007   Qualifier: Diagnosis of  By: Linna Darner MD, Gwyndolyn Saxon     Past Surgical History:  Procedure Laterality Date   ABDOMINAL HYSTERECTOMY     BREAST SURGERY     DILATION AND CURETTAGE OF UTERUS     NASAL SEPTUM SURGERY     papaloma Right    TONSILLECTOMY AND ADENOIDECTOMY     TOTAL ABDOMINAL HYSTERECTOMY W/ BILATERAL SALPINGOOPHORECTOMY     for fibroids and migraines   TUBAL LIGATION     Family History  Problem Relation Age of Onset   Crohn's disease Mother    Atrial fibrillation Mother    Heart failure Mother    Bladder  Cancer Father    Cancer Father        bladder   Bipolar disorder Brother    Cancer Brother    Bipolar disorder Sister    Heart attack Maternal Grandmother        >65   Cancer Maternal Aunt    Colon cancer Maternal Uncle    Cancer Paternal Grandfather    Cancer Maternal Aunt    Cancer Cousin    Anemia Cousin    Diabetes Neg Hx    Stroke Neg Hx    Social History   Socioeconomic History   Marital status: Divorced    Spouse name: Not on file   Number of children: 3   Years of education: Not on file   Highest education level: Not on file  Occupational History    Employer: UNEMPLOYED  Tobacco Use   Smoking status: Never   Smokeless tobacco: Never  Vaping Use   Vaping Use: Never used  Substance and Sexual Activity   Alcohol use: No    Alcohol/week: 0.0 standard drinks   Drug use: No   Sexual activity:  Not Currently    Partners: Male  Other Topics Concern   Not on file  Social History Narrative   Lives alone.        Patient is right-handed. She lives on the first floor.      Drinks caffeine   Social Determinants of Radio broadcast assistant Strain: Low Risk    Difficulty of Paying Living Expenses: Not hard at all  Food Insecurity: No Food Insecurity   Worried About Charity fundraiser in the Last Year: Never true   Arboriculturist in the Last Year: Never true  Transportation Needs: No Transportation Needs   Lack of Transportation (Medical): No   Lack of Transportation (Non-Medical): No  Physical Activity: Inactive   Days of Exercise per Week: 0 days   Minutes of Exercise per Session: 0 min  Stress: No Stress Concern Present   Feeling of Stress : Not at all  Social Connections: Socially Isolated   Frequency of Communication with Friends and Family: More than three times a week   Frequency of Social Gatherings with Friends and Family: Twice a week   Attends Religious Services: Never   Marine scientist or Organizations: No   Attends Music therapist: Never   Marital Status: Divorced    Tobacco Counseling Counseling given: Not Answered   Clinical Intake:  Pre-visit preparation completed: Yes  Pain : No/denies pain     BMI - recorded: 32.19 Nutritional Status: BMI > 30  Obese Nutritional Risks: None Diabetes: Yes CBG done?: No Did pt. bring in CBG monitor from home?: No (phone visit)  How often do you need to have someone help you when you read instructions, pamphlets, or other written materials from your doctor or pharmacy?: 1 - Never  Diabetes:  Is the patient diabetic?  Yes  If diabetic, was a CBG obtained today?  No  Did the patient bring in their glucometer from home?  No phone visit How often do you monitor your CBG's? never.   Financial Strains and Diabetes Management:  Are you having any financial strains with the device, your  supplies or your medication? No .  Does the patient want to be seen by Chronic Care Management for management of their diabetes?  No  Would the patient like to be referred to a Nutritionist or for Diabetic Management?  No   Diabetic Exams:  Diabetic Eye Exam:. Overdue for diabetic eye exam. Pt has been advised about the importance in completing this exam.   Diabetic Foot Exam: Pt has been advised about the importance in completing this exam.To be completed by PCP   Interpreter Needed?: No  Information entered by :: Caroleen Hamman LPN   Activities of Daily Living In your present state of health, do you have any difficulty performing the following activities: 01/30/2022 02/24/2021  Hearing? N N  Vision? N N  Difficulty concentrating or making decisions? N N  Walking or climbing stairs? Y N  Comment stairs due to knee pain -  Dressing or bathing? N N  Doing errands, shopping? N N  Preparing Food and eating ? N -  Using the Toilet? N -  In the past six months, have you accidently leaked urine? N -  Do you have problems with loss of bowel control? N -  Managing your Medications? N -  Managing your Finances? N -  Housekeeping or managing your Housekeeping? N -  Some recent data might be hidden    Patient Care Team: Carollee Herter, Alferd Apa, DO as PCP - General (Family Medicine) Janann August, MD as Referring Physician (Dermatology) Aloha Gell, MD as Consulting Physician (Obstetrics and Gynecology) Pieter Partridge, DO as Consulting Physician (Neurology) [place], Jennefer Bravo, MD as Consulting Physician (Dermatology) Minus Breeding, MD as Consulting Physician (Cardiology) Karie Georges, OD as Consulting Physician (Optometry) Bunnie Domino, LPN (Inactive) as Licensed Practical Nurse  Indicate any recent Medical Services you may have received from other than [facility] providers in the past year (date may be approximate).     Assessment:   This is a routine wellness examination  for Menan.  Hearing/Vision screen Hearing Screening - Comments:: No issues Vision Screening - Comments:: Last eye exam-2022-Dr. Hutto  Dietary issues and exercise activities discussed: Current Exercise Habits: The patient does not participate in regular exercise at present   Goals Addressed             This Visit's Progress    Increase physical activity   Not on track    Walk 30 minutes daily 5 days/week.       Depression Screen PHQ 2/9 Scores 01/30/2022 11/04/2021 02/25/2020 05/17/2018 09/20/2017 05/14/2017 11/03/2016  PHQ - 2 Score 1 0 0 2 2 6 6   PHQ- 9 Score - - 0 8 8 13 16     Fall Risk Fall Risk  01/30/2022 02/17/2021 08/19/2020 02/09/2020 06/12/2019  Falls in the past year? 0 0 1 0 0  Number falls in past yr: 0 0 0 0 -  Injury with Fall? 0 0 1 0 -  Follow up Falls prevention discussed - - - Falls evaluation completed    FALL RISK PREVENTION PERTAINING TO THE HOME:  Any stairs in or around the home? Yes  Home free of loose throw rugs in walkways, pet beds, electrical cords, etc? Yes  Adequate lighting in your home to reduce risk of falls? Yes   ASSISTIVE DEVICES UTILIZED TO PREVENT FALLS:  Life alert? No  Use of a cane, walker or w/c? No  Grab bars in the bathroom? No  Shower chair or bench in shower? No  Elevated toilet seat or a handicapped toilet? No   TIMED UP AND GO:  Was the test performed? No . Phone visit   Cognitive Function:Normal cognitive status assessed by this Nurse Health Advisor. No abnormalities found.   MMSE - Mini  Mental State Exam 05/14/2017  Orientation to time 5  Orientation to Place 5  Registration 3  Attention/ Calculation 5  Recall 3  Language- name 2 objects 2  Language- repeat 1  Language- follow 3 step command 3  Language- read & follow direction 1  Write a sentence 1  Copy design 1  Total score 30        Immunizations Immunization History  Administered Date(s) Administered   Fluad Quad(high Dose 65+) 08/03/2019, 11/04/2021    Hepatitis B, adult 07/30/2016, 09/29/2016   Influenza Split 09/19/2012   Influenza Whole 11/08/2009   Influenza,inj,Quad PF,6+ Mos 11/07/2013, 07/30/2016, 09/20/2017, 09/14/2018   Influenza-Unspecified 09/11/2015   Moderna Sars-Covid-2 Vaccination 02/16/2020, 03/18/2020, 10/04/2020   PPD Test 07/30/2016, 11/03/2017   Pneumococcal Conjugate-13 08/03/2019   Td 11/08/2009   Zoster Recombinat (Shingrix) 01/17/2022    TDAP status: Due, Education has been provided regarding the importance of this vaccine. Advised may receive this vaccine at local pharmacy or Health Dept. Aware to provide a copy of the vaccination record if obtained from local pharmacy or Health Dept. Verbalized acceptance and understanding.  Flu Vaccine status: Up to date  Pneumococcal vaccine status: Due, Education has been provided regarding the importance of this vaccine. Advised may receive this vaccine at local pharmacy or Health Dept. Aware to provide a copy of the vaccination record if obtained from local pharmacy or Health Dept. Verbalized acceptance and understanding.  Covid-19 vaccine status: Information provided on how to obtain vaccines.   Qualifies for Shingles Vaccine? Yes   Zostavax completed No   Shingrix Completed?: No.    Education has been provided regarding the importance of this vaccine. Patient has been advised to call insurance company to determine out of pocket expense if they have not yet received this vaccine. Advised may also receive vaccine at local pharmacy or Health Dept. Verbalized acceptance and understanding.  Screening Tests Health Maintenance  Topic Date Due   FOOT EXAM  Never done   OPHTHALMOLOGY EXAM  Never done   COLONOSCOPY (Pts 45-74yrs Insurance coverage will need to be confirmed)  12/12/2014   MAMMOGRAM  09/10/2017   DEXA SCAN  Never done   TETANUS/TDAP  11/09/2019   Pneumonia Vaccine 46+ Years old (2 - PPSV23 if available, else PCV20) 08/02/2020   COVID-19 Vaccine (4 - Booster  for Moderna series) 11/29/2020   Zoster Vaccines- Shingrix (2 of 2) 03/14/2022   HEMOGLOBIN A1C  05/04/2022   INFLUENZA VACCINE  Completed   Hepatitis C Screening  Completed   HPV VACCINES  Aged Out    Health Maintenance  Health Maintenance Due  Topic Date Due   FOOT EXAM  Never done   OPHTHALMOLOGY EXAM  Never done   COLONOSCOPY (Pts 45-60yrs Insurance coverage will need to be confirmed)  12/12/2014   MAMMOGRAM  09/10/2017   DEXA SCAN  Never done   TETANUS/TDAP  11/09/2019   Pneumonia Vaccine 6+ Years old (2 - PPSV23 if available, else PCV20) 08/02/2020   COVID-19 Vaccine (4 - Booster for Moderna series) 11/29/2020    Colorectal cancer screening: Due-Declined today. Patient states she will call GI when she is ready to schedule.  Mammogram status: Declined  Bone Density status: Declined  Lung Cancer Screening: (Low Dose CT Chest recommended if Age 51-80 years, 30 pack-year currently smoking OR have quit w/in 15years.) does not qualify.     Additional Screening:  Hepatitis C Screening: Completed 05/14/2017  Vision Screening: Recommended annual ophthalmology exams for early detection of  glaucoma and other disorders of the eye. Is the patient up to date with their annual eye exam?  Yes  Who is the provider or what is the name of the office in which the patient attends annual eye exams? Dr. Laban Emperor   Dental Screening: Recommended annual dental exams for proper oral hygiene  Community Resource Referral / Chronic Care Management: CRR required this visit?  No   CCM required this visit?  No      Plan:     I have personally reviewed and noted the following in the patients chart:   Medical and social history Use of alcohol, tobacco or illicit drugs  Current medications and supplements including opioid prescriptions.  Functional ability and status Nutritional status Physical activity Advanced directives List of other physicians Hospitalizations, surgeries, and ER  visits in previous 12 months Vitals Screenings to include cognitive, depression, and falls Referrals and appointments  In addition, I have reviewed and discussed with patient certain preventive protocols, quality metrics, and best practice recommendations. A written personalized care plan for preventive services as well as general preventive health recommendations were provided to patient.   Due to this being a telephonic visit, the after visit summary with patients personalized plan was offered to patient via mail or my-chart.  Patient would like to access on my-chart.   Marta Antu, LPN   8/32/9191  Nurse Health Advisor  Nurse Notes: None

## 2022-02-02 ENCOUNTER — Other Ambulatory Visit: Payer: Self-pay

## 2022-02-02 ENCOUNTER — Encounter: Payer: Self-pay | Admitting: Family

## 2022-02-02 ENCOUNTER — Inpatient Hospital Stay: Payer: Medicare HMO

## 2022-02-02 ENCOUNTER — Inpatient Hospital Stay (HOSPITAL_BASED_OUTPATIENT_CLINIC_OR_DEPARTMENT_OTHER): Payer: Medicare HMO | Admitting: Family

## 2022-02-02 ENCOUNTER — Inpatient Hospital Stay: Payer: Medicare HMO | Attending: Hematology & Oncology

## 2022-02-02 VITALS — BP 114/86 | HR 70 | Temp 98.2°F | Resp 18 | Wt 178.4 lb

## 2022-02-02 DIAGNOSIS — E538 Deficiency of other specified B group vitamins: Secondary | ICD-10-CM

## 2022-02-02 DIAGNOSIS — D696 Thrombocytopenia, unspecified: Secondary | ICD-10-CM

## 2022-02-02 DIAGNOSIS — D6959 Other secondary thrombocytopenia: Secondary | ICD-10-CM | POA: Diagnosis not present

## 2022-02-02 LAB — CMP (CANCER CENTER ONLY)
ALT: 13 U/L (ref 0–44)
AST: 15 U/L (ref 15–41)
Albumin: 4.1 g/dL (ref 3.5–5.0)
Alkaline Phosphatase: 80 U/L (ref 38–126)
Anion gap: 8 (ref 5–15)
BUN: 12 mg/dL (ref 8–23)
CO2: 25 mmol/L (ref 22–32)
Calcium: 9 mg/dL (ref 8.9–10.3)
Chloride: 107 mmol/L (ref 98–111)
Creatinine: 1.15 mg/dL — ABNORMAL HIGH (ref 0.44–1.00)
GFR, Estimated: 52 mL/min — ABNORMAL LOW (ref >60)
Glucose, Bld: 160 mg/dL — ABNORMAL HIGH (ref 70–99)
Potassium: 4.3 mmol/L (ref 3.5–5.1)
Sodium: 140 mmol/L (ref 135–145)
Total Bilirubin: 0.4 mg/dL (ref 0.3–1.2)
Total Protein: 6.8 g/dL (ref 6.5–8.1)

## 2022-02-02 LAB — CBC WITH DIFFERENTIAL (CANCER CENTER ONLY)
Abs Immature Granulocytes: 0.02 10*3/uL (ref 0.00–0.07)
Basophils Absolute: 0.1 10*3/uL (ref 0.0–0.1)
Basophils Relative: 1 %
Eosinophils Absolute: 0.2 10*3/uL (ref 0.0–0.5)
Eosinophils Relative: 2 %
HCT: 42.6 % (ref 36.0–46.0)
Hemoglobin: 13.5 g/dL (ref 12.0–15.0)
Immature Granulocytes: 0 %
Lymphocytes Relative: 23 %
Lymphs Abs: 1.4 10*3/uL (ref 0.7–4.0)
MCH: 30.2 pg (ref 26.0–34.0)
MCHC: 31.7 g/dL (ref 30.0–36.0)
MCV: 95.3 fL (ref 80.0–100.0)
Monocytes Absolute: 0.4 10*3/uL (ref 0.1–1.0)
Monocytes Relative: 6 %
Neutro Abs: 4.2 10*3/uL (ref 1.7–7.7)
Neutrophils Relative %: 68 %
Platelet Count: 70 10*3/uL — ABNORMAL LOW (ref 150–400)
RBC: 4.47 MIL/uL (ref 3.87–5.11)
RDW: 13 % (ref 11.5–15.5)
WBC Count: 6.2 10*3/uL (ref 4.0–10.5)
nRBC: 0 % (ref 0.0–0.2)

## 2022-02-02 LAB — VITAMIN B12: Vitamin B-12: 293 pg/mL (ref 180–914)

## 2022-02-02 MED ORDER — CYANOCOBALAMIN 1000 MCG/ML IJ SOLN
1000.0000 ug | Freq: Once | INTRAMUSCULAR | Status: AC
  Administered 2022-02-02: 1000 ug via INTRAMUSCULAR
  Filled 2022-02-02: qty 1

## 2022-02-02 NOTE — Patient Instructions (Signed)

## 2022-02-02 NOTE — Progress Notes (Signed)
Hematology and Oncology Follow Up Visit  Ebony Garcia [id number] Mar 05, 1951 71 y.o. 02/02/2022   Principle Diagnosis:  Medication induced thrombocytopenia B 12 deficiency    Current Therapy:        Observation B 12 injection monthly   Interim History:  [name] is here today for follow-up and B 12 injection. She is doing quite well but still notes persistent fatigue.  No fever, chills, n/v, cough, rash, dizziness, SOB, chest pain, palpitations, abdominal pain or changes in bowel or bladder habits at this time. She notes occasional episodes of chest discomfort and palpitations.   No blood loss noted. No bruising or petechiae.  No swelling in her extremities.  She notes numbness and tingling in her hands and feet unchanged from baseline.  No falls or syncope to report.  Appetite is ok and she is staying well hydrated. Her weight stable at 178 lbs.   ECOG Performance Status: 1 - Symptomatic but completely ambulatory  Medications:  Allergies as of 02/02/2022       Reactions   Amoxicillin-pot Clavulanate Diarrhea, Other (See Comments)   Antihistamines, Diphenhydramine-type Other (See Comments)   Other Reaction: makes pt nervous   Diphenhydramine Hcl Other (See Comments)   Other Reaction: makes pt nervous   Fremanezumab-vfrm Rash, Other (See Comments)   Seizure, jerking   Other Other (See Comments)   Tramadol Hcl Other (See Comments)   Clindamycin Other (See Comments)   She has not taken but per patient  "her mother had a reaction and she does not want it ever"   Hydrocodone-acetaminophen Other (See Comments)   REACTION: sensitive to  but can take for extreme pain per patient   Lisinopril    Restless , irregular sleep   Nortriptyline Other (See Comments)   UNKNOWN REACTION   Propoxyphene N-acetaminophen Nausea Only   Sulfa Antibiotics Other (See Comments)   Unknown reaction   Tramadol Other (See Comments)   UNKNOWN REACTION        Medication List         Accurate as of February 02, 2022  1:38 PM. If you have any questions, ask your nurse or doctor.          ALPRAZolam 1 MG tablet Commonly known as: XANAX TAKE 1 TABLET BY MOUTH EVERY DAY AT BEDTIME AS NEEDED FOR SLEEP   aspirin EC 81 MG tablet Take 1 tablet (81 mg total) by mouth daily. Swallow whole.   azelastine 0.1 % nasal spray Commonly known as: ASTELIN Place 1 spray into both nostrils 2 (two) times daily. Use in each nostril as directed   benzonatate 100 MG capsule Commonly known as: TESSALON Take 1 capsule (100 mg total) by mouth 3 (three) times daily as needed for cough.   FLUoxetine 20 MG capsule Commonly known as: PROZAC TAKE 1 CAPSULE EVERY DAY   fluticasone 50 MCG/ACT nasal spray Commonly known as: FLONASE Place 2 sprays into both nostrils daily.   furosemide 20 MG tablet Commonly known as: LASIX TAKE 1 TABLET EVERY DAY   ipratropium 0.03 % nasal spray Commonly known as: ATROVENT Place 2 sprays into both nostrils every 12 (twelve) hours.   losartan 50 MG tablet Commonly known as: COZAAR TAKE 1 TABLET EVERY DAY   promethazine 25 MG tablet Commonly known as: PHENERGAN TAKE ONE TABLET BY MOUTH EVERY 6 HOURS AS NEEDED FOR NAUSEA AND VOMITING   topiramate 100 MG tablet Commonly known as: TOPAMAX TAKE 1 TABLET TWICE DAILY   triamcinolone ointment 0.1 %  Commonly known as: KENALOG Apply topically as needed        Allergies:  Allergies  Allergen Reactions   Amoxicillin-Pot Clavulanate Diarrhea and Other (See Comments)   Antihistamines, Diphenhydramine-Type Other (See Comments)    Other Reaction: makes pt nervous   Diphenhydramine Hcl Other (See Comments)    Other Reaction: makes pt nervous   Fremanezumab-Vfrm Rash and Other (See Comments)    Seizure, jerking   Other Other (See Comments)   Tramadol Hcl Other (See Comments)   Clindamycin Other (See Comments)    She has not taken but per patient  "her mother had a reaction and she does not want  it ever"   Hydrocodone-Acetaminophen Other (See Comments)    REACTION: sensitive to  but can take for extreme pain per patient   Lisinopril     Restless , irregular sleep   Nortriptyline Other (See Comments)    UNKNOWN REACTION   Propoxyphene N-Acetaminophen Nausea Only   Sulfa Antibiotics Other (See Comments)    Unknown reaction   Tramadol Other (See Comments)    UNKNOWN REACTION    Past Medical History, Surgical history, Social history, and Family History were reviewed and updated.  Review of Systems: All other 10 point review of systems is negative.   Physical Exam:  weight is 178 lb 6.4 oz (80.9 kg). Her oral temperature is 98.2 F (36.8 C). Her blood pressure is 114/86 and her pulse is 70. Her respiration is 18 and oxygen saturation is 99%.   Wt Readings from Last 3 Encounters:  02/02/22 178 lb 6.4 oz (80.9 kg)  01/30/22 176 lb (79.8 kg)  11/18/21 176 lb (79.8 kg)    Ocular: Sclerae unicteric, pupils equal, round and reactive to light Ear-nose-throat: Oropharynx clear, dentition fair Lymphatic: No cervical or supraclavicular adenopathy Lungs no rales or rhonchi, good excursion bilaterally Heart regular rate and rhythm, no murmur appreciated Abd soft, nontender, positive bowel sounds MSK no focal spinal tenderness, no joint edema Neuro: non-focal, well-oriented, appropriate affect Breasts: Deferred   Lab Results  Component Value Date   WBC 6.2 02/02/2022   HGB 13.5 02/02/2022   HCT 42.6 02/02/2022   MCV 95.3 02/02/2022   PLT 70 (L) 02/02/2022   No results found for: FERRITIN, IRON, TIBC, UIBC, IRONPCTSAT Lab Results  Component Value Date   RBC 4.47 02/02/2022   No results found for: KPAFRELGTCHN, LAMBDASER, KAPLAMBRATIO No results found for: IGGSERUM, IGA, IGMSERUM No results found for: Odetta Pink, SPEI   Chemistry      Component Value Date/Time   NA 145 11/21/2021 1411   NA 143 11/11/2017 1339   K  4.3 11/21/2021 1411   K 3.6 11/11/2017 1339   CL 109 11/21/2021 1411   CL 109 (H) 11/11/2017 1339   CO2 28 11/21/2021 1411   CO2 24 11/11/2017 1339   BUN 14 11/21/2021 1411   BUN 9 11/11/2017 1339   CREATININE 1.11 (H) 11/21/2021 1411   CREATININE 1.00 (H) 10/18/2020 1433      Component Value Date/Time   CALCIUM 9.7 11/21/2021 1411   CALCIUM 9.3 11/11/2017 1339   ALKPHOS 80 11/21/2021 1411   ALKPHOS 85 (H) 11/11/2017 1339   AST 14 (L) 11/21/2021 1411   ALT 12 11/21/2021 1411   ALT 24 11/11/2017 1339   BILITOT 0.3 11/21/2021 1411       Impression and Plan: [name] is a pleasant 71 yo caucasian female with thrombocytopenia and B12 deficiency.  B 12 injection given today. We will give her B 12 every other month.  Follow-up in 6 months.   Lottie Dawson, NP 2/27/20231:38 PM

## 2022-02-04 ENCOUNTER — Encounter: Payer: Self-pay | Admitting: Family

## 2022-02-15 ENCOUNTER — Encounter: Payer: Self-pay | Admitting: Pulmonary Disease

## 2022-02-16 ENCOUNTER — Encounter: Payer: Self-pay | Admitting: Pulmonary Disease

## 2022-02-16 ENCOUNTER — Telehealth (INDEPENDENT_AMBULATORY_CARE_PROVIDER_SITE_OTHER): Payer: Medicare HMO | Admitting: Pulmonary Disease

## 2022-02-16 ENCOUNTER — Other Ambulatory Visit: Payer: Self-pay | Admitting: Family Medicine

## 2022-02-16 VITALS — BP 109/78 | HR 93 | Wt 175.0 lb

## 2022-02-16 DIAGNOSIS — R053 Chronic cough: Secondary | ICD-10-CM | POA: Diagnosis not present

## 2022-02-16 DIAGNOSIS — F419 Anxiety disorder, unspecified: Secondary | ICD-10-CM

## 2022-02-16 DIAGNOSIS — F341 Dysthymic disorder: Secondary | ICD-10-CM

## 2022-02-16 NOTE — Patient Instructions (Addendum)
Okay to stop nasal sprays ? ?Return to clinic in 6 months or sooner as needed with Dr. Silas Flood ?

## 2022-02-16 NOTE — Progress Notes (Signed)
Virtual Visit via Video Note ? ?I connected with Ebony Garcia [place] on 02/16/22 at  2:45 PM EDT by a video enabled telemedicine application and verified that I am speaking with the correct person using two identifiers. ? ?Location: ?Patient: Home ?Provider: [facility] - [address], [place], [place] [address] ? ?I discussed the limitations of evaluation and management by telemedicine and the availability of in person appointments. The patient expressed understanding and agreed to proceed. I also discussed with the patient that there may be a patient responsible charge related to this service. The patient expressed understanding and agreed to proceed. ? ?Patient consented to consult via telephone: Yes ?People present and their role in pt care: Pt  ? ?History of Present Illness: ? ?71 year old whom are seen in follow-up of chronic cough.  Trialed on nasal sprays and she reported back via MyChart the cough is greatly improved after him azelastine and Flonase. ? ? ?Chief complaint: Depression ?  ?Overall, doing poorly.  Cough seems less of an issue.  Improved with azelastine and Flonase.  Has recurrent migraines.  These were occurring prior to these new medications.  Worse over the last couple months.  Bad over the last 2 or 3 days.  Change to virtual visit for this.  Wearing sunglasses today during visit.  Stopped azelastine and Flonase that she read side effects could include headache and nausea and vomiting.  These are more likely related to her migraines but just in case she stopped them. ? ?She discussed at length her depression.  She is alone.  Children will talk to her.  Husband left her many years ago.  Mother is dying.  She has no support.  On Wellbutrin but still with significant depression symptoms including lack of appetite, lack of motivation, easy fatigue, falling asleep, feeling of despair.  Discussed at length resources to help treat this for which I am adequately trained  for.  Discussed importance of discussing with her PCP.  With her permission I will message her PCP for further discussion and treatment.  She denied suicidal or homicidal ideation. ? ?Observations/Objective: ? ?Physical exam: ?General: Sitting in couch, sunglasses on face ?Pulmonary: Speaking in full sentences, no conversational dyspnea ? ?Social History  ? ?Tobacco Use  ?Smoking Status Never  ?Smokeless Tobacco Never  ? ?Immunization History  ?Administered Date(s) Administered  ? Fluad Quad(high Dose 65+) 08/03/2019, 11/04/2021  ? Hepatitis B, adult 07/30/2016, 09/29/2016  ? Influenza Split 09/19/2012  ? Influenza Whole 11/08/2009  ? Influenza,inj,Quad PF,6+ Mos 11/07/2013, 07/30/2016, 09/20/2017, 09/14/2018  ? Influenza-Unspecified 09/11/2015  ? Moderna Sars-Covid-2 Vaccination 02/16/2020, 03/18/2020, 10/04/2020  ? PPD Test 07/30/2016, 11/03/2017  ? Pneumococcal Conjugate-13 08/03/2019  ? Td 11/08/2009  ? Zoster Recombinat (Shingrix) 01/17/2022  ? ? ?Assessment and Plan: ? ?Chronic cough: Suspect related to postnasal drip.  Improved with azelastine and Flonase.  Recurrent headaches so stopped this.  Which is fine.  Resume Flonase if cough/nasal congestion were to return in the future. ? ?Depression: Severe symptoms, seems worse.  Affecting her somatic symptoms as well.  Discussed stressors including lack of support in terms of friends or family, financial stressors, etc.  Encouraged her to follow-up with her PCP for further evaluation and resources, therapy, or medications, or otherwise to treat her depression which seems poorly controlled during our interaction.  The vast majority of the visit was spent discussing this topic. ? ?Follow Up Instructions: ? ?Return in about 6 months (around 08/19/2022). ?  ? ?  I discussed the assessment and treatment plan with the patient. The patient was provided an opportunity to ask questions and all were answered. The patient agreed with the plan and demonstrated an understanding  of the instructions. ?  ?The patient was advised to call back or seek an in-person evaluation if the symptoms worsen or if the condition fails to improve as anticipated. ? ?I provided 45 minutes of time including 32 minutes of face-to-face time during this encounter. ? ? ?Lanier Clam, MD  ?

## 2022-02-16 NOTE — Telephone Encounter (Signed)
Requesting: alprazolam '1mg'$   ?Contract: 11/04/2021 ?UDS: 11/04/2021 ?Last Visit: 11/04/2021 ?Next Visit: 05/05/2022 ?Last Refill: 01/05/2022 #30 and 0RF ? ?Please Advise ? ?

## 2022-03-02 ENCOUNTER — Inpatient Hospital Stay: Payer: Medicare HMO | Attending: Hematology & Oncology

## 2022-03-02 ENCOUNTER — Other Ambulatory Visit: Payer: Self-pay

## 2022-03-02 VITALS — BP 116/65 | HR 81 | Temp 98.2°F | Resp 17

## 2022-03-02 DIAGNOSIS — E538 Deficiency of other specified B group vitamins: Secondary | ICD-10-CM | POA: Insufficient documentation

## 2022-03-02 MED ORDER — CYANOCOBALAMIN 1000 MCG/ML IJ SOLN
1000.0000 ug | Freq: Once | INTRAMUSCULAR | Status: AC
  Administered 2022-03-02: 1000 ug via INTRAMUSCULAR
  Filled 2022-03-02: qty 1

## 2022-03-02 NOTE — Patient Instructions (Signed)
Vitamin B12 Deficiency Vitamin B12 deficiency means that your body does not have enough vitamin B12. The body needs this important vitamin: To make red blood cells. To make genes (DNA). To help the nerves work. If you do not have enough vitamin B12 in your body, you can have health problems, such as not having enough red blood cells in the blood (anemia). What are the causes? Not eating enough foods that contain vitamin B12. Not being able to take in (absorb) vitamin B12 from the food that you eat. Certain diseases. A condition in which the body does not make enough of a certain protein. This results in your body not taking in enough vitamin B12. Having a surgery in which part of the stomach or small intestine is taken out. Taking medicines that make it hard for the body to take in vitamin B12. These include: Heartburn medicines. Some medicines that are used to treat diabetes. What increases the risk? Being an older adult. Eating a vegetarian or vegan diet that does not include any foods that come from animals. Not eating enough foods that contain vitamin B12 while you are pregnant. Taking certain medicines. Having alcoholism. What are the signs or symptoms? In some cases, there are no symptoms. If the condition leads to too few blood cells or nerve damage, symptoms can occur, such as: Feeling weak or tired. Not being hungry. Losing feeling (numbness) or tingling in your hands and feet. Redness and burning of the tongue. Feeling sad (depressed). Confusion or memory problems. Trouble walking. If anemia is very bad, symptoms can include: Being short of breath. Being dizzy. Having a very fast heartbeat. How is this treated? Changing the way you eat and drink, such as: Eating more foods that contain vitamin B12. Drinking little or no alcohol. Getting vitamin B12 shots. Taking vitamin B12 supplements by mouth (orally). Your doctor will tell you the dose that is best for you. Follow  these instructions at home: Eating and drinking  Eat foods that come from animals and have a lot of vitamin B12 in them. These include: Meats and poultry. This includes beef, pork, chicken, turkey, and organ meats, such as liver. Seafood, such as clams, rainbow trout, salmon, tuna, and haddock. Eggs. Dairy foods such as milk, yogurt, and cheese. Eat breakfast cereals that have vitamin B12 added to them (are fortified). Check the label. The items listed above may not be a complete list of foods and beverages you can eat and drink. Contact a dietitian for more information. Alcohol use Do not drink alcohol if: Your doctor tells you not to drink. You are pregnant, may be pregnant, or are planning to become pregnant. If you drink alcohol: Limit how much you have to: 0-1 drink a day for women. 0-2 drinks a day for men. Know how much alcohol is in your drink. In the [place] one drink equals one 12 oz bottle of beer (355 mL), one 5 oz glass of wine (148 mL), or one 1 oz glass of hard liquor (44 mL). General instructions Get any vitamin B12 shots if told by your doctor. Take supplements only as told by your doctor. Follow the directions. Keep all follow-up visits. Contact a doctor if: Your symptoms come back. Your symptoms get worse or do not get better with treatment. Get help right away if: You have trouble breathing. You have a very fast heartbeat. You have chest pain. You get dizzy. You faint. These symptoms may be an emergency. Get help right away. Call 911.   Do not wait to see if the symptoms will go away. Do not drive yourself to the hospital. Summary Vitamin B12 deficiency means that your body is not getting enough of the vitamin. In some cases, there are no symptoms of this condition. Treatment may include making a change in the way you eat and drink, getting shots, or taking supplements. Eat foods that have vitamin B12 in them. This information is not intended to replace advice  given to you by your health care provider. Make sure you discuss any questions you have with your health care provider. Document Revised: 07/18/2021 Document Reviewed: 07/18/2021 Elsevier Patient Education  2022 [employer] Inc.  

## 2022-03-04 ENCOUNTER — Other Ambulatory Visit: Payer: Self-pay | Admitting: Family Medicine

## 2022-03-04 DIAGNOSIS — I5189 Other ill-defined heart diseases: Secondary | ICD-10-CM

## 2022-03-04 DIAGNOSIS — I10 Essential (primary) hypertension: Secondary | ICD-10-CM

## 2022-03-07 ENCOUNTER — Encounter: Payer: Self-pay | Admitting: Neurology

## 2022-03-09 ENCOUNTER — Other Ambulatory Visit: Payer: Self-pay

## 2022-03-09 ENCOUNTER — Telehealth: Payer: Self-pay | Admitting: Pharmacy Technician

## 2022-03-09 NOTE — Telephone Encounter (Addendum)
Auth Submission: PENDING ? ?Payer: [employer]ICARE ?Medication & CPT/J Code(s) submitted: Vyepti (Eptinezumab) J1216 ?Route of submission (phone, fax, portal): PHONE: [phone] ?FAX: [phone] ?Auth type: Buy/Bill ?Units/visits requested: '100mg'$  q3 months ?Reference number: [id number] ?Approval from:  to  at [facility]  ? ? ?Will f/u once I have a response ?

## 2022-03-09 NOTE — Progress Notes (Signed)
Per Dr.Jaffe, Pt start Vyepti 100 mg ?

## 2022-03-10 NOTE — Telephone Encounter (Signed)
Dr. Loretta Plume, ?Fyi note: ? ?Auth Submission: APPROVED ?Payer: [employer] MEDICARE ?Medication & CPT/J Code(s) submitted: Vyepti (Eptinezumab) F7902 ?Route of submission (phone, fax, portal): PHONE: [phone] ?Auth type: Buy/Bill ?Units/visits requested: '100MG'$  Q3 MO ?Reference number: [id number] ?Approval from: 02/27/22 to 12/06/22  ? ?Patient will be scheduled as soon as possible

## 2022-03-18 DIAGNOSIS — G5761 Lesion of plantar nerve, right lower limb: Secondary | ICD-10-CM | POA: Diagnosis not present

## 2022-03-18 DIAGNOSIS — M25571 Pain in right ankle and joints of right foot: Secondary | ICD-10-CM | POA: Diagnosis not present

## 2022-03-18 DIAGNOSIS — M79671 Pain in right foot: Secondary | ICD-10-CM | POA: Diagnosis not present

## 2022-03-18 DIAGNOSIS — G5762 Lesion of plantar nerve, left lower limb: Secondary | ICD-10-CM | POA: Diagnosis not present

## 2022-03-18 DIAGNOSIS — M25572 Pain in left ankle and joints of left foot: Secondary | ICD-10-CM | POA: Diagnosis not present

## 2022-03-18 DIAGNOSIS — M7731 Calcaneal spur, right foot: Secondary | ICD-10-CM | POA: Diagnosis not present

## 2022-03-18 DIAGNOSIS — M79672 Pain in left foot: Secondary | ICD-10-CM | POA: Diagnosis not present

## 2022-03-18 DIAGNOSIS — M7732 Calcaneal spur, left foot: Secondary | ICD-10-CM | POA: Diagnosis not present

## 2022-03-19 ENCOUNTER — Encounter: Payer: Self-pay | Admitting: Neurology

## 2022-03-23 ENCOUNTER — Other Ambulatory Visit: Payer: Self-pay | Admitting: Family Medicine

## 2022-03-23 DIAGNOSIS — F419 Anxiety disorder, unspecified: Secondary | ICD-10-CM

## 2022-03-23 NOTE — Telephone Encounter (Signed)
Requesting:xanax 1 mg  ?Contract:11/04/21 ?UDS:11/04/21 ?Last Visit:11/04/21 ?Next Visit:05/05/22 ?Last Refill:02/17/22 ? ?Please Advise  ?

## 2022-03-24 ENCOUNTER — Ambulatory Visit (INDEPENDENT_AMBULATORY_CARE_PROVIDER_SITE_OTHER): Payer: Medicare HMO

## 2022-03-24 VITALS — BP 128/79 | HR 76 | Temp 98.2°F | Resp 18 | Ht 62.0 in | Wt 176.4 lb

## 2022-03-24 DIAGNOSIS — G43119 Migraine with aura, intractable, without status migrainosus: Secondary | ICD-10-CM | POA: Diagnosis not present

## 2022-03-24 MED ORDER — SODIUM CHLORIDE 0.9 % IV SOLN
100.0000 mg | Freq: Once | INTRAVENOUS | Status: AC
  Administered 2022-03-24: 100 mg via INTRAVENOUS
  Filled 2022-03-24: qty 1

## 2022-03-24 NOTE — Progress Notes (Signed)
Diagnosis: Intractable Migraine ? ?Provider:  Marshell Garfinkel, MD ? ?Procedure: Infusion ? ?IV Type: Peripheral, IV Location: L Antecubital ? ?Vyepti (Eptinezumab-jjmr), Dose: 100 mg ? ?Infusion Start Time: 1683 ? ?Infusion Stop Time: 7290 ? ?Post Infusion IV Care: Observation period completed and Peripheral IV Discontinued ? ?Discharge: Condition: Good, Destination: Home . AVS provided to patient.  ? ?Performed by:  Koren Shiver, RN  ?  ?

## 2022-03-24 NOTE — Progress Notes (Signed)
? ?NEUROLOGY FOLLOW UP OFFICE NOTE ? ?Ebony Garcia ?[id number] ? ?Assessment/Plan:  ? ?Migraine without aura, without status migrainosus, not intractable ? ?  ?Migraine prevention:  Vyepti '100mg'$ , topiramate '100mg'$  twice daily ?Switch from promethazine to metoclopramide (more effective for her nausea) ?Limit use of pain relievers to no more than 2 days out of week to prevent risk of rebound or medication-overuse headache. ?Keep headache diary ?Follow up 6 months ?  ?  ?Subjective:  ?Ebony Garcia is a 71 year old right-handed woman with hypertension, CHF, B12 deficiency, depression and hyperlipidemia who follows up for migraines. ?  ?UPDATE ?She was having headaches daily.  Received first dose of Vyepti yesterday. Promethazine isn't as effective as she hoped.  Reglan previously was helpful. ? ?  ?Current NSAIDS:  no ?Current analgesics:  no. ?Current triptans:   none ?Current anti-emetic:  Phenergan '25mg'$  ?Current muscle relaxants:  no ?Current anti-anxiolytic:  Xanax '1mg'$  ?Current sleep aide:  Xanax '1mg'$  ?Current Antihypertensive medications:  losartan, Lasix ?Current Antidepressant medications:  Prozac '20mg'$  ?Current Anticonvulsant medications:  topiramate '100mg'$  BID ?Current anti-CGRP:  Vyepti '100mg'$  (started yesterday) ?  ?Depression and anxiety:  Yes ?She reports off and on body aches, paresthesias, head pains. ?Reports drinking 64 oz water daily ?Sleep:  She says she sleeps well.  However, daytime fatigue.  Ordered sleep study.  She never received a call to schedule. ?  ?HISTORY: ?Onset:  Since her late 44s. ?Location: usually left sided, periorbital, and back of head.  More recently, holocephalic ?Quality:  Sharp, aching pressure.  Non-throbbing ?Initial intensity:  7/10; Sept: 7/10 ?Associated symptoms:  Nausea, photophobia and phonophobia.  No visual disturbance, osmophobia, vomiting or unilateral numbness or weakness ?Aura:  no ?Initial duration:  3 days; Sept: 30 minutes with Maxalt ?Initial frequency:   25 headache days per month ?Triggers:  Emotional stress ?Relieving factors:  Cold mask, heating pack on neck and shoulders ?  ?Past NSAIDs:  naproxen (ineffective), indomethacin, toradol tablet (ineffective), Toradol '60mg'$  IM (effective), Cambia (side effects) ?Past analgesics:  Tylenol (ineffective), Excedrin (ineffective), Lidocaine nasal drops (effective but caused hives), tramadol (side effects), Midrin (effective but made her sleepy) ?Past triptans/ergots:  sumatriptan '100mg'$ , Zomig '5mg'$  (effective), Frova, Relpax, DHE (effective), Maxalt (effective but unable to afford it), Tosymra NS, Zomig '5mg'$  NS ?Past antiemetic:  Reglan ?Past anxiolytics:  Vicodin ?Past muscle relaxants:  cyclobenzaprine ?Past antihypertensives:  propranolol (ineffective), atenolol (ineffective), lisinopril, metoprolol, HCTZ ?Past antidepressants:  amitriptyline (side effects), venlafaxine (ineffective), nortriptyline (effective but side effects such as rash), imipramine ?Past antiepileptics:  Depakote (hair loss), lamotrigine (side effects), zonisamide (side effects), gabapentin, Keppra ?Past CGRP inhibitor:  Aimovig (effective but concerned about side effects- ankle jerking, abnormal skin pattern), Ajovy (same side effects); Emgality (cost) ?Past vitamins and supplements:  magnesium (ineffective), feverfew (ineffective) ?Other past therapy:  Botox (one round, flu like symptoms, unable to afford 20% copay), occipital nerve blocks, trigger point injections, alternating heat/ice ?  ?Family history of headache:  Mother, grandmother, great-aunt, cousins. ?  ?08/16/08 MRI Brain w/wo performed for ?explosive headaches?: nonspecific punctate hyperintensities in the subcortical white matter. ?08/16/08 MRA Head: Unremarkable.  There is mild attenuation in the vertebrobasilar junction and proximal basilar artery which is likely artifact. ?07/05/19 CTA of Head:  Unremarkable for mass lesion, high-grade arterial stenosis, aneurysm or other acute  intracranial abnormality. ? ?PAST MEDICAL HISTORY: ?Past Medical History:  ?Diagnosis Date  ? Acute diastolic CHF (congestive heart failure) ([place]) 04/30/2013  ? 4/27-06/23 acute diastolic heart failure  BNP 1528 2 D  echocardiogram essentially negative  Chest x-ray revealed cardiomegaly with costophrenic angle blunting and suggestion of possible interstitial vascular accentuation; no frank heart failure present. 10 pound weight loss with 20 mg of furosemide daily  No definite etiology of the cardiomegaly or acute diastolic failure   ? Anxiety 02/07/2015  ? Auditory hallucination 10/06/2017  ? B12 deficiency 10/07/2018  ? Bradycardia 04/30/2013  ? Cancer [facility])   ? Chest pain 05/14/2020  ? Chest pain with moderate risk of acute coronary syndrome 02/07/2015  ? CHF (congestive heart failure) ([place])   ? dystolic heart dysfunction  ? Chronic kidney disease   ? De Quervain's tenosynovitis, left 05/23/2013  ? Depression   ? Depression with anxiety   ? Depressive disorder, not elsewhere classified 05/31/2007  ? Centricity Description: DISORDER, DEPRESSIVE NEC Qualifier: Diagnosis of  By: Linna Darner MD, [place] Octave, [place]   ? Diabetes mellitus without complication ([place])   ? Diet controlled  ? Dysmetabolic syndrome X 4/74/2595  ? Qualifier: Diagnosis of  By: Linna Darner MD, [place]  A1c 6.3% in 2/12; TG 278   ? Essential hypertension 08/09/2008  ? Qualifier: Diagnosis of  By: Linna Darner MD, [place]    ? Estrogen deficiency 02/02/2019  ? Estrogen deficiency 02/02/2019  ? Fatigue 02/02/2019  ? Hair loss 02/02/2019  ? Hyperlipidemia 02/27/2008  ? Qualifier: Diagnosis of  By: Linna Darner MD, Gwyndolyn Saxon    ? Hypertension   ? Intertrigo   ? INTERTRIGO 11/08/2009  ? Qualifier: Diagnosis of  By: Inda Castle FNP, [place]   ? Intractable migraine with aura without status migrainosus 08/03/2018  ? Migraine variant 08/15/2008  ? Qualifier: Diagnosis of  By: Linna Darner MD, [place]   Onset:initially @ age 65;recurrence age 4 during 1st trimester.  Recurrence @ 39. No prodrome or aura Previously on Tryptans & Topamax with temporary relief but tolerance appeared Botox by Dr Leward Quan 09/2010  most effective    ? Migraines   ? Myalgia 02/02/2019  ? NONSPECIFIC ABNORMAL ELECTROCARDIOGRAM 08/15/2008  ? Qualifier: Diagnosis of  By: Linna Darner MD, Gwyndolyn Saxon    ? Palpitation 04/17/2014  ? Panic attacks   ? Panic attacks   ? Polydipsia 11/02/2007  ? Qualifier: Diagnosis of  By: Linna Darner MD, [place]    ? RENAL CALCULUS 02/21/2007  ? Qualifier: Diagnosis of  By: Allen Norris    ? Squamous cell carcinoma in situ of skin 12/26/2015  ? Thrombocytopenia ([place]) 10/06/2017  ? Viral syndrome 04/10/2019  ? WEIGHT GAIN 11/02/2007  ? Qualifier: Diagnosis of  By: Linna Darner MD, Gwyndolyn Saxon    ? ? ?MEDICATIONS: ?Current Outpatient Medications on File Prior to Visit  ?Medication Sig Dispense Refill  ? ALPRAZolam (XANAX) 1 MG tablet TAKE 1 TABLET BY MOUTH EVERY DAY AT BEDTIME AS NEEDED FOR SLEEP 30 tablet 0  ? aspirin EC 81 MG tablet Take 1 tablet (81 mg total) by mouth daily. Swallow whole. 30 tablet 11  ? azelastine (ASTELIN) 0.1 % nasal spray Place 1 spray into both nostrils 2 (two) times daily. Use in each nostril as directed 30 mL 12  ? FLUoxetine (PROZAC) 20 MG capsule TAKE 1 CAPSULE EVERY DAY 90 capsule 1  ? fluticasone (FLONASE) 50 MCG/ACT nasal spray Place 2 sprays into both nostrils daily. 16 g 6  ? furosemide (LASIX) 20 MG tablet TAKE 1 TABLET EVERY DAY 90 tablet 1  ? losartan (COZAAR) 50 MG tablet TAKE 1 TABLET EVERY DAY 90 tablet 1  ? promethazine (PHENERGAN) 25 MG tablet TAKE ONE  TABLET BY MOUTH EVERY 6 HOURS AS NEEDED FOR NAUSEA AND VOMITING 30 tablet 2  ? topiramate (TOPAMAX) 100 MG tablet TAKE 1 TABLET TWICE DAILY 180 tablet 0  ? triamcinolone ointment (KENALOG) 0.1 % Apply topically as needed    ? ?No current facility-administered medications on file prior to visit.  ? ? ?ALLERGIES: ?Allergies  ?Allergen Reactions  ? Amoxicillin-Pot Clavulanate Diarrhea and Other (See Comments)  ?  Antihistamines, Diphenhydramine-Type Other (See Comments)  ?  Other Reaction: makes pt nervous  ? Diphenhydramine Hcl Other (See Comments)  ?  Other Reaction: makes pt nervous  ? Fremanezumab-Vfrm Rash and Other (S

## 2022-03-25 ENCOUNTER — Encounter: Payer: Self-pay | Admitting: Neurology

## 2022-03-25 ENCOUNTER — Ambulatory Visit: Payer: Medicare HMO | Admitting: Neurology

## 2022-03-25 VITALS — BP 113/71 | HR 86 | Ht 62.0 in | Wt 176.6 lb

## 2022-03-25 DIAGNOSIS — M79672 Pain in left foot: Secondary | ICD-10-CM | POA: Diagnosis not present

## 2022-03-25 DIAGNOSIS — M79671 Pain in right foot: Secondary | ICD-10-CM | POA: Diagnosis not present

## 2022-03-25 DIAGNOSIS — G43009 Migraine without aura, not intractable, without status migrainosus: Secondary | ICD-10-CM

## 2022-03-25 MED ORDER — METOCLOPRAMIDE HCL 10 MG PO TABS: 10.0000 mg | ORAL_TABLET | Freq: Four times a day (QID) | ORAL | 5 refills | Status: AC | PRN

## 2022-03-25 NOTE — Patient Instructions (Addendum)
Continue Vyepti and topiramate ?Instead of promethazine, sent prescription for metoclopramide   ?Limit use of pain relievers to no more than 2 days out of week to prevent risk of rebound or medication-overuse headache. ?Keep headache diary ?Follow up 6 months. ?

## 2022-04-01 DIAGNOSIS — M25571 Pain in right ankle and joints of right foot: Secondary | ICD-10-CM | POA: Diagnosis not present

## 2022-04-01 DIAGNOSIS — M25572 Pain in left ankle and joints of left foot: Secondary | ICD-10-CM | POA: Diagnosis not present

## 2022-04-02 ENCOUNTER — Inpatient Hospital Stay: Payer: Medicare HMO | Attending: Hematology & Oncology

## 2022-04-02 VITALS — BP 116/65 | HR 75 | Temp 98.0°F | Resp 17

## 2022-04-02 DIAGNOSIS — E538 Deficiency of other specified B group vitamins: Secondary | ICD-10-CM | POA: Insufficient documentation

## 2022-04-02 MED ORDER — CYANOCOBALAMIN 1000 MCG/ML IJ SOLN
1000.0000 ug | Freq: Once | INTRAMUSCULAR | Status: AC
  Administered 2022-04-02: 1000 ug via INTRAMUSCULAR
  Filled 2022-04-02: qty 1

## 2022-04-02 NOTE — Patient Instructions (Signed)
Vitamin B12 Deficiency Vitamin B12 deficiency occurs when the body does not have enough of this important vitamin. The body needs this vitamin: To make red blood cells. To make DNA. This is the genetic material inside cells. To help the nerves work properly so they can carry messages from the brain to the body. Vitamin B12 deficiency can cause health problems, such as not having enough red blood cells in the blood (anemia). This can lead to nerve damage if untreated. What are the causes? This condition may be caused by: Not eating enough foods that contain vitamin B12. Not having enough stomach acid and digestive fluids to properly absorb vitamin B12 from the food that you eat. Having certain diseases that make it hard to absorb vitamin B12. These diseases include Crohn's disease, chronic pancreatitis, and cystic fibrosis. An autoimmune disorder in which the body does not make enough of a protein (intrinsic factor) within the stomach, resulting in not enough absorption of vitamin B12. Having a surgery in which part of the stomach or small intestine is removed. Taking certain medicines that make it hard for the body to absorb vitamin B12. These include: Heartburn medicines, such as antacids and proton pump inhibitors. Some medicines that are used to treat diabetes. What increases the risk? The following factors may make you more likely to develop a vitamin B12 deficiency: Being an older adult. Eating a vegetarian or vegan diet that does not include any foods that come from animals. Eating a poor diet while you are pregnant. Taking certain medicines. Having alcoholism. What are the signs or symptoms? In some cases, there are no symptoms of this condition. If the condition leads to anemia or nerve damage, various symptoms may occur, such as: Weakness. Tiredness (fatigue). Loss of appetite. Numbness or tingling in your hands and feet. Redness and burning of the tongue. Depression,  confusion, or memory problems. Trouble walking. If anemia is severe, symptoms can include: Shortness of breath. Dizziness. Rapid heart rate. How is this diagnosed? This condition may be diagnosed with a blood test to measure the level of vitamin B12 in your blood. You may also have other tests, including: A group of tests that measure certain characteristics of blood cells (complete blood count, CBC). A blood test to measure intrinsic factor. A procedure where a thin tube with a camera on the end is used to look into your stomach or intestines (endoscopy). Other tests may be needed to discover the cause of the deficiency. How is this treated? Treatment for this condition depends on the cause. This condition may be treated by: Changing your eating and drinking habits, such as: Eating more foods that contain vitamin B12. Drinking less alcohol or no alcohol. Getting vitamin B12 injections. Taking vitamin B12 supplements by mouth (orally). Your health care provider will tell you which dose is best for you. Follow these instructions at home: Eating and drinking  Include foods in your diet that come from animals and contain a lot of vitamin B12. These include: Meats and poultry. This includes beef, pork, chicken, turkey, and organ meats, such as liver. Seafood. This includes clams, rainbow trout, salmon, tuna, and haddock. Eggs. Dairy foods such as milk, yogurt, and cheese. Eat foods that have vitamin B12 added to them (are fortified), such as ready-to-eat breakfast cereals. Check the label on the package to see if a food is fortified. The items listed above may not be a complete list of foods and beverages you can eat and drink. Contact a dietitian for   more information. Alcohol use Do not drink alcohol if: Your health care provider tells you not to drink. You are pregnant, may be pregnant, or are planning to become pregnant. If you drink alcohol: Limit how much you have to: 0-1 drink a  day for women. 0-2 drinks a day for men. Know how much alcohol is in your drink. In the [place] one drink equals one 12 oz bottle of beer (355 mL), one 5 oz glass of wine (148 mL), or one 1 oz glass of hard liquor (44 mL). General instructions Get vitamin B12 injections if told to by your health care provider. Take supplements only as told by your health care provider. Follow the directions carefully. Keep all follow-up visits. This is important. Contact a health care provider if: Your symptoms come back. Your symptoms get worse or do not improve with treatment. Get help right away: You develop shortness of breath. You have a rapid heart rate. You have chest pain. You become dizzy or you faint. These symptoms may be an emergency. Get help right away. Call 911. Do not wait to see if the symptoms will go away. Do not drive yourself to the hospital. Summary Vitamin B12 deficiency occurs when the body does not have enough of this important vitamin. Common causes include not eating enough foods that contain vitamin B12, not being able to absorb vitamin B12 from the food that you eat, having a surgery in which part of the stomach or small intestine is removed, or taking certain medicines. Eat foods that have vitamin B12 in them. Treatment may include making a change in the way you eat and drink, getting vitamin B12 injections, or taking vitamin B12 supplements. This information is not intended to replace advice given to you by your health care provider. Make sure you discuss any questions you have with your health care provider. Document Revised: 07/18/2021 Document Reviewed: 07/18/2021 [employer] Patient Education  2023 [employer]  

## 2022-05-01 ENCOUNTER — Inpatient Hospital Stay: Payer: Medicare HMO | Attending: Hematology & Oncology

## 2022-05-01 VITALS — BP 133/68 | HR 66 | Temp 98.1°F | Resp 18

## 2022-05-01 DIAGNOSIS — E538 Deficiency of other specified B group vitamins: Secondary | ICD-10-CM | POA: Diagnosis not present

## 2022-05-01 MED ORDER — CYANOCOBALAMIN 1000 MCG/ML IJ SOLN
1000.0000 ug | Freq: Once | INTRAMUSCULAR | Status: AC
  Administered 2022-05-01: 1000 ug via INTRAMUSCULAR
  Filled 2022-05-01: qty 1

## 2022-05-01 NOTE — Patient Instructions (Signed)
Vitamin B12 Deficiency Vitamin B12 deficiency occurs when the body does not have enough of this important vitamin. The body needs this vitamin: To make red blood cells. To make DNA. This is the genetic material inside cells. To help the nerves work properly so they can carry messages from the brain to the body. Vitamin B12 deficiency can cause health problems, such as not having enough red blood cells in the blood (anemia). This can lead to nerve damage if untreated. What are the causes? This condition may be caused by: Not eating enough foods that contain vitamin B12. Not having enough stomach acid and digestive fluids to properly absorb vitamin B12 from the food that you eat. Having certain diseases that make it hard to absorb vitamin B12. These diseases include Crohn's disease, chronic pancreatitis, and cystic fibrosis. An autoimmune disorder in which the body does not make enough of a protein (intrinsic factor) within the stomach, resulting in not enough absorption of vitamin B12. Having a surgery in which part of the stomach or small intestine is removed. Taking certain medicines that make it hard for the body to absorb vitamin B12. These include: Heartburn medicines, such as antacids and proton pump inhibitors. Some medicines that are used to treat diabetes. What increases the risk? The following factors may make you more likely to develop a vitamin B12 deficiency: Being an older adult. Eating a vegetarian or vegan diet that does not include any foods that come from animals. Eating a poor diet while you are pregnant. Taking certain medicines. Having alcoholism. What are the signs or symptoms? In some cases, there are no symptoms of this condition. If the condition leads to anemia or nerve damage, various symptoms may occur, such as: Weakness. Tiredness (fatigue). Loss of appetite. Numbness or tingling in your hands and feet. Redness and burning of the tongue. Depression,  confusion, or memory problems. Trouble walking. If anemia is severe, symptoms can include: Shortness of breath. Dizziness. Rapid heart rate. How is this diagnosed? This condition may be diagnosed with a blood test to measure the level of vitamin B12 in your blood. You may also have other tests, including: A group of tests that measure certain characteristics of blood cells (complete blood count, CBC). A blood test to measure intrinsic factor. A procedure where a thin tube with a camera on the end is used to look into your stomach or intestines (endoscopy). Other tests may be needed to discover the cause of the deficiency. How is this treated? Treatment for this condition depends on the cause. This condition may be treated by: Changing your eating and drinking habits, such as: Eating more foods that contain vitamin B12. Drinking less alcohol or no alcohol. Getting vitamin B12 injections. Taking vitamin B12 supplements by mouth (orally). Your health care provider will tell you which dose is best for you. Follow these instructions at home: Eating and drinking  Include foods in your diet that come from animals and contain a lot of vitamin B12. These include: Meats and poultry. This includes beef, pork, chicken, turkey, and organ meats, such as liver. Seafood. This includes clams, rainbow trout, salmon, tuna, and haddock. Eggs. Dairy foods such as milk, yogurt, and cheese. Eat foods that have vitamin B12 added to them (are fortified), such as ready-to-eat breakfast cereals. Check the label on the package to see if a food is fortified. The items listed above may not be a complete list of foods and beverages you can eat and drink. Contact a dietitian for   more information. Alcohol use Do not drink alcohol if: Your health care provider tells you not to drink. You are pregnant, may be pregnant, or are planning to become pregnant. If you drink alcohol: Limit how much you have to: 0-1 drink a  day for women. 0-2 drinks a day for men. Know how much alcohol is in your drink. In the [place] one drink equals one 12 oz bottle of beer (355 mL), one 5 oz glass of wine (148 mL), or one 1 oz glass of hard liquor (44 mL). General instructions Get vitamin B12 injections if told to by your health care provider. Take supplements only as told by your health care provider. Follow the directions carefully. Keep all follow-up visits. This is important. Contact a health care provider if: Your symptoms come back. Your symptoms get worse or do not improve with treatment. Get help right away: You develop shortness of breath. You have a rapid heart rate. You have chest pain. You become dizzy or you faint. These symptoms may be an emergency. Get help right away. Call 911. Do not wait to see if the symptoms will go away. Do not drive yourself to the hospital. Summary Vitamin B12 deficiency occurs when the body does not have enough of this important vitamin. Common causes include not eating enough foods that contain vitamin B12, not being able to absorb vitamin B12 from the food that you eat, having a surgery in which part of the stomach or small intestine is removed, or taking certain medicines. Eat foods that have vitamin B12 in them. Treatment may include making a change in the way you eat and drink, getting vitamin B12 injections, or taking vitamin B12 supplements. This information is not intended to replace advice given to you by your health care provider. Make sure you discuss any questions you have with your health care provider. Document Revised: 07/18/2021 Document Reviewed: 07/18/2021 [employer] Patient Education  2023 [employer]  

## 2022-05-05 ENCOUNTER — Encounter: Payer: Self-pay | Admitting: Family Medicine

## 2022-05-05 ENCOUNTER — Ambulatory Visit (INDEPENDENT_AMBULATORY_CARE_PROVIDER_SITE_OTHER): Payer: Medicare HMO | Admitting: Family Medicine

## 2022-05-05 VITALS — BP 122/88 | HR 81 | Temp 98.2°F | Resp 18 | Ht 62.0 in | Wt 174.6 lb

## 2022-05-05 DIAGNOSIS — E785 Hyperlipidemia, unspecified: Secondary | ICD-10-CM | POA: Diagnosis not present

## 2022-05-05 DIAGNOSIS — E119 Type 2 diabetes mellitus without complications: Secondary | ICD-10-CM | POA: Diagnosis not present

## 2022-05-05 DIAGNOSIS — E559 Vitamin D deficiency, unspecified: Secondary | ICD-10-CM | POA: Diagnosis not present

## 2022-05-05 DIAGNOSIS — F419 Anxiety disorder, unspecified: Secondary | ICD-10-CM

## 2022-05-05 DIAGNOSIS — I10 Essential (primary) hypertension: Secondary | ICD-10-CM | POA: Diagnosis not present

## 2022-05-05 DIAGNOSIS — E538 Deficiency of other specified B group vitamins: Secondary | ICD-10-CM | POA: Diagnosis not present

## 2022-05-05 DIAGNOSIS — Z79899 Other long term (current) drug therapy: Secondary | ICD-10-CM | POA: Diagnosis not present

## 2022-05-05 MED ORDER — ALPRAZOLAM 1 MG PO TABS: ORAL_TABLET | ORAL | 0 refills | Status: DC

## 2022-05-05 NOTE — Patient Instructions (Signed)

## 2022-05-05 NOTE — Assessment & Plan Note (Signed)
Well controlled, no changes to meds. Encouraged heart healthy diet such as the DASH diet and exercise as tolerated.  °

## 2022-05-05 NOTE — Assessment & Plan Note (Signed)
Encourage heart healthy diet such as MIND or DASH diet, increase exercise, avoid trans fats, simple carbohydrates and processed foods, consider a krill or fish or flaxseed oil cap daily.  °

## 2022-05-05 NOTE — Progress Notes (Signed)
Established Patient Office Visit  Subjective   Patient ID: Ebony Garcia, female    DOB: Jul 21, 1951  Age: 71 y.o. MRN: [id number]  Chief Complaint  Patient presents with   Hypertension   Hyperlipidemia   Diabetes   Follow-up    HPI  Patient Active Problem List   Diagnosis Date Noted   Acute non-recurrent pansinusitis 10/20/2021   Chronic cough 10/20/2021   Acute low back pain 02/24/2021   Lower abdominal pain 02/24/2021   Panic attacks    Migraines    Intertrigo    Hypertension    Diabetes mellitus without complication ([place])    Depression    Chronic kidney disease    CHF (congestive heart failure) ([place])    Cancer ([place])    Chest pain 05/14/2020   Viral syndrome 04/10/2019   Myalgia 02/02/2019   Fatigue 02/02/2019   Estrogen deficiency 02/02/2019   Hair loss 02/02/2019   B12 deficiency 10/07/2018   Intractable migraine with aura without status migrainosus 08/03/2018   Thrombocytopenia ([place]) 10/06/2017   Auditory hallucination 10/06/2017   Depression with anxiety 05/14/2017   Squamous cell carcinoma in situ of skin 12/26/2015   Chest pain with moderate risk of acute coronary syndrome 02/07/2015   Anxiety 02/07/2015   Palpitation 04/17/2014   De Quervain's tenosynovitis, left 97/35/3299   Acute diastolic CHF (congestive heart failure) ([place]) 04/30/2013   Bradycardia 04/30/2013   INTERTRIGO 11/08/2009   Migraine variant 08/15/2008   NONSPECIFIC ABNORMAL ELECTROCARDIOGRAM 08/15/2008   Essential hypertension 08/09/2008   Hyperlipidemia 24/26/8341   Dysmetabolic syndrome X 96/22/2979   WEIGHT GAIN 11/02/2007   POLYDIPSIA 11/02/2007   Depressive disorder, not elsewhere classified 05/31/2007   RENAL CALCULUS 02/21/2007   Past Medical History:  Diagnosis Date   Acute diastolic CHF (congestive heart failure) ([place]) 04/30/2013   8/92-11/94 acute diastolic heart failure  BNP 1528 2 D echocardiogram essentially negative  Chest x-ray revealed cardiomegaly with  costophrenic angle blunting and suggestion of possible interstitial vascular accentuation; no frank heart failure present. 10 pound weight loss with 20 mg of furosemide daily  No definite etiology of the cardiomegaly or acute diastolic failure    Anxiety 02/07/2015   Auditory hallucination 10/06/2017   B12 deficiency 10/07/2018   Bradycardia 04/30/2013   Cancer ([place])    Chest pain 05/14/2020   Chest pain with moderate risk of acute coronary syndrome 02/07/2015   CHF (congestive heart failure) ([place])    dystolic heart dysfunction   Chronic kidney disease    De Quervain's tenosynovitis, left 05/23/2013   Depression    Depression with anxiety    Depressive disorder, not elsewhere classified 05/31/2007   Centricity Description: DISORDER, DEPRESSIVE NEC Qualifier: Diagnosis of  By: Linna Darner MD, [place] Octave, [place]    Diabetes mellitus without complication [facility])    Diet controlled   Dysmetabolic syndrome X 1/74/0814   Qualifier: Diagnosis of  By: Linna Darner MD, William  A1c 6.3% in 2/12; TG 278    Essential hypertension 08/09/2008   Qualifier: Diagnosis of  By: Linna Darner MD, William     Estrogen deficiency 02/02/2019   Estrogen deficiency 02/02/2019   Fatigue 02/02/2019   Hair loss 02/02/2019   Hyperlipidemia 02/27/2008   Qualifier: Diagnosis of  By: Linna Darner MD, William     Hypertension    Intertrigo    INTERTRIGO 11/08/2009   Qualifier: Diagnosis of  By: Inda Castle FNP, Melissa S    Intractable migraine with aura without status migrainosus 08/03/2018  Migraine variant 08/15/2008   Qualifier: Diagnosis of  By: Linna Darner MD, Gwyndolyn Saxon   Onset:initially @ age 39;recurrence age 49 during 1st trimester. Recurrence @ 39. No prodrome or aura Previously on Tryptans & Topamax with temporary relief but tolerance appeared Botox by Dr Leward Quan 09/2010  most effective     Migraines    Myalgia 02/02/2019   NONSPECIFIC ABNORMAL ELECTROCARDIOGRAM 08/15/2008   Qualifier: Diagnosis of  By: Linna Darner  MD, William     Palpitation 04/17/2014   Panic attacks    Panic attacks    Polydipsia 11/02/2007   Qualifier: Diagnosis of  By: Linna Darner MD, William     RENAL CALCULUS 02/21/2007   Qualifier: Diagnosis of  By: Allen Norris     Squamous cell carcinoma in situ of skin 12/26/2015   Thrombocytopenia ([place]) 10/06/2017   Viral syndrome 04/10/2019   WEIGHT GAIN 11/02/2007   Qualifier: Diagnosis of  By: Linna Darner MD, Gwyndolyn Saxon     Past Surgical History:  Procedure Laterality Date   ABDOMINAL HYSTERECTOMY     BREAST SURGERY     DILATION AND CURETTAGE OF UTERUS     NASAL SEPTUM SURGERY     papaloma Right    TONSILLECTOMY AND ADENOIDECTOMY     TOTAL ABDOMINAL HYSTERECTOMY W/ BILATERAL SALPINGOOPHORECTOMY     for fibroids and migraines   TUBAL LIGATION     Social History   Tobacco Use   Smoking status: Never   Smokeless tobacco: Never  Vaping Use   Vaping Use: Never used  Substance Use Topics   Alcohol use: No    Alcohol/week: 0.0 standard drinks   Drug use: No   Social History   Socioeconomic History   Marital status: Divorced    Spouse name: Not on file   Number of children: 3   Years of education: Not on file   Highest education level: Not on file  Occupational History    Employer: UNEMPLOYED  Tobacco Use   Smoking status: Never   Smokeless tobacco: Never  Vaping Use   Vaping Use: Never used  Substance and Sexual Activity   Alcohol use: No    Alcohol/week: 0.0 standard drinks   Drug use: No   Sexual activity: Not Currently    Partners: Male  Other Topics Concern   Not on file  Social History Narrative   Lives alone.        Patient is right-handed. She lives on the first floor.      Drinks caffeine   Social Determinants of Radio broadcast assistant Strain: Low Risk    Difficulty of Paying Living Expenses: Not hard at all  Food Insecurity: No Food Insecurity   Worried About Charity fundraiser in the Last Year: Never true   Arboriculturist in the Last Year: Never  true  Transportation Needs: No Transportation Needs   Lack of Transportation (Medical): No   Lack of Transportation (Non-Medical): No  Physical Activity: Inactive   Days of Exercise per Week: 0 days   Minutes of Exercise per Session: 0 min  Stress: No Stress Concern Present   Feeling of Stress : Not at all  Social Connections: Socially Isolated   Frequency of Communication with Friends and Family: More than three times a week   Frequency of Social Gatherings with Friends and Family: Twice a week   Attends Religious Services: Never   Marine scientist or Organizations: No   Attends Archivist Meetings: Never  Marital Status: Divorced  Human resources officer Violence: Not At Risk   Fear of Current or Ex-Partner: No   Emotionally Abused: No   Physically Abused: No   Sexually Abused: No   Family Status  Relation Name Status   Mother  Alive   Father  Alive   Brother  Deceased at age 30       renal cell carcinoma    Sister  Physicist, medical  (Not Specified)   Son  Alive   Son  Alive   Son  Alive   Mat Aunt  (Not Specified)   Administrator  (Not Specified)   PGF  (Not Specified)   Mat Aunt  (Not Specified)   Cousin  (Not Specified)   Cousin  (Not Specified)   Neg Hx  (Not Specified)   Family History  Problem Relation Age of Onset   Crohn's disease Mother    Atrial fibrillation Mother    Heart failure Mother    Bladder Cancer Father    Cancer Father        bladder   Bipolar disorder Brother    Cancer Brother    Bipolar disorder Sister    Heart attack Maternal Grandmother        >65   Cancer Maternal Aunt    Colon cancer Maternal Uncle    Cancer Paternal Grandfather    Cancer Maternal Aunt    Cancer Cousin    Anemia Cousin    Diabetes Neg Hx    Stroke Neg Hx    Allergies  Allergen Reactions   Amoxicillin-Pot Clavulanate Diarrhea and Other (See Comments)   Antihistamines, Diphenhydramine-Type Other (See Comments)    Other Reaction: makes pt nervous    Diphenhydramine Hcl Other (See Comments)    Other Reaction: makes pt nervous   Fremanezumab-Vfrm Rash and Other (See Comments)    Seizure, jerking   Other Other (See Comments)   Tramadol Hcl Other (See Comments)   Clindamycin Other (See Comments)    She has not taken but per patient  "her mother had a reaction and she does not want it ever"   Hydrocodone-Acetaminophen Other (See Comments)    REACTION: sensitive to  but can take for extreme pain per patient   Lisinopril     Restless , irregular sleep   Nortriptyline Other (See Comments)    UNKNOWN REACTION   Propoxyphene N-Acetaminophen Nausea Only   Sulfa Antibiotics Other (See Comments)    Unknown reaction   Tramadol Other (See Comments)    UNKNOWN REACTION      Review of Systems  Constitutional:  Negative for fever and malaise/fatigue.  HENT:  Negative for congestion.   Eyes:  Negative for blurred vision.  Respiratory:  Negative for cough and shortness of breath.   Cardiovascular:  Negative for chest pain, palpitations and leg swelling.  Gastrointestinal:  Negative for vomiting.  Musculoskeletal:  Negative for back pain.  Skin:  Negative for rash.  Neurological:  Negative for loss of consciousness and headaches.     Objective:     BP 122/88 (BP Location: Left Arm, Patient Position: Sitting, Cuff Size: Normal)   Pulse 81   Temp 98.2 F (36.8 C) (Oral)   Resp 18   Ht '5\' 2"'$  (1.575 m)   Wt 174 lb 9.6 oz (79.2 kg)   SpO2 98%   BMI 31.93 kg/m  BP Readings from Last 3 Encounters:  05/05/22 122/88  05/01/22 133/68  04/02/22 116/65   Wt  Readings from Last 3 Encounters:  05/05/22 174 lb 9.6 oz (79.2 kg)  03/25/22 176 lb 9.6 oz (80.1 kg)  03/24/22 176 lb 6.4 oz (80 kg)      Physical Exam Vitals and nursing note reviewed.  Constitutional:      Appearance: She is well-developed.  HENT:     Head: Normocephalic and atraumatic.  Eyes:     Conjunctiva/sclera: Conjunctivae normal.  Neck:     Thyroid: No  thyromegaly.     Vascular: No carotid bruit or JVD.  Cardiovascular:     Rate and Rhythm: Normal rate and regular rhythm.     Heart sounds: Normal heart sounds. No murmur heard. Pulmonary:     Effort: Pulmonary effort is normal. No respiratory distress.     Breath sounds: Normal breath sounds. No wheezing or rales.  Chest:     Chest wall: No tenderness.  Musculoskeletal:     Cervical back: Normal range of motion and neck supple.  Neurological:     Mental Status: She is alert and oriented to person, place, and time.     No results found for any visits on 05/05/22.  Last CBC Lab Results  Component Value Date   WBC 6.2 02/02/2022   HGB 13.5 02/02/2022   HCT 42.6 02/02/2022   MCV 95.3 02/02/2022   MCH 30.2 02/02/2022   RDW 13.0 02/02/2022   PLT 70 (L) 14/78/2956   Last metabolic panel Lab Results  Component Value Date   GLUCOSE 160 (H) 02/02/2022   NA 140 02/02/2022   K 4.3 02/02/2022   CL 107 02/02/2022   CO2 25 02/02/2022   BUN 12 02/02/2022   CREATININE 1.15 (H) 02/02/2022   GFRNONAA 52 (L) 02/02/2022   CALCIUM 9.0 02/02/2022   PROT 6.8 02/02/2022   ALBUMIN 4.1 02/02/2022   BILITOT 0.4 02/02/2022   ALKPHOS 80 02/02/2022   AST 15 02/02/2022   ALT 13 02/02/2022   ANIONGAP 8 02/02/2022   Last lipids Lab Results  Component Value Date   CHOL 201 (H) 11/04/2021   HDL 53.00 11/04/2021   LDLCALC 117 (H) 11/04/2021   LDLDIRECT 121.0 09/20/2017   TRIG 158.0 (H) 11/04/2021   CHOLHDL 4 11/04/2021   Last hemoglobin A1c Lab Results  Component Value Date   HGBA1C 5.8 11/04/2021   Last thyroid functions Lab Results  Component Value Date   TSH 2.99 10/18/2020   Last vitamin D Lab Results  Component Value Date   VD25OH 44.83 11/04/2021   Last vitamin B12 and Folate Lab Results  Component Value Date   VITAMINB12 293 02/02/2022      The 10-year ASCVD risk score (Arnett DK, et al., 2019) is: 17.5%    Assessment & Plan:   Problem List Items Addressed  This Visit       Unprioritized   Anxiety   Relevant Medications   ALPRAZolam (XANAX) 1 MG tablet   Other Relevant Orders   Drug Tox Monitor 1 w/Conf, Oral Fld   Hypertension    Well controlled, no changes to meds. Encouraged heart healthy diet such as the DASH diet and exercise as tolerated.        Relevant Orders   CBC with Differential/Platelet   Comprehensive metabolic panel   Lipid panel   Hyperlipidemia    Encourage heart healthy diet such as MIND or DASH diet, increase exercise, avoid trans fats, simple carbohydrates and processed foods, consider a krill or fish or flaxseed oil cap daily.  Relevant Orders   CBC with Differential/Platelet   Comprehensive metabolic panel   Lipid panel   Other Visit Diagnoses     High risk medication use    -  Primary   Relevant Orders   Drug Monitoring Panel [phone] , Urine   Drug Tox Monitor 1 w/Conf, Oral Fld   Vitamin B12 deficiency       Relevant Orders   Vitamin B12   Vitamin D deficiency       Relevant Orders   VITAMIN D 25 Hydroxy (Vit-D Deficiency, Fractures)       No follow-ups on file.    [name], DO

## 2022-05-05 NOTE — Progress Notes (Signed)
Subjective:   By signing my name below, I, [name], attest that this documentation has been prepared under the direction and in the presence of [name], DO. 05/05/2022    Patient ID: Ebony Garcia, female    DOB: [birth year], 71 y.o.   MRN: [id number]  Chief Complaint  Patient presents with   Hypertension   Hyperlipidemia   Diabetes   Follow-up    HPI Patient is in today for a follow up visit.   She reports having bruising following her last B12 injection. She reports the nurse was aggressive with her during the injection.  She reports having no recent headaches.    Past Medical History:  Diagnosis Date   Acute diastolic CHF (congestive heart failure) ([place]) 04/30/2013   8/85-02/77 acute diastolic heart failure  BNP 1528 2 D echocardiogram essentially negative  Chest x-ray revealed cardiomegaly with costophrenic angle blunting and suggestion of possible interstitial vascular accentuation; no frank heart failure present. 10 pound weight loss with 20 mg of furosemide daily  No definite etiology of the cardiomegaly or acute diastolic failure    Anxiety 02/07/2015   Auditory hallucination 10/06/2017   B12 deficiency 10/07/2018   Bradycardia 04/30/2013   Cancer ([place])    Chest pain 05/14/2020   Chest pain with moderate risk of acute coronary syndrome 02/07/2015   CHF (congestive heart failure) ([place])    dystolic heart dysfunction   Chronic kidney disease    De Quervain's tenosynovitis, left 05/23/2013   Depression    Depression with anxiety    Depressive disorder, not elsewhere classified 05/31/2007   Centricity Description: DISORDER, DEPRESSIVE NEC Qualifier: Diagnosis of  By: Linna Darner MD, Towanda Octave, [place]    Diabetes mellitus without complication [facility])    Diet controlled   Dysmetabolic syndrome X 03/18/8785   Qualifier: Diagnosis of  By: Linna Darner MD, [place]  A1c 6.3% in 2/12; TG 278    Essential hypertension 08/09/2008    Qualifier: Diagnosis of  By: Linna Darner MD, William     Estrogen deficiency 02/02/2019   Estrogen deficiency 02/02/2019   Fatigue 02/02/2019   Hair loss 02/02/2019   Hyperlipidemia 02/27/2008   Qualifier: Diagnosis of  By: Linna Darner MD, William     Hypertension    Intertrigo    INTERTRIGO 11/08/2009   Qualifier: Diagnosis of  By: Inda Castle FNP, Melissa S    Intractable migraine with aura without status migrainosus 08/03/2018   Migraine variant 08/15/2008   Qualifier: Diagnosis of  By: Linna Darner MD, William   Onset:initially @ age 42;recurrence age 89 during 1st trimester. Recurrence @ 39. No prodrome or aura Previously on Tryptans & Topamax with temporary relief but tolerance appeared Botox by Dr Leward Quan 09/2010  most effective     Migraines    Myalgia 02/02/2019   NONSPECIFIC ABNORMAL ELECTROCARDIOGRAM 08/15/2008   Qualifier: Diagnosis of  By: Linna Darner MD, William     Palpitation 04/17/2014   Panic attacks    Panic attacks    Polydipsia 11/02/2007   Qualifier: Diagnosis of  By: Linna Darner MD, [place]     RENAL CALCULUS 02/21/2007   Qualifier: Diagnosis of  By: Allen Norris     Squamous cell carcinoma in situ of skin 12/26/2015   Thrombocytopenia ([place]) 10/06/2017   Viral syndrome 04/10/2019   WEIGHT GAIN 11/02/2007   Qualifier: Diagnosis of  By: Linna Darner MD, Gwyndolyn Saxon      Past Surgical History:  Procedure Laterality Date   ABDOMINAL HYSTERECTOMY  BREAST SURGERY     DILATION AND CURETTAGE OF UTERUS     NASAL SEPTUM SURGERY     papaloma Right    TONSILLECTOMY AND ADENOIDECTOMY     TOTAL ABDOMINAL HYSTERECTOMY W/ BILATERAL SALPINGOOPHORECTOMY     for fibroids and migraines   TUBAL LIGATION      Family History  Problem Relation Age of Onset   Crohn's disease Mother    Atrial fibrillation Mother    Heart failure Mother    Bladder Cancer Father    Cancer Father        bladder   Bipolar disorder Brother    Cancer Brother    Bipolar disorder Sister    Heart attack Maternal Grandmother         >65   Cancer Maternal Aunt    Colon cancer Maternal Uncle    Cancer Paternal Grandfather    Cancer Maternal Aunt    Cancer Cousin    Anemia Cousin    Diabetes Neg Hx    Stroke Neg Hx     Social History   Socioeconomic History   Marital status: Divorced    Spouse name: Not on file   Number of children: 3   Years of education: Not on file   Highest education level: Not on file  Occupational History    Employer: UNEMPLOYED  Tobacco Use   Smoking status: Never   Smokeless tobacco: Never  Vaping Use   Vaping Use: Never used  Substance and Sexual Activity   Alcohol use: No    Alcohol/week: 0.0 standard drinks   Drug use: No   Sexual activity: Not Currently    Partners: Male  Other Topics Concern   Not on file  Social History Narrative   Lives alone.        Patient is right-handed. She lives on the first floor.      Drinks caffeine   Social Determinants of Radio broadcast assistant Strain: Low Risk    Difficulty of Paying Living Expenses: Not hard at all  Food Insecurity: No Food Insecurity   Worried About Charity fundraiser in the Last Year: Never true   Arboriculturist in the Last Year: Never true  Transportation Needs: No Transportation Needs   Lack of Transportation (Medical): No   Lack of Transportation (Non-Medical): No  Physical Activity: Inactive   Days of Exercise per Week: 0 days   Minutes of Exercise per Session: 0 min  Stress: No Stress Concern Present   Feeling of Stress : Not at all  Social Connections: Socially Isolated   Frequency of Communication with Friends and Family: More than three times a week   Frequency of Social Gatherings with Friends and Family: Twice a week   Attends Religious Services: Never   Marine scientist or Organizations: No   Attends Music therapist: Never   Marital Status: Divorced  Human resources officer Violence: Not At Risk   Fear of Current or Ex-Partner: No   Emotionally Abused: No   Physically  Abused: No   Sexually Abused: No    Outpatient Medications Prior to Visit  Medication Sig Dispense Refill   ALPRAZolam (XANAX) 1 MG tablet TAKE 1 TABLET BY MOUTH EVERY DAY AT BEDTIME AS NEEDED FOR SLEEP 30 tablet 0   aspirin EC 81 MG tablet Take 1 tablet (81 mg total) by mouth daily. Swallow whole. 30 tablet 11   FLUoxetine (PROZAC) 20 MG capsule TAKE 1 CAPSULE  EVERY DAY 90 capsule 1   furosemide (LASIX) 20 MG tablet TAKE 1 TABLET EVERY DAY 90 tablet 1   losartan (COZAAR) 50 MG tablet TAKE 1 TABLET EVERY DAY 90 tablet 1   metoCLOPramide (REGLAN) 10 MG tablet Take 1 tablet (10 mg total) by mouth every 6 (six) hours as needed for nausea. 20 tablet 5   topiramate (TOPAMAX) 100 MG tablet TAKE 1 TABLET TWICE DAILY 180 tablet 0   triamcinolone ointment (KENALOG) 0.1 % Apply topically as needed     fluticasone (FLONASE) 50 MCG/ACT nasal spray Place 2 sprays into both nostrils daily. 16 g 6   No facility-administered medications prior to visit.    Allergies  Allergen Reactions   Amoxicillin-Pot Clavulanate Diarrhea and Other (See Comments)   Antihistamines, Diphenhydramine-Type Other (See Comments)    Other Reaction: makes pt nervous   Diphenhydramine Hcl Other (See Comments)    Other Reaction: makes pt nervous   Fremanezumab-Vfrm Rash and Other (See Comments)    Seizure, jerking   Other Other (See Comments)   Tramadol Hcl Other (See Comments)   Clindamycin Other (See Comments)    She has not taken but per patient  "her mother had a reaction and she does not want it ever"   Hydrocodone-Acetaminophen Other (See Comments)    REACTION: sensitive to  but can take for extreme pain per patient   Lisinopril     Restless , irregular sleep   Nortriptyline Other (See Comments)    UNKNOWN REACTION   Propoxyphene N-Acetaminophen Nausea Only   Sulfa Antibiotics Other (See Comments)    Unknown reaction   Tramadol Other (See Comments)    UNKNOWN REACTION    ROS     Objective:    Physical  Exam  BP 122/88 (BP Location: Left Arm, Patient Position: Sitting, Cuff Size: Normal)   Pulse 81   Temp 98.2 F (36.8 C) (Oral)   Resp 18   Ht '5\' 2"'$  (1.575 m)   Wt 174 lb 9.6 oz (79.2 kg)   SpO2 98%   BMI 31.93 kg/m  Wt Readings from Last 3 Encounters:  05/05/22 174 lb 9.6 oz (79.2 kg)  03/25/22 176 lb 9.6 oz (80.1 kg)  03/24/22 176 lb 6.4 oz (80 kg)    Diabetic Foot Exam - Simple   No data filed    Lab Results  Component Value Date   WBC 6.2 02/02/2022   HGB 13.5 02/02/2022   HCT 42.6 02/02/2022   PLT 70 (L) 02/02/2022   GLUCOSE 160 (H) 02/02/2022   CHOL 201 (H) 11/04/2021   TRIG 158.0 (H) 11/04/2021   HDL 53.00 11/04/2021   LDLDIRECT 121.0 09/20/2017   LDLCALC 117 (H) 11/04/2021   ALT 13 02/02/2022   AST 15 02/02/2022   NA 140 02/02/2022   K 4.3 02/02/2022   CL 107 02/02/2022   CREATININE 1.15 (H) 02/02/2022   BUN 12 02/02/2022   CO2 25 02/02/2022   TSH 2.99 10/18/2020   INR 0.93 07/19/2010   HGBA1C 5.8 11/04/2021   MICROALBUR <0.7 11/04/2021    Lab Results  Component Value Date   TSH 2.99 10/18/2020   Lab Results  Component Value Date   WBC 6.2 02/02/2022   HGB 13.5 02/02/2022   HCT 42.6 02/02/2022   MCV 95.3 02/02/2022   PLT 70 (L) 02/02/2022   Lab Results  Component Value Date   NA 140 02/02/2022   K 4.3 02/02/2022   CO2 25 02/02/2022   GLUCOSE  160 (H) 02/02/2022   BUN 12 02/02/2022   CREATININE 1.15 (H) 02/02/2022   BILITOT 0.4 02/02/2022   ALKPHOS 80 02/02/2022   AST 15 02/02/2022   ALT 13 02/02/2022   PROT 6.8 02/02/2022   ALBUMIN 4.1 02/02/2022   CALCIUM 9.0 02/02/2022   ANIONGAP 8 02/02/2022   GFR 61.74 11/04/2021   Lab Results  Component Value Date   CHOL 201 (H) 11/04/2021   Lab Results  Component Value Date   HDL 53.00 11/04/2021   Lab Results  Component Value Date   LDLCALC 117 (H) 11/04/2021   Lab Results  Component Value Date   TRIG 158.0 (H) 11/04/2021   Lab Results  Component Value Date   CHOLHDL 4  11/04/2021   Lab Results  Component Value Date   HGBA1C 5.8 11/04/2021       Assessment & Plan:   Problem List Items Addressed This Visit       Other   Anxiety   Other Visit Diagnoses     High risk medication use    -  Primary        No orders of the defined types were placed in this encounter.   I, Shehryar [place], personally preformed the services described in this documentation.  All medical record entries made by the scribe were at my direction and in my presence.  I have reviewed the chart and discharge instructions (if applicable) and agree that the record reflects my personal performance and is accurate and complete. 05/05/2022   I,[name],acting as a scribe for [name], DO.,have documented all relevant documentation on the behalf of [name], DO,as directed by  [name], DO while in the presence of [name], DO.   Shehryar [employer]

## 2022-05-06 LAB — COMPREHENSIVE METABOLIC PANEL
ALT: 11 U/L (ref 0–35)
AST: 14 U/L (ref 0–37)
Albumin: 4.3 g/dL (ref 3.5–5.2)
Alkaline Phosphatase: 88 U/L (ref 39–117)
BUN: 11 mg/dL (ref 6–23)
CO2: 26 mEq/L (ref 19–32)
Calcium: 9 mg/dL (ref 8.4–10.5)
Chloride: 106 mEq/L (ref 96–112)
Creatinine, Ser: 1.05 mg/dL (ref 0.40–1.20)
GFR: 54.56 mL/min — ABNORMAL LOW (ref >60.00)
Glucose, Bld: 113 mg/dL — ABNORMAL HIGH (ref 70–99)
Potassium: 3.9 mEq/L (ref 3.5–5.1)
Sodium: 141 mEq/L (ref 135–145)
Total Bilirubin: 0.5 mg/dL (ref 0.2–1.2)
Total Protein: 6.5 g/dL (ref 6.0–8.3)

## 2022-05-06 LAB — LIPID PANEL
Cholesterol: 192 mg/dL (ref 0–200)
HDL: 53.7 mg/dL (ref >39.00)
LDL Cholesterol: 100 mg/dL — ABNORMAL HIGH (ref 0–99)
NonHDL: 138.5
Total CHOL/HDL Ratio: 4
Triglycerides: 193 mg/dL — ABNORMAL HIGH (ref 0.0–149.0)
VLDL: 38.6 mg/dL (ref 0.0–40.0)

## 2022-05-06 LAB — CBC WITH DIFFERENTIAL/PLATELET
Basophils Absolute: 0 10*3/uL (ref 0.0–0.1)
Basophils Relative: 0.7 % (ref 0.0–3.0)
Eosinophils Absolute: 0.1 10*3/uL (ref 0.0–0.7)
Eosinophils Relative: 2 % (ref 0.0–5.0)
HCT: 41 % (ref 36.0–46.0)
Hemoglobin: 13.5 g/dL (ref 12.0–15.0)
Lymphocytes Relative: 22.3 % (ref 12.0–46.0)
Lymphs Abs: 1.2 10*3/uL (ref 0.7–4.0)
MCHC: 32.9 g/dL (ref 30.0–36.0)
MCV: 93.8 fl (ref 78.0–100.0)
Monocytes Absolute: 0.4 10*3/uL (ref 0.1–1.0)
Monocytes Relative: 7.7 % (ref 3.0–12.0)
Neutro Abs: 3.8 10*3/uL (ref 1.4–7.7)
Neutrophils Relative %: 67.3 % (ref 43.0–77.0)
Platelets: 78 10*3/uL — ABNORMAL LOW (ref 150.0–400.0)
RBC: 4.37 Mil/uL (ref 3.87–5.11)
RDW: 13.4 % (ref 11.5–15.5)
WBC: 5.6 10*3/uL (ref 4.0–10.5)

## 2022-05-06 LAB — VITAMIN B12: Vitamin B-12: 669 pg/mL (ref 211–911)

## 2022-05-06 LAB — VITAMIN D 25 HYDROXY (VIT D DEFICIENCY, FRACTURES): VITD: 39.74 ng/mL (ref 30.00–100.00)

## 2022-05-09 LAB — DRUG TOX MONITOR 1 W/CONF, ORAL FLD

## 2022-06-02 ENCOUNTER — Telehealth: Payer: Self-pay | Admitting: *Deleted

## 2022-06-02 ENCOUNTER — Inpatient Hospital Stay: Payer: Medicare HMO

## 2022-06-02 NOTE — Telephone Encounter (Signed)
Message received from patient wanting to know if she needs to come in today for the Vitamin B-12 injection d/t her last Vit B-12 level was 669.  Pt informed per order of S. Montez Morita NP that she can hold on Vit B-12 injections for June and July.  Pt appreciative of call and has no further questions at this time.

## 2022-06-11 ENCOUNTER — Other Ambulatory Visit: Payer: Self-pay | Admitting: Family Medicine

## 2022-06-11 DIAGNOSIS — F419 Anxiety disorder, unspecified: Secondary | ICD-10-CM

## 2022-06-19 ENCOUNTER — Other Ambulatory Visit: Payer: Self-pay | Admitting: Neurology

## 2022-06-23 ENCOUNTER — Ambulatory Visit (INDEPENDENT_AMBULATORY_CARE_PROVIDER_SITE_OTHER): Payer: Medicare HMO

## 2022-06-23 VITALS — BP 128/79 | HR 70 | Temp 98.5°F | Resp 18 | Ht 62.0 in | Wt 174.2 lb

## 2022-06-23 DIAGNOSIS — G43119 Migraine with aura, intractable, without status migrainosus: Secondary | ICD-10-CM

## 2022-06-23 MED ORDER — SODIUM CHLORIDE 0.9 % IV SOLN
100.0000 mg | Freq: Once | INTRAVENOUS | Status: AC
  Administered 2022-06-23: 100 mg via INTRAVENOUS
  Filled 2022-06-23: qty 1

## 2022-06-23 NOTE — Progress Notes (Signed)
Diagnosis: Intractable Migraine  Provider:  Marshell Garfinkel, MD  Procedure: Infusion  IV Type: Peripheral, IV Location: L Antecubital  Vyepti (Eptinezumab-jjmr), Dose: 100 mg  Infusion Start Time: 9191  Infusion Stop Time: 6606  Post Infusion IV Care: Peripheral IV Discontinued  Discharge: Condition: Good, Destination: Home . AVS provided to patient.   Performed by:  Arnoldo Morale, RN

## 2022-07-02 ENCOUNTER — Ambulatory Visit: Payer: Medicare HMO

## 2022-07-24 ENCOUNTER — Other Ambulatory Visit: Payer: Self-pay | Admitting: Family Medicine

## 2022-07-24 DIAGNOSIS — F419 Anxiety disorder, unspecified: Secondary | ICD-10-CM

## 2022-07-24 NOTE — Telephone Encounter (Signed)
Requesting: alprazolam '1mg'$   Contract: 05/05/22 UDS: 05/05/22 Last Visit: 05/05/22 Next Visit: 11/05/22 Last Refill: 06/11/22 #30 and 0RF  Please Advise

## 2022-08-03 ENCOUNTER — Other Ambulatory Visit: Payer: Medicare HMO

## 2022-08-03 ENCOUNTER — Inpatient Hospital Stay: Payer: Medicare HMO

## 2022-08-03 ENCOUNTER — Inpatient Hospital Stay: Payer: Medicare HMO | Attending: Family | Admitting: Family

## 2022-08-03 ENCOUNTER — Encounter: Payer: Self-pay | Admitting: Family

## 2022-08-03 VITALS — BP 128/80 | HR 72 | Temp 98.4°F | Resp 17 | Wt 174.1 lb

## 2022-08-03 DIAGNOSIS — D6959 Other secondary thrombocytopenia: Secondary | ICD-10-CM | POA: Insufficient documentation

## 2022-08-03 DIAGNOSIS — E538 Deficiency of other specified B group vitamins: Secondary | ICD-10-CM

## 2022-08-03 DIAGNOSIS — G629 Polyneuropathy, unspecified: Secondary | ICD-10-CM | POA: Diagnosis not present

## 2022-08-03 DIAGNOSIS — D696 Thrombocytopenia, unspecified: Secondary | ICD-10-CM

## 2022-08-03 LAB — CBC WITH DIFFERENTIAL (CANCER CENTER ONLY)
Abs Immature Granulocytes: 0.02 10*3/uL (ref 0.00–0.07)
Basophils Absolute: 0.1 10*3/uL (ref 0.0–0.1)
Basophils Relative: 1 %
Eosinophils Absolute: 0.1 10*3/uL (ref 0.0–0.5)
Eosinophils Relative: 2 %
HCT: 43.4 % (ref 36.0–46.0)
Hemoglobin: 13.6 g/dL (ref 12.0–15.0)
Immature Granulocytes: 0 %
Lymphocytes Relative: 24 %
Lymphs Abs: 1.5 10*3/uL (ref 0.7–4.0)
MCH: 30.4 pg (ref 26.0–34.0)
MCHC: 31.3 g/dL (ref 30.0–36.0)
MCV: 96.9 fL (ref 80.0–100.0)
Monocytes Absolute: 0.4 10*3/uL (ref 0.1–1.0)
Monocytes Relative: 6 %
Neutro Abs: 4.3 10*3/uL (ref 1.7–7.7)
Neutrophils Relative %: 67 %
Platelet Count: 67 10*3/uL — ABNORMAL LOW (ref 150–400)
RBC: 4.48 MIL/uL (ref 3.87–5.11)
RDW: 13.2 % (ref 11.5–15.5)
WBC Count: 6.4 10*3/uL (ref 4.0–10.5)
nRBC: 0 % (ref 0.0–0.2)

## 2022-08-03 LAB — CMP (CANCER CENTER ONLY)
ALT: 17 U/L (ref 0–44)
AST: 21 U/L (ref 15–41)
Albumin: 4.1 g/dL (ref 3.5–5.0)
Alkaline Phosphatase: 80 U/L (ref 38–126)
Anion gap: 5 (ref 5–15)
BUN: 12 mg/dL (ref 8–23)
CO2: 24 mmol/L (ref 22–32)
Calcium: 8.6 mg/dL — ABNORMAL LOW (ref 8.9–10.3)
Chloride: 110 mmol/L (ref 98–111)
Creatinine: 1.01 mg/dL — ABNORMAL HIGH (ref 0.44–1.00)
GFR, Estimated: >60 mL/min (ref >60)
Glucose, Bld: 138 mg/dL — ABNORMAL HIGH (ref 70–99)
Potassium: 4.3 mmol/L (ref 3.5–5.1)
Sodium: 139 mmol/L (ref 135–145)
Total Bilirubin: 0.4 mg/dL (ref 0.3–1.2)
Total Protein: 7.1 g/dL (ref 6.5–8.1)

## 2022-08-03 LAB — VITAMIN B12: Vitamin B-12: 324 pg/mL (ref 180–914)

## 2022-08-03 MED ORDER — CYANOCOBALAMIN 1000 MCG/ML IJ SOLN
1000.0000 ug | Freq: Once | INTRAMUSCULAR | Status: AC
  Administered 2022-08-03: 1000 ug via INTRAMUSCULAR
  Filled 2022-08-03: qty 1

## 2022-08-03 NOTE — Progress Notes (Unsigned)
Hematology and Oncology Follow Up Visit  Ebony Garcia [id number] 1951-05-01 71 y.o. 08/03/2022   Principle Diagnosis:  Medication induced thrombocytopenia B 12 deficiency    Current Therapy:        Observation B 12 injection monthly   Interim History:  Ebony Garcia is here today for follow-up and B 12 injection. She is doing quite well but still notes some fatigue at times.  She has reconnected with her children and grandchildren.  She has also been getting infusions to help with migraines and this has had a tremendous benefit. She has not had a migraine since the spring.  No fever, chills, n/v, cough, rash, dizziness, SOB, abdominal pain or changes in bowel or bladder habits.  She has occasional chest discomfort and palpitations which she associates with anxiety.  No swelling or tenderness in her extremities at this time.  Neuropathy in her hands and feet unchanged from baseline.  No falls or syncope reported.  Appetite and hydration are good. Her weight is stable at 174 lbs.   ECOG Performance Status: 1 - Symptomatic but completely ambulatory  Medications:  Allergies as of 08/03/2022       Reactions   Amoxicillin-pot Clavulanate Diarrhea, Other (See Comments)   Antihistamines, Diphenhydramine-type Other (See Comments)   Other Reaction: makes pt nervous   Diphenhydramine Hcl Other (See Comments)   Other Reaction: makes pt nervous   Fremanezumab-vfrm Rash, Other (See Comments)   Seizure, jerking   Other Other (See Comments)   Tramadol Hcl Other (See Comments)   Clindamycin Other (See Comments)   She has not taken but per patient  "her mother had a reaction and she does not want it ever"   Hydrocodone-acetaminophen Other (See Comments)   REACTION: sensitive to  but can take for extreme pain per patient   Lisinopril    Restless , irregular sleep   Nortriptyline Other (See Comments)   UNKNOWN REACTION   Propoxyphene N-acetaminophen Nausea Only   Sulfa Antibiotics  Other (See Comments)   Unknown reaction   Tramadol Other (See Comments)   UNKNOWN REACTION        Medication List        Accurate as of August 03, 2022  2:39 PM. If you have any questions, ask your nurse or doctor.          ALPRAZolam 1 MG tablet Commonly known as: XANAX TAKE 1 TABLET BY MOUTH EVERY DAY AT BEDTIME AS NEEDED FOR SLEEP   aspirin EC 81 MG tablet Take 1 tablet (81 mg total) by mouth daily. Swallow whole.   FLUoxetine 20 MG capsule Commonly known as: PROZAC TAKE 1 CAPSULE EVERY DAY   furosemide 20 MG tablet Commonly known as: LASIX TAKE 1 TABLET EVERY DAY   losartan 50 MG tablet Commonly known as: COZAAR TAKE 1 TABLET EVERY DAY   metoCLOPramide 10 MG tablet Commonly known as: Reglan Take 1 tablet (10 mg total) by mouth every 6 (six) hours as needed for nausea.   topiramate 100 MG tablet Commonly known as: TOPAMAX TAKE 1 TABLET TWICE DAILY   triamcinolone ointment 0.1 % Commonly known as: KENALOG Apply topically as needed        Allergies:  Allergies  Allergen Reactions   Amoxicillin-Pot Clavulanate Diarrhea and Other (See Comments)   Antihistamines, Diphenhydramine-Type Other (See Comments)    Other Reaction: makes pt nervous   Diphenhydramine Hcl Other (See Comments)    Other Reaction: makes pt nervous   Fremanezumab-Vfrm Rash and Other (  See Comments)    Seizure, jerking   Other Other (See Comments)   Tramadol Hcl Other (See Comments)   Clindamycin Other (See Comments)    She has not taken but per patient  "her mother had a reaction and she does not want it ever"   Hydrocodone-Acetaminophen Other (See Comments)    REACTION: sensitive to  but can take for extreme pain per patient   Lisinopril     Restless , irregular sleep   Nortriptyline Other (See Comments)    UNKNOWN REACTION   Propoxyphene N-Acetaminophen Nausea Only   Sulfa Antibiotics Other (See Comments)    Unknown reaction   Tramadol Other (See Comments)    UNKNOWN  REACTION    Past Medical History, Surgical history, Social history, and Family History were reviewed and updated.  Review of Systems: All other 10 point review of systems is negative.   Physical Exam:  vitals were not taken for this visit.   Wt Readings from Last 3 Encounters:  06/23/22 174 lb 3.2 oz (79 kg)  05/05/22 174 lb 9.6 oz (79.2 kg)  03/25/22 176 lb 9.6 oz (80.1 kg)    Ocular: Sclerae unicteric, pupils equal, round and reactive to light Ear-nose-throat: Oropharynx clear, dentition fair Lymphatic: No cervical or supraclavicular adenopathy Lungs no rales or rhonchi, good excursion bilaterally Heart regular rate and rhythm, no murmur appreciated Abd soft, nontender, positive bowel sounds MSK no focal spinal tenderness, no joint edema Neuro: non-focal, well-oriented, appropriate affect Breasts: Deferred   Lab Results  Component Value Date   WBC 5.6 05/05/2022   HGB 13.5 05/05/2022   HCT 41.0 05/05/2022   MCV 93.8 05/05/2022   PLT 78.0 (L) 05/05/2022   No results found for: "FERRITIN", "IRON", "TIBC", "UIBC", "IRONPCTSAT" Lab Results  Component Value Date   RBC 4.37 05/05/2022   No results found for: "KPAFRELGTCHN", "LAMBDASER", "KAPLAMBRATIO" No results found for: "IGGSERUM", "IGA", "IGMSERUM" No results found for: "TOTALPROTELP", "ALBUMINELP", "A1GS", "A2GS", "BETS", "BETA2SER", "GAMS", "MSPIKE", "SPEI"   Chemistry      Component Value Date/Time   NA 141 05/05/2022 1522   NA 143 11/11/2017 1339   K 3.9 05/05/2022 1522   K 3.6 11/11/2017 1339   CL 106 05/05/2022 1522   CL 109 (H) 11/11/2017 1339   CO2 26 05/05/2022 1522   CO2 24 11/11/2017 1339   BUN 11 05/05/2022 1522   BUN 9 11/11/2017 1339   CREATININE 1.05 05/05/2022 1522   CREATININE 1.15 (H) 02/02/2022 1249   CREATININE 1.00 (H) 10/18/2020 1433      Component Value Date/Time   CALCIUM 9.0 05/05/2022 1522   CALCIUM 9.3 11/11/2017 1339   ALKPHOS 88 05/05/2022 1522   ALKPHOS 85 (H) 11/11/2017  1339   AST 14 05/05/2022 1522   AST 15 02/02/2022 1249   ALT 11 05/05/2022 1522   ALT 13 02/02/2022 1249   ALT 24 11/11/2017 1339   BILITOT 0.5 05/05/2022 1522   BILITOT 0.4 02/02/2022 1249       Impression and Plan: Ebony Garcia is a pleasant 71 yo caucasian female with thrombocytopenia and B12 deficiency.  B 12 injection given.  Monthly B 12 injection every other month.  Follow-up in 8 months.    Lottie Dawson, NP 8/28/20232:39 PM

## 2022-08-03 NOTE — Patient Instructions (Signed)
Vitamin B12 Deficiency Vitamin B12 deficiency means that your body does not have enough vitamin B12. The body needs this important vitamin: To make red blood cells. To make genes (DNA). To help the nerves work. If you do not have enough vitamin B12 in your body, you can have health problems, such as not having enough red blood cells in the blood (anemia). What are the causes? Not eating enough foods that contain vitamin B12. Not being able to take in (absorb) vitamin B12 from the food that you eat. Certain diseases. A condition in which the body does not make enough of a certain protein. This results in your body not taking in enough vitamin B12. Having a surgery in which part of the stomach or small intestine is taken out. Taking medicines that make it hard for the body to take in vitamin B12. These include: Heartburn medicines. Some medicines that are used to treat diabetes. What increases the risk? Being an older adult. Eating a vegetarian or vegan diet that does not include any foods that come from animals. Not eating enough foods that contain vitamin B12 while you are pregnant. Taking certain medicines. Having alcoholism. What are the signs or symptoms? In some cases, there are no symptoms. If the condition leads to too few blood cells or nerve damage, symptoms can occur, such as: Feeling weak or tired. Not being hungry. Losing feeling (numbness) or tingling in your hands and feet. Redness and burning of the tongue. Feeling sad (depressed). Confusion or memory problems. Trouble walking. If anemia is very bad, symptoms can include: Being short of breath. Being dizzy. Having a very fast heartbeat. How is this treated? Changing the way you eat and drink, such as: Eating more foods that contain vitamin B12. Drinking little or no alcohol. Getting vitamin B12 shots. Taking vitamin B12 supplements by mouth (orally). Your doctor will tell you the dose that is best for you. Follow  these instructions at home: Eating and drinking  Eat foods that come from animals and have a lot of vitamin B12 in them. These include: Meats and poultry. This includes beef, pork, chicken, turkey, and organ meats, such as liver. Seafood, such as clams, rainbow trout, salmon, tuna, and haddock. Eggs. Dairy foods such as milk, yogurt, and cheese. Eat breakfast cereals that have vitamin B12 added to them (are fortified). Check the label. The items listed above may not be a complete list of foods and beverages you can eat and drink. Contact a dietitian for more information. Alcohol use Do not drink alcohol if: Your doctor tells you not to drink. You are pregnant, may be pregnant, or are planning to become pregnant. If you drink alcohol: Limit how much you have to: 0-1 drink a day for women. 0-2 drinks a day for men. Know how much alcohol is in your drink. In the [place] one drink equals one 12 oz bottle of beer (355 mL), one 5 oz glass of wine (148 mL), or one 1 oz glass of hard liquor (44 mL). General instructions Get any vitamin B12 shots if told by your doctor. Take supplements only as told by your doctor. Follow the directions. Keep all follow-up visits. Contact a doctor if: Your symptoms come back. Your symptoms get worse or do not get better with treatment. Get help right away if: You have trouble breathing. You have a very fast heartbeat. You have chest pain. You get dizzy. You faint. These symptoms may be an emergency. Get help right away. Call 911.   Do not wait to see if the symptoms will go away. Do not drive yourself to the hospital. Summary Vitamin B12 deficiency means that your body is not getting enough of the vitamin. In some cases, there are no symptoms of this condition. Treatment may include making a change in the way you eat and drink, getting shots, or taking supplements. Eat foods that have vitamin B12 in them. This information is not intended to replace advice  given to you by your health care provider. Make sure you discuss any questions you have with your health care provider. Document Revised: 07/18/2021 Document Reviewed: 07/18/2021 Elsevier Patient Education  2023 [employer]  

## 2022-08-04 ENCOUNTER — Encounter: Payer: Self-pay | Admitting: Neurology

## 2022-08-04 ENCOUNTER — Encounter: Payer: Self-pay | Admitting: Family

## 2022-09-02 ENCOUNTER — Other Ambulatory Visit: Payer: Self-pay | Admitting: Family Medicine

## 2022-09-02 DIAGNOSIS — F419 Anxiety disorder, unspecified: Secondary | ICD-10-CM

## 2022-09-03 ENCOUNTER — Inpatient Hospital Stay: Payer: Medicare HMO | Attending: Family

## 2022-09-03 VITALS — BP 115/66 | HR 69 | Temp 97.9°F | Resp 18

## 2022-09-03 DIAGNOSIS — D6959 Other secondary thrombocytopenia: Secondary | ICD-10-CM | POA: Diagnosis not present

## 2022-09-03 DIAGNOSIS — E538 Deficiency of other specified B group vitamins: Secondary | ICD-10-CM | POA: Diagnosis not present

## 2022-09-03 MED ORDER — CYANOCOBALAMIN 1000 MCG/ML IJ SOLN
1000.0000 ug | Freq: Once | INTRAMUSCULAR | Status: AC
  Administered 2022-09-03: 1000 ug via INTRAMUSCULAR
  Filled 2022-09-03: qty 1

## 2022-09-03 NOTE — Telephone Encounter (Signed)
Requesting: alprazolam '1mg'$   Contract: 05/05/22 UDS: 05/05/22 Last Visit: 05/05/22 Next Visit: 11/05/22 Last Refill: 07/24/22 #30 and 0RF  Please Advise

## 2022-09-03 NOTE — Patient Instructions (Signed)
Vitamin B12 Injection What is this medication? Vitamin B12 (VAHY tuh min B12) prevents and treats low vitamin B12 levels in your body. It is used in people who do not get enough vitamin B12 from their diet or when their digestive tract does not absorb enough. Vitamin B12 plays an important role in maintaining the health of your nervous system and red blood cells. This medicine may be used for other purposes; ask your health care provider or pharmacist if you have questions. COMMON BRAND NAME(S): B-12 Compliance Kit, B-12 Injection Kit, Cyomin, Dodex, LA-12, Nutri-Twelve, Physicians EZ Use B-12, Primabalt What should I tell my care team before I take this medication? They need to know if you have any of these conditions: Kidney disease Leber's disease Megaloblastic anemia An unusual or allergic reaction to cyanocobalamin, cobalt, other medications, foods, dyes, or preservatives Pregnant or trying to get pregnant Breast-feeding How should I use this medication? This medication is injected into a muscle or deeply under the skin. It is usually given in a clinic or care team's office. However, your care team may teach you how to inject yourself. Follow all instructions. Talk to your care team about the use of this medication in children. Special care may be needed. Overdosage: If you think you have taken too much of this medicine contact a poison control center or emergency room at once. NOTE: This medicine is only for you. Do not share this medicine with others. What if I miss a dose? If you are given your dose at a clinic or care team's office, call to reschedule your appointment. If you give your own injections, and you miss a dose, take it as soon as you can. If it is almost time for your next dose, take only that dose. Do not take double or extra doses. What may interact with this medication? Alcohol Colchicine This list may not describe all possible interactions. Give your health care  provider a list of all the medicines, herbs, non-prescription drugs, or dietary supplements you use. Also tell them if you smoke, drink alcohol, or use illegal drugs. Some items may interact with your medicine. What should I watch for while using this medication? Visit your care team regularly. You may need blood work done while you are taking this medication. You may need to follow a special diet. Talk to your care team. Limit your alcohol intake and avoid smoking to get the best benefit. What side effects may I notice from receiving this medication? Side effects that you should report to your care team as soon as possible: Allergic reactions--skin rash, itching, hives, swelling of the face, lips, tongue, or throat Swelling of the ankles, hands, or feet Trouble breathing Side effects that usually do not require medical attention (report to your care team if they continue or are bothersome): Diarrhea This list may not describe all possible side effects. Call your doctor for medical advice about side effects. You may report side effects to FDA at 1-800-FDA-1088. Where should I keep my medication? Keep out of the reach of children. Store at room temperature between 15 and 30 degrees C (59 and 85 degrees F). Protect from light. Throw away any unused medication after the expiration date. NOTE: This sheet is a summary. It may not cover all possible information. If you have questions about this medicine, talk to your doctor, pharmacist, or health care provider.  2023 Elsevier/Gold Standard (2021-01-30 00:00:00)

## 2022-09-22 ENCOUNTER — Ambulatory Visit (INDEPENDENT_AMBULATORY_CARE_PROVIDER_SITE_OTHER): Payer: Medicare HMO

## 2022-09-22 VITALS — BP 106/71 | HR 61 | Temp 97.7°F | Resp 18 | Ht 62.0 in | Wt 175.0 lb

## 2022-09-22 DIAGNOSIS — G43119 Migraine with aura, intractable, without status migrainosus: Secondary | ICD-10-CM | POA: Diagnosis not present

## 2022-09-22 MED ORDER — SODIUM CHLORIDE 0.9 % IV SOLN
100.0000 mg | Freq: Once | INTRAVENOUS | Status: AC
  Administered 2022-09-22: 100 mg via INTRAVENOUS
  Filled 2022-09-22: qty 1

## 2022-09-22 NOTE — Progress Notes (Signed)
Diagnosis: Intractable Migraine  Provider:  Marshell Garfinkel MD  Procedure: Infusion  IV Type: Peripheral, IV Location: L Antecubital  Vyepti (Eptinezumab-jjmr), Dose: 100 mg  Infusion Start Time: 6979  Infusion Stop Time: 1440  Post Infusion IV Care: Peripheral IV Discontinued  Discharge: Condition: Good, Destination: Home . AVS provided to patient.   Performed by:  Arnoldo Morale, RN

## 2022-09-30 ENCOUNTER — Ambulatory Visit: Payer: Medicare HMO | Admitting: Neurology

## 2022-10-05 NOTE — Progress Notes (Unsigned)
Virtual Visit via Video Note  Consent was obtained for video visit:  Yes.   Answered questions that patient had about telehealth interaction:  Yes.   I discussed the limitations, risks, security and privacy concerns of performing an evaluation and management service by telemedicine. I also discussed with the patient that there may be a patient responsible charge related to this service. The patient expressed understanding and agreed to proceed.  Pt location: Home Physician Location: office Name of referring provider:  [name], * I connected with Ebony Garcia at patients initiation/request on 10/06/2022 at  3:30 PM EDT by video enabled telemedicine application and verified that I am speaking with the correct person using two identifiers. Pt MRN:  [id number] Pt DOB:  1951-03-17 Video Participants:  Ebony Garcia   Assessment/Plan:   Migraine without aura, without status migrainosus, not intractable     Migraine prevention:  Vyepti '100mg'$ , topiramate '100mg'$  twice daily For nausea: metoclopramide '10mg'$  Limit use of pain relievers to no more than 2 days out of week to prevent risk of rebound or medication-overuse headache. Keep headache diary Follow up 6 months     Subjective:  Ebony Garcia is a 71 year old right-handed woman with hypertension, CHF, B12 deficiency, depression and hyperlipidemia who follows up for migraines.   UPDATE She was having headaches daily.  Received first dose of Vyepti yesterday. Promethazine isn't as effective as she hoped.  Reglan previously was helpful.    Current NSAIDS:  no Current analgesics:  no. Current triptans:   none Current anti-emetic:  metoclopramide '10mg'$  Current muscle relaxants:  no Current anti-anxiolytic:  Xanax '1mg'$  Current sleep aide:  Xanax '1mg'$  Current Antihypertensive medications:  losartan, Lasix Current Antidepressant medications:  Prozac '20mg'$  Current Anticonvulsant medications:  topiramate '100mg'$   BID Current anti-CGRP:  Vyepti '100mg'$  (started yesterday)   Depression and anxiety:  Yes She reports off and on body aches, paresthesias, head pains. Reports drinking 64 oz water daily Sleep:  She says she sleeps well.  However, daytime fatigue.  Ordered sleep study.  She never received a call to schedule.   HISTORY: Onset:  Since her late 20s. Location: usually left sided, periorbital, and back of head.  More recently, holocephalic Quality:  Sharp, aching pressure.  Non-throbbing Initial intensity:  7/10; Sept: 7/10 Associated symptoms:  Nausea, photophobia and phonophobia.  No visual disturbance, osmophobia, vomiting or unilateral numbness or weakness Aura:  no Initial duration:  3 days; Sept: 30 minutes with Maxalt Initial frequency:  25 headache days per month Triggers:  Emotional stress Relieving factors:  Cold mask, heating pack on neck and shoulders   Past NSAIDs:  naproxen (ineffective), indomethacin, toradol tablet (ineffective), Toradol '60mg'$  IM (effective), Cambia (side effects) Past analgesics:  Tylenol (ineffective), Excedrin (ineffective), Lidocaine nasal drops (effective but caused hives), tramadol (side effects), Midrin (effective but made her sleepy) Past triptans/ergots:  sumatriptan '100mg'$ , Zomig '5mg'$  (effective), Frova, Relpax, DHE (effective), Maxalt (effective but unable to afford it), Tosymra NS, Zomig '5mg'$  NS Past antiemetic:  Promethazine, Zofran Past anxiolytics:  Vicodin Past muscle relaxants:  cyclobenzaprine Past antihypertensives:  propranolol (ineffective), atenolol (ineffective), lisinopril, metoprolol, HCTZ Past antidepressants:  amitriptyline (side effects), venlafaxine (ineffective), nortriptyline (effective but side effects such as rash), imipramine Past antiepileptics:  Depakote (hair loss), lamotrigine (side effects), zonisamide (side effects), gabapentin, Keppra Past CGRP inhibitor:  Aimovig (effective but concerned about side effects- ankle jerking,  abnormal skin pattern), Ajovy (same side effects); Emgality (cost) Past vitamins and supplements:  magnesium (ineffective), feverfew (  ineffective) Other past therapy:  Botox (one round, flu like symptoms, unable to afford 20% copay), occipital nerve blocks, trigger point injections, alternating heat/ice   Family history of headache:  Mother, grandmother, great-aunt, cousins.   08/16/08 MRI Brain w/wo performed for "explosive headaches": nonspecific punctate hyperintensities in the subcortical white matter. 08/16/08 MRA Head: Unremarkable.  There is mild attenuation in the vertebrobasilar junction and proximal basilar artery which is likely artifact. 07/05/19 CTA of Head:  Unremarkable for mass lesion, high-grade arterial stenosis, aneurysm or other acute intracranial abnormality.  Past Medical History: Past Medical History:  Diagnosis Date   Acute diastolic CHF (congestive heart failure) ([place]) 04/30/2013   2/13-08/65 acute diastolic heart failure  BNP 1528 2 D echocardiogram essentially negative  Chest x-ray revealed cardiomegaly with costophrenic angle blunting and suggestion of possible interstitial vascular accentuation; no frank heart failure present. 10 pound weight loss with 20 mg of furosemide daily  No definite etiology of the cardiomegaly or acute diastolic failure    Anxiety 02/07/2015   Auditory hallucination 10/06/2017   B12 deficiency 10/07/2018   Bradycardia 04/30/2013   Cancer ([place])    Chest pain 05/14/2020   Chest pain with moderate risk of acute coronary syndrome 02/07/2015   CHF (congestive heart failure) ([place])    dystolic heart dysfunction   Chronic kidney disease    De Quervain's tenosynovitis, left 05/23/2013   Depression    Depression with anxiety    Depressive disorder, not elsewhere classified 05/31/2007   Centricity Description: DISORDER, DEPRESSIVE NEC Qualifier: Diagnosis of  By: Linna Darner MD, [place] Octave, [place]    Diabetes mellitus  without complication [facility])    Diet controlled   Dysmetabolic syndrome X 7/84/6962   Qualifier: Diagnosis of  By: Linna Darner MD, William  A1c 6.3% in 2/12; TG 278    Essential hypertension 08/09/2008   Qualifier: Diagnosis of  By: Linna Darner MD, William     Estrogen deficiency 02/02/2019   Estrogen deficiency 02/02/2019   Fatigue 02/02/2019   Hair loss 02/02/2019   Hyperlipidemia 02/27/2008   Qualifier: Diagnosis of  By: Linna Darner MD, William     Hypertension    Intertrigo    INTERTRIGO 11/08/2009   Qualifier: Diagnosis of  By: Inda Castle FNP, Melissa S    Intractable migraine with aura without status migrainosus 08/03/2018   Migraine variant 08/15/2008   Qualifier: Diagnosis of  By: Linna Darner MD, William   Onset:initially @ age 18;recurrence age 4 during 1st trimester. Recurrence @ 39. No prodrome or aura Previously on Tryptans & Topamax with temporary relief but tolerance appeared Botox by Dr Leward Quan 09/2010  most effective     Migraines    Myalgia 02/02/2019   NONSPECIFIC ABNORMAL ELECTROCARDIOGRAM 08/15/2008   Qualifier: Diagnosis of  By: Linna Darner MD, William     Palpitation 04/17/2014   Panic attacks    Panic attacks    Polydipsia 11/02/2007   Qualifier: Diagnosis of  By: Linna Darner MD, Bear     RENAL CALCULUS 02/21/2007   Qualifier: Diagnosis of  By: Allen Norris     Squamous cell carcinoma in situ of skin 12/26/2015   Thrombocytopenia ([place]) 10/06/2017   Viral syndrome 04/10/2019   WEIGHT GAIN 11/02/2007   Qualifier: Diagnosis of  By: Linna Darner MD, Gwyndolyn Saxon      Medications: Outpatient Encounter Medications as of 10/06/2022  Medication Sig Note   ALPRAZolam (XANAX) 1 MG tablet TAKE 1 TABLET BY MOUTH EVERY DAY AT BEDTIME AS NEEDED FOR SLEEP  aspirin EC 81 MG tablet Take 1 tablet (81 mg total) by mouth daily. Swallow whole.    FLUoxetine (PROZAC) 20 MG capsule TAKE 1 CAPSULE EVERY DAY    furosemide (LASIX) 20 MG tablet TAKE 1 TABLET EVERY DAY    losartan (COZAAR) 50 MG tablet TAKE 1 TABLET EVERY DAY     metoCLOPramide (REGLAN) 10 MG tablet Take 1 tablet (10 mg total) by mouth every 6 (six) hours as needed for nausea.    topiramate (TOPAMAX) 100 MG tablet TAKE 1 TABLET TWICE DAILY    triamcinolone ointment (KENALOG) 0.1 % Apply topically as needed 04/24/2016: Received from: External Pharmacy Received Sig:    No facility-administered encounter medications on file as of 10/06/2022.    Allergies: Allergies  Allergen Reactions   Amoxicillin-Pot Clavulanate Diarrhea and Other (See Comments)   Antihistamines, Diphenhydramine-Type Other (See Comments)    Other Reaction: makes pt nervous   Diphenhydramine Hcl Other (See Comments)    Other Reaction: makes pt nervous   Fremanezumab-Vfrm Rash and Other (See Comments)    Seizure, jerking   Other Other (See Comments)   Tramadol Hcl Other (See Comments)   Clindamycin Other (See Comments)    She has not taken but per patient  "her mother had a reaction and she does not want it ever"   Hydrocodone-Acetaminophen Other (See Comments)    REACTION: sensitive to  but can take for extreme pain per patient   Lisinopril     Restless , irregular sleep   Nortriptyline Other (See Comments)    UNKNOWN REACTION   Propoxyphene N-Acetaminophen Nausea Only   Sulfa Antibiotics Other (See Comments)    Unknown reaction   Tramadol Other (See Comments)    UNKNOWN REACTION    Family History: Family History  Problem Relation Age of Onset   Crohn's disease Mother    Atrial fibrillation Mother    Heart failure Mother    Bladder Cancer Father    Cancer Father        bladder   Bipolar disorder Brother    Cancer Brother    Bipolar disorder Sister    Heart attack Maternal Grandmother        >65   Cancer Maternal Aunt    Colon cancer Maternal Uncle    Cancer Paternal Grandfather    Cancer Maternal Aunt    Cancer Cousin    Anemia Cousin    Diabetes Neg Hx    Stroke Neg Hx     Observations/Objective:   *** No acute distress.  Alert and oriented.   Speech fluent and not dysarthric.  Language intact.  Eyes orthophoric on primary gaze.  Face symmetric.   Follow Up Instructions:    -I discussed the assessment and treatment plan with the patient. The patient was provided an opportunity to ask questions and all were answered. The patient agreed with the plan and demonstrated an understanding of the instructions.   The patient was advised to call back or seek an in-person evaluation if the symptoms worsen or if the condition fails to improve as anticipated.   Metta Clines, DO  CC: Roma Schanz, DO

## 2022-10-06 ENCOUNTER — Telehealth (INDEPENDENT_AMBULATORY_CARE_PROVIDER_SITE_OTHER): Payer: Medicare HMO | Admitting: Neurology

## 2022-10-06 ENCOUNTER — Encounter: Payer: Self-pay | Admitting: Neurology

## 2022-10-06 VITALS — Ht 62.0 in | Wt 175.0 lb

## 2022-10-06 DIAGNOSIS — G43009 Migraine without aura, not intractable, without status migrainosus: Secondary | ICD-10-CM | POA: Diagnosis not present

## 2022-10-10 ENCOUNTER — Other Ambulatory Visit: Payer: Self-pay | Admitting: Family Medicine

## 2022-10-10 DIAGNOSIS — I10 Essential (primary) hypertension: Secondary | ICD-10-CM

## 2022-10-10 DIAGNOSIS — I5189 Other ill-defined heart diseases: Secondary | ICD-10-CM

## 2022-10-12 ENCOUNTER — Inpatient Hospital Stay: Payer: Medicare HMO | Attending: Family

## 2022-10-12 VITALS — BP 121/74 | HR 68 | Temp 98.0°F | Resp 17

## 2022-10-12 DIAGNOSIS — E538 Deficiency of other specified B group vitamins: Secondary | ICD-10-CM | POA: Diagnosis not present

## 2022-10-12 DIAGNOSIS — D6959 Other secondary thrombocytopenia: Secondary | ICD-10-CM | POA: Diagnosis not present

## 2022-10-12 MED ORDER — CYANOCOBALAMIN 1000 MCG/ML IJ SOLN
1000.0000 ug | Freq: Once | INTRAMUSCULAR | Status: AC
  Administered 2022-10-12: 1000 ug via INTRAMUSCULAR
  Filled 2022-10-12: qty 1

## 2022-10-15 ENCOUNTER — Other Ambulatory Visit: Payer: Self-pay | Admitting: Family Medicine

## 2022-10-15 DIAGNOSIS — F419 Anxiety disorder, unspecified: Secondary | ICD-10-CM

## 2022-10-15 NOTE — Telephone Encounter (Signed)
Requesting: Xanax Contract:05/05/22 UDS: 05/05/22 Last Visit: 05/05/2022 Next Visit:11/05/2022 Last Refill: 09/03/2022  Please Advise

## 2022-11-05 ENCOUNTER — Ambulatory Visit (INDEPENDENT_AMBULATORY_CARE_PROVIDER_SITE_OTHER): Payer: Medicare HMO | Admitting: Family Medicine

## 2022-11-05 VITALS — BP 124/84 | HR 75 | Temp 98.3°F | Resp 18 | Ht 62.0 in | Wt 177.8 lb

## 2022-11-05 DIAGNOSIS — R739 Hyperglycemia, unspecified: Secondary | ICD-10-CM | POA: Diagnosis not present

## 2022-11-05 DIAGNOSIS — I10 Essential (primary) hypertension: Secondary | ICD-10-CM

## 2022-11-05 DIAGNOSIS — F419 Anxiety disorder, unspecified: Secondary | ICD-10-CM | POA: Diagnosis not present

## 2022-11-05 DIAGNOSIS — E785 Hyperlipidemia, unspecified: Secondary | ICD-10-CM | POA: Diagnosis not present

## 2022-11-05 DIAGNOSIS — E559 Vitamin D deficiency, unspecified: Secondary | ICD-10-CM | POA: Diagnosis not present

## 2022-11-05 DIAGNOSIS — G43801 Other migraine, not intractable, with status migrainosus: Secondary | ICD-10-CM

## 2022-11-05 DIAGNOSIS — E538 Deficiency of other specified B group vitamins: Secondary | ICD-10-CM

## 2022-11-05 DIAGNOSIS — R5383 Other fatigue: Secondary | ICD-10-CM

## 2022-11-05 DIAGNOSIS — I509 Heart failure, unspecified: Secondary | ICD-10-CM | POA: Diagnosis not present

## 2022-11-05 DIAGNOSIS — I130 Hypertensive heart and chronic kidney disease with heart failure and stage 1 through stage 4 chronic kidney disease, or unspecified chronic kidney disease: Secondary | ICD-10-CM | POA: Diagnosis not present

## 2022-11-05 DIAGNOSIS — Z23 Encounter for immunization: Secondary | ICD-10-CM | POA: Diagnosis not present

## 2022-11-05 MED ORDER — AMLODIPINE BESYLATE 5 MG PO TABS: 5.0000 mg | ORAL_TABLET | Freq: Every day | ORAL | 1 refills | Status: DC

## 2022-11-05 NOTE — Patient Instructions (Signed)

## 2022-11-05 NOTE — Progress Notes (Signed)
Subjective:   By signing my name below, I, Ebony Garcia, attest that this documentation has been prepared under the direction and in the presence of Ebony Held, DO. 11/05/2022     Patient ID: Ebony Garcia, female    DOB: 06-03-1951, 71 y.o.   MRN: [id number]  Chief Complaint  Patient presents with   Hyperlipidemia   Anxiety   Follow-up    Hyperlipidemia Associated symptoms include myalgias. Pertinent negatives include no chest pain or shortness of breath.  Anxiety Patient reports no chest pain, dizziness, nausea, nervous/anxious behavior, palpitations or shortness of breath.     Patient is in today for a follow up visit.   She complains of feeling fatigued recently. She continues taking 100 mg Topamax daily PO and reports having no migraines for the past 8 months. Her neurologist is tapering her off of Topamax. She is receiving vitamin B12 infusion every 3 months and reports doing well while taking them.  She also complains of aching muscles. She went on a retreat recently and had a massage and found she had muscle aching since then.  Her anxiety is under control during this visit.  She is receiving her flu vaccine during this visit.  She is UTD on medication refills.  She continues taking 50 mg losartan daily PO and reports constant coughing while taking it. Her cough worsens at night. BP Readings from Last 3 Encounters:  11/05/22 124/84  10/12/22 121/74  09/22/22 106/71   Pulse Readings from Last 3 Encounters:  11/05/22 75  10/12/22 68  09/22/22 61    Past Medical History:  Diagnosis Date   Acute diastolic CHF (congestive heart failure) ([place]) 04/30/2013   3/55-73/22 acute diastolic heart failure  BNP 1528 2 D echocardiogram essentially negative  Chest x-ray revealed cardiomegaly with costophrenic angle blunting and suggestion of possible interstitial vascular accentuation; no frank heart failure present. 10 pound weight loss with 20 mg of furosemide  daily  No definite etiology of the cardiomegaly or acute diastolic failure    Anxiety 02/07/2015   Auditory hallucination 10/06/2017   B12 deficiency 10/07/2018   Bradycardia 04/30/2013   Cancer ([place])    Chest pain 05/14/2020   Chest pain with moderate risk of acute coronary syndrome 02/07/2015   CHF (congestive heart failure) ([place])    dystolic heart dysfunction   Chronic kidney disease    De Quervain's tenosynovitis, left 05/23/2013   Depression    Depression with anxiety    Depressive disorder, not elsewhere classified 05/31/2007   Centricity Description: DISORDER, DEPRESSIVE NEC Qualifier: Diagnosis of  By: Linna Darner MD, [place] Octave, [place]    Diabetes mellitus without complication [facility])    Diet controlled   Dysmetabolic syndrome X 0/25/4270   Qualifier: Diagnosis of  By: Linna Darner MD, William  A1c 6.3% in 2/12; TG 278    Essential hypertension 08/09/2008   Qualifier: Diagnosis of  By: Linna Darner MD, William     Estrogen deficiency 02/02/2019   Estrogen deficiency 02/02/2019   Fatigue 02/02/2019   Hair loss 02/02/2019   Hyperlipidemia 02/27/2008   Qualifier: Diagnosis of  By: Linna Darner MD, William     Hypertension    Intertrigo    INTERTRIGO 11/08/2009   Qualifier: Diagnosis of  By: Inda Castle FNP, Melissa S    Intractable migraine with aura without status migrainosus 08/03/2018   Migraine variant 08/15/2008   Qualifier: Diagnosis of  By: Linna Darner MD, Gwyndolyn Saxon   Onset:initially @ age 75;recurrence  age 81 during 1st trimester. Recurrence @ 39. No prodrome or aura Previously on Tryptans & Topamax with temporary relief but tolerance appeared Botox by Dr Leward Quan 09/2010  most effective     Migraines    Myalgia 02/02/2019   NONSPECIFIC ABNORMAL ELECTROCARDIOGRAM 08/15/2008   Qualifier: Diagnosis of  By: Linna Darner MD, William     Palpitation 04/17/2014   Panic attacks    Panic attacks    Polydipsia 11/02/2007   Qualifier: Diagnosis of  By: Linna Darner MD, William     RENAL  CALCULUS 02/21/2007   Qualifier: Diagnosis of  By: Allen Norris     Squamous cell carcinoma in situ of skin 12/26/2015   Thrombocytopenia ([place]) 10/06/2017   Viral syndrome 04/10/2019   WEIGHT GAIN 11/02/2007   Qualifier: Diagnosis of  By: Linna Darner MD, Gwyndolyn Saxon      Past Surgical History:  Procedure Laterality Date   ABDOMINAL HYSTERECTOMY     BREAST SURGERY     DILATION AND CURETTAGE OF UTERUS     NASAL SEPTUM SURGERY     papaloma Right    TONSILLECTOMY AND ADENOIDECTOMY     TOTAL ABDOMINAL HYSTERECTOMY W/ BILATERAL SALPINGOOPHORECTOMY     for fibroids and migraines   TUBAL LIGATION      Family History  Problem Relation Age of Onset   Crohn's disease Mother    Atrial fibrillation Mother    Heart failure Mother    Bladder Cancer Father    Cancer Father        bladder   Bipolar disorder Brother    Cancer Brother    Bipolar disorder Sister    Heart attack Maternal Grandmother        >65   Cancer Maternal Aunt    Colon cancer Maternal Uncle    Cancer Paternal Grandfather    Cancer Maternal Aunt    Cancer Cousin    Anemia Cousin    Diabetes Neg Hx    Stroke Neg Hx     Social History   Socioeconomic History   Marital status: Divorced    Spouse name: Not on file   Number of children: 3   Years of education: Not on file   Highest education level: Not on file  Occupational History    Employer: UNEMPLOYED  Tobacco Use   Smoking status: Never   Smokeless tobacco: Never  Vaping Use   Vaping Use: Never used  Substance and Sexual Activity   Alcohol use: No    Alcohol/week: 0.0 standard drinks of alcohol   Drug use: No   Sexual activity: Not Currently    Partners: Male  Other Topics Concern   Not on file  Social History Narrative   Lives alone.        Patient is right-handed. She lives on the first floor.      Drinks caffeine   Social Determinants of Health   Financial Resource Strain: Low Risk  (01/30/2022)   Overall Financial Resource Strain (CARDIA)     Difficulty of Paying Living Expenses: Not hard at all  Food Insecurity: No Food Insecurity (01/30/2022)   Hunger Vital Sign    Worried About Running Out of Food in the Last Year: Never true    Ran Out of Food in the Last Year: Never true  Transportation Needs: No Transportation Needs (01/30/2022)   PRAPARE - Hydrologist (Medical): No    Lack of Transportation (Non-Medical): No  Physical Activity: Inactive (01/30/2022)   Exercise  Vital Sign    Days of Exercise per Week: 0 days    Minutes of Exercise per Session: 0 min  Stress: No Stress Concern Present (01/30/2022)   [place]    Feeling of Stress : Not at all  Social Connections: Socially Isolated (01/30/2022)   Social Connection and Isolation Panel [NHANES]    Frequency of Communication with Friends and Family: More than three times a week    Frequency of Social Gatherings with Friends and Family: Twice a week    Attends Religious Services: Never    Marine scientist or Organizations: No    Attends Archivist Meetings: Never    Marital Status: Divorced  Human resources officer Violence: Not At Risk (01/30/2022)   Humiliation, Afraid, Rape, and Kick questionnaire    Fear of Current or Ex-Partner: No    Emotionally Abused: No    Physically Abused: No    Sexually Abused: No    Outpatient Medications Prior to Visit  Medication Sig Dispense Refill   ALPRAZolam (XANAX) 1 MG tablet TAKE 1 TABLET BY MOUTH EVERY DAY AT BEDTIME AS NEEDED FOR SLEEP 30 tablet 0   aspirin EC 81 MG tablet Take 1 tablet (81 mg total) by mouth daily. Swallow whole. 30 tablet 11   Eptinezumab-jjmr (VYEPTI) 100 MG/ML injection Infusion q12 weeks 1.12 mL 0   FLUoxetine (PROZAC) 20 MG capsule TAKE 1 CAPSULE EVERY DAY 90 capsule 1   furosemide (LASIX) 20 MG tablet TAKE 1 TABLET EVERY DAY 90 tablet 1   metoCLOPramide (REGLAN) 10 MG tablet Take 1 tablet (10 mg total) by  mouth every 6 (six) hours as needed for nausea. 20 tablet 5   topiramate (TOPAMAX) 100 MG tablet TAKE 1 TABLET TWICE DAILY 180 tablet 0   triamcinolone ointment (KENALOG) 0.1 % Apply topically as needed     losartan (COZAAR) 50 MG tablet TAKE 1 TABLET EVERY DAY 90 tablet 1   No facility-administered medications prior to visit.    Allergies  Allergen Reactions   Amoxicillin-Pot Clavulanate Diarrhea and Other (See Comments)   Antihistamines, Diphenhydramine-Type Other (See Comments)    Other Reaction: makes pt nervous   Diphenhydramine Hcl Other (See Comments)    Other Reaction: makes pt nervous   Fremanezumab-Vfrm Rash and Other (See Comments)    Seizure, jerking   Other Other (See Comments)   Tramadol Hcl Other (See Comments)   Clindamycin Other (See Comments)    She has not taken but per patient  "her mother had a reaction and she does not want it ever"   Hydrocodone-Acetaminophen Other (See Comments)    REACTION: sensitive to  but can take for extreme pain per patient   Lisinopril     Restless , irregular sleep   Nortriptyline Other (See Comments)    UNKNOWN REACTION   Propoxyphene N-Acetaminophen Nausea Only   Sulfa Antibiotics Other (See Comments)    Unknown reaction   Tramadol Other (See Comments)    UNKNOWN REACTION    Review of Systems  Constitutional:  Positive for malaise/fatigue. Negative for fever.  HENT:  Negative for congestion.   Eyes:  Negative for blurred vision.  Respiratory:  Negative for shortness of breath.   Cardiovascular:  Negative for chest pain, palpitations and leg swelling.  Gastrointestinal:  Negative for abdominal pain, blood in stool and nausea.  Genitourinary:  Negative for dysuria and frequency.  Musculoskeletal:  Positive for myalgias. Negative for falls.  Skin:  Negative for rash.  Neurological:  Negative for dizziness, loss of consciousness and headaches.  Endo/Heme/Allergies:  Negative for environmental allergies.   Psychiatric/Behavioral:  Negative for depression. The patient is not nervous/anxious.        Objective:    Physical Exam Vitals and nursing note reviewed.  Constitutional:      Appearance: She is well-developed.  HENT:     Head: Normocephalic and atraumatic.  Eyes:     Conjunctiva/sclera: Conjunctivae normal.  Neck:     Thyroid: No thyromegaly.     Vascular: No carotid bruit or JVD.  Cardiovascular:     Rate and Rhythm: Normal rate and regular rhythm.     Heart sounds: Normal heart sounds. No murmur heard. Pulmonary:     Effort: Pulmonary effort is normal. No respiratory distress.     Breath sounds: Normal breath sounds. No wheezing or rales.  Chest:     Chest wall: No tenderness.  Musculoskeletal:     Cervical back: Normal range of motion and neck supple.  Neurological:     Mental Status: She is alert and oriented to person, place, and time.  Psychiatric:        Mood and Affect: Mood normal.        Behavior: Behavior normal.        Thought Content: Thought content normal.        Judgment: Judgment normal.     BP 124/84 (BP Location: Left Arm, Patient Position: Sitting, Cuff Size: Normal)   Pulse 75   Temp 98.3 F (36.8 C) (Oral)   Resp 18   Ht '5\' 2"'$  (1.575 m)   Wt 177 lb 12.8 oz (80.6 kg)   SpO2 97%   BMI 32.52 kg/m  Wt Readings from Last 3 Encounters:  11/05/22 177 lb 12.8 oz (80.6 kg)  10/06/22 175 lb (79.4 kg)  09/22/22 175 lb (79.4 kg)    Diabetic Foot Exam - Simple   No data filed    Lab Results  Component Value Date   WBC 7.4 11/05/2022   HGB 13.4 11/05/2022   HCT 40.2 11/05/2022   PLT 78.0 (L) 11/05/2022   GLUCOSE 98 11/05/2022   CHOL 187 11/05/2022   TRIG 312.0 (H) 11/05/2022   HDL 50.50 11/05/2022   LDLDIRECT 99.0 11/05/2022   LDLCALC 100 (H) 05/05/2022   ALT 23 11/05/2022   AST 19 11/05/2022   NA 142 11/05/2022   K 3.5 11/05/2022   CL 109 11/05/2022   CREATININE 0.88 11/05/2022   BUN 13 11/05/2022   CO2 24 11/05/2022   TSH 2.74  11/05/2022   INR 0.93 07/19/2010   HGBA1C 5.8 11/05/2022   MICROALBUR <0.7 11/04/2021    Lab Results  Component Value Date   TSH 2.74 11/05/2022   Lab Results  Component Value Date   WBC 7.4 11/05/2022   HGB 13.4 11/05/2022   HCT 40.2 11/05/2022   MCV 92.4 11/05/2022   PLT 78.0 (L) 11/05/2022   Lab Results  Component Value Date   NA 142 11/05/2022   K 3.5 11/05/2022   CO2 24 11/05/2022   GLUCOSE 98 11/05/2022   BUN 13 11/05/2022   CREATININE 0.88 11/05/2022   BILITOT 0.2 11/05/2022   ALKPHOS 75 11/05/2022   AST 19 11/05/2022   ALT 23 11/05/2022   PROT 6.8 11/05/2022   ALBUMIN 4.4 11/05/2022   CALCIUM 9.2 11/05/2022   ANIONGAP 5 08/03/2022   GFR 67.20 11/05/2022   Lab Results  Component Value Date  CHOL 187 11/05/2022   Lab Results  Component Value Date   HDL 50.50 11/05/2022   Lab Results  Component Value Date   LDLCALC 100 (H) 05/05/2022   Lab Results  Component Value Date   TRIG 312.0 (H) 11/05/2022   Lab Results  Component Value Date   CHOLHDL 4 11/05/2022   Lab Results  Component Value Date   HGBA1C 5.8 11/05/2022       Assessment & Plan:   Problem List Items Addressed This Visit       Unprioritized   Migraines   Relevant Medications   amLODipine (NORVASC) 5 MG tablet   Eptinezumab-jjmr (VYEPTI) 100 MG/ML injection   Other Relevant Orders   Hemoglobin A1c (Completed)   Hypertension   Relevant Medications   amLODipine (NORVASC) 5 MG tablet   Other Relevant Orders   CBC with Differential/Platelet (Completed)   Comprehensive metabolic panel (Completed)   Lipid panel (Completed)   TSH (Completed)   Vitamin B12 (Completed)   VITAMIN D 25 Hydroxy (Vit-D Deficiency, Fractures) (Completed)   Hemoglobin A1c (Completed)   Hyperlipidemia   Relevant Medications   amLODipine (NORVASC) 5 MG tablet   Other Relevant Orders   CBC with Differential/Platelet (Completed)   Comprehensive metabolic panel (Completed)   Lipid panel (Completed)    TSH (Completed)   Vitamin B12 (Completed)   VITAMIN D 25 Hydroxy (Vit-D Deficiency, Fractures) (Completed)   Fatigue   Relevant Orders   TSH (Completed)   Vitamin B12 (Completed)   VITAMIN D 25 Hydroxy (Vit-D Deficiency, Fractures) (Completed)   Hemoglobin A1c (Completed)   Anxiety   Other Visit Diagnoses     Need for influenza vaccination    -  Primary   Relevant Orders   Flu Vaccine QUAD High Dose(Fluad) (Completed)   Vitamin B12 deficiency       Relevant Orders   Vitamin B12 (Completed)   Vitamin D deficiency       Relevant Orders   VITAMIN D 25 Hydroxy (Vit-D Deficiency, Fractures) (Completed)   Hyperglycemia       Relevant Orders   Hemoglobin A1c (Completed)        Meds ordered this encounter  Medications   amLODipine (NORVASC) 5 MG tablet    Sig: Take 1 tablet (5 mg total) by mouth daily.    Dispense:  90 tablet    Refill:  1    I, Ebony Held, DO, personally preformed the services described in this documentation.  All medical record entries made by the scribe were at my direction and in my presence.  I have reviewed the chart and discharge instructions (if applicable) and agree that the record reflects my personal performance and is accurate and complete. 11/05/2022   I,Ebony Garcia,acting as a scribe for Ebony Held, DO.,have documented all relevant documentation on the behalf of Ebony Held, DO,as directed by  Ebony Held, DO while in the presence of Ebony Held, DO.   Ebony Held, DO

## 2022-11-06 LAB — TSH: TSH: 2.74 u[IU]/mL (ref 0.35–5.50)

## 2022-11-06 LAB — LIPID PANEL
Cholesterol: 187 mg/dL (ref 0–200)
HDL: 50.5 mg/dL (ref >39.00)
NonHDL: 136.54
Total CHOL/HDL Ratio: 4
Triglycerides: 312 mg/dL — ABNORMAL HIGH (ref 0.0–149.0)
VLDL: 62.4 mg/dL — ABNORMAL HIGH (ref 0.0–40.0)

## 2022-11-06 LAB — CBC WITH DIFFERENTIAL/PLATELET
Basophils Absolute: 0 10*3/uL (ref 0.0–0.1)
Basophils Relative: 0.6 % (ref 0.0–3.0)
Eosinophils Absolute: 0.2 10*3/uL (ref 0.0–0.7)
Eosinophils Relative: 2.3 % (ref 0.0–5.0)
HCT: 40.2 % (ref 36.0–46.0)
Hemoglobin: 13.4 g/dL (ref 12.0–15.0)
Lymphocytes Relative: 19.8 % (ref 12.0–46.0)
Lymphs Abs: 1.5 10*3/uL (ref 0.7–4.0)
MCHC: 33.2 g/dL (ref 30.0–36.0)
MCV: 92.4 fl (ref 78.0–100.0)
Monocytes Absolute: 0.6 10*3/uL (ref 0.1–1.0)
Monocytes Relative: 8.2 % (ref 3.0–12.0)
Neutro Abs: 5.1 10*3/uL (ref 1.4–7.7)
Neutrophils Relative %: 69.1 % (ref 43.0–77.0)
Platelets: 78 10*3/uL — ABNORMAL LOW (ref 150.0–400.0)
RBC: 4.35 Mil/uL (ref 3.87–5.11)
RDW: 13.6 % (ref 11.5–15.5)
WBC: 7.4 10*3/uL (ref 4.0–10.5)

## 2022-11-06 LAB — COMPREHENSIVE METABOLIC PANEL
ALT: 23 U/L (ref 0–35)
AST: 19 U/L (ref 0–37)
Albumin: 4.4 g/dL (ref 3.5–5.2)
Alkaline Phosphatase: 75 U/L (ref 39–117)
BUN: 13 mg/dL (ref 6–23)
CO2: 24 mEq/L (ref 19–32)
Calcium: 9.2 mg/dL (ref 8.4–10.5)
Chloride: 109 mEq/L (ref 96–112)
Creatinine, Ser: 0.88 mg/dL (ref 0.40–1.20)
GFR: 67.2 mL/min (ref >60.00)
Glucose, Bld: 98 mg/dL (ref 70–99)
Potassium: 3.5 mEq/L (ref 3.5–5.1)
Sodium: 142 mEq/L (ref 135–145)
Total Bilirubin: 0.2 mg/dL (ref 0.2–1.2)
Total Protein: 6.8 g/dL (ref 6.0–8.3)

## 2022-11-06 LAB — HEMOGLOBIN A1C: Hgb A1c MFr Bld: 5.8 % (ref 4.6–6.5)

## 2022-11-06 LAB — VITAMIN B12: Vitamin B-12: 419 pg/mL (ref 211–911)

## 2022-11-06 LAB — VITAMIN D 25 HYDROXY (VIT D DEFICIENCY, FRACTURES): VITD: 36.76 ng/mL (ref 30.00–100.00)

## 2022-11-06 LAB — LDL CHOLESTEROL, DIRECT: Direct LDL: 99 mg/dL

## 2022-11-07 ENCOUNTER — Encounter: Payer: Self-pay | Admitting: Family Medicine

## 2022-11-08 ENCOUNTER — Encounter: Payer: Self-pay | Admitting: Family Medicine

## 2022-11-09 ENCOUNTER — Inpatient Hospital Stay: Payer: Medicare HMO | Attending: Family

## 2022-11-09 ENCOUNTER — Other Ambulatory Visit: Payer: Self-pay | Admitting: Family Medicine

## 2022-11-09 VITALS — BP 135/70 | HR 79 | Temp 97.9°F | Resp 18

## 2022-11-09 DIAGNOSIS — E782 Mixed hyperlipidemia: Secondary | ICD-10-CM

## 2022-11-09 DIAGNOSIS — D6959 Other secondary thrombocytopenia: Secondary | ICD-10-CM | POA: Insufficient documentation

## 2022-11-09 DIAGNOSIS — E538 Deficiency of other specified B group vitamins: Secondary | ICD-10-CM | POA: Diagnosis not present

## 2022-11-09 MED ORDER — CYANOCOBALAMIN 1000 MCG/ML IJ SOLN
1000.0000 ug | Freq: Once | INTRAMUSCULAR | Status: AC
  Administered 2022-11-09: 1000 ug via INTRAMUSCULAR
  Filled 2022-11-09: qty 1

## 2022-11-09 NOTE — Patient Instructions (Signed)

## 2022-11-12 ENCOUNTER — Other Ambulatory Visit: Payer: Self-pay | Admitting: Neurology

## 2022-11-22 ENCOUNTER — Other Ambulatory Visit: Payer: Self-pay | Admitting: Family Medicine

## 2022-11-22 DIAGNOSIS — F419 Anxiety disorder, unspecified: Secondary | ICD-10-CM

## 2022-11-23 NOTE — Telephone Encounter (Signed)
Requesting: alprazolam '1mg'$   Contract: 05/05/22 UDS: 05/05/22 Last Visit: 11/05/22 Next Visit: None Last Refill: 10/15/22 #30 and 0RF   Please Advise

## 2022-11-25 ENCOUNTER — Telehealth: Payer: Self-pay | Admitting: Pharmacy Technician

## 2022-11-25 NOTE — Telephone Encounter (Signed)
PA re-newal: Toney Sang Submission: APPROVED Payer: [employer] Medication & CPT/J Code(s) submitted: Vyepti (Eptinezumab) [id number] Route of submission (phone, fax, portal):  Phone # [phone] Fax # Auth type: Buy/Bill Units/visits requested: 4 DOSES Reference number: [id number] Approval from: 02/27/22 to 12//31/24

## 2022-12-06 ENCOUNTER — Encounter: Payer: Self-pay | Admitting: Neurology

## 2022-12-09 ENCOUNTER — Telehealth: Payer: Self-pay

## 2022-12-09 NOTE — Telephone Encounter (Signed)
Per patient she declines to increase her Vyepti to 300 mg. She had some side effects on the 100 mg dose. Patient c/o Chest pains,cough,muscle Aches after infusion.  Patient states she doing better today, better then she felt over Christmas.

## 2022-12-16 ENCOUNTER — Ambulatory Visit (INDEPENDENT_AMBULATORY_CARE_PROVIDER_SITE_OTHER): Payer: Medicare HMO

## 2022-12-16 VITALS — BP 102/67 | HR 56 | Temp 97.8°F | Resp 16 | Ht 62.0 in | Wt 176.0 lb

## 2022-12-16 DIAGNOSIS — G43119 Migraine with aura, intractable, without status migrainosus: Secondary | ICD-10-CM

## 2022-12-16 MED ORDER — SODIUM CHLORIDE 0.9 % IV SOLN
100.0000 mg | Freq: Once | INTRAVENOUS | Status: AC
  Administered 2022-12-16: 100 mg via INTRAVENOUS
  Filled 2022-12-16: qty 1

## 2022-12-16 NOTE — Progress Notes (Signed)
Diagnosis:  Intractable migraine with aura without status migrainosus  Provider:  Marshell Garfinkel MD  Procedure: Infusion  IV Type: Peripheral, IV Location: R Antecubital  Vyepti (Eptinezumab-jjmr), Dose: 100 mg  Infusion Start Time: 1901  Infusion Stop Time: 1512  Post Infusion IV Care: Peripheral IV Discontinued  Discharge: Condition: Good, Destination: Home . AVS provided to patient.   Performed by:  Adelina Mings, LPN

## 2022-12-21 ENCOUNTER — Inpatient Hospital Stay: Payer: Medicare HMO | Attending: Family

## 2022-12-21 VITALS — BP 123/71 | HR 68 | Temp 97.9°F | Resp 18

## 2022-12-21 DIAGNOSIS — D6959 Other secondary thrombocytopenia: Secondary | ICD-10-CM | POA: Insufficient documentation

## 2022-12-21 DIAGNOSIS — E538 Deficiency of other specified B group vitamins: Secondary | ICD-10-CM | POA: Insufficient documentation

## 2022-12-21 MED ORDER — CYANOCOBALAMIN 1000 MCG/ML IJ SOLN
1000.0000 ug | Freq: Once | INTRAMUSCULAR | Status: AC
  Administered 2022-12-21: 1000 ug via INTRAMUSCULAR
  Filled 2022-12-21: qty 1

## 2022-12-21 NOTE — Patient Instructions (Signed)

## 2023-01-03 ENCOUNTER — Other Ambulatory Visit: Payer: Self-pay | Admitting: Family Medicine

## 2023-01-03 DIAGNOSIS — F419 Anxiety disorder, unspecified: Secondary | ICD-10-CM

## 2023-01-04 NOTE — Telephone Encounter (Signed)
Requesting: Xanax Contract: 05/05/2022 UDS: 05/05/2022 Last OV: 11/05/2022 Next OV: 02/04/23----LAB Last Refill: 11/23/2022, #30--0 RF Database:   Please advise

## 2023-01-22 ENCOUNTER — Telehealth: Payer: Self-pay | Admitting: Family Medicine

## 2023-01-22 NOTE — Telephone Encounter (Signed)
Pt called to advise that her gums are bleeding excessively when she is brushing her teeth and her platelets are low. She was told the last time she would need a steroid to help raise her platelets before she can go see her dentist. She uses Paediatric nurse on [address]. Please advise.

## 2023-01-25 ENCOUNTER — Other Ambulatory Visit: Payer: Self-pay | Admitting: Family Medicine

## 2023-01-25 DIAGNOSIS — Z98818 Other dental procedure status: Secondary | ICD-10-CM

## 2023-01-25 MED ORDER — AZITHROMYCIN 250 MG PO TABS: ORAL_TABLET | ORAL | 0 refills | Status: DC

## 2023-01-29 ENCOUNTER — Ambulatory Visit (INDEPENDENT_AMBULATORY_CARE_PROVIDER_SITE_OTHER): Payer: Medicare HMO | Admitting: *Deleted

## 2023-01-29 DIAGNOSIS — Z0000 Encounter for general adult medical examination without abnormal findings: Secondary | ICD-10-CM | POA: Diagnosis not present

## 2023-01-29 NOTE — Progress Notes (Signed)
Subjective:  Pt completed ADLs, Fall risk, & SDOH during e-check on 01/28/23. Answers verified with pt.    Ebony Garcia is a 72 y.o. female who presents for Medicare Annual (Subsequent) preventive examination.  I connected with  Ebony Garcia on 01/29/23 by a audio enabled telemedicine application and verified that I am speaking with the correct person using two identifiers.  Patient Location: Home  Provider Location: Office/Clinic  I discussed the limitations of evaluation and management by telemedicine. The patient expressed understanding and agreed to proceed.   Review of Systems    Defer to PCP Cardiac Risk Factors include: advanced age (>50mn, >>25women);dyslipidemia;hypertension     Objective:    There were no vitals filed for this visit. There is no height or weight on file to calculate BMI.     01/29/2023    2:22 PM 10/06/2022    3:00 PM 08/03/2022    2:43 PM 02/02/2022    1:18 PM 01/30/2022    3:07 PM 04/23/2021    1:58 PM 02/17/2021    2:23 PM  Advanced Directives  Does Patient Have a Medical Advance Directive? Yes Yes Yes Yes Yes Yes Yes;No  Type of Advance Directive Living will Living will [place] will Living will Living will Living will   Does patient want to make changes to medical advance directive? No - Patient declined     No - Patient declined   Copy of [place] Chart?   Yes - validated most recent copy scanned in chart (See row information)        Current Medications (verified) Outpatient Encounter Medications as of 01/29/2023  Medication Sig   ALPRAZolam (XANAX) 1 MG tablet TAKE 1 TABLET BY MOUTH EVERY DAY AT BEDTIME AS NEEDED FOR SLEEP   amLODipine (NORVASC) 5 MG tablet Take 1 tablet (5 mg total) by mouth daily.   aspirin EC 81 MG tablet Take 1 tablet (81 mg total) by mouth daily. Swallow whole.   azithromycin (ZITHROMAX Z-PAK) 250 MG tablet As directed   Eptinezumab-jjmr (VYEPTI) 100 MG/ML  injection Infusion q12 weeks   FLUoxetine (PROZAC) 20 MG capsule TAKE 1 CAPSULE EVERY DAY   furosemide (LASIX) 20 MG tablet TAKE 1 TABLET EVERY DAY   metoCLOPramide (REGLAN) 10 MG tablet Take 1 tablet (10 mg total) by mouth every 6 (six) hours as needed for nausea.   triamcinolone ointment (KENALOG) 0.1 % Apply topically as needed   [DISCONTINUED] topiramate (TOPAMAX) 100 MG tablet TAKE 1 TABLET TWICE DAILY   No facility-administered encounter medications on file as of 01/29/2023.    Allergies (verified) Amoxicillin-pot clavulanate; Antihistamines, diphenhydramine-type; Diphenhydramine hcl; Fremanezumab-vfrm; Other; Tramadol hcl; Clindamycin; Hydrocodone-acetaminophen; Lisinopril; Nortriptyline; Propoxyphene n-acetaminophen; Sulfa antibiotics; and Tramadol   History: Past Medical History:  Diagnosis Date   Acute diastolic CH[place] (congestive heart failure) [place] 04/30/2013   599991111acute diastolic heart failure  BNP 1528 2 D echocardiogram essentially negative  Chest x-ray revealed cardiomegaly with costophrenic angle blunting and suggestion of possible interstitial vascular accentuation; no frank heart failure present. 10 pound weight loss with 20 mg of furosemide daily  No definite etiology of the cardiomegaly or acute diastolic failure    Anxiety 02/07/2015   Auditory hallucination 10/06/2017   B12 deficiency 10/07/2018   Bradycardia 04/30/2013   Cancer ([facility]    Chest pain 05/14/2020   Chest pain with moderate risk of acute coronary syndrome 02/07/2015   [place] ([place]estive heart [place]) [place]  dystolic heart dysfunction   Chronic kidney disease    De Quervain's tenosynovitis, left 05/23/2013   Depression    Depression with anxiety    Depressive disorder, not elsewhere classified 05/31/2007   Centricity Description: DISORDER, DEPRESSIVE NEC Qualifier: Diagnosis of  By: Linna Darner MD, [place]ave, [place]    Diabetes mellitus without complication [facility])     Diet controlled   Dysmetabolic syndrome X 99991111   Qualifier: Diagnosis of  By: Linna Darner MD, [place]  A1c 6.3% in 2/12; TG 278    Essential hypertension 08/09/2008   Qualifier: Diagnosis of  By: Linna Darner MD, William     Estrogen deficiency 02/02/2019   Estrogen deficiency 02/02/2019   Fatigue 02/02/2019   Hair loss 02/02/2019   Hyperlipidemia 02/27/2008   Qualifier: Diagnosis of  By: Linna Darner MD, William     Hypertension    Intertrigo    INTERTRIGO 11/08/2009   Qualifier: Diagnosis of  By: Inda Castle FNP, Melissa S    Intractable migraine with aura without status migrainosus 08/03/2018   Migraine variant 08/15/2008   Qualifier: Diagnosis of  By: Linna Darner MD, William   Onset:initially @ age 77;recurrence age 80 during 1st trimester. Recurrence @ 39. No prodrome or aura Previously on Tryptans & Topamax with temporary relief but tolerance appeared Botox by Dr Leward Quan 09/2010  most effective     Migraines    Myalgia 02/02/2019   NONSPECIFIC ABNORMAL ELECTROCARDIOGRAM 08/15/2008   Qualifier: Diagnosis of  By: Linna Darner MD, William     Palpitation 04/17/2014   Panic attacks    Panic attacks    Polydipsia 11/02/2007   Qualifier: Diagnosis of  By: Linna Darner MD, William     RENAL CALCULUS 02/21/2007   Qualifier: Diagnosis of  By: Allen Norris     Squamous cell carcinoma in situ of skin 12/26/2015   Thrombocytopenia ([place]) 10/06/2017   Viral syndrome 04/10/2019   WEIGHT GAIN 11/02/2007   Qualifier: Diagnosis of  By: Linna Darner MD, Gwyndolyn Saxon     Past Surgical History:  Procedure Laterality Date   ABDOMINAL HYSTERECTOMY     BREAST SURGERY     DILATION AND CURETTAGE OF UTERUS     NASAL SEPTUM SURGERY     papaloma Right    TONSILLECTOMY AND ADENOIDECTOMY     TOTAL ABDOMINAL HYSTERECTOMY W/ BILATERAL SALPINGOOPHORECTOMY     for fibroids and migraines   TUBAL LIGATION     Family History  Problem Relation Age of Onset   Crohn's disease Mother    Atrial fibrillation Mother    Heart failure Mother    Bladder  Cancer Father    Cancer Father        bladder   Bipolar disorder Brother    Cancer Brother    Bipolar disorder Sister    Heart attack Maternal Grandmother        >65   Cancer Maternal Aunt    Colon cancer Maternal Uncle    Cancer Paternal Grandfather    Cancer Maternal Aunt    Cancer Cousin    Anemia Cousin    Diabetes Neg Hx    Stroke Neg Hx    Social History   Socioeconomic History   Marital status: Divorced    Spouse name: Not on file   Number of children: 3   Years of education: Not on file   Highest education level: Not on file  Occupational History    Employer: UNEMPLOYED  Tobacco Use   Smoking status: Never  Smokeless tobacco: Never  Vaping Use   Vaping Use: Never used  Substance and Sexual Activity   Alcohol use: No    Alcohol/week: 0.0 standard drinks of alcohol   Drug use: No   Sexual activity: Not Currently    Partners: Male  Other Topics Concern   Not on file  Social History Narrative   Lives alone.        Patient is right-handed. She lives on the first floor.      Drinks caffeine   Social Determinants of Health   Financial Resource Strain: High Risk (01/28/2023)   Overall Financial Resource Strain (CARDIA)    Difficulty of Paying Living Expenses: Hard  Food Insecurity: Food Insecurity Present (01/28/2023)   Hunger Vital Sign    Worried About Running Out of Food in the Last Year: Sometimes true    Ran Out of Food in the Last Year: Sometimes true  Transportation Needs: No Transportation Needs (01/28/2023)   PRAPARE - Hydrologist (Medical): No    Lack of Transportation (Non-Medical): No  Physical Activity: Inactive (01/28/2023)   Exercise Vital Sign    Days of Exercise per Week: 0 days    Minutes of Exercise per Session: 0 min  Stress: No Stress Concern Present (01/28/2023)   [place]    Feeling of Stress : Only a little  Social Connections:  Socially Isolated (01/28/2023)   Social Connection and Isolation Panel [NHANES]    Frequency of Communication with Friends and Family: More than three times a week    Frequency of Social Gatherings with Friends and Family: Twice a week    Attends Religious Services: Never    Marine scientist or Organizations: No    Attends Music therapist: Never    Marital Status: Divorced    Tobacco Counseling Counseling given: Not Answered   Clinical Intake:  Pre-visit preparation completed: Yes  Pain : No/denies pain  Diabetes: No  How often do you need to have someone help you when you read instructions, pamphlets, or other written materials from your doctor or pharmacy?: 1 - Never   Activities of Daily Living    01/28/2023    3:55 PM 01/30/2022    3:13 PM  In your present state of health, do you have any difficulty performing the following activities:  Hearing? 0 0  Vision? 0 0  Difficulty concentrating or making decisions? 0 0  Walking or climbing stairs? 1 1  Comment knee and hip pain stairs due to knee pain  Dressing or bathing? 0 0  Doing errands, shopping? 0 0  Preparing Food and eating ? N N  Using the Toilet? N N  In the past six months, have you accidently leaked urine? N N  Do you have problems with loss of bowel control? Y N  Managing your Medications? N N  Managing your Finances? N N  Housekeeping or managing your Housekeeping? N N    Patient Care Team: Carollee Herter, Alferd Apa, DO as PCP - General (Family Medicine) Janann August, MD as Referring Physician (Dermatology) Aloha Gell, MD as Consulting Physician (Obstetrics and Gynecology) Pieter Partridge, DO as Consulting Physician (Neurology) [place], Jennefer Bravo, MD as Consulting Physician (Dermatology) Minus Breeding, MD as Consulting Physician (Cardiology) Karie Georges, OD as Consulting Physician (Optometry) Bunnie Domino, LPN (Inactive) as Licensed Practical Nurse  Indicate any recent  Medical Services you may have received from  other than [facility] providers in the past year (date may be approximate).     Assessment:   This is a routine wellness examination for [place].  Hearing/Vision screen No results found.  Dietary issues and exercise activities discussed: Current Exercise Habits: The patient does not participate in regular exercise at present, Exercise limited by: orthopedic condition(s)   Goals Addressed   None    Depression Screen    01/29/2023    2:27 PM 11/05/2022    3:11 PM 01/30/2022    3:11 PM 11/04/2021   10:29 AM 02/25/2020   11:56 AM 05/17/2018    9:31 AM 09/20/2017   10:45 AM  PHQ 2/9 Scores  PHQ - 2 Score 0 0 1 0 0 2 2  PHQ- 9 Score     0 8 8    Fall Risk    01/28/2023    3:55 PM 11/05/2022    3:10 PM 10/06/2022    2:59 PM 01/30/2022    3:09 PM 02/17/2021    2:22 PM  [place] in the past year? 1 0 0 0 0  Comment rubber sole of shoe got caught on wood floor      Number falls in past yr: 1 0 0 0 0  Injury with Fall? 0 0 0 0 0  Risk for fall due to : History of fall(s)      Follow up Falls evaluation completed Falls evaluation completed Falls evaluation completed Falls prevention discussed     FALL RISK PREVENTION PERTAINING TO THE HOME:  Any stairs in or around the home? No  Home free of loose throw rugs in walkways, pet beds, electrical cords, etc? Yes  Adequate lighting in your home to reduce risk of falls? Yes   ASSISTIVE DEVICES UTILIZED TO PREVENT FALLS:  Life alert? No  Use of a cane, walker or w/c? No  Grab bars in the bathroom? No  Shower chair or bench in shower? No  Elevated toilet seat or a handicapped toilet? No   TIMED UP AND GO:  Was the test performed?  No, audio visit .   Cognitive Function:    05/14/2017    9:55 AM  MMSE - Mini Mental State Exam  Orientation to time 5  Orientation to Place 5  Registration 3  Attention/ Calculation 5  Recall 3  Language- name 2 objects 2  Language- repeat 1   Language- follow 3 step command 3  Language- read & follow direction 1  Write a sentence 1  Copy design 1  Total score 30        01/29/2023    2:47 PM  6CIT Screen  What Year? 0 points  What month? 0 points  What time? 0 points  Count back from 20 0 points  Months in reverse 0 points  Repeat phrase 0 points  Total Score 0 points    Immunizations Immunization History  Administered Date(s) Administered   Fluad Quad(high Dose 65+) 08/03/2019, 11/04/2021, 11/05/2022   Hepatitis B, ADULT 07/30/2016, 09/29/2016   Influenza Split 09/19/2012   Influenza Whole 11/08/2009   Influenza,inj,Quad PF,6+ Mos 11/07/2013, 07/30/2016, 09/20/2017, 09/14/2018   Influenza-Unspecified 09/11/2015   Moderna Sars-Covid-2 Vaccination 02/16/2020, 03/18/2020, 10/04/2020   PPD Test 07/30/2016, 11/03/2017   Pneumococcal Conjugate-13 08/03/2019   Td 11/08/2009   Zoster Recombinat (Shingrix) 01/17/2022    TDAP status: Up to date  Flu Vaccine status: Up to date  Pneumococcal vaccine status: Up to date  Covid-19 vaccine status:  Information provided on how to obtain vaccines.   Qualifies for Shingles Vaccine? Yes   Zostavax completed No   Shingrix Completed?: No.    Education has been provided regarding the importance of this vaccine. Patient has been advised to call insurance company to determine out of pocket expense if they have not yet received this vaccine. Advised may also receive vaccine at local pharmacy or Health Dept. Verbalized acceptance and understanding.  Screening Tests Health Maintenance  Topic Date Due   FOOT EXAM  Never done   OPHTHALMOLOGY EXAM  Never done   COLONOSCOPY (Pts 45-50yr Insurance coverage will need to be confirmed)  12/12/2014   MAMMOGRAM  09/10/2017   DEXA SCAN  Never done   DTaP/Tdap/Td (2 - Tdap) 11/09/2019   Pneumonia Vaccine 70 Years old (2 of 2 - PPSV23 or PCV20) 08/02/2020   Zoster Vaccines- Shingrix (2 of 2) 03/14/2022   COVID-19 Vaccine (4 - 2023-24  season) 08/07/2022   Diabetic kidney evaluation - Urine ACR  11/04/2022   Medicare Annual Wellness (AWV)  01/30/2023   HEMOGLOBIN A1C  05/06/2023   Diabetic kidney evaluation - eGFR measurement  11/06/2023   INFLUENZA VACCINE  Completed   Hepatitis C Screening  Completed   HPV VACCINES  Aged Out    Health Maintenance  Health Maintenance Due  Topic Date Due   FOOT EXAM  Never done   OPHTHALMOLOGY EXAM  Never done   COLONOSCOPY (Pts 45-431yrInsurance coverage will need to be confirmed)  12/12/2014   MAMMOGRAM  09/10/2017   DEXA SCAN  Never done   DTaP/Tdap/Td (2 - Tdap) 11/09/2019   Pneumonia Vaccine 6528Years old (2 of 2 - PPSV23 or PCV20) 08/02/2020   Zoster Vaccines- Shingrix (2 of 2) 03/14/2022   COVID-19 Vaccine (4 - 2023-24 season) 08/07/2022   Diabetic kidney evaluation - Urine ACR  11/04/2022   Medicare Annual Wellness (AWV)  01/30/2023    Colorectal cancer screen: pt declined at this time  Mammogram status: pt declined at this time  Bone Density status: pt declined at this time  Lung Cancer Screening: (Low Dose CT Chest recommended if Age 70-80ears, 30 pack-year currently smoking OR have quit w/in 15years.) does not qualify.    Additional Screening:  Hepatitis C Screening: does qualify; Completed 05/14/17  Vision Screening: Recommended annual ophthalmology exams for early detection of glaucoma and other disorders of the eye. Is the patient up to date with their annual eye exam?  No  Who is the provider or what is the name of the office in which the patient attends annual eye exams? Dr. HuLaban Emperorf pt is not established with a provider, would they like to be referred to a provider to establish care? No .   Dental Screening: Recommended annual dental exams for proper oral hygiene  Community Resource Referral / Chronic Care Management: CRR required this visit?  No   CCM required this visit?  No      Plan:     I have personally reviewed and noted the  following in the patient's chart:   Medical and social history Use of alcohol, tobacco or illicit drugs  Current medications and supplements including opioid prescriptions. Patient is not currently taking opioid prescriptions. Functional ability and status Nutritional status Physical activity Advanced directives List of other physicians Hospitalizations, surgeries, and ER visits in previous 12 months Vitals Screenings to include cognitive, depression, and falls Referrals and appointments  In addition, I have reviewed and discussed with patient  certain preventive protocols, quality metrics, and best practice recommendations. A written personalized care plan for preventive services as well as general preventive health recommendations were provided to patient.   Due to this being a telephonic visit, the after visit summary with patients personalized plan was offered to patient via mail or my-chart. Patient would like to access on my-chart.  Beatris Ship, [place]   01/29/2023   Nurse Notes: None

## 2023-01-29 NOTE — Patient Instructions (Signed)
Ebony Garcia , Thank you for taking time to come for your Medicare Wellness Visit. I appreciate your ongoing commitment to your health goals.    This is a list of the screening recommended for you and due dates:  Health Maintenance  Topic Date Due   Complete foot exam   Never done   Eye exam for diabetics  Never done   Colon Cancer Screening  12/12/2014   Mammogram  09/10/2017   DEXA scan (bone density measurement)  Never done   DTaP/Tdap/Td vaccine (2 - Tdap) 11/09/2019   Pneumonia Vaccine (2 of 2 - PPSV23 or PCV20) 08/02/2020   Zoster (Shingles) Vaccine (2 of 2) 03/14/2022   COVID-19 Vaccine (4 - 2023-24 season) 08/07/2022   Yearly kidney health urinalysis for diabetes  11/04/2022   Hemoglobin A1C  05/06/2023   Yearly kidney function blood test for diabetes  11/06/2023   Medicare Annual Wellness Visit  01/30/2024   Flu Shot  Completed   Hepatitis C Screening: USPSTF Recommendation to screen - Ages 18-79 yo.  Completed   HPV Vaccine  Aged Out     Next appointment: Follow up in one year for your annual wellness visit.  Preventive Care 70 Years and Older, Female Preventive care refers to lifestyle choices and visits with your health care provider that can promote health and wellness. What does preventive care include? A yearly physical exam. This is also called an annual well check. Dental exams once or twice a year. Routine eye exams. Ask your health care provider how often you should have your eyes checked. Personal lifestyle choices, including: Daily care of your teeth and gums. Regular physical activity. Eating a healthy diet. Avoiding tobacco and drug use. Limiting alcohol use. Practicing safe sex. Taking low-dose aspirin every day. Taking vitamin and mineral supplements as recommended by your health care provider. What happens during an annual well check? The services and screenings done by your health care provider during your annual well check will depend on your  age, overall health, lifestyle risk factors, and family history of disease. Counseling  Your health care provider may ask you questions about your: Alcohol use. Tobacco use. Drug use. Emotional well-being. Home and relationship well-being. Sexual activity. Eating habits. History of falls. Memory and ability to understand (cognition). Work and work Statistician. Reproductive health. Screening  You may have the following tests or measurements: Height, weight, and BMI. Blood pressure. Lipid and cholesterol levels. These may be checked every 5 years, or more frequently if you are over 62 years old. Skin check. Lung cancer screening. You may have this screening every year starting at age 73 if you have a 30-pack-year history of smoking and currently smoke or have quit within the past 15 years. Fecal occult blood test (FOBT) of the stool. You may have this test every year starting at age 40. Flexible sigmoidoscopy or colonoscopy. You may have a sigmoidoscopy every 5 years or a colonoscopy every 10 years starting at age 51. Hepatitis C blood test. Hepatitis B blood test. Sexually transmitted disease (STD) testing. Diabetes screening. This is done by checking your blood sugar (glucose) after you have not eaten for a while (fasting). You may have this done every 1-3 years. Bone density scan. This is done to screen for osteoporosis. You may have this done starting at age 69. Mammogram. This may be done every 1-2 years. Talk to your health care provider about how often you should have regular mammograms. Talk with your health care provider about  your test results, treatment options, and if necessary, the need for more tests. Vaccines  Your health care provider may recommend certain vaccines, such as: Influenza vaccine. This is recommended every year. Tetanus, diphtheria, and acellular pertussis (Tdap, Td) vaccine. You may need a Td booster every 10 years. Zoster vaccine. You may need this after  age 20. Pneumococcal 13-valent conjugate (PCV13) vaccine. One dose is recommended after age 61. Pneumococcal polysaccharide (PPSV23) vaccine. One dose is recommended after age 74. Talk to your health care provider about which screenings and vaccines you need and how often you need them. This information is not intended to replace advice given to you by your health care provider. Make sure you discuss any questions you have with your health care provider. Document Released: 12/20/2015 Document Revised: 08/12/2016 Document Reviewed: 09/24/2015 Elsevier Interactive Patient Education  2017 [place] Prevention in the Home Falls can cause injuries. They can happen to people of all ages. There are many things you can do to make your home safe and to help prevent falls. What can I do on the outside of my home? Regularly fix the edges of walkways and driveways and fix any cracks. Remove anything that might make you trip as you walk through a door, such as a raised step or threshold. Trim any bushes or trees on the path to your home. Use bright outdoor lighting. Clear any walking paths of anything that might make someone trip, such as rocks or tools. Regularly check to see if handrails are loose or broken. Make sure that both sides of any steps have handrails. Any raised decks and porches should have guardrails on the edges. Have any leaves, snow, or ice cleared regularly. Use sand or salt on walking paths during winter. Clean up any spills in your garage right away. This includes oil or grease spills. What can I do in the bathroom? Use night lights. Install grab bars by the toilet and in the tub and shower. Do not use towel bars as grab bars. Use non-skid mats or decals in the tub or shower. If you need to sit down in the shower, use a plastic, non-slip stool. Keep the floor dry. Clean up any water that spills on the floor as soon as it happens. Remove soap buildup in the tub or shower  regularly. Attach bath mats securely with double-sided non-slip rug tape. Do not have throw rugs and other things on the floor that can make you trip. What can I do in the bedroom? Use night lights. Make sure that you have a light by your bed that is easy to reach. Do not use any sheets or blankets that are too big for your bed. They should not hang down onto the floor. Have a firm chair that has side arms. You can use this for support while you get dressed. Do not have throw rugs and other things on the floor that can make you trip. What can I do in the kitchen? Clean up any spills right away. Avoid walking on wet floors. Keep items that you use a lot in easy-to-reach places. If you need to reach something above you, use a strong step stool that has a grab bar. Keep electrical cords out of the way. Do not use floor polish or wax that makes floors slippery. If you must use wax, use non-skid floor wax. Do not have throw rugs and other things on the floor that can make you trip. What can I do with  my stairs? Do not leave any items on the stairs. Make sure that there are handrails on both sides of the stairs and use them. Fix handrails that are broken or loose. Make sure that handrails are as long as the stairways. Check any carpeting to make sure that it is firmly attached to the stairs. Fix any carpet that is loose or worn. Avoid having throw rugs at the top or bottom of the stairs. If you do have throw rugs, attach them to the floor with carpet tape. Make sure that you have a light switch at the top of the stairs and the bottom of the stairs. If you do not have them, ask someone to add them for you. What else can I do to help prevent falls? Wear shoes that: Do not have high heels. Have rubber bottoms. Are comfortable and fit you well. Are closed at the toe. Do not wear sandals. If you use a stepladder: Make sure that it is fully opened. Do not climb a closed stepladder. Make sure that  both sides of the stepladder are locked into place. Ask someone to hold it for you, if possible. Clearly mark and make sure that you can see: Any grab bars or handrails. First and last steps. Where the edge of each step is. Use tools that help you move around (mobility aids) if they are needed. These include: Canes. Walkers. Scooters. Crutches. Turn on the lights when you go into a dark area. Replace any light bulbs as soon as they burn out. Set up your furniture so you have a clear path. Avoid moving your furniture around. If any of your floors are uneven, fix them. If there are any pets around you, be aware of where they are. Review your medicines with your doctor. Some medicines can make you feel dizzy. This can increase your chance of falling. Ask your doctor what other things that you can do to help prevent falls. This information is not intended to replace advice given to you by your health care provider. Make sure you discuss any questions you have with your health care provider. Document Released: 09/19/2009 Document Revised: 04/30/2016 Document Reviewed: 12/28/2014 Elsevier Interactive Patient Education  2017 [employer].

## 2023-02-01 ENCOUNTER — Inpatient Hospital Stay: Payer: Medicare HMO | Admitting: Family

## 2023-02-01 ENCOUNTER — Inpatient Hospital Stay: Payer: Medicare HMO

## 2023-02-01 ENCOUNTER — Inpatient Hospital Stay: Payer: Medicare HMO | Attending: Family

## 2023-02-01 ENCOUNTER — Encounter: Payer: Self-pay | Admitting: Family

## 2023-02-01 VITALS — BP 120/78 | HR 69 | Temp 97.9°F | Resp 18 | Wt 173.0 lb

## 2023-02-01 DIAGNOSIS — E538 Deficiency of other specified B group vitamins: Secondary | ICD-10-CM

## 2023-02-01 DIAGNOSIS — D6959 Other secondary thrombocytopenia: Secondary | ICD-10-CM | POA: Diagnosis not present

## 2023-02-01 DIAGNOSIS — D696 Thrombocytopenia, unspecified: Secondary | ICD-10-CM | POA: Diagnosis not present

## 2023-02-01 LAB — CMP (CANCER CENTER ONLY)
ALT: 12 U/L (ref 0–44)
AST: 15 U/L (ref 15–41)
Albumin: 4.7 g/dL (ref 3.5–5.0)
Alkaline Phosphatase: 78 U/L (ref 38–126)
Anion gap: 10 (ref 5–15)
BUN: 9 mg/dL (ref 8–23)
CO2: 24 mmol/L (ref 22–32)
Calcium: 9.6 mg/dL (ref 8.9–10.3)
Chloride: 107 mmol/L (ref 98–111)
Creatinine: 1.12 mg/dL — ABNORMAL HIGH (ref 0.44–1.00)
GFR, Estimated: 53 mL/min — ABNORMAL LOW (ref >60)
Glucose, Bld: 151 mg/dL — ABNORMAL HIGH (ref 70–99)
Potassium: 4.6 mmol/L (ref 3.5–5.1)
Sodium: 141 mmol/L (ref 135–145)
Total Bilirubin: 0.4 mg/dL (ref 0.3–1.2)
Total Protein: 7.2 g/dL (ref 6.5–8.1)

## 2023-02-01 LAB — CBC WITH DIFFERENTIAL (CANCER CENTER ONLY)
Abs Immature Granulocytes: 0.02 10*3/uL (ref 0.00–0.07)
Basophils Absolute: 0.1 10*3/uL (ref 0.0–0.1)
Basophils Relative: 1 %
Eosinophils Absolute: 0.2 10*3/uL (ref 0.0–0.5)
Eosinophils Relative: 3 %
HCT: 43.9 % (ref 36.0–46.0)
Hemoglobin: 13.9 g/dL (ref 12.0–15.0)
Immature Granulocytes: 0 %
Lymphocytes Relative: 24 %
Lymphs Abs: 1.3 10*3/uL (ref 0.7–4.0)
MCH: 30.2 pg (ref 26.0–34.0)
MCHC: 31.7 g/dL (ref 30.0–36.0)
MCV: 95.2 fL (ref 80.0–100.0)
Monocytes Absolute: 0.4 10*3/uL (ref 0.1–1.0)
Monocytes Relative: 7 %
Neutro Abs: 3.6 10*3/uL (ref 1.7–7.7)
Neutrophils Relative %: 65 %
Platelet Count: 64 10*3/uL — ABNORMAL LOW (ref 150–400)
RBC: 4.61 MIL/uL (ref 3.87–5.11)
RDW: 13 % (ref 11.5–15.5)
WBC Count: 5.5 10*3/uL (ref 4.0–10.5)
nRBC: 0 % (ref 0.0–0.2)

## 2023-02-01 LAB — VITAMIN B12: Vitamin B-12: 405 pg/mL (ref 180–914)

## 2023-02-01 MED ORDER — CYANOCOBALAMIN 1000 MCG/ML IJ SOLN
1000.0000 ug | Freq: Once | INTRAMUSCULAR | Status: AC
  Administered 2023-02-01: 1000 ug via INTRAMUSCULAR
  Filled 2023-02-01: qty 1

## 2023-02-01 NOTE — Progress Notes (Signed)
Hematology and Oncology Follow Up Visit  Ebony Garcia [id number] 1951-10-11 72 y.o. 02/01/2023   Principle Diagnosis:  Medication induced thrombocytopenia B 12 deficiency    Current Therapy:        Observation B 12 injection monthly   Interim History: Ebony Garcia is here today for follow-up and B 12 injection. She has been having intermittent chest pains and palpitations as well as the occasional sharp pain in the left side of her head that comes and goes. She has an appointment with her PCP this week on Thursday for further evaluation.  No fever, chills, n/v, cough, rash, dizziness, SOB, abdominal pain or changes in bowel or bladder habits at this time.  No falls or syncope reported.  No swelling, tenderness, numbness or tingling in her extremities at this time.  Appetite and hydration are good. Weight is stable at 173 lbs.   ECOG Performance Status: 1 - Symptomatic but completely ambulatory  Medications:  Allergies as of 02/01/2023       Reactions   Amoxicillin-pot Clavulanate Diarrhea, Other (See Comments)   Antihistamines, Diphenhydramine-type Other (See Comments)   Other Reaction: makes pt nervous   Diphenhydramine Hcl Other (See Comments)   Other Reaction: makes pt nervous   Fremanezumab-vfrm Rash, Other (See Comments)   Seizure, jerking   Other Other (See Comments)   Tramadol Hcl Other (See Comments)   Clindamycin Other (See Comments)   She has not taken but per patient  "her mother had a reaction and she does not want it ever"   Hydrocodone-acetaminophen Other (See Comments)   REACTION: sensitive to  but can take for extreme pain per patient   Lisinopril    Restless , irregular sleep   Nortriptyline Other (See Comments)   UNKNOWN REACTION   Propoxyphene N-acetaminophen Nausea Only   Sulfa Antibiotics Other (See Comments)   Unknown reaction   Tramadol Other (See Comments)   UNKNOWN REACTION        Medication List        Accurate as of February 01, 2023  2:35 PM. If you have any questions, ask your nurse or doctor.          ALPRAZolam 1 MG tablet Commonly known as: XANAX TAKE 1 TABLET BY MOUTH EVERY DAY AT BEDTIME AS NEEDED FOR SLEEP   amLODipine 5 MG tablet Commonly known as: NORVASC Take 1 tablet (5 mg total) by mouth daily.   aspirin EC 81 MG tablet Take 1 tablet (81 mg total) by mouth daily. Swallow whole.   azithromycin 250 MG tablet Commonly known as: Zithromax Z-Pak As directed   FLUoxetine 20 MG capsule Commonly known as: PROZAC TAKE 1 CAPSULE EVERY DAY   furosemide 20 MG tablet Commonly known as: LASIX TAKE 1 TABLET EVERY DAY   metoCLOPramide 10 MG tablet Commonly known as: Reglan Take 1 tablet (10 mg total) by mouth every 6 (six) hours as needed for nausea.   triamcinolone ointment 0.1 % Commonly known as: KENALOG Apply topically as needed   Vyepti 100 MG/ML injection Generic drug: Eptinezumab-jjmr Infusion q12 weeks        Allergies:  Allergies  Allergen Reactions   Amoxicillin-Pot Clavulanate Diarrhea and Other (See Comments)   Antihistamines, Diphenhydramine-Type Other (See Comments)    Other Reaction: makes pt nervous   Diphenhydramine Hcl Other (See Comments)    Other Reaction: makes pt nervous   Fremanezumab-Vfrm Rash and Other (See Comments)    Seizure, jerking   Other Other (See Comments)  Tramadol Hcl Other (See Comments)   Clindamycin Other (See Comments)    She has not taken but per patient  "her mother had a reaction and she does not want it ever"   Hydrocodone-Acetaminophen Other (See Comments)    REACTION: sensitive to  but can take for extreme pain per patient   Lisinopril     Restless , irregular sleep   Nortriptyline Other (See Comments)    UNKNOWN REACTION   Propoxyphene N-Acetaminophen Nausea Only   Sulfa Antibiotics Other (See Comments)    Unknown reaction   Tramadol Other (See Comments)    UNKNOWN REACTION    Past Medical History, Surgical history,  Social history, and Family History were reviewed and updated.  Review of Systems: All other 10 point review of systems is negative.   Physical Exam:  vitals were not taken for this visit.   Wt Readings from Last 3 Encounters:  12/16/22 176 lb (79.8 kg)  11/05/22 177 lb 12.8 oz (80.6 kg)  10/06/22 175 lb (79.4 kg)    Ocular: Sclerae unicteric, pupils equal, round and reactive to light Ear-nose-throat: Oropharynx clear, dentition fair Lymphatic: No cervical or supraclavicular adenopathy Lungs no rales or rhonchi, good excursion bilaterally Heart regular rate and rhythm, no murmur appreciated Abd soft, nontender, positive bowel sounds MSK no focal spinal tenderness, no joint edema Neuro: non-focal, well-oriented, appropriate affect Breasts: Deferred   Lab Results  Component Value Date   WBC 7.4 11/05/2022   HGB 13.4 11/05/2022   HCT 40.2 11/05/2022   MCV 92.4 11/05/2022   PLT 78.0 (L) 11/05/2022   No results found for: "FERRITIN", "IRON", "TIBC", "UIBC", "IRONPCTSAT" Lab Results  Component Value Date   RBC 4.35 11/05/2022   No results found for: "KPAFRELGTCHN", "LAMBDASER", "KAPLAMBRATIO" No results found for: "IGGSERUM", "IGA", "IGMSERUM" No results found for: "TOTALPROTELP", "ALBUMINELP", "A1GS", "A2GS", "BETS", "BETA2SER", "GAMS", "MSPIKE", "SPEI"   Chemistry      Component Value Date/Time   NA 142 11/05/2022 1601   NA 143 11/11/2017 1339   K 3.5 11/05/2022 1601   K 3.6 11/11/2017 1339   CL 109 11/05/2022 1601   CL 109 (H) 11/11/2017 1339   CO2 24 11/05/2022 1601   CO2 24 11/11/2017 1339   BUN 13 11/05/2022 1601   BUN 9 11/11/2017 1339   CREATININE 0.88 11/05/2022 1601   CREATININE 1.01 (H) 08/03/2022 1430   CREATININE 1.00 (H) 10/18/2020 1433      Component Value Date/Time   CALCIUM 9.2 11/05/2022 1601   CALCIUM 9.3 11/11/2017 1339   ALKPHOS 75 11/05/2022 1601   ALKPHOS 85 (H) 11/11/2017 1339   AST 19 11/05/2022 1601   AST 21 08/03/2022 1430   ALT 23  11/05/2022 1601   ALT 17 08/03/2022 1430   ALT 24 11/11/2017 1339   BILITOT 0.2 11/05/2022 1601   BILITOT 0.4 08/03/2022 1430       Impression and Plan: Ebony Garcia is a pleasant 72 yo caucasian female with thrombocytopenia and B12 deficiency.  B 12 injection given today as planned.  B 12 injection every 6 weeks.  Follow-up in 6 months.   Lottie Dawson, NP 2/26/20242:35 PM

## 2023-02-03 NOTE — Progress Notes (Unsigned)
NEUROLOGY FOLLOW UP OFFICE NOTE  Ebony Garcia [id number]  Assessment/Plan:   Migraine without aura, without status migrainosus, not intractable - doing well on Vyepti       Migraine prevention:  Vyepti '100mg'$  every 3 months; decrease topiramate to '50mg'$  at bedtime.  If doing well  next visit, try discontinuing it. Limit use of pain relievers to no more than 2 days out of week to prevent risk of rebound or medication-overuse headache. Keep headache diary Follow up 6 months.     Subjective:  Ebony Garcia is a 72 year old right-handed woman with hypertension, CHF, B12 deficiency, depression and hyperlipidemia who follows up for migraines.   UPDATE Cut down topiramate to '50mg'$  twice daily.  Still no migraines.      Current NSAIDS:  ASA '81mg'$  Current analgesics:  no. Current triptans:   none Current anti-emetic:  no Current muscle relaxants:  no Current anti-anxiolytic:  Xanax '1mg'$  Current sleep aide:  Xanax '1mg'$  Current Antihypertensive medications:  Lasix Current Antidepressant medications:  Prozac '20mg'$  Current Anticonvulsant medications:  topiramate '50mg'$  BID Current anti-CGRP:  Vyepti '100mg'$     Depression and anxiety:  stable She reports off and on body aches, left hip pain Reports drinking 64 oz water daily    HISTORY: Onset:  Since her late 70s. Location: usually left sided, periorbital, and back of head.  More recently, holocephalic Quality:  Sharp, aching pressure.  Non-throbbing Initial intensity:  7/10; Sept: 7/10 Associated symptoms:  Nausea, photophobia and phonophobia.  No visual disturbance, osmophobia, vomiting or unilateral numbness or weakness Aura:  no Initial duration:  3 days; Sept: 30 minutes with Maxalt Initial frequency:  25 headache days per month Triggers:  Emotional stress Relieving factors:  Cold mask, heating pack on neck and shoulders   Past NSAIDs:  naproxen (ineffective), indomethacin, toradol tablet (ineffective), Toradol '60mg'$  IM  (effective), Cambia (side effects) Past analgesics:  Tylenol (ineffective), Excedrin (ineffective), Lidocaine nasal drops (effective but caused hives), tramadol (side effects), Midrin (effective but made her sleepy) Past triptans/ergots:  sumatriptan '100mg'$ , Zomig '5mg'$  (effective), Frova, Relpax, DHE (effective), Maxalt (effective but unable to afford it), Tosymra NS, Zomig '5mg'$  NS Past antiemetic:  Promethazine, Zofran, Reglan Past anxiolytics:  Vicodin Past muscle relaxants:  cyclobenzaprine Past antihypertensives:  propranolol (ineffective), atenolol (ineffective), lisinopril, metoprolol, HCTZ, losartan Past antidepressants:  amitriptyline (side effects), venlafaxine (ineffective), nortriptyline (effective but side effects such as rash), imipramine Past antiepileptics:  Depakote (hair loss), lamotrigine (side effects), zonisamide (side effects), gabapentin, Keppra Past CGRP inhibitor:  Aimovig (effective but concerned about side effects- ankle jerking, abnormal skin pattern), Ajovy (same side effects); Emgality (cost) Past vitamins and supplements:  magnesium (ineffective), feverfew (ineffective) Other past therapy:  Botox (one round, flu like symptoms, unable to afford 20% copay), occipital nerve blocks, trigger point injections, alternating heat/ice   Family history of headache:  Mother, grandmother, great-aunt, cousins.   08/16/08 MRI Brain w/wo performed for "explosive headaches": nonspecific punctate hyperintensities in the subcortical white matter. 08/16/08 MRA Head: Unremarkable.  There is mild attenuation in the vertebrobasilar junction and proximal basilar artery which is likely artifact. 07/05/19 CTA of Head:  Unremarkable for mass lesion, high-grade arterial stenosis, aneurysm or other acute intracranial abnormality.  PAST MEDICAL HISTORY: Past Medical History:  Diagnosis Date   Acute diastolic CHF (congestive heart failure) ([place]) 04/30/2013   99991111 acute diastolic heart failure   BNP 1528 2 D echocardiogram essentially negative  Chest x-ray revealed cardiomegaly with costophrenic angle blunting and suggestion of possible interstitial vascular accentuation;  no frank heart failure present. 10 pound weight loss with 20 mg of furosemide daily  No definite etiology of the cardiomegaly or acute diastolic failure    Anxiety 02/07/2015   Auditory hallucination 10/06/2017   B12 deficiency 10/07/2018   Bradycardia 04/30/2013   Cancer ([place])    Chest pain 05/14/2020   Chest pain with moderate risk of acute coronary syndrome 02/07/2015   CHF (congestive heart failure) ([place])    dystolic heart dysfunction   Chronic kidney disease    De Quervain's tenosynovitis, left 05/23/2013   Depression    Depression with anxiety    Depressive disorder, not elsewhere classified 05/31/2007   Centricity Description: DISORDER, DEPRESSIVE NEC Qualifier: Diagnosis of  By: Linna Darner MD, [place], [place]    Diabetes mellitus without complication [facility])    Diet controlled   Dysmetabolic syndrome X [id number]   Qualifier: Diagnosis of  By: Linna Darner MD, [place]  A1c 6.3% in 2/12; TG 278    Essential hypertension 08/09/2008   Qualifier: Diagnosis of  By: Linna Darner MD, [place]     Estrogen deficiency 02/02/2019   Estrogen deficiency 02/02/2019   Fatigue 02/02/2019   Hair loss 02/02/2019   Hyperlipidemia 02/27/2008   Qualifier: Diagnosis of  By: Linna Darner MD, [place]     Hypertension    Intertrigo    INTERTRIGO 11/08/2009   Qualifier: Diagnosis of  By: Inda Castle FNP, Melissa S    Intractable migraine with aura without status migrainosus 08/03/2018   Migraine variant 08/15/2008   Qualifier: Diagnosis of  By: Linna Darner MD, [place]   Onset:initially @ age 58;recurrence age 83 during 1st trimester. Recurrence @ 39. No prodrome or aura Previously on Tryptans & Topamax with temporary relief but tolerance appeared Botox by Dr Leward Quan 09/2010  most effective     Migraines    Myalgia 02/02/2019    NONSPECIFIC ABNORMAL ELECTROCARDIOGRAM 08/15/2008   Qualifier: Diagnosis of  By: Linna Darner MD, [place]     Palpitation 04/17/2014   Panic attacks    Panic attacks    Polydipsia 11/02/2007   Qualifier: Diagnosis of  By: Linna Darner MD, [place]     RENAL CALCULUS 02/21/2007   Qualifier: Diagnosis of  By: Allen Norris     Squamous cell carcinoma in situ of skin 12/26/2015   Thrombocytopenia ([place]) 10/06/2017   Viral syndrome 04/10/2019   WEIGHT GAIN 11/02/2007   Qualifier: Diagnosis of  By: Linna Darner MD, Gwyndolyn Saxon      MEDICATIONS: Current Outpatient Medications on File Prior to Visit  Medication Sig Dispense Refill   ALPRAZolam (XANAX) 1 MG tablet TAKE 1 TABLET BY MOUTH EVERY DAY AT BEDTIME AS NEEDED FOR SLEEP 30 tablet 0   amLODipine (NORVASC) 5 MG tablet Take 1 tablet (5 mg total) by mouth daily. 90 tablet 1   aspirin EC 81 MG tablet Take 1 tablet (81 mg total) by mouth daily. Swallow whole. 30 tablet 11   azithromycin (ZITHROMAX Z-PAK) 250 MG tablet As directed 6 each 0   Eptinezumab-jjmr (VYEPTI) 100 MG/ML injection Infusion q12 weeks 1.12 mL 0   FLUoxetine (PROZAC) 20 MG capsule TAKE 1 CAPSULE EVERY DAY 90 capsule 1   furosemide (LASIX) 20 MG tablet TAKE 1 TABLET EVERY DAY 90 tablet 1   metoCLOPramide (REGLAN) 10 MG tablet Take 1 tablet (10 mg total) by mouth every 6 (six) hours as needed for nausea. 20 tablet 5   triamcinolone ointment (KENALOG) 0.1 % Apply topically as needed     No current  facility-administered medications on file prior to visit.    ALLERGIES: Allergies  Allergen Reactions   Amoxicillin-Pot Clavulanate Diarrhea and Other (See Comments)   Antihistamines, Diphenhydramine-Type Other (See Comments)    Other Reaction: makes pt nervous   Diphenhydramine Hcl Other (See Comments)    Other Reaction: makes pt nervous   Fremanezumab-Vfrm Rash and Other (See Comments)    Seizure, jerking   Other Other (See Comments)   Tramadol Hcl Other (See Comments)   Clindamycin Other (See  Comments)    She has not taken but per patient  "her mother had a reaction and she does not want it ever"   Hydrocodone-Acetaminophen Other (See Comments)    REACTION: sensitive to  but can take for extreme pain per patient   Lisinopril     Restless , irregular sleep   Nortriptyline Other (See Comments)    UNKNOWN REACTION   Propoxyphene N-Acetaminophen Nausea Only   Sulfa Antibiotics Other (See Comments)    Unknown reaction   Tramadol Other (See Comments)    UNKNOWN REACTION    FAMILY HISTORY: Family History  Problem Relation Age of Onset   Crohn's disease Mother    Atrial fibrillation Mother    Heart failure Mother    Bladder Cancer Father    Cancer Father        bladder   Bipolar disorder Brother    Cancer Brother    Bipolar disorder Sister    Heart attack Maternal Grandmother        >65   Cancer Maternal Aunt    Colon cancer Maternal Uncle    Cancer Paternal Grandfather    Cancer Maternal Aunt    Cancer Cousin    Anemia Cousin    Diabetes Neg Hx    Stroke Neg Hx       Objective:  *** General: No acute distress.  Patient appears well-groomed.   Head:  Normocephalic/atraumatic Eyes:  Fundi examined but not visualized Heart:  Regular rate and rhythm Neurological Exam: alert and oriented to person, place, and time.  Speech fluent and not dysarthric, language intact.  CN II-XII intact. Bulk and tone normal, muscle strength 5/5 throughout.  Sensation to light touch intact.  Deep tendon reflexes 2+ upper extremities, absent lower extremities.  Finger to nose testing intact.  Gait antalgic, Romberg negative.   Metta Clines, DO  CC: Roma Schanz, DO

## 2023-02-04 ENCOUNTER — Other Ambulatory Visit (INDEPENDENT_AMBULATORY_CARE_PROVIDER_SITE_OTHER): Payer: Medicare HMO

## 2023-02-04 DIAGNOSIS — E782 Mixed hyperlipidemia: Secondary | ICD-10-CM | POA: Diagnosis not present

## 2023-02-04 LAB — COMPREHENSIVE METABOLIC PANEL
ALT: 13 U/L (ref 0–35)
AST: 15 U/L (ref 0–37)
Albumin: 4.2 g/dL (ref 3.5–5.2)
Alkaline Phosphatase: 86 U/L (ref 39–117)
BUN: 16 mg/dL (ref 6–23)
CO2: 24 mEq/L (ref 19–32)
Calcium: 9.2 mg/dL (ref 8.4–10.5)
Chloride: 108 mEq/L (ref 96–112)
Creatinine, Ser: 1.02 mg/dL (ref 0.40–1.20)
GFR: 56.19 mL/min — ABNORMAL LOW (ref >60.00)
Glucose, Bld: 103 mg/dL — ABNORMAL HIGH (ref 70–99)
Potassium: 3.9 mEq/L (ref 3.5–5.1)
Sodium: 141 mEq/L (ref 135–145)
Total Bilirubin: 0.5 mg/dL (ref 0.2–1.2)
Total Protein: 6.7 g/dL (ref 6.0–8.3)

## 2023-02-04 LAB — LIPID PANEL
Cholesterol: 184 mg/dL (ref 0–200)
HDL: 52.9 mg/dL (ref >39.00)
LDL Cholesterol: 99 mg/dL (ref 0–99)
NonHDL: 130.78
Total CHOL/HDL Ratio: 3
Triglycerides: 158 mg/dL — ABNORMAL HIGH (ref 0.0–149.0)
VLDL: 31.6 mg/dL (ref 0.0–40.0)

## 2023-02-05 ENCOUNTER — Encounter: Payer: Self-pay | Admitting: Neurology

## 2023-02-05 ENCOUNTER — Ambulatory Visit: Payer: Medicare HMO | Admitting: Neurology

## 2023-02-05 VITALS — BP 132/72 | HR 76 | Resp 18 | Ht 62.0 in | Wt 174.0 lb

## 2023-02-05 DIAGNOSIS — E119 Type 2 diabetes mellitus without complications: Secondary | ICD-10-CM | POA: Diagnosis not present

## 2023-02-05 DIAGNOSIS — G43009 Migraine without aura, not intractable, without status migrainosus: Secondary | ICD-10-CM

## 2023-02-05 DIAGNOSIS — I509 Heart failure, unspecified: Secondary | ICD-10-CM | POA: Diagnosis not present

## 2023-02-05 NOTE — Patient Instructions (Signed)
Continue Vyepti '100mg'$  every 3 months Decrease topiramate to '50mg'$  at bedtime

## 2023-02-10 ENCOUNTER — Other Ambulatory Visit: Payer: Self-pay | Admitting: Family Medicine

## 2023-02-10 DIAGNOSIS — F419 Anxiety disorder, unspecified: Secondary | ICD-10-CM

## 2023-02-11 ENCOUNTER — Telehealth: Payer: Self-pay | Admitting: *Deleted

## 2023-02-11 NOTE — Telephone Encounter (Signed)
Requesting: alprazolam '1mg'$   Contract: 05/05/22 UDS: 05/05/22 Last Visit: 11/05/22 Next Visit: None Last Refill: 01/04/23 #30 and 0RF  Please Advise

## 2023-02-11 NOTE — Telephone Encounter (Signed)
Message received from patient requesting to speak with Ebony Dawson NP regarding a need for a "shot for platelets" and an upcoming dental visit. Pt states that she has a dentist appointment again on 02/11/2023. Call placed back to patient and message left instructing pt to call office back to gather more information about dental procedure.

## 2023-02-26 DIAGNOSIS — M25552 Pain in left hip: Secondary | ICD-10-CM | POA: Diagnosis not present

## 2023-02-26 DIAGNOSIS — M545 Low back pain, unspecified: Secondary | ICD-10-CM | POA: Diagnosis not present

## 2023-03-07 ENCOUNTER — Other Ambulatory Visit: Payer: Self-pay | Admitting: Family Medicine

## 2023-03-07 DIAGNOSIS — I5189 Other ill-defined heart diseases: Secondary | ICD-10-CM

## 2023-03-08 DIAGNOSIS — M25552 Pain in left hip: Secondary | ICD-10-CM | POA: Diagnosis not present

## 2023-03-12 DIAGNOSIS — S76312D Strain of muscle, fascia and tendon of the posterior muscle group at thigh level, left thigh, subsequent encounter: Secondary | ICD-10-CM | POA: Diagnosis not present

## 2023-03-16 ENCOUNTER — Inpatient Hospital Stay: Payer: Medicare HMO | Attending: Family

## 2023-03-16 VITALS — BP 114/72 | HR 72 | Temp 97.9°F | Resp 20

## 2023-03-16 DIAGNOSIS — D6959 Other secondary thrombocytopenia: Secondary | ICD-10-CM | POA: Diagnosis not present

## 2023-03-16 DIAGNOSIS — E538 Deficiency of other specified B group vitamins: Secondary | ICD-10-CM | POA: Insufficient documentation

## 2023-03-16 MED ORDER — CYANOCOBALAMIN 1000 MCG/ML IJ SOLN
1000.0000 ug | Freq: Once | INTRAMUSCULAR | Status: AC
  Administered 2023-03-16: 1000 ug via INTRAMUSCULAR
  Filled 2023-03-16: qty 1

## 2023-03-16 NOTE — Patient Instructions (Signed)

## 2023-03-17 ENCOUNTER — Ambulatory Visit (INDEPENDENT_AMBULATORY_CARE_PROVIDER_SITE_OTHER): Payer: Medicare HMO

## 2023-03-17 ENCOUNTER — Encounter: Payer: Self-pay | Admitting: Family Medicine

## 2023-03-17 VITALS — BP 107/69 | HR 60 | Temp 97.9°F | Resp 16 | Ht 62.0 in | Wt 176.6 lb

## 2023-03-17 DIAGNOSIS — G43119 Migraine with aura, intractable, without status migrainosus: Secondary | ICD-10-CM | POA: Diagnosis not present

## 2023-03-17 LAB — HM DIABETES EYE EXAM

## 2023-03-17 MED ORDER — SODIUM CHLORIDE 0.9 % IV SOLN
100.0000 mg | Freq: Once | INTRAVENOUS | Status: AC
  Administered 2023-03-17: 100 mg via INTRAVENOUS
  Filled 2023-03-17: qty 1

## 2023-03-17 NOTE — Progress Notes (Signed)
Diagnosis: Intractable migraine with aura without status migrainosus [G43.119]  Provider:  Chilton Greathouse MD  Procedure: Infusion  IV Type: Peripheral, IV Location: L Antecubital  Vyepti (Eptinezumab-jjmr), Dose: 200 mg  Infusion Start Time: 1447  Infusion Stop Time: 1523  Post Infusion IV Care: Peripheral IV Discontinued  Discharge: Condition: Good, Destination: Home . AVS Provided  Performed by:  Loney Hering, LPN

## 2023-03-19 ENCOUNTER — Telehealth: Payer: Self-pay | Admitting: Family Medicine

## 2023-03-19 NOTE — Telephone Encounter (Signed)
Okay to complete?

## 2023-03-19 NOTE — Telephone Encounter (Signed)
Patient called wanting to get information on how to apply for a handicap sticker. She stated she has a very bad hamstring surgery and she would like to know how to go about it. Please advice.

## 2023-03-23 NOTE — Telephone Encounter (Signed)
Form completed and placed at front desk for pick up. Copy sent for scanning. Mychart message sent to Pt informing ready for pick up.

## 2023-03-24 ENCOUNTER — Other Ambulatory Visit: Payer: Self-pay | Admitting: Family Medicine

## 2023-03-24 DIAGNOSIS — F419 Anxiety disorder, unspecified: Secondary | ICD-10-CM

## 2023-03-24 NOTE — Telephone Encounter (Signed)
Requesting: alprazolam  Contract: Yes UDS: 04/25/22 Last Visit: 11/05/2022 Next Visit: Visit date not found Last Refill: 02/10/22  Please Advise

## 2023-04-01 ENCOUNTER — Other Ambulatory Visit: Payer: Self-pay | Admitting: Family Medicine

## 2023-04-01 DIAGNOSIS — I10 Essential (primary) hypertension: Secondary | ICD-10-CM

## 2023-04-27 ENCOUNTER — Inpatient Hospital Stay: Payer: Medicare HMO | Attending: Family

## 2023-04-27 VITALS — BP 125/71 | HR 82 | Temp 98.2°F | Resp 18

## 2023-04-27 DIAGNOSIS — D6959 Other secondary thrombocytopenia: Secondary | ICD-10-CM | POA: Insufficient documentation

## 2023-04-27 DIAGNOSIS — E538 Deficiency of other specified B group vitamins: Secondary | ICD-10-CM | POA: Diagnosis not present

## 2023-04-27 MED ORDER — CYANOCOBALAMIN 1000 MCG/ML IJ SOLN
1000.0000 ug | Freq: Once | INTRAMUSCULAR | Status: AC
  Administered 2023-04-27: 1000 ug via INTRAMUSCULAR
  Filled 2023-04-27: qty 1

## 2023-04-27 NOTE — Patient Instructions (Signed)
Vitamin B12 Deficiency Vitamin B12 deficiency means that your body does not have enough vitamin B12. The body needs this important vitamin: To make red blood cells. To make genes (DNA). To help the nerves work. If you do not have enough vitamin B12 in your body, you can have health problems, such as not having enough red blood cells in the blood (anemia). What are the causes? Not eating enough foods that contain vitamin B12. Not being able to take in (absorb) vitamin B12 from the food that you eat. Certain diseases. A condition in which the body does not make enough of a certain protein. This results in your body not taking in enough vitamin B12. Having a surgery in which part of the stomach or small intestine is taken out. Taking medicines that make it hard for the body to take in vitamin B12. These include: Heartburn medicines. Some medicines that are used to treat diabetes. What increases the risk? Being an older adult. Eating a vegetarian or vegan diet that does not include any foods that come from animals. Not eating enough foods that contain vitamin B12 while you are pregnant. Taking certain medicines. Having alcoholism. What are the signs or symptoms? In some cases, there are no symptoms. If the condition leads to too few blood cells or nerve damage, symptoms can occur, such as: Feeling weak or tired. Not being hungry. Losing feeling (numbness) or tingling in your hands and feet. Redness and burning of the tongue. Feeling sad (depressed). Confusion or memory problems. Trouble walking. If anemia is very bad, symptoms can include: Being short of breath. Being dizzy. Having a very fast heartbeat. How is this treated? Changing the way you eat and drink, such as: Eating more foods that contain vitamin B12. Drinking little or no alcohol. Getting vitamin B12 shots. Taking vitamin B12 supplements by mouth (orally). Your doctor will tell you the dose that is best for you. Follow  these instructions at home: Eating and drinking  Eat foods that come from animals and have a lot of vitamin B12 in them. These include: Meats and poultry. This includes beef, pork, chicken, turkey, and organ meats, such as liver. Seafood, such as clams, rainbow trout, salmon, tuna, and haddock. Eggs. Dairy foods such as milk, yogurt, and cheese. Eat breakfast cereals that have vitamin B12 added to them (are fortified). Check the label. The items listed above may not be a complete list of foods and beverages you can eat and drink. Contact a dietitian for more information. Alcohol use Do not drink alcohol if: Your doctor tells you not to drink. You are pregnant, may be pregnant, or are planning to become pregnant. If you drink alcohol: Limit how much you have to: 0-1 drink a day for women. 0-2 drinks a day for men. Know how much alcohol is in your drink. In the [place] one drink equals one 12 oz bottle of beer (355 mL), one 5 oz glass of wine (148 mL), or one 1 oz glass of hard liquor (44 mL). General instructions Get any vitamin B12 shots if told by your doctor. Take supplements only as told by your doctor. Follow the directions. Keep all follow-up visits. Contact a doctor if: Your symptoms come back. Your symptoms get worse or do not get better with treatment. Get help right away if: You have trouble breathing. You have a very fast heartbeat. You have chest pain. You get dizzy. You faint. These symptoms may be an emergency. Get help right away. Call 911.   Do not wait to see if the symptoms will go away. Do not drive yourself to the hospital. Summary Vitamin B12 deficiency means that your body is not getting enough of the vitamin. In some cases, there are no symptoms of this condition. Treatment may include making a change in the way you eat and drink, getting shots, or taking supplements. Eat foods that have vitamin B12 in them. This information is not intended to replace advice  given to you by your health care provider. Make sure you discuss any questions you have with your health care provider. Document Revised: 07/18/2021 Document Reviewed: 07/18/2021 Elsevier Patient Education  2023 [employer]  

## 2023-05-02 ENCOUNTER — Other Ambulatory Visit: Payer: Self-pay | Admitting: Family Medicine

## 2023-05-02 DIAGNOSIS — F419 Anxiety disorder, unspecified: Secondary | ICD-10-CM

## 2023-05-04 NOTE — Telephone Encounter (Signed)
Requesting: Xanax Contract: 05/05/22 UDS: 05/05/22 Last OV: 11/05/22 Next OV: 05/06/23 Last Refill: 03/26/2023, #30--0 RF Database:   Please advise

## 2023-05-06 ENCOUNTER — Ambulatory Visit: Payer: Medicare HMO | Admitting: Family Medicine

## 2023-06-02 DIAGNOSIS — M25552 Pain in left hip: Secondary | ICD-10-CM | POA: Diagnosis not present

## 2023-06-08 ENCOUNTER — Inpatient Hospital Stay: Payer: Medicare HMO | Attending: Family

## 2023-06-08 VITALS — BP 120/79 | HR 81 | Temp 98.1°F | Resp 20

## 2023-06-08 DIAGNOSIS — E538 Deficiency of other specified B group vitamins: Secondary | ICD-10-CM | POA: Diagnosis not present

## 2023-06-08 DIAGNOSIS — D6959 Other secondary thrombocytopenia: Secondary | ICD-10-CM | POA: Diagnosis not present

## 2023-06-08 MED ORDER — CYANOCOBALAMIN 1000 MCG/ML IJ SOLN
1000.0000 ug | Freq: Once | INTRAMUSCULAR | Status: AC
  Administered 2023-06-08: 1000 ug via INTRAMUSCULAR
  Filled 2023-06-08: qty 1

## 2023-06-08 NOTE — Patient Instructions (Signed)

## 2023-06-13 ENCOUNTER — Other Ambulatory Visit: Payer: Self-pay | Admitting: Family Medicine

## 2023-06-13 DIAGNOSIS — I10 Essential (primary) hypertension: Secondary | ICD-10-CM

## 2023-06-14 ENCOUNTER — Other Ambulatory Visit: Payer: Self-pay | Admitting: Family Medicine

## 2023-06-14 DIAGNOSIS — F419 Anxiety disorder, unspecified: Secondary | ICD-10-CM

## 2023-06-16 ENCOUNTER — Ambulatory Visit: Payer: Medicare HMO

## 2023-06-16 VITALS — BP 112/71 | HR 63 | Temp 97.6°F | Resp 16 | Ht 62.0 in | Wt 177.2 lb

## 2023-06-16 DIAGNOSIS — G43119 Migraine with aura, intractable, without status migrainosus: Secondary | ICD-10-CM | POA: Diagnosis not present

## 2023-06-16 MED ORDER — SODIUM CHLORIDE 0.9 % IV SOLN
100.0000 mg | Freq: Once | INTRAVENOUS | Status: AC
  Administered 2023-06-16: 100 mg via INTRAVENOUS
  Filled 2023-06-16: qty 1

## 2023-06-16 NOTE — Progress Notes (Signed)
Diagnosis: Migraine  Provider:  Chilton Greathouse MD  Procedure: IV Infusion  IV Type: Peripheral, IV Location: R Antecubital  Vyepti (Eptinezumab-jjmr), Dose: 100 mg  Infusion Start Time: 1456  Infusion Stop Time: 1528  Post Infusion IV Care: Patient declined observation and Peripheral IV Discontinued  Discharge: Condition: Stable, Destination: Home . AVS Declined  Performed by:  Wyvonne Lenz, RN

## 2023-06-17 ENCOUNTER — Other Ambulatory Visit: Payer: Self-pay | Admitting: Family Medicine

## 2023-06-17 DIAGNOSIS — F419 Anxiety disorder, unspecified: Secondary | ICD-10-CM

## 2023-06-17 MED ORDER — ALPRAZOLAM 1 MG PO TABS
ORAL_TABLET | ORAL | 0 refills | Status: DC
Start: 2023-06-17 — End: 2023-07-28

## 2023-06-17 NOTE — Addendum Note (Signed)
Addended by: Roxanne Gates on: 06/17/2023 01:51 PM   Modules accepted: Orders

## 2023-06-17 NOTE — Telephone Encounter (Signed)
Requesting: Xanax Contract: 05/05/22 UDS: 05/05/22 Last OV: 11/05/22 Next OV: 06/21/2023 Last Refill: 05/04/23, #30--0 RF Database:   Please advise

## 2023-06-17 NOTE — Telephone Encounter (Signed)
Requesting: alprazolam 1mg   Contract: 05/27/22 UDS: 05/05/22 Last Visit: 11/05/22 Next Visit: 06/21/23 Last Refill: 05/04/23 #30 and 0RF  Please Advise

## 2023-06-17 NOTE — Telephone Encounter (Signed)
Patient called and needs a med refill on  ALPRAZolam Prudy Feeler) 1 MG tablet   states already has an appt on Monday with provider and needs med refill by tonight.

## 2023-06-21 ENCOUNTER — Encounter: Payer: Self-pay | Admitting: Family Medicine

## 2023-06-21 ENCOUNTER — Ambulatory Visit: Payer: Medicare HMO | Admitting: Family Medicine

## 2023-06-21 VITALS — BP 108/80 | HR 81 | Temp 97.9°F | Resp 18 | Ht 62.0 in | Wt 179.0 lb

## 2023-06-21 DIAGNOSIS — D049 Carcinoma in situ of skin, unspecified: Secondary | ICD-10-CM | POA: Diagnosis not present

## 2023-06-21 DIAGNOSIS — G43911 Migraine, unspecified, intractable, with status migrainosus: Secondary | ICD-10-CM

## 2023-06-21 DIAGNOSIS — I10 Essential (primary) hypertension: Secondary | ICD-10-CM

## 2023-06-21 DIAGNOSIS — I5031 Acute diastolic (congestive) heart failure: Secondary | ICD-10-CM

## 2023-06-21 DIAGNOSIS — E118 Type 2 diabetes mellitus with unspecified complications: Secondary | ICD-10-CM | POA: Diagnosis not present

## 2023-06-21 DIAGNOSIS — E559 Vitamin D deficiency, unspecified: Secondary | ICD-10-CM | POA: Diagnosis not present

## 2023-06-21 DIAGNOSIS — E119 Type 2 diabetes mellitus without complications: Secondary | ICD-10-CM

## 2023-06-21 DIAGNOSIS — I5032 Chronic diastolic (congestive) heart failure: Secondary | ICD-10-CM

## 2023-06-21 DIAGNOSIS — E538 Deficiency of other specified B group vitamins: Secondary | ICD-10-CM

## 2023-06-21 DIAGNOSIS — E782 Mixed hyperlipidemia: Secondary | ICD-10-CM

## 2023-06-21 DIAGNOSIS — C801 Malignant (primary) neoplasm, unspecified: Secondary | ICD-10-CM | POA: Diagnosis not present

## 2023-06-21 NOTE — Assessment & Plan Note (Signed)
SKIN -- squamous cell carcinoma

## 2023-06-21 NOTE — Assessment & Plan Note (Signed)
Cont diet

## 2023-06-21 NOTE — Assessment & Plan Note (Signed)
Controlled with vyepti

## 2023-06-21 NOTE — Progress Notes (Signed)
Established Patient Office Visit  Subjective   Patient ID: Ebony Garcia, female    DOB: [birth year]  Age: 72 y.o. MRN: [id number]  Chief Complaint  Patient presents with   Hypertension   Anxiety   Follow-up    HPI Discussed the use of AI scribe software for clinical note transcription with the patient, who gave verbal consent to proceed.  History of Present Illness   The patient, with a history of migraines, hypertension, and anxiety, presents for a routine follow-up. She reports significant improvement in her migraines since starting Vyepti infusions every three months, with no migraines since April of last year. She also reports no regular headaches. They have some fatigue, which she attributes to both the [facility] and her low platelet count, but she prefers this to the pain of migraines.  She also reports a recent hamstring injury, which has been evaluated by an orthopedic surgeon. The surgeon determined that surgery was not necessary, and the patient has been managing the injury with rest, elevation, and occasional Tylenol. The injury is reported to be improving.  The patient also mentions family stressors, including a grandson who was recently assaulted and other family members who have been ill with COVID or strep throat.      Patient Active Problem List   Diagnosis Date Noted   Controlled type 2 diabetes mellitus with complication, without long-term current use of insulin (HCC) 06/21/2023   Acute non-recurrent pansinusitis 10/20/2021   Chronic cough 10/20/2021   Acute low back pain 02/24/2021   Lower abdominal pain 02/24/2021   Panic attacks    Migraines    Intertrigo    Hypertension    Diet-controlled diabetes mellitus (HCC)    Depression    Chronic kidney disease    CHF (congestive heart failure) (HCC)    Cancer (HCC)    Chest pain 05/14/2020   Viral syndrome 04/10/2019   Myalgia 02/02/2019   Fatigue 02/02/2019   Estrogen deficiency 02/02/2019   Hair loss  02/02/2019   B12 deficiency 10/07/2018   Intractable migraine with aura without status migrainosus 08/03/2018   Thrombocytopenia (HCC) 10/06/2017   Auditory hallucination 10/06/2017   Depression with anxiety 05/14/2017   Squamous cell carcinoma in situ of skin 12/26/2015   Chest pain with moderate risk of acute coronary syndrome 02/07/2015   Anxiety 02/07/2015   Palpitation 04/17/2014   De Quervain's tenosynovitis, left 05/23/2013   Acute diastolic CHF (congestive heart failure) (HCC) 04/30/2013   Bradycardia 04/30/2013   INTERTRIGO 11/08/2009   Migraine variant 08/15/2008   NONSPECIFIC ABNORMAL ELECTROCARDIOGRAM 08/15/2008   Essential hypertension 08/09/2008   Hyperlipidemia 02/27/2008   Dysmetabolic syndrome X 02/27/2008   WEIGHT GAIN 11/02/2007   POLYDIPSIA 11/02/2007   Depressive disorder, not elsewhere classified 05/31/2007   RENAL CALCULUS 02/21/2007   Past Medical History:  Diagnosis Date   Acute diastolic CHF (congestive heart failure) (HCC) 04/30/2013   5/24-27/14 acute diastolic heart failure  BNP 1528 2 D echocardiogram essentially negative  Chest x-ray revealed cardiomegaly with costophrenic angle blunting and suggestion of possible interstitial vascular accentuation; no frank heart failure present. 10 pound weight loss with 20 mg of furosemide daily  No definite etiology of the cardiomegaly or acute diastolic failure    Anxiety 02/07/2015   Auditory hallucination 10/06/2017   B12 deficiency 10/07/2018   Bradycardia 04/30/2013   Cancer (HCC)    Chest pain 05/14/2020   Chest pain with moderate risk of acute coronary syndrome 02/07/2015   CHF (congestive heart failure) (  HCC)    dystolic heart dysfunction   Chronic kidney disease    De Quervain's tenosynovitis, left 05/23/2013   Depression    Depression with anxiety    Depressive disorder, not elsewhere classified 05/31/2007   Centricity Description: DISORDER, DEPRESSIVE NEC Qualifier: Diagnosis of  By: Alwyn Ren MD, Bernardo Heater, [facility]    Diabetes mellitus without complication [facility])    Diet controlled   Dysmetabolic syndrome X 02/27/2008   Qualifier: Diagnosis of  By: Alwyn Ren MD, William  A1c 6.3% in 2/12; TG 278    Essential hypertension 08/09/2008   Qualifier: Diagnosis of  By: Alwyn Ren MD, William     Estrogen deficiency 02/02/2019   Estrogen deficiency 02/02/2019   Fatigue 02/02/2019   Hair loss 02/02/2019   Hyperlipidemia 02/27/2008   Qualifier: Diagnosis of  By: Alwyn Ren MD, William     Hypertension    Intertrigo    INTERTRIGO 11/08/2009   Qualifier: Diagnosis of  By: Peggyann Juba FNP, Melissa S    Intractable migraine with aura without status migrainosus 08/03/2018   Migraine variant 08/15/2008   Qualifier: Diagnosis of  By: Alwyn Ren MD, William   Onset:initially @ age 29;recurrence age 89 during 1st trimester. Recurrence @ 39. No prodrome or aura Previously on Tryptans & Topamax with temporary relief but tolerance appeared Botox by Dr Norval Gable 09/2010  most effective     Migraines    Myalgia 02/02/2019   NONSPECIFIC ABNORMAL ELECTROCARDIOGRAM 08/15/2008   Qualifier: Diagnosis of  By: Alwyn Ren MD, William     Palpitation 04/17/2014   Panic attacks    Panic attacks    Polydipsia 11/02/2007   Qualifier: Diagnosis of  By: Alwyn Ren MD, William     RENAL CALCULUS 02/21/2007   Qualifier: Diagnosis of  By: Daine Gip     Squamous cell carcinoma in situ of skin 12/26/2015   Thrombocytopenia (HCC) 10/06/2017   Viral syndrome 04/10/2019   WEIGHT GAIN 11/02/2007   Qualifier: Diagnosis of  By: Alwyn Ren MD, Chrissie Noa     Past Surgical History:  Procedure Laterality Date   ABDOMINAL HYSTERECTOMY     BREAST SURGERY     DILATION AND CURETTAGE OF UTERUS     NASAL SEPTUM SURGERY     papaloma Right    TONSILLECTOMY AND ADENOIDECTOMY     TOTAL ABDOMINAL HYSTERECTOMY W/ BILATERAL SALPINGOOPHORECTOMY     for fibroids and migraines   TUBAL LIGATION     Social History   Tobacco Use   Smoking  status: Never   Smokeless tobacco: Never  Vaping Use   Vaping status: Never Used  Substance Use Topics   Alcohol use: No    Alcohol/week: 0.0 standard drinks of alcohol   Drug use: No   Social History   Socioeconomic History   Marital status: Divorced    Spouse name: Not on file   Number of children: 3   Years of education: Not on file   Highest education level: 12th grade  Occupational History    Employer: UNEMPLOYED  Tobacco Use   Smoking status: Never   Smokeless tobacco: Never  Vaping Use   Vaping status: Never Used  Substance and Sexual Activity   Alcohol use: No    Alcohol/week: 0.0 standard drinks of alcohol   Drug use: No   Sexual activity: Not Currently    Partners: Male  Other Topics Concern   Not on file  Social History Narrative   Lives alone.  Patient is right-handed. She lives on the first floor.      Drinks caffeine   Social Determinants of Health   Financial Resource Strain: High Risk (04/30/2023)   Overall Financial Resource Strain (CARDIA)    Difficulty of Paying Living Expenses: Hard  Food Insecurity: Food Insecurity Present (04/30/2023)   Hunger Vital Sign    Worried About Running Out of Food in the Last Year: Sometimes true    Ran Out of Food in the Last Year: Sometimes true  Transportation Needs: No Transportation Needs (04/30/2023)   PRAPARE - Administrator, Civil Service (Medical): No    Lack of Transportation (Non-Medical): No  Physical Activity: Inactive (04/30/2023)   Exercise Vital Sign    Days of Exercise per Week: 0 days    Minutes of Exercise per Session: 0 min  Stress: No Stress Concern Present (04/30/2023)   [employer] of Occupational Health - Occupational Stress Questionnaire    Feeling of Stress : Only a little  Social Connections: Unknown (04/30/2023)   Social Connection and Isolation Panel [NHANES]    Frequency of Communication with Friends and Family: More than three times a week    Frequency of  Social Gatherings with Friends and Family: Three times a week    Attends Religious Services: Patient declined    Active Member of Clubs or Organizations: No    Attends Banker Meetings: Never    Marital Status: Divorced  Catering manager Violence: Not At Risk (01/29/2023)   Humiliation, Afraid, Rape, and Kick questionnaire    Fear of Current or Ex-Partner: No    Emotionally Abused: No    Physically Abused: No    Sexually Abused: No   Family Status  Relation Name Status   Mother  Alive   Father  Alive   Brother  Deceased at age 7       renal cell carcinoma    Sister  Animator  (Not Specified)   Son  Alive   Son  Alive   Son  Alive   Mat Aunt  (Not Specified)   Nurse, mental health  (Not Specified)   PGF  (Not Specified)   Mat Aunt  (Not Specified)   Cousin  (Not Specified)   Cousin  (Not Specified)   Neg Hx  (Not Specified)  No partnership data on file   Family History  Problem Relation Age of Onset   Crohn's disease Mother    Atrial fibrillation Mother    Heart failure Mother    Bladder Cancer Father    Cancer Father        bladder   Bipolar disorder Brother    Cancer Brother    Bipolar disorder Sister    Heart attack Maternal Grandmother        >65   Cancer Maternal Aunt    Colon cancer Maternal Uncle    Cancer Paternal Grandfather    Cancer Maternal Aunt    Cancer Cousin    Anemia Cousin    Diabetes Neg Hx    Stroke Neg Hx    Allergies  Allergen Reactions   Amoxicillin-Pot Clavulanate Diarrhea and Other (See Comments)   Antihistamines, Diphenhydramine-Type Other (See Comments)    Other Reaction: makes pt nervous   Diphenhydramine Hcl Other (See Comments)    Other Reaction: makes pt nervous   Fremanezumab-Vfrm Rash and Other (See Comments)    Seizure, jerking   Other Other (See Comments)   Tramadol Hcl Other (  See Comments)   Clindamycin Other (See Comments)    She has not taken but per patient  "her mother had a reaction and she does not want it  ever"   Hydrocodone-Acetaminophen Other (See Comments)    REACTION: sensitive to  but can take for extreme pain per patient   Lisinopril     Restless , irregular sleep   Nortriptyline Other (See Comments)    UNKNOWN REACTION   Propoxyphene N-Acetaminophen Nausea Only   Sulfa Antibiotics Other (See Comments)    Unknown reaction   Tramadol Other (See Comments)    UNKNOWN REACTION      ROS    Objective:     BP 108/80 (BP Location: Right Arm, Patient Position: Sitting, Cuff Size: Normal)   Pulse 81   Temp 97.9 F (36.6 C) (Oral)   Resp 18   Ht 5\' 2"  (1.575 m)   Wt 179 lb (81.2 kg)   SpO2 97%   BMI 32.74 kg/m  BP Readings from Last 3 Encounters:  06/21/23 108/80  06/16/23 112/71  06/08/23 120/79   Wt Readings from Last 3 Encounters:  06/21/23 179 lb (81.2 kg)  06/16/23 177 lb 3.2 oz (80.4 kg)  03/17/23 176 lb 9.6 oz (80.1 kg)   SpO2 Readings from Last 3 Encounters:  06/21/23 97%  06/16/23 99%  06/08/23 98%      Physical Exam   No results found for any visits on 06/21/23.  Last CBC Lab Results  Component Value Date   WBC 5.5 02/01/2023   HGB 13.9 02/01/2023   HCT 43.9 02/01/2023   MCV 95.2 02/01/2023   MCH 30.2 02/01/2023   RDW 13.0 02/01/2023   PLT 64 (L) 02/01/2023   Last metabolic panel Lab Results  Component Value Date   GLUCOSE 103 (H) 02/04/2023   NA 141 02/04/2023   K 3.9 02/04/2023   CL 108 02/04/2023   CO2 24 02/04/2023   BUN 16 02/04/2023   CREATININE 1.02 02/04/2023   GFR 56.19 (L) 02/04/2023   CALCIUM 9.2 02/04/2023   PROT 6.7 02/04/2023   ALBUMIN 4.2 02/04/2023   BILITOT 0.5 02/04/2023   ALKPHOS 86 02/04/2023   AST 15 02/04/2023   ALT 13 02/04/2023   ANIONGAP 10 02/01/2023   Last lipids Lab Results  Component Value Date   CHOL 184 02/04/2023   HDL 52.90 02/04/2023   LDLCALC 99 02/04/2023   LDLDIRECT 99.0 11/05/2022   TRIG 158.0 (H) 02/04/2023   CHOLHDL 3 02/04/2023   Last hemoglobin A1c Lab Results  Component Value  Date   HGBA1C 5.8 11/05/2022   Last thyroid functions Lab Results  Component Value Date   TSH 2.74 11/05/2022   Last vitamin D Lab Results  Component Value Date   VD25OH 36.76 11/05/2022   Last vitamin B12 and Folate Lab Results  Component Value Date   VITAMINB12 405 02/01/2023      The 10-year ASCVD risk score (Arnett DK, et al., 2019) is: 15%    Assessment & Plan:   Problem List Items Addressed This Visit       Unprioritized   CHF (congestive heart failure) (HCC)   Cancer (HCC)   Acute diastolic CHF (congestive heart failure) (HCC)   Squamous cell carcinoma in situ of skin    SKIN -- squamous cell carcinoma                     Migraines    Controlled with vyepti  Hypertension    Well controlled, no changes to meds. Encouraged heart healthy diet such as the DASH diet and exercise as tolerated.        Relevant Orders   CBC with Differential/Platelet   Comprehensive metabolic panel   TSH   Hyperlipidemia - Primary    Encourage heart healthy diet such as MIND or DASH diet, increase exercise, avoid trans fats, simple carbohydrates and processed foods, consider a krill or fish or flaxseed oil cap daily.        Relevant Orders   Lipid panel   Essential hypertension    Well controlled, no changes to meds. Encouraged heart healthy diet such as the DASH diet and exercise as tolerated.        Diet-controlled diabetes mellitus (HCC)    Con't diet       Relevant Orders   Hemoglobin A1c   Controlled type 2 diabetes mellitus with complication, without long-term current use of insulin (HCC)   Other Visit Diagnoses     Vitamin B12 deficiency       Relevant Orders   Vitamin B12   Vitamin D deficiency       Relevant Orders   VITAMIN D 25 Hydroxy (Vit-D Deficiency, Fractures)       Return in about 6 months (around 12/22/2023), or if symptoms worsen or fail to improve, for annual exam, fasting.    Donato Schultz, DO

## 2023-06-21 NOTE — Assessment & Plan Note (Signed)
Encourage heart healthy diet such as MIND or DASH diet, increase exercise, avoid trans fats, simple carbohydrates and processed foods, consider a krill or fish or flaxseed oil cap daily.  °

## 2023-06-21 NOTE — Assessment & Plan Note (Signed)
 Well controlled, no changes to meds. Encouraged heart healthy diet such as the DASH diet and exercise as tolerated.  °

## 2023-06-22 LAB — HEMOGLOBIN A1C: Hgb A1c MFr Bld: 5.8 % (ref 4.6–6.5)

## 2023-06-22 LAB — LIPID PANEL
Cholesterol: 196 mg/dL (ref 0–200)
HDL: 51.3 mg/dL (ref >39.00)
NonHDL: 145.01
Total CHOL/HDL Ratio: 4
Triglycerides: 207 mg/dL — ABNORMAL HIGH (ref 0.0–149.0)
VLDL: 41.4 mg/dL — ABNORMAL HIGH (ref 0.0–40.0)

## 2023-06-22 LAB — COMPREHENSIVE METABOLIC PANEL
ALT: 17 U/L (ref 0–35)
AST: 18 U/L (ref 0–37)
Albumin: 4.4 g/dL (ref 3.5–5.2)
Alkaline Phosphatase: 91 U/L (ref 39–117)
BUN: 12 mg/dL (ref 6–23)
CO2: 23 mEq/L (ref 19–32)
Calcium: 9.4 mg/dL (ref 8.4–10.5)
Chloride: 108 mEq/L (ref 96–112)
Creatinine, Ser: 0.94 mg/dL (ref 0.40–1.20)
GFR: 61.82 mL/min (ref >60.00)
Glucose, Bld: 118 mg/dL — ABNORMAL HIGH (ref 70–99)
Potassium: 3.8 mEq/L (ref 3.5–5.1)
Sodium: 139 mEq/L (ref 135–145)
Total Bilirubin: 0.4 mg/dL (ref 0.2–1.2)
Total Protein: 6.8 g/dL (ref 6.0–8.3)

## 2023-06-22 LAB — CBC WITH DIFFERENTIAL/PLATELET
Basophils Absolute: 0.1 10*3/uL (ref 0.0–0.1)
Basophils Relative: 0.9 % (ref 0.0–3.0)
Eosinophils Absolute: 0.2 10*3/uL (ref 0.0–0.7)
Eosinophils Relative: 2.5 % (ref 0.0–5.0)
HCT: 42.2 % (ref 36.0–46.0)
Hemoglobin: 13.5 g/dL (ref 12.0–15.0)
Lymphocytes Relative: 23.5 % (ref 12.0–46.0)
Lymphs Abs: 1.5 10*3/uL (ref 0.7–4.0)
MCHC: 32 g/dL (ref 30.0–36.0)
MCV: 92 fl (ref 78.0–100.0)
Monocytes Absolute: 0.5 10*3/uL (ref 0.1–1.0)
Monocytes Relative: 8.1 % (ref 3.0–12.0)
Neutro Abs: 4.3 10*3/uL (ref 1.4–7.7)
Neutrophils Relative %: 65 % (ref 43.0–77.0)
Platelets: 71 10*3/uL — ABNORMAL LOW (ref 150.0–400.0)
RBC: 4.58 Mil/uL (ref 3.87–5.11)
RDW: 13.4 % (ref 11.5–15.5)
WBC: 6.5 10*3/uL (ref 4.0–10.5)

## 2023-06-22 LAB — TSH: TSH: 3.15 u[IU]/mL (ref 0.35–5.50)

## 2023-06-22 LAB — VITAMIN B12: Vitamin B-12: 557 pg/mL (ref 211–911)

## 2023-06-22 LAB — LDL CHOLESTEROL, DIRECT: Direct LDL: 105 mg/dL

## 2023-06-22 LAB — VITAMIN D 25 HYDROXY (VIT D DEFICIENCY, FRACTURES): VITD: 29.74 ng/mL — ABNORMAL LOW (ref 30.00–100.00)

## 2023-06-23 ENCOUNTER — Other Ambulatory Visit: Payer: Self-pay

## 2023-06-23 MED ORDER — VITAMIN D (ERGOCALCIFEROL) 1.25 MG (50000 UNIT) PO CAPS: 50000.0000 [IU] | ORAL_CAPSULE | ORAL | 1 refills | Status: DC

## 2023-07-20 ENCOUNTER — Inpatient Hospital Stay: Payer: Medicare HMO | Attending: Family

## 2023-07-20 VITALS — BP 121/69 | HR 80 | Temp 97.7°F | Resp 18

## 2023-07-20 DIAGNOSIS — D6959 Other secondary thrombocytopenia: Secondary | ICD-10-CM | POA: Diagnosis not present

## 2023-07-20 DIAGNOSIS — E538 Deficiency of other specified B group vitamins: Secondary | ICD-10-CM | POA: Diagnosis not present

## 2023-07-20 MED ORDER — CYANOCOBALAMIN 1000 MCG/ML IJ SOLN
1000.0000 ug | Freq: Once | INTRAMUSCULAR | Status: AC
  Administered 2023-07-20: 1000 ug via INTRAMUSCULAR
  Filled 2023-07-20: qty 1

## 2023-07-20 NOTE — Patient Instructions (Signed)
Vitamin B12 Deficiency Vitamin B12 deficiency occurs when the body does not have enough of this important vitamin. The body needs this vitamin: To make red blood cells. To make DNA. This is the genetic material inside cells. To help the nerves work properly so they can carry messages from the brain to the body. Vitamin B12 deficiency can cause health problems, such as not having enough red blood cells in the blood (anemia). This can lead to nerve damage if untreated. What are the causes? This condition may be caused by: Not eating enough foods that contain vitamin B12. Not having enough stomach acid and digestive fluids to properly absorb vitamin B12 from the food that you eat. Having certain diseases that make it hard to absorb vitamin B12. These diseases include Crohn's disease, chronic pancreatitis, and cystic fibrosis. An autoimmune disorder in which the body does not make enough of a protein (intrinsic factor) within the stomach, resulting in not enough absorption of vitamin B12. Having a surgery in which part of the stomach or small intestine is removed. Taking certain medicines that make it hard for the body to absorb vitamin B12. These include: Heartburn medicines, such as antacids and proton pump inhibitors. Some medicines that are used to treat diabetes. What increases the risk? The following factors may make you more likely to develop a vitamin B12 deficiency: Being an older adult. Eating a vegetarian or vegan diet that does not include any foods that come from animals. Eating a poor diet while you are pregnant. Taking certain medicines. Having alcoholism. What are the signs or symptoms? In some cases, there are no symptoms of this condition. If the condition leads to anemia or nerve damage, various symptoms may occur, such as: Weakness. Tiredness (fatigue). Loss of appetite. Numbness or tingling in your hands and feet. Redness and burning of the tongue. Depression,  confusion, or memory problems. Trouble walking. If anemia is severe, symptoms can include: Shortness of breath. Dizziness. Rapid heart rate. How is this diagnosed? This condition may be diagnosed with a blood test to measure the level of vitamin B12 in your blood. You may also have other tests, including: A group of tests that measure certain characteristics of blood cells (complete blood count, CBC). A blood test to measure intrinsic factor. A procedure where a thin tube with a camera on the end is used to look into your stomach or intestines (endoscopy). Other tests may be needed to discover the cause of the deficiency. How is this treated? Treatment for this condition depends on the cause. This condition may be treated by: Changing your eating and drinking habits, such as: Eating more foods that contain vitamin B12. Drinking less alcohol or no alcohol. Getting vitamin B12 injections. Taking vitamin B12 supplements by mouth (orally). Your health care provider will tell you which dose is best for you. Follow these instructions at home: Eating and drinking  Include foods in your diet that come from animals and contain a lot of vitamin B12. These include: Meats and poultry. This includes beef, pork, chicken, turkey, and organ meats, such as liver. Seafood. This includes clams, rainbow trout, salmon, tuna, and haddock. Eggs. Dairy foods such as milk, yogurt, and cheese. Eat foods that have vitamin B12 added to them (are fortified), such as ready-to-eat breakfast cereals. Check the label on the package to see if a food is fortified. The items listed above may not be a complete list of foods and beverages you can eat and drink. Contact a dietitian for   more information. Alcohol use Do not drink alcohol if: Your health care provider tells you not to drink. You are pregnant, may be pregnant, or are planning to become pregnant. If you drink alcohol: Limit how much you have to: 0-1 drink a  day for women. 0-2 drinks a day for men. Know how much alcohol is in your drink. In the [place] one drink equals one 12 oz bottle of beer (355 mL), one 5 oz glass of wine (148 mL), or one 1 oz glass of hard liquor (44 mL). General instructions Get vitamin B12 injections if told to by your health care provider. Take supplements only as told by your health care provider. Follow the directions carefully. Keep all follow-up visits. This is important. Contact a health care provider if: Your symptoms come back. Your symptoms get worse or do not improve with treatment. Get help right away: You develop shortness of breath. You have a rapid heart rate. You have chest pain. You become dizzy or you faint. These symptoms may be an emergency. Get help right away. Call 911. Do not wait to see if the symptoms will go away. Do not drive yourself to the hospital. Summary Vitamin B12 deficiency occurs when the body does not have enough of this important vitamin. Common causes include not eating enough foods that contain vitamin B12, not being able to absorb vitamin B12 from the food that you eat, having a surgery in which part of the stomach or small intestine is removed, or taking certain medicines. Eat foods that have vitamin B12 in them. Treatment may include making a change in the way you eat and drink, getting vitamin B12 injections, or taking vitamin B12 supplements. This information is not intended to replace advice given to you by your health care provider. Make sure you discuss any questions you have with your health care provider. Document Revised: 07/18/2021 Document Reviewed: 07/18/2021 Elsevier Patient Education  2024 [employer]  

## 2023-07-27 ENCOUNTER — Other Ambulatory Visit: Payer: Self-pay | Admitting: Family Medicine

## 2023-07-27 DIAGNOSIS — F419 Anxiety disorder, unspecified: Secondary | ICD-10-CM

## 2023-07-28 NOTE — Telephone Encounter (Signed)
Requesting: Xanax 1mg  Contract: N/A UDS: 05/05/2022 Last Visit: 06/21/2023 Next Visit: N/A Last Refill: 06/17/2023  Please Advise

## 2023-08-02 ENCOUNTER — Other Ambulatory Visit: Payer: Self-pay

## 2023-08-02 ENCOUNTER — Inpatient Hospital Stay: Payer: Medicare HMO

## 2023-08-02 ENCOUNTER — Encounter: Payer: Self-pay | Admitting: Family

## 2023-08-02 ENCOUNTER — Inpatient Hospital Stay: Payer: Medicare HMO | Admitting: Family

## 2023-08-02 VITALS — BP 101/84 | HR 81 | Temp 98.1°F | Resp 18 | Ht 62.0 in | Wt 178.0 lb

## 2023-08-02 DIAGNOSIS — D696 Thrombocytopenia, unspecified: Secondary | ICD-10-CM

## 2023-08-02 DIAGNOSIS — E538 Deficiency of other specified B group vitamins: Secondary | ICD-10-CM

## 2023-08-02 DIAGNOSIS — D6959 Other secondary thrombocytopenia: Secondary | ICD-10-CM | POA: Diagnosis not present

## 2023-08-02 LAB — CMP (CANCER CENTER ONLY)
ALT: 15 U/L (ref 0–44)
AST: 15 U/L (ref 15–41)
Albumin: 4.5 g/dL (ref 3.5–5.0)
Alkaline Phosphatase: 104 U/L (ref 38–126)
Anion gap: 7 (ref 5–15)
BUN: 13 mg/dL (ref 8–23)
CO2: 27 mmol/L (ref 22–32)
Calcium: 9.7 mg/dL (ref 8.9–10.3)
Chloride: 106 mmol/L (ref 98–111)
Creatinine: 1.05 mg/dL — ABNORMAL HIGH (ref 0.44–1.00)
GFR, Estimated: 58 mL/min — ABNORMAL LOW (ref >60)
Glucose, Bld: 158 mg/dL — ABNORMAL HIGH (ref 70–99)
Potassium: 4.5 mmol/L (ref 3.5–5.1)
Sodium: 140 mmol/L (ref 135–145)
Total Bilirubin: 0.3 mg/dL (ref 0.3–1.2)
Total Protein: 7.2 g/dL (ref 6.5–8.1)

## 2023-08-02 LAB — CBC WITH DIFFERENTIAL (CANCER CENTER ONLY)
Abs Immature Granulocytes: 0.03 10*3/uL (ref 0.00–0.07)
Basophils Absolute: 0.1 10*3/uL (ref 0.0–0.1)
Basophils Relative: 1 %
Eosinophils Absolute: 0.4 10*3/uL (ref 0.0–0.5)
Eosinophils Relative: 6 %
HCT: 45.4 % (ref 36.0–46.0)
Hemoglobin: 14.2 g/dL (ref 12.0–15.0)
Immature Granulocytes: 1 %
Lymphocytes Relative: 26 %
Lymphs Abs: 1.7 10*3/uL (ref 0.7–4.0)
MCH: 29.2 pg (ref 26.0–34.0)
MCHC: 31.3 g/dL (ref 30.0–36.0)
MCV: 93.4 fL (ref 80.0–100.0)
Monocytes Absolute: 0.5 10*3/uL (ref 0.1–1.0)
Monocytes Relative: 8 %
Neutro Abs: 3.7 10*3/uL (ref 1.7–7.7)
Neutrophils Relative %: 58 %
Platelet Count: 67 10*3/uL — ABNORMAL LOW (ref 150–400)
RBC: 4.86 MIL/uL (ref 3.87–5.11)
RDW: 12.6 % (ref 11.5–15.5)
WBC Count: 6.4 10*3/uL (ref 4.0–10.5)
nRBC: 0 % (ref 0.0–0.2)

## 2023-08-02 LAB — VITAMIN B12: Vitamin B-12: 523 pg/mL (ref 180–914)

## 2023-08-02 NOTE — Progress Notes (Signed)
Hematology and Oncology Follow Up Visit  Ebony Garcia [id number] 08-May-1951 72 y.o. 08/02/2023   Principle Diagnosis:  Medication induced thrombocytopenia B 12 deficiency    Current Therapy:        Observation B 12 injection Q 6 weeks   Interim History:  Ebony Garcia is here today for follow-up and B 12 injection. She is doing well but has chronic fatigue. She takes breaks to rest when needed.  B 12 level in July was 557.  Platelets are stable at 67.  No blood loss, abnormal bruising or petechiae. Noted.  No fever, chills, n/v, cough, rash, dizziness, SOB, chest pain, palpitations, abdominal pain or changes in bowel or bladder habits.  No swelling, tenderness, numbness or tingling in her extremities at this time.  No falls or syncope.  Appetite and hydration are good. Weight is 178 lbs.   ECOG Performance Status: 1 - Symptomatic but completely ambulatory  Medications:  Allergies as of 08/02/2023       Reactions   Amoxicillin-pot Clavulanate Diarrhea, Other (See Comments)   Antihistamines, Diphenhydramine-type Other (See Comments)   Other Reaction: makes pt nervous   Diphenhydramine Hcl Other (See Comments)   Other Reaction: makes pt nervous   Fremanezumab-vfrm Rash, Other (See Comments)   Seizure, jerking   Other Other (See Comments)   Tramadol Hcl Other (See Comments)   Clindamycin Other (See Comments)   She has not taken but per patient  "her mother had a reaction and she does not want it ever"   Hydrocodone-acetaminophen Other (See Comments)   REACTION: sensitive to  but can take for extreme pain per patient   Lisinopril    Restless , irregular sleep   Nortriptyline Other (See Comments)   UNKNOWN REACTION   Propoxyphene N-acetaminophen Nausea Only   Sulfa Antibiotics Other (See Comments)   Unknown reaction   Tramadol Other (See Comments)   UNKNOWN REACTION        Medication List        Accurate as of August 02, 2023  3:14 PM. If you have any  questions, ask your nurse or doctor.          ALPRAZolam 1 MG tablet Commonly known as: XANAX TAKE 1 TABLET BY MOUTH EVERY DAY AT BEDTIME AS NEEDED FOR SLEEP   amLODipine 5 MG tablet Commonly known as: NORVASC TAKE 1 TABLET EVERY DAY   aspirin EC 81 MG tablet Take 1 tablet (81 mg total) by mouth daily. Swallow whole.   FLUoxetine 20 MG capsule Commonly known as: PROZAC TAKE 1 CAPSULE EVERY DAY   furosemide 20 MG tablet Commonly known as: LASIX TAKE 1 TABLET EVERY DAY   metoCLOPramide 10 MG tablet Commonly known as: Reglan Take 1 tablet (10 mg total) by mouth every 6 (six) hours as needed for nausea.   triamcinolone ointment 0.1 % Commonly known as: KENALOG Apply topically as needed   Vitamin D (Ergocalciferol) 1.25 MG (50000 UNIT) Caps capsule Commonly known as: DRISDOL Take 1 capsule (50,000 Units total) by mouth every 7 (seven) days.   Vyepti 100 MG/ML injection Generic drug: Eptinezumab-jjmr Infusion q12 weeks        Allergies:  Allergies  Allergen Reactions   Amoxicillin-Pot Clavulanate Diarrhea and Other (See Comments)   Antihistamines, Diphenhydramine-Type Other (See Comments)    Other Reaction: makes pt nervous   Diphenhydramine Hcl Other (See Comments)    Other Reaction: makes pt nervous   Fremanezumab-Vfrm Rash and Other (See Comments)    Seizure,  jerking   Other Other (See Comments)   Tramadol Hcl Other (See Comments)   Clindamycin Other (See Comments)    She has not taken but per patient  "her mother had a reaction and she does not want it ever"   Hydrocodone-Acetaminophen Other (See Comments)    REACTION: sensitive to  but can take for extreme pain per patient   Lisinopril     Restless , irregular sleep   Nortriptyline Other (See Comments)    UNKNOWN REACTION   Propoxyphene N-Acetaminophen Nausea Only   Sulfa Antibiotics Other (See Comments)    Unknown reaction   Tramadol Other (See Comments)    UNKNOWN REACTION    Past Medical  History, Surgical history, Social history, and Family History were reviewed and updated.  Review of Systems: All other 10 point review of systems is negative.   Physical Exam:  vitals were not taken for this visit.   Wt Readings from Last 3 Encounters:  06/21/23 179 lb (81.2 kg)  06/16/23 177 lb 3.2 oz (80.4 kg)  03/17/23 176 lb 9.6 oz (80.1 kg)    Ocular: Sclerae unicteric, pupils equal, round and reactive to light Ear-nose-throat: Oropharynx clear, dentition fair Lymphatic: No cervical or supraclavicular adenopathy Lungs no rales or rhonchi, good excursion bilaterally Heart regular rate and rhythm, no murmur appreciated Abd soft, nontender, positive bowel sounds MSK no focal spinal tenderness, no joint edema Neuro: non-focal, well-oriented, appropriate affect Breasts: Deferred   Lab Results  Component Value Date   WBC 6.4 08/02/2023   HGB 14.2 08/02/2023   HCT 45.4 08/02/2023   MCV 93.4 08/02/2023   PLT 67 (L) 08/02/2023   No results found for: "FERRITIN", "IRON", "TIBC", "UIBC", "IRONPCTSAT" Lab Results  Component Value Date   RBC 4.86 08/02/2023   No results found for: "KPAFRELGTCHN", "LAMBDASER", "KAPLAMBRATIO" No results found for: "IGGSERUM", "IGA", "IGMSERUM" No results found for: "TOTALPROTELP", "ALBUMINELP", "A1GS", "A2GS", "BETS", "BETA2SER", "GAMS", "MSPIKE", "SPEI"   Chemistry      Component Value Date/Time   NA 139 06/21/2023 1447   NA 143 11/11/2017 1339   K 3.8 06/21/2023 1447   K 3.6 11/11/2017 1339   CL 108 06/21/2023 1447   CL 109 (H) 11/11/2017 1339   CO2 23 06/21/2023 1447   CO2 24 11/11/2017 1339   BUN 12 06/21/2023 1447   BUN 9 11/11/2017 1339   CREATININE 0.94 06/21/2023 1447   CREATININE 1.12 (H) 02/01/2023 1424   CREATININE 1.00 (H) 10/18/2020 1433      Component Value Date/Time   CALCIUM 9.4 06/21/2023 1447   CALCIUM 9.3 11/11/2017 1339   ALKPHOS 91 06/21/2023 1447   ALKPHOS 85 (H) 11/11/2017 1339   AST 18 06/21/2023 1447   AST  15 02/01/2023 1424   ALT 17 06/21/2023 1447   ALT 12 02/01/2023 1424   ALT 24 11/11/2017 1339   BILITOT 0.4 06/21/2023 1447   BILITOT 0.4 02/01/2023 1424       Impression and Plan: Ebony Garcia is a pleasant 73 yo caucasian female with thrombocytopenia and B12 deficiency.  B 12 injection given earlier this month.  B 12 injection every 6 weeks.  Follow-up in 6 months.   Eileen Stanford, NP 8/26/20243:14 PM

## 2023-08-06 NOTE — Progress Notes (Unsigned)
NEUROLOGY FOLLOW UP OFFICE NOTE  Ebony Garcia [id number]  Assessment/Plan:   Migraine without aura, without status migrainosus, not intractable - doing well on Vyepti       Migraine prevention:  Vyepti 100mg  every 3 months; decrease topiramate to 50mg  at bedtime.  If doing well  next visit, try discontinuing it. Limit use of pain relievers to no more than 2 days out of week to prevent risk of rebound or medication-overuse headache. Keep headache diary Follow up 6 months.     Subjective:  Ebony Garcia is a 72 year old right-handed woman with hypertension, CHF, B12 deficiency, depression and hyperlipidemia who follows up for migraines.   UPDATE ***    Current NSAIDS:  ASA 81mg  Current analgesics:  no. Current triptans:   none Current anti-emetic:  no Current muscle relaxants:  no Current anti-anxiolytic:  Xanax 1mg  Current sleep aide:  Xanax 1mg  Current Antihypertensive medications:  Lasix Current Antidepressant medications:  Prozac 20mg  Current Anticonvulsant medications:  topiramate 50mg  BID Current anti-CGRP:  Vyepti 100mg     Depression and anxiety:  stable She reports off and on body aches, left hip pain Reports drinking 64 oz water daily    HISTORY: Onset:  Since her late 30s. Location: usually left sided, periorbital, and back of head.  More recently, holocephalic Quality:  Sharp, aching pressure.  Non-throbbing Initial intensity:  7/10; Sept: 7/10 Associated symptoms:  Nausea, photophobia and phonophobia.  No visual disturbance, osmophobia, vomiting or unilateral numbness or weakness Aura:  no Initial duration:  3 days; Sept: 30 minutes with Maxalt Initial frequency:  25 headache days per month Triggers:  Emotional stress Relieving factors:  Cold mask, heating pack on neck and shoulders   Past NSAIDs:  naproxen (ineffective), indomethacin, toradol tablet (ineffective), Toradol 60mg  IM (effective), Cambia (side effects) Past analgesics:  Tylenol  (ineffective), Excedrin (ineffective), Lidocaine nasal drops (effective but caused hives), tramadol (side effects), Midrin (effective but made her sleepy) Past triptans/ergots:  sumatriptan 100mg , Zomig 5mg  (effective), Frova, Relpax, DHE (effective), Maxalt (effective but unable to afford it), Tosymra NS, Zomig 5mg  NS Past antiemetic:  Promethazine, Zofran, Reglan Past anxiolytics:  Vicodin Past muscle relaxants:  cyclobenzaprine Past antihypertensives:  propranolol (ineffective), atenolol (ineffective), lisinopril, metoprolol, HCTZ, losartan Past antidepressants:  amitriptyline (side effects), venlafaxine (ineffective), nortriptyline (effective but side effects such as rash), imipramine Past antiepileptics:  Depakote (hair loss), lamotrigine (side effects), zonisamide (side effects), gabapentin, Keppra Past CGRP inhibitor:  Aimovig (effective but concerned about side effects- ankle jerking, abnormal skin pattern), Ajovy (same side effects); Emgality (cost) Past vitamins and supplements:  magnesium (ineffective), feverfew (ineffective) Other past therapy:  Botox (one round, flu like symptoms, unable to afford 20% copay), occipital nerve blocks, trigger point injections, alternating heat/ice   Family history of headache:  Mother, grandmother, great-aunt, cousins.   08/16/08 MRI Brain w/wo performed for "explosive headaches": nonspecific punctate hyperintensities in the subcortical white matter. 08/16/08 MRA Head: Unremarkable.  There is mild attenuation in the vertebrobasilar junction and proximal basilar artery which is likely artifact. 07/05/19 CTA of Head:  Unremarkable for mass lesion, high-grade arterial stenosis, aneurysm or other acute intracranial abnormality.  PAST MEDICAL HISTORY: Past Medical History:  Diagnosis Date   Acute diastolic CHF (congestive heart failure) (HCC) 04/30/2013   5/24-27/14 acute diastolic heart failure  BNP 1528 2 D echocardiogram essentially negative  Chest x-ray  revealed cardiomegaly with costophrenic angle blunting and suggestion of possible interstitial vascular accentuation; no frank heart failure present. 10 pound weight loss with 20 mg  of furosemide daily  No definite etiology of the cardiomegaly or acute diastolic failure    Anxiety 02/07/2015   Auditory hallucination 10/06/2017   B12 deficiency 10/07/2018   Bradycardia 04/30/2013   Cancer (HCC)    Chest pain 05/14/2020   Chest pain with moderate risk of acute coronary syndrome 02/07/2015   CHF (congestive heart failure) (HCC)    dystolic heart dysfunction   Chronic kidney disease    De Quervain's tenosynovitis, left 05/23/2013   Depression    Depression with anxiety    Depressive disorder, not elsewhere classified 05/31/2007   Centricity Description: DISORDER, DEPRESSIVE NEC Qualifier: Diagnosis of  By: Alwyn Ren MD, Bernardo Heater, [facility]    Diabetes mellitus without complication [facility])    Diet controlled   Dysmetabolic syndrome X 02/27/2008   Qualifier: Diagnosis of  By: Alwyn Ren MD, William  A1c 6.3% in 2/12; TG 278    Essential hypertension 08/09/2008   Qualifier: Diagnosis of  By: Alwyn Ren MD, William     Estrogen deficiency 02/02/2019   Estrogen deficiency 02/02/2019   Fatigue 02/02/2019   Hair loss 02/02/2019   Hyperlipidemia 02/27/2008   Qualifier: Diagnosis of  By: Alwyn Ren MD, William     Hypertension    Intertrigo    INTERTRIGO 11/08/2009   Qualifier: Diagnosis of  By: Peggyann Juba FNP, Melissa S    Intractable migraine with aura without status migrainosus 08/03/2018   Migraine variant 08/15/2008   Qualifier: Diagnosis of  By: Alwyn Ren MD, William   Onset:initially @ age 89;recurrence age 86 during 1st trimester. Recurrence @ 39. No prodrome or aura Previously on Tryptans & Topamax with temporary relief but tolerance appeared Botox by Dr Norval Gable 09/2010  most effective     Migraines    Myalgia 02/02/2019   NONSPECIFIC ABNORMAL ELECTROCARDIOGRAM 08/15/2008    Qualifier: Diagnosis of  By: Alwyn Ren MD, William     Palpitation 04/17/2014   Panic attacks    Panic attacks    Polydipsia 11/02/2007   Qualifier: Diagnosis of  By: Alwyn Ren MD, William     RENAL CALCULUS 02/21/2007   Qualifier: Diagnosis of  By: Daine Gip     Squamous cell carcinoma in situ of skin 12/26/2015   Thrombocytopenia (HCC) 10/06/2017   Viral syndrome 04/10/2019   WEIGHT GAIN 11/02/2007   Qualifier: Diagnosis of  By: Alwyn Ren MD, Chrissie Noa      MEDICATIONS: Current Outpatient Medications on File Prior to Visit  Medication Sig Dispense Refill   ALPRAZolam (XANAX) 1 MG tablet TAKE 1 TABLET BY MOUTH EVERY DAY AT BEDTIME AS NEEDED FOR SLEEP 30 tablet 0   amLODipine (NORVASC) 5 MG tablet TAKE 1 TABLET EVERY DAY 90 tablet 1   aspirin EC 81 MG tablet Take 1 tablet (81 mg total) by mouth daily. Swallow whole. 30 tablet 11   Eptinezumab-jjmr (VYEPTI) 100 MG/ML injection Infusion q12 weeks 1.12 mL 0   FLUoxetine (PROZAC) 20 MG capsule TAKE 1 CAPSULE EVERY DAY 90 capsule 3   furosemide (LASIX) 20 MG tablet TAKE 1 TABLET EVERY DAY 90 tablet 3   metoCLOPramide (REGLAN) 10 MG tablet Take 1 tablet (10 mg total) by mouth every 6 (six) hours as needed for nausea. 20 tablet 5   triamcinolone ointment (KENALOG) 0.1 % Apply topically as needed     Vitamin D, Ergocalciferol, (DRISDOL) 1.25 MG (50000 UNIT) CAPS capsule Take 1 capsule (50,000 Units total) by mouth every 7 (seven) days. 12 capsule 1   No current facility-administered medications  on file prior to visit.     ALLERGIES: Allergies  Allergen Reactions   Amoxicillin-Pot Clavulanate Diarrhea and Other (See Comments)   Antihistamines, Diphenhydramine-Type Other (See Comments)    Other Reaction: makes pt nervous   Diphenhydramine Hcl Other (See Comments)    Other Reaction: makes pt nervous   Fremanezumab-Vfrm Rash and Other (See Comments)    Seizure, jerking   Clindamycin Other (See Comments)    She has not taken but per patient  "her  mother had a reaction and she does not want it ever"   Hydrocodone-Acetaminophen Other (See Comments)    REACTION: sensitive to  but can take for extreme pain per patient   Lisinopril     Restless , irregular sleep   Nortriptyline Other (See Comments)    UNKNOWN REACTION   Other Other (See Comments)   Propoxyphene N-Acetaminophen Nausea Only   Sulfa Antibiotics Other (See Comments)    Unknown reaction   Tramadol Other (See Comments)    UNKNOWN REACTION   Tramadol Hcl Other (See Comments)    FAMILY HISTORY: Family History  Problem Relation Age of Onset   Crohn's disease Mother    Atrial fibrillation Mother    Heart failure Mother    Bladder Cancer Father    Cancer Father        bladder   Bipolar disorder Brother    Cancer Brother    Bipolar disorder Sister    Heart attack Maternal Grandmother        >65   Cancer Maternal Aunt    Colon cancer Maternal Uncle    Cancer Paternal Grandfather    Cancer Maternal Aunt    Cancer Cousin    Anemia Cousin    Diabetes Neg Hx    Stroke Neg Hx       Objective:  *** General: No acute distress.  Patient appears well-groomed.   Head:  Normocephalic/atraumatic Eyes:  Fundi examined but not visualized Heart:  Regular rate and rhythm Neurological Exam: ***   Shon Millet, DO  CC: Seabron Spates, DO

## 2023-08-10 ENCOUNTER — Encounter: Payer: Self-pay | Admitting: Neurology

## 2023-08-10 ENCOUNTER — Ambulatory Visit: Payer: Medicare HMO | Admitting: Neurology

## 2023-08-10 VITALS — BP 119/70 | HR 88 | Ht 62.0 in | Wt 178.0 lb

## 2023-08-10 DIAGNOSIS — G43009 Migraine without aura, not intractable, without status migrainosus: Secondary | ICD-10-CM | POA: Diagnosis not present

## 2023-08-10 DIAGNOSIS — H9319 Tinnitus, unspecified ear: Secondary | ICD-10-CM | POA: Diagnosis not present

## 2023-08-10 MED ORDER — TOPIRAMATE 25 MG PO TABS: 25.0000 mg | ORAL_TABLET | Freq: Every day | ORAL | 6 refills | Status: DC

## 2023-08-10 NOTE — Patient Instructions (Signed)
Continue Vyepti  Decrease topiramate to 25mg  at bedtime.  You may try discontinuing it in 3 months.  Otherwise, continue till I see you next time.  Follow up 6-7 months

## 2023-08-31 ENCOUNTER — Other Ambulatory Visit: Payer: Self-pay | Admitting: Family Medicine

## 2023-08-31 DIAGNOSIS — F419 Anxiety disorder, unspecified: Secondary | ICD-10-CM

## 2023-09-01 NOTE — Telephone Encounter (Signed)
Requesting: Xanax 1mg  Contract: 05/05/2022 UDS: 05/05/2022 Last Visit: 06/21/2023 Next Visit: N/A Last Refill: 07/28/2023  Please Advise

## 2023-09-10 ENCOUNTER — Other Ambulatory Visit: Payer: Self-pay | Admitting: Family Medicine

## 2023-09-10 DIAGNOSIS — Z1212 Encounter for screening for malignant neoplasm of rectum: Secondary | ICD-10-CM

## 2023-09-10 DIAGNOSIS — Z1211 Encounter for screening for malignant neoplasm of colon: Secondary | ICD-10-CM

## 2023-09-16 ENCOUNTER — Ambulatory Visit: Payer: Medicare HMO

## 2023-09-16 VITALS — BP 101/67 | HR 54 | Temp 97.9°F | Resp 16 | Ht 62.0 in | Wt 177.2 lb

## 2023-09-16 DIAGNOSIS — G43119 Migraine with aura, intractable, without status migrainosus: Secondary | ICD-10-CM | POA: Diagnosis not present

## 2023-09-16 MED ORDER — SODIUM CHLORIDE 0.9 % IV SOLN
100.0000 mg | Freq: Once | INTRAVENOUS | Status: AC
  Administered 2023-09-16: 100 mg via INTRAVENOUS
  Filled 2023-09-16: qty 1

## 2023-09-16 NOTE — Progress Notes (Signed)
Diagnosis: Migraine  Provider:  Chilton Greathouse MD  Procedure: IV Infusion  IV Type: Peripheral, IV Location: R Antecubital  Vyepti (Eptinezumab-jjmr), Dose: 100 mg  Infusion Start Time: 1501  Infusion Stop Time: 1531  Post Infusion IV Care: Peripheral IV Discontinued  Discharge: Condition: Good, Destination: Home . AVS Declined  Performed by:  Adriana Mccallum, RN

## 2023-09-27 ENCOUNTER — Inpatient Hospital Stay: Payer: Medicare HMO | Attending: Family

## 2023-09-27 VITALS — BP 132/79 | HR 75 | Temp 98.8°F | Resp 18

## 2023-09-27 DIAGNOSIS — E538 Deficiency of other specified B group vitamins: Secondary | ICD-10-CM | POA: Diagnosis not present

## 2023-09-27 DIAGNOSIS — D6959 Other secondary thrombocytopenia: Secondary | ICD-10-CM | POA: Diagnosis not present

## 2023-09-27 MED ORDER — CYANOCOBALAMIN 1000 MCG/ML IJ SOLN
1000.0000 ug | Freq: Once | INTRAMUSCULAR | Status: AC
  Administered 2023-09-27: 1000 ug via INTRAMUSCULAR
  Filled 2023-09-27: qty 1

## 2023-09-27 NOTE — Patient Instructions (Signed)
 Vitamin B12 Injection What is this medication? Vitamin B12 (VAHY tuh min B12) prevents and treats low vitamin B12 levels in your body. It is used in people who do not get enough vitamin B12 from their diet or when their digestive tract does not absorb enough. Vitamin B12 plays an important role in maintaining the health of your nervous system and red blood cells. This medicine may be used for other purposes; ask your health care provider or pharmacist if you have questions. COMMON BRAND NAME(S): B-12 Compliance Kit, B-12 Injection Kit, Cyomin, Dodex, LA-12, Nutri-Twelve, Physicians EZ Use B-12, Primabalt, Vitamin Deficiency Injectable System - B12 What should I tell my care team before I take this medication? They need to know if you have any of these conditions: Kidney disease Leber's disease Megaloblastic anemia An unusual or allergic reaction to cyanocobalamin, cobalt, other medications, foods, dyes, or preservatives Pregnant or trying to get pregnant Breast-feeding How should I use this medication? This medication is injected into a muscle or deeply under the skin. It is usually given in a clinic or care team's office. However, your care team may teach you how to inject yourself. Follow all instructions. Talk to your care team about the use of this medication in children. Special care may be needed. Overdosage: If you think you have taken too much of this medicine contact a poison control center or emergency room at once. NOTE: This medicine is only for you. Do not share this medicine with others. What if I miss a dose? If you are given your dose at a clinic or care team's office, call to reschedule your appointment. If you give your own injections, and you miss a dose, take it as soon as you can. If it is almost time for your next dose, take only that dose. Do not take double or extra doses. What may interact with this medication? Alcohol Colchicine This list may not describe all possible  interactions. Give your health care provider a list of all the medicines, herbs, non-prescription drugs, or dietary supplements you use. Also tell them if you smoke, drink alcohol, or use illegal drugs. Some items may interact with your medicine. What should I watch for while using this medication? Visit your care team regularly. You may need blood work done while you are taking this medication. You may need to follow a special diet. Talk to your care team. Limit your alcohol intake and avoid smoking to get the best benefit. What side effects may I notice from receiving this medication? Side effects that you should report to your care team as soon as possible: Allergic reactions--skin rash, itching, hives, swelling of the face, lips, tongue, or throat Swelling of the ankles, hands, or feet Trouble breathing Side effects that usually do not require medical attention (report to your care team if they continue or are bothersome): Diarrhea This list may not describe all possible side effects. Call your doctor for medical advice about side effects. You may report side effects to FDA at 1-800-FDA-1088. Where should I keep my medication? Keep out of the reach of children. Store at room temperature between 15 and 30 degrees C (59 and 85 degrees F). Protect from light. Throw away any unused medication after the expiration date. NOTE: This sheet is a summary. It may not cover all possible information. If you have questions about this medicine, talk to your doctor, pharmacist, or health care provider.  2024 Elsevier/Gold Standard (2021-08-05 00:00:00)

## 2023-10-07 ENCOUNTER — Telehealth: Payer: Self-pay | Admitting: Pharmacy Technician

## 2023-10-07 NOTE — Telephone Encounter (Signed)
Vyepti renewal  Auth Submission: APPROVED Site of care: Site of care: [facility] Payer: [employer] Medication & CPT/J Code(s) submitted: Vyepti (Eptinezumab) [id number] Route of submission (phone, fax, portal): portal Phone # Fax # Auth type: Buy/Bill PB Units/visits requested: 100mg  q3 months Reference number: [id number] Approval from: 12/08/23 to 12/06/24

## 2023-10-11 ENCOUNTER — Telehealth: Payer: Self-pay

## 2023-10-11 NOTE — Telephone Encounter (Signed)
Auth Submission: APPROVED Site of care: Site of care: [facility] Payer: [employer] Medication & CPT/J Code(s) submitted: Vyepti (Eptinezumab) [id number] Route of submission (phone, fax, portal): auto renew Phone # Fax # Auth type: Buy/Bill PB Units/visits requested: 100mg  x 4 doses Reference number: [id number] Approval from: 12/08/23 to 12/06/24

## 2023-10-14 ENCOUNTER — Other Ambulatory Visit: Payer: Self-pay | Admitting: Family

## 2023-10-14 DIAGNOSIS — F419 Anxiety disorder, unspecified: Secondary | ICD-10-CM

## 2023-10-18 ENCOUNTER — Other Ambulatory Visit: Payer: Self-pay | Admitting: Family Medicine

## 2023-10-18 DIAGNOSIS — F419 Anxiety disorder, unspecified: Secondary | ICD-10-CM

## 2023-10-18 MED ORDER — ALPRAZOLAM 1 MG PO TABS
ORAL_TABLET | ORAL | 0 refills | Status: DC
Start: 2023-10-18 — End: 2023-11-22

## 2023-10-18 NOTE — Telephone Encounter (Signed)
Requesting: Xanax Contract: 04/2022 UDS: 04/2022 Last OV: 06/21/23 Next OV: n/a Last Refill: 09/01/23, #30--0 RF Database:   Please advise

## 2023-10-18 NOTE — Addendum Note (Signed)
Addended by: Roxanne Gates on: 10/18/2023 02:51 PM   Modules accepted: Orders

## 2023-10-18 NOTE — Telephone Encounter (Signed)
Patient called to follow up on her alprazolam refill. She is totally out of the medication. Refill was sent to [facility]. Please send to [facility] on [address].

## 2023-11-02 ENCOUNTER — Ambulatory Visit: Payer: Medicare HMO

## 2023-11-07 ENCOUNTER — Other Ambulatory Visit: Payer: Self-pay | Admitting: Family Medicine

## 2023-11-07 DIAGNOSIS — I10 Essential (primary) hypertension: Secondary | ICD-10-CM

## 2023-11-08 ENCOUNTER — Inpatient Hospital Stay: Payer: Medicare HMO | Attending: Family

## 2023-11-08 VITALS — BP 131/71 | HR 83 | Temp 97.9°F | Resp 20

## 2023-11-08 DIAGNOSIS — E538 Deficiency of other specified B group vitamins: Secondary | ICD-10-CM | POA: Diagnosis not present

## 2023-11-08 DIAGNOSIS — D6959 Other secondary thrombocytopenia: Secondary | ICD-10-CM | POA: Diagnosis not present

## 2023-11-08 MED ORDER — CYANOCOBALAMIN 1000 MCG/ML IJ SOLN
1000.0000 ug | Freq: Once | INTRAMUSCULAR | Status: AC
  Administered 2023-11-08: 1000 ug via INTRAMUSCULAR
  Filled 2023-11-08: qty 1

## 2023-11-08 NOTE — Patient Instructions (Signed)
 Vitamin B12 Injection What is this medication? Vitamin B12 (VAHY tuh min B12) prevents and treats low vitamin B12 levels in your body. It is used in people who do not get enough vitamin B12 from their diet or when their digestive tract does not absorb enough. Vitamin B12 plays an important role in maintaining the health of your nervous system and red blood cells. This medicine may be used for other purposes; ask your health care provider or pharmacist if you have questions. COMMON BRAND NAME(S): B-12 Compliance Kit, B-12 Injection Kit, Cyomin, Dodex, LA-12, Nutri-Twelve, Physicians EZ Use B-12, Primabalt, Vitamin Deficiency Injectable System - B12 What should I tell my care team before I take this medication? They need to know if you have any of these conditions: Kidney disease Leber's disease Megaloblastic anemia An unusual or allergic reaction to cyanocobalamin, cobalt, other medications, foods, dyes, or preservatives Pregnant or trying to get pregnant Breast-feeding How should I use this medication? This medication is injected into a muscle or deeply under the skin. It is usually given in a clinic or care team's office. However, your care team may teach you how to inject yourself. Follow all instructions. Talk to your care team about the use of this medication in children. Special care may be needed. Overdosage: If you think you have taken too much of this medicine contact a poison control center or emergency room at once. NOTE: This medicine is only for you. Do not share this medicine with others. What if I miss a dose? If you are given your dose at a clinic or care team's office, call to reschedule your appointment. If you give your own injections, and you miss a dose, take it as soon as you can. If it is almost time for your next dose, take only that dose. Do not take double or extra doses. What may interact with this medication? Alcohol Colchicine This list may not describe all possible  interactions. Give your health care provider a list of all the medicines, herbs, non-prescription drugs, or dietary supplements you use. Also tell them if you smoke, drink alcohol, or use illegal drugs. Some items may interact with your medicine. What should I watch for while using this medication? Visit your care team regularly. You may need blood work done while you are taking this medication. You may need to follow a special diet. Talk to your care team. Limit your alcohol intake and avoid smoking to get the best benefit. What side effects may I notice from receiving this medication? Side effects that you should report to your care team as soon as possible: Allergic reactions--skin rash, itching, hives, swelling of the face, lips, tongue, or throat Swelling of the ankles, hands, or feet Trouble breathing Side effects that usually do not require medical attention (report to your care team if they continue or are bothersome): Diarrhea This list may not describe all possible side effects. Call your doctor for medical advice about side effects. You may report side effects to FDA at 1-800-FDA-1088. Where should I keep my medication? Keep out of the reach of children. Store at room temperature between 15 and 30 degrees C (59 and 85 degrees F). Protect from light. Throw away any unused medication after the expiration date. NOTE: This sheet is a summary. It may not cover all possible information. If you have questions about this medicine, talk to your doctor, pharmacist, or health care provider.  2024 Elsevier/Gold Standard (2021-08-05 00:00:00)

## 2023-11-20 ENCOUNTER — Other Ambulatory Visit: Payer: Self-pay | Admitting: Family Medicine

## 2023-11-20 DIAGNOSIS — F419 Anxiety disorder, unspecified: Secondary | ICD-10-CM

## 2023-11-22 NOTE — Telephone Encounter (Signed)
Requesting: Xanax 1 mg Contract: N/A UDS: N/A Last Visit: 06/21/2023 Next Visit: N/A Last Refill: 10/18/2023  Please Advise

## 2023-12-08 ENCOUNTER — Other Ambulatory Visit: Payer: Self-pay | Admitting: Family Medicine

## 2023-12-17 ENCOUNTER — Telehealth: Payer: Self-pay

## 2023-12-17 ENCOUNTER — Ambulatory Visit: Payer: Medicare HMO

## 2023-12-20 ENCOUNTER — Inpatient Hospital Stay: Payer: Medicare HMO | Attending: Family

## 2023-12-20 VITALS — BP 114/80 | HR 86 | Temp 97.9°F | Resp 20

## 2023-12-20 DIAGNOSIS — E538 Deficiency of other specified B group vitamins: Secondary | ICD-10-CM | POA: Insufficient documentation

## 2023-12-20 DIAGNOSIS — D6959 Other secondary thrombocytopenia: Secondary | ICD-10-CM | POA: Diagnosis not present

## 2023-12-20 MED ORDER — CYANOCOBALAMIN 1000 MCG/ML IJ SOLN
1000.0000 ug | Freq: Once | INTRAMUSCULAR | Status: AC
  Administered 2023-12-20: 1000 ug via INTRAMUSCULAR
  Filled 2023-12-20: qty 1

## 2023-12-20 NOTE — Patient Instructions (Signed)
 Vitamin B12 Injection What is this medication? Vitamin B12 (VAHY tuh min B12) prevents and treats low vitamin B12 levels in your body. It is used in people who do not get enough vitamin B12 from their diet or when their digestive tract does not absorb enough. Vitamin B12 plays an important role in maintaining the health of your nervous system and red blood cells. This medicine may be used for other purposes; ask your health care provider or pharmacist if you have questions. COMMON BRAND NAME(S): B-12 Compliance Kit, B-12 Injection Kit, Cyomin, Dodex , LA-12, Nutri-Twelve, Physicians EZ Use B-12, Primabalt, Vitamin Deficiency Injectable System - B12 What should I tell my care team before I take this medication? They need to know if you have any of these conditions: Kidney disease Leber's disease Megaloblastic anemia An unusual or allergic reaction to cyanocobalamin , cobalt, other medications, foods, dyes, or preservatives Pregnant or trying to get pregnant Breast-feeding How should I use this medication? This medication is injected into a muscle or deeply under the skin. It is usually given in a clinic or care team's office. However, your care team may teach you how to inject yourself. Follow all instructions. Talk to your care team about the use of this medication in children. Special care may be needed. Overdosage: If you think you have taken too much of this medicine contact a poison control center or emergency room at once. NOTE: This medicine is only for you. Do not share this medicine with others. What if I miss a dose? If you are given your dose at a clinic or care team's office, call to reschedule your appointment. If you give your own injections, and you miss a dose, take it as soon as you can. If it is almost time for your next dose, take only that dose. Do not take double or extra doses. What may interact with this medication? Alcohol Colchicine This list may not describe all possible  interactions. Give your health care provider a list of all the medicines, herbs, non-prescription drugs, or dietary supplements you use. Also tell them if you smoke, drink alcohol, or use illegal drugs. Some items may interact with your medicine. What should I watch for while using this medication? Visit your care team regularly. You may need blood work done while you are taking this medication. You may need to follow a special diet. Talk to your care team. Limit your alcohol intake and avoid smoking to get the best benefit. What side effects may I notice from receiving this medication? Side effects that you should report to your care team as soon as possible: Allergic reactions--skin rash, itching, hives, swelling of the face, lips, tongue, or throat Swelling of the ankles, hands, or feet Trouble breathing Side effects that usually do not require medical attention (report to your care team if they continue or are bothersome): Diarrhea This list may not describe all possible side effects. Call your doctor for medical advice about side effects. You may report side effects to FDA at 1-800-FDA-1088. Where should I keep my medication? Keep out of the reach of children. Store at room temperature between 15 and 30 degrees C (59 and 85 degrees F). Protect from light. Throw away any unused medication after the expiration date. NOTE: This sheet is a summary. It may not cover all possible information. If you have questions about this medicine, talk to your doctor, pharmacist, or health care provider.  2024 Elsevier/Gold Standard (2021-08-05 00:00:00)

## 2023-12-21 ENCOUNTER — Ambulatory Visit (INDEPENDENT_AMBULATORY_CARE_PROVIDER_SITE_OTHER): Payer: Medicare HMO

## 2023-12-21 ENCOUNTER — Other Ambulatory Visit: Payer: Self-pay

## 2023-12-21 VITALS — BP 118/81 | HR 64 | Temp 98.3°F | Resp 18 | Ht 62.0 in | Wt 180.8 lb

## 2023-12-21 DIAGNOSIS — G43119 Migraine with aura, intractable, without status migrainosus: Secondary | ICD-10-CM

## 2023-12-21 MED ORDER — SODIUM CHLORIDE 0.9 % IV SOLN
100.0000 mg | Freq: Once | INTRAVENOUS | Status: AC
  Administered 2023-12-21: 100 mg via INTRAVENOUS
  Filled 2023-12-21: qty 1

## 2023-12-21 NOTE — Progress Notes (Signed)
 Diagnosis: Migraines  Provider:  Praveen Mannam MD  Procedure: IV Infusion  IV Type: Peripheral, IV Location: L Antecubital  Vyepti  (Eptinezumab -jjmr), Dose: 100 mg  Infusion Start Time: 1515  Infusion Stop Time: 1546  Post Infusion IV Care: Peripheral IV Discontinued  Discharge: Condition: Good, Destination: Home . AVS Declined  Performed by:  Donny Childes, RN

## 2023-12-30 ENCOUNTER — Other Ambulatory Visit: Payer: Self-pay | Admitting: Family Medicine

## 2023-12-30 DIAGNOSIS — F419 Anxiety disorder, unspecified: Secondary | ICD-10-CM

## 2023-12-31 NOTE — Telephone Encounter (Signed)
Requesting: alprazolam 1mg   Contract: 05/05/22 UDS: 05/05/22 Last Visit: 06/21/23 Next Visit: None Last Refill: 11/22/23 #30 and 0RF   Please Advise

## 2024-01-04 ENCOUNTER — Telehealth: Payer: Self-pay | Admitting: Family Medicine

## 2024-01-04 NOTE — Telephone Encounter (Signed)
Copied from CRM [id number]. Topic: Medicare AWV >> Jan 04, 2024 11:31 AM Payton Doughty wrote: Reason for CRM: Called LVM 01/04/2024 to schedule AWV. Please schedule Virtual or Telehealth visits ONLY.   Verlee Rossetti; Care Guide Ambulatory Clinical Support [place] [facility] Direct Dial: [phone]

## 2024-01-05 ENCOUNTER — Other Ambulatory Visit: Payer: Self-pay | Admitting: Neurology

## 2024-01-25 ENCOUNTER — Other Ambulatory Visit: Payer: Self-pay | Admitting: Neurology

## 2024-02-01 ENCOUNTER — Ambulatory Visit (INDEPENDENT_AMBULATORY_CARE_PROVIDER_SITE_OTHER): Payer: Medicare HMO

## 2024-02-01 VITALS — Ht 62.0 in | Wt 178.0 lb

## 2024-02-01 DIAGNOSIS — Z0000 Encounter for general adult medical examination without abnormal findings: Secondary | ICD-10-CM

## 2024-02-01 NOTE — Progress Notes (Signed)
 Subjective:   Ebony Garcia is a 73 y.o. female who presents for Medicare Annual (Subsequent) preventive examination.  Visit Complete: Virtual I connected with  Ebony Garcia on 02/01/24 by a audio enabled telemedicine application and verified that I am speaking with the correct person using two identifiers.  Patient Location: Home  Provider Location: Home Office  I discussed the limitations of evaluation and management by telemedicine. The patient expressed understanding and agreed to proceed.  Vital Signs: Because this visit was a virtual/telehealth visit, some criteria may be missing or patient reported. Any vitals not documented were not able to be obtained and vitals that have been documented are patient reported.  Patient Medicare AWV questionnaire was completed by the patient on 01/26/24; I have confirmed that all information answered by patient is correct and no changes since this date.  Cardiac Risk Factors include: advanced age (>31men, >53 women);diabetes mellitus;hypertension     Objective:    Today's Vitals   02/01/24 1437  Weight: 178 lb (80.7 kg)  Height: 5\' 2"  (1.575 m)   Body mass index is 32.56 kg/m.     02/01/2024    2:48 PM 12/21/2023    3:03 PM 08/10/2023    2:03 PM 08/02/2023    3:13 PM 02/05/2023   10:24 AM 02/01/2023    2:44 PM 01/29/2023    2:22 PM  Advanced Directives  Does Patient Have a Medical Advance Directive? Yes Yes Yes Yes Yes Yes Yes  Type of Estate agent of [place];Living will Healthcare Power of [place];Living will Healthcare Power of [place];Living will   Living will Living will  Does patient want to make changes to medical advance directive?       No - Patient declined  Copy of Healthcare Power of Attorney in Chart? No - copy requested   Yes - validated most recent copy scanned in chart (See row information)  Yes - validated most recent copy scanned in chart (See row information)     Current Medications  (verified) Outpatient Encounter Medications as of 02/01/2024  Medication Sig   ALPRAZolam (XANAX) 1 MG tablet TAKE 1 TABLET BY MOUTH EVERY DAY AT BEDTIME AS NEEDED FOR SLEEP   amLODipine (NORVASC) 5 MG tablet TAKE 1 TABLET EVERY DAY   aspirin EC 81 MG tablet Take 1 tablet (81 mg total) by mouth daily. Swallow whole.   Eptinezumab-jjmr (VYEPTI) 100 MG/ML injection Infusion q12 weeks   FLUoxetine (PROZAC) 20 MG capsule TAKE 1 CAPSULE EVERY DAY   furosemide (LASIX) 20 MG tablet TAKE 1 TABLET EVERY DAY   metoCLOPramide (REGLAN) 10 MG tablet Take 1 tablet (10 mg total) by mouth every 6 (six) hours as needed for nausea.   topiramate (TOPAMAX) 25 MG tablet TAKE 1 TABLET AT BEDTIME   triamcinolone ointment (KENALOG) 0.1 % Apply topically as needed   Vitamin D, Ergocalciferol, (DRISDOL) 1.25 MG (50000 UNIT) CAPS capsule Take 1 capsule (50,000 Units total) by mouth every 7 (seven) days.   No facility-administered encounter medications on file as of 02/01/2024.    Allergies (verified) Amoxicillin-pot clavulanate; Antihistamines, diphenhydramine-type; Diphenhydramine hcl; Fremanezumab-vfrm; Clindamycin; Hydrocodone-acetaminophen; Lisinopril; Nortriptyline; Other; Propoxyphene n-acetaminophen; Sulfa antibiotics; Tramadol; and Tramadol hcl   History: Past Medical History:  Diagnosis Date   Acute diastolic CHF (congestive heart failure) (HCC) 04/30/2013   5/24-27/14 acute diastolic heart failure  BNP 1528 2 D echocardiogram essentially negative  Chest x-ray revealed cardiomegaly with costophrenic angle blunting and suggestion of possible interstitial vascular accentuation; no frank  heart failure present. 10 pound weight loss with 20 mg of furosemide daily  No definite etiology of the cardiomegaly or acute diastolic failure    Anxiety 02/07/2015   Auditory hallucination 10/06/2017   B12 deficiency 10/07/2018   Bradycardia 04/30/2013   Cancer (HCC)    Chest pain 05/14/2020   Chest pain with moderate risk  of acute coronary syndrome 02/07/2015   CHF (congestive heart failure) (HCC)    dystolic heart dysfunction   Chronic kidney disease    De Quervain's tenosynovitis, left 05/23/2013   Depression    Depression with anxiety    Depressive disorder, not elsewhere classified 05/31/2007   Centricity Description: DISORDER, DEPRESSIVE NEC Qualifier: Diagnosis of  By: Alwyn Ren MD, Bernardo Heater, [facility]    Diabetes mellitus without complication [facility])    Diet controlled   Dysmetabolic syndrome X 02/27/2008   Qualifier: Diagnosis of  By: Alwyn Ren MD, William  A1c 6.3% in 2/12; TG 278    Essential hypertension 08/09/2008   Qualifier: Diagnosis of  By: Alwyn Ren MD, William     Estrogen deficiency 02/02/2019   Estrogen deficiency 02/02/2019   Fatigue 02/02/2019   Hair loss 02/02/2019   Hyperlipidemia 02/27/2008   Qualifier: Diagnosis of  By: Alwyn Ren MD, William     Hypertension    Intertrigo    INTERTRIGO 11/08/2009   Qualifier: Diagnosis of  By: Peggyann Juba FNP, Melissa S    Intractable migraine with aura without status migrainosus 08/03/2018   Migraine variant 08/15/2008   Qualifier: Diagnosis of  By: Alwyn Ren MD, William   Onset:initially @ age 80;recurrence age 76 during 1st trimester. Recurrence @ 39. No prodrome or aura Previously on Tryptans & Topamax with temporary relief but tolerance appeared Botox by Dr Norval Gable 09/2010  most effective     Migraines    Myalgia 02/02/2019   NONSPECIFIC ABNORMAL ELECTROCARDIOGRAM 08/15/2008   Qualifier: Diagnosis of  By: Alwyn Ren MD, William     Palpitation 04/17/2014   Panic attacks    Panic attacks    Polydipsia 11/02/2007   Qualifier: Diagnosis of  By: Alwyn Ren MD, William     RENAL CALCULUS 02/21/2007   Qualifier: Diagnosis of  By: Daine Gip     Squamous cell carcinoma in situ of skin 12/26/2015   Thrombocytopenia (HCC) 10/06/2017   Viral syndrome 04/10/2019   WEIGHT GAIN 11/02/2007   Qualifier: Diagnosis of  By:  Alwyn Ren MD, Chrissie Noa     Past Surgical History:  Procedure Laterality Date   ABDOMINAL HYSTERECTOMY     BREAST SURGERY     DILATION AND CURETTAGE OF UTERUS     NASAL SEPTUM SURGERY     papaloma Right    TONSILLECTOMY AND ADENOIDECTOMY     TOTAL ABDOMINAL HYSTERECTOMY W/ BILATERAL SALPINGOOPHORECTOMY     for fibroids and migraines   TUBAL LIGATION     Family History  Problem Relation Age of Onset   Crohn's disease Mother    Atrial fibrillation Mother    Heart failure Mother    Bladder Cancer Father    Cancer Father        bladder   Bipolar disorder Brother    Cancer Brother    Bipolar disorder Sister    Heart attack Maternal Grandmother        >65   Cancer Maternal Aunt    Colon cancer Maternal Uncle    Cancer Paternal Grandfather    Cancer Maternal Aunt    Cancer Cousin  Anemia Cousin    Diabetes Neg Hx    Stroke Neg Hx    Social History   Socioeconomic History   Marital status: Divorced    Spouse name: Not on file   Number of children: 3   Years of education: Not on file   Highest education level: 12th grade  Occupational History    Employer: UNEMPLOYED  Tobacco Use   Smoking status: Never   Smokeless tobacco: Never  Vaping Use   Vaping status: Never Used  Substance and Sexual Activity   Alcohol use: No    Alcohol/week: 0.0 standard drinks of alcohol   Drug use: No   Sexual activity: Not Currently    Partners: Male  Other Topics Concern   Not on file  Social History Narrative   Lives alone.        Patient is right-handed. She lives on the first floor.      Drinks caffeine   Social Drivers of Corporate investment banker Strain: High Risk (02/01/2024)   Overall Financial Resource Strain (CARDIA)    Difficulty of Paying Living Expenses: Hard  Food Insecurity: No Food Insecurity (02/01/2024)   Hunger Vital Sign    Worried About Running Out of Food in the Last Year: Never true    Ran Out of Food in the Last Year: Never true  Transportation Needs:  No Transportation Needs (02/01/2024)   PRAPARE - Administrator, Civil Service (Medical): No    Lack of Transportation (Non-Medical): No  Physical Activity: Sufficiently Active (02/01/2024)   Exercise Vital Sign    Days of Exercise per Week: 3 days    Minutes of Exercise per Session: 90 min  Stress: No Stress Concern Present (02/01/2024)   [employer] of Occupational Health - Occupational Stress Questionnaire    Feeling of Stress : Not at all  Social Connections: Unknown (02/01/2024)   Social Connection and Isolation Panel [NHANES]    Frequency of Communication with Friends and Family: More than three times a week    Frequency of Social Gatherings with Friends and Family: More than three times a week    Attends Religious Services: Patient declined    Database administrator or Organizations: Patient declined    Attends Engineer, structural: Not on file    Marital Status: Divorced    Tobacco Counseling Counseling given: Not Answered   Clinical Intake:  Pre-visit preparation completed: Yes  Pain : No/denies pain     BMI - recorded: 32.56 Nutritional Status: BMI > 30  Obese Nutritional Risks: None Diabetes: Yes CBG done?: No Did pt. bring in CBG monitor from home?: No  How often do you need to have someone help you when you read instructions, pamphlets, or other written materials from your doctor or pharmacy?: 1 - Never  Interpreter Needed?: No  Information entered by :: Theresa Mulligan LPN   Activities of Daily Living    02/01/2024    2:44 PM 01/26/2024    5:32 PM  In your present state of health, do you have any difficulty performing the following activities:  Hearing? 0 0  Vision? 0 0  Difficulty concentrating or making decisions? 0 0  Walking or climbing stairs? 1 1  Comment Due to generlized pain.   Dressing or bathing? 0 0  Doing errands, shopping? 0 0  Preparing Food and eating ? N N  Using the Toilet? N N  In the past six months,  have you accidently  leaked urine? N N  Do you have problems with loss of bowel control? N N  Managing your Medications? N N  Managing your Finances? N N  Housekeeping or managing your Housekeeping? N N    Patient Care Team: Zola Button, Grayling Congress, DO as PCP - General (Family Medicine) Herma Mering, MD as Referring Physician (Dermatology) Noland Fordyce, MD as Consulting Physician (Obstetrics and Gynecology) Drema Dallas, DO as Consulting Physician (Neurology) [place], Elvin So, MD as Consulting Physician (Dermatology) Rollene Rotunda, MD as Consulting Physician (Cardiology) Mannie Stabile, OD as Consulting Physician (Optometry) Lurline Hare, LPN (Inactive) as Licensed Practical Nurse  Indicate any recent Medical Services you may have received from other than [facility] providers in the past year (date may be approximate).     Assessment:   This is a routine wellness examination for [place].  Hearing/Vision screen Hearing Screening - Comments:: Denies hearing difficulties   Vision Screening - Comments:: Wears rx glasses - up to date with routine eye exams with  Dr Martha Clan   Goals Addressed               This Visit's Progress     Increase physical activity (pt-stated)        Stay active.       Depression Screen    02/01/2024    3:01 PM 12/21/2023    3:03 PM 06/21/2023    2:47 PM 01/29/2023    2:27 PM 11/05/2022    3:11 PM 01/30/2022    3:11 PM 11/04/2021   10:29 AM  PHQ 2/9 Scores  PHQ - 2 Score 0 0 2 0 0 1 0  PHQ- 9 Score   13        Fall Risk    02/01/2024    2:47 PM 01/26/2024    5:32 PM 12/21/2023    3:03 PM 08/10/2023    2:03 PM 06/21/2023    2:47 PM  Fall Risk   Falls in the past year? 0 0 0 0 1  Number falls in past yr: 0 0 0 0 0  Injury with Fall? 0 0  0 0  Risk for fall due to : No Fall Risks  No Fall Risks    Follow up Falls prevention discussed;Falls evaluation completed  Falls evaluation completed Falls evaluation completed Falls evaluation  completed    MEDICARE RISK AT HOME: Medicare Risk at Home Any stairs in or around the home?: No If so, are there any without handrails?: No Home free of loose throw rugs in walkways, pet beds, electrical cords, etc?: Yes Adequate lighting in your home to reduce risk of falls?: Yes Life alert?: No Use of a cane, walker or w/c?: No Grab bars in the bathroom?: No Shower chair or bench in shower?: No Elevated toilet seat or a handicapped toilet?: No  TIMED UP AND GO:  Was the test performed?  No    Cognitive Function:    05/14/2017    9:55 AM  MMSE - Mini Mental State Exam  Orientation to time 5  Orientation to Place 5  Registration 3  Attention/ Calculation 5  Recall 3  Language- name 2 objects 2  Language- repeat 1  Language- follow 3 step command 3  Language- read & follow direction 1  Write a sentence 1  Copy design 1  Total score 30        02/01/2024    2:49 PM 01/29/2023    2:47 PM  6CIT Screen  What  Year? 0 points 0 points  What month? 0 points 0 points  What time? 0 points 0 points  Count back from 20 0 points 0 points  Months in reverse 0 points 0 points  Repeat phrase 0 points 0 points  Total Score 0 points 0 points    Immunizations Immunization History  Administered Date(s) Administered   Fluad Quad(high Dose 65+) 08/03/2019, 11/04/2021, 11/05/2022   Hepatitis B, ADULT 07/30/2016, 09/29/2016   Influenza Split 09/19/2012   Influenza Whole 11/08/2009   Influenza,inj,Quad PF,6+ Mos 11/07/2013, 07/30/2016, 09/20/2017, 09/14/2018   Influenza-Unspecified 09/11/2015, 10/08/2023   Moderna Sars-Covid-2 Vaccination 02/16/2020, 03/18/2020, 10/04/2020   PPD Test 07/30/2016, 11/03/2017   Pneumococcal Conjugate-13 08/03/2019   Td 11/08/2009   Zoster Recombinant(Shingrix) 01/17/2022    TDAP status: Due, Education has been provided regarding the importance of this vaccine. Advised may receive this vaccine at local pharmacy or Health Dept. Aware to provide a copy  of the vaccination record if obtained from local pharmacy or Health Dept. Verbalized acceptance and understanding.  Flu Vaccine status: Up to date  Pneumococcal vaccine status: Declined,  Education has been provided regarding the importance of this vaccine but patient still declined. Advised may receive this vaccine at local pharmacy or Health Dept. Aware to provide a copy of the vaccination record if obtained from local pharmacy or Health Dept. Verbalized acceptance and understanding.   Covid-19 vaccine status: Declined, Education has been provided regarding the importance of this vaccine but patient still declined. Advised may receive this vaccine at local pharmacy or Health Dept.or vaccine clinic. Aware to provide a copy of the vaccination record if obtained from local pharmacy or Health Dept. Verbalized acceptance and understanding.  Qualifies for Shingles Vaccine? Yes   Zostavax completed No   Shingrix Completed?: No.    Education has been provided regarding the importance of this vaccine. Patient has been advised to call insurance company to determine out of pocket expense if they have not yet received this vaccine. Advised may also receive vaccine at local pharmacy or Health Dept. Verbalized acceptance and understanding.  Screening Tests Health Maintenance  Topic Date Due   FOOT EXAM  Never done   Fecal DNA (Cologuard)  Never done   MAMMOGRAM  09/10/2017   DEXA SCAN  Never done   Pneumonia Vaccine 26+ Years old (2 of 2 - PPSV23 or PCV20) 09/28/2019   DTaP/Tdap/Td (2 - Tdap) 11/09/2019   Zoster Vaccines- Shingrix (2 of 2) 03/14/2022   Diabetic kidney evaluation - Urine ACR  11/04/2022   COVID-19 Vaccine (4 - 2024-25 season) 08/08/2023   HEMOGLOBIN A1C  12/22/2023   OPHTHALMOLOGY EXAM  03/16/2024   Diabetic kidney evaluation - eGFR measurement  08/01/2024   Medicare Annual Wellness (AWV)  01/31/2025   INFLUENZA VACCINE  Completed   Hepatitis C Screening  Completed   HPV VACCINES   Aged Out   Colonoscopy  Discontinued    Health Maintenance  Health Maintenance Due  Topic Date Due   FOOT EXAM  Never done   Fecal DNA (Cologuard)  Never done   MAMMOGRAM  09/10/2017   DEXA SCAN  Never done   Pneumonia Vaccine 70+ Years old (2 of 2 - PPSV23 or PCV20) 09/28/2019   DTaP/Tdap/Td (2 - Tdap) 11/09/2019   Zoster Vaccines- Shingrix (2 of 2) 03/14/2022   Diabetic kidney evaluation - Urine ACR  11/04/2022   COVID-19 Vaccine (4 - 2024-25 season) 08/08/2023   HEMOGLOBIN A1C  12/22/2023    Colorectal cancer screening:  Referral to GI placed Deferred. Pt aware the office will call re: appt.  Mammogram status: Ordered Patient deferred. Pt provided with contact info and advised to call to schedule appt.   Bone Density status: Ordered Patient deferred. Pt provided with contact info and advised to call to schedule appt.    Additional Screening:  Hepatitis C Screening: does qualify; Completed 05/14/17  Vision Screening: Recommended annual ophthalmology exams for early detection of glaucoma and other disorders of the eye. Is the patient up to date with their annual eye exam?  Yes  Who is the provider or what is the name of the office in which the patient attends annual eye exams? Dr Martha Clan If pt is not established with a provider, would they like to be referred to a provider to establish care? No .   Dental Screening: Recommended annual dental exams for proper oral hygiene  Diabetic Foot Exam: Diabetic Foot Exam: Overdue, Pt has been advised about the importance in completing this exam. Pt is scheduled for diabetic foot exam on Deferred.  Community Resource Referral / Chronic Care Management:  CRR required this visit?  No   CCM required this visit?  No     Plan:     I have personally reviewed and noted the following in the patient's chart:   Medical and social history Use of alcohol, tobacco or illicit drugs  Current medications and supplements including opioid  prescriptions. Patient is not currently taking opioid prescriptions. Functional ability and status Nutritional status Physical activity Advanced directives List of other physicians Hospitalizations, surgeries, and ER visits in previous 12 months Vitals Screenings to include cognitive, depression, and falls Referrals and appointments  In addition, I have reviewed and discussed with patient certain preventive protocols, quality metrics, and best practice recommendations. A written personalized care plan for preventive services as well as general preventive health recommendations were provided to patient.     Tillie Rung, LPN   1/61/0960   After Visit Summary: (MyChart) Due to this being a telephonic visit, the after visit summary with patients personalized plan was offered to patient via MyChart   Nurse Notes: None

## 2024-02-01 NOTE — Patient Instructions (Addendum)
 Ebony Garcia , Thank you for taking time to come for your Medicare Wellness Visit. I appreciate your ongoing commitment to your health goals. Please review the following plan we discussed and let me know if I can assist you in the future.   Referrals/Orders/Follow-Ups/Clinician Recommendations:   This is a list of the screening recommended for you and due dates:  Health Maintenance  Topic Date Due   Complete foot exam   Never done   Cologuard (Stool DNA test)  Never done   Mammogram  09/10/2017   DEXA scan (bone density measurement)  Never done   Pneumonia Vaccine (2 of 2 - PPSV23 or PCV20) 09/28/2019   DTaP/Tdap/Td vaccine (2 - Tdap) 11/09/2019   Zoster (Shingles) Vaccine (2 of 2) 03/14/2022   Yearly kidney health urinalysis for diabetes  11/04/2022   COVID-19 Vaccine (4 - 2024-25 season) 08/08/2023   Hemoglobin A1C  12/22/2023   Eye exam for diabetics  03/16/2024   Yearly kidney function blood test for diabetes  08/01/2024   Medicare Annual Wellness Visit  01/31/2025   Flu Shot  Completed   Hepatitis C Screening  Completed   HPV Vaccine  Aged Out   Colon Cancer Screening  Discontinued    Advanced directives: (Copy Requested) Please bring a copy of your health care power of attorney and living will to the office to be added to your chart at your convenience.  Next Medicare Annual Wellness Visit scheduled for next year: Yes

## 2024-02-02 ENCOUNTER — Inpatient Hospital Stay (HOSPITAL_BASED_OUTPATIENT_CLINIC_OR_DEPARTMENT_OTHER): Payer: Medicare HMO | Admitting: Family

## 2024-02-02 ENCOUNTER — Inpatient Hospital Stay: Payer: Medicare HMO | Attending: Family

## 2024-02-02 ENCOUNTER — Encounter: Payer: Self-pay | Admitting: Family

## 2024-02-02 ENCOUNTER — Inpatient Hospital Stay: Payer: Medicare HMO

## 2024-02-02 VITALS — BP 115/65 | HR 68 | Temp 97.8°F | Resp 18 | Wt 182.0 lb

## 2024-02-02 DIAGNOSIS — D696 Thrombocytopenia, unspecified: Secondary | ICD-10-CM | POA: Diagnosis not present

## 2024-02-02 DIAGNOSIS — E538 Deficiency of other specified B group vitamins: Secondary | ICD-10-CM

## 2024-02-02 DIAGNOSIS — D6959 Other secondary thrombocytopenia: Secondary | ICD-10-CM | POA: Diagnosis not present

## 2024-02-02 LAB — CMP (CANCER CENTER ONLY)
ALT: 17 U/L (ref 0–44)
AST: 16 U/L (ref 15–41)
Albumin: 4.7 g/dL (ref 3.5–5.0)
Alkaline Phosphatase: 89 U/L (ref 38–126)
Anion gap: 8 (ref 5–15)
BUN: 15 mg/dL (ref 8–23)
CO2: 27 mmol/L (ref 22–32)
Calcium: 9.6 mg/dL (ref 8.9–10.3)
Chloride: 105 mmol/L (ref 98–111)
Creatinine: 0.99 mg/dL (ref 0.44–1.00)
GFR, Estimated: >60 mL/min (ref >60)
Glucose, Bld: 166 mg/dL — ABNORMAL HIGH (ref 70–99)
Potassium: 4 mmol/L (ref 3.5–5.1)
Sodium: 140 mmol/L (ref 135–145)
Total Bilirubin: 0.4 mg/dL (ref 0.0–1.2)
Total Protein: 6.9 g/dL (ref 6.5–8.1)

## 2024-02-02 LAB — CBC WITH DIFFERENTIAL (CANCER CENTER ONLY)
Abs Immature Granulocytes: 0.03 10*3/uL (ref 0.00–0.07)
Basophils Absolute: 0.1 10*3/uL (ref 0.0–0.1)
Basophils Relative: 1 %
Eosinophils Absolute: 0.2 10*3/uL (ref 0.0–0.5)
Eosinophils Relative: 3 %
HCT: 43.3 % (ref 36.0–46.0)
Hemoglobin: 13.8 g/dL (ref 12.0–15.0)
Immature Granulocytes: 1 %
Lymphocytes Relative: 24 %
Lymphs Abs: 1.5 10*3/uL (ref 0.7–4.0)
MCH: 30 pg (ref 26.0–34.0)
MCHC: 31.9 g/dL (ref 30.0–36.0)
MCV: 94.1 fL (ref 80.0–100.0)
Monocytes Absolute: 0.3 10*3/uL (ref 0.1–1.0)
Monocytes Relative: 5 %
Neutro Abs: 4.2 10*3/uL (ref 1.7–7.7)
Neutrophils Relative %: 66 %
Platelet Count: 73 10*3/uL — ABNORMAL LOW (ref 150–400)
RBC: 4.6 MIL/uL (ref 3.87–5.11)
RDW: 13.2 % (ref 11.5–15.5)
WBC Count: 6.3 10*3/uL (ref 4.0–10.5)
nRBC: 0 % (ref 0.0–0.2)

## 2024-02-02 LAB — VITAMIN B12: Vitamin B-12: 400 pg/mL (ref 180–914)

## 2024-02-02 MED ORDER — CYANOCOBALAMIN 1000 MCG/ML IJ SOLN
1000.0000 ug | Freq: Once | INTRAMUSCULAR | Status: AC
  Administered 2024-02-02: 1000 ug via INTRAMUSCULAR
  Filled 2024-02-02: qty 1

## 2024-02-02 NOTE — Progress Notes (Signed)
 Hematology and Oncology Follow Up Visit  Ebony Garcia [id number] 23-Jul-1951 73 y.o. 02/02/2024   Principle Diagnosis:  Medication induced thrombocytopenia B 12 deficiency    Current Therapy:        Observation B 12 injection Q 6 weeks   Interim History:  Ebony Garcia is here today for follow-up and B 12 injection. She notes chronic fatigue.  No fever, chills, n/v, cough, rash, dizziness, SOB, chest pain, palpitations, abdominal pain or changes in bowel or bladder habits at this time.  Numbness and tingling in the hands and feet unchanged from baseline.  No falls or syncope reported.  No swelling.  Platelets are stable at 73.  She has not noted ant blood loss. No abnormal bruising, no petechiae.  Appetite and hydration are good. Weight is 182 lbs.   ECOG Performance Status: 1 - Symptomatic but completely ambulatory  Medications:  Allergies as of 02/02/2024       Reactions   Amoxicillin-pot Clavulanate Diarrhea, Other (See Comments)   Antihistamines, Diphenhydramine-type Other (See Comments)   Other Reaction: makes pt nervous   Diphenhydramine Hcl Other (See Comments)   Other Reaction: makes pt nervous   Fremanezumab-vfrm Rash, Other (See Comments)   Seizure, jerking   Clindamycin Other (See Comments)   She has not taken but per patient  "her mother had a reaction and she does not want it ever"   Hydrocodone-acetaminophen Other (See Comments)   REACTION: sensitive to  but can take for extreme pain per patient   Lisinopril    Restless , irregular sleep   Nortriptyline Other (See Comments)   UNKNOWN REACTION   Other Other (See Comments)   Propoxyphene N-acetaminophen Nausea Only   Sulfa Antibiotics Other (See Comments)   Unknown reaction   Tramadol Other (See Comments)   UNKNOWN REACTION   Tramadol Hcl Other (See Comments)        Medication List        Accurate as of February 02, 2024  2:08 PM. If you have any questions, ask your nurse or doctor.           ALPRAZolam 1 MG tablet Commonly known as: XANAX TAKE 1 TABLET BY MOUTH EVERY DAY AT BEDTIME AS NEEDED FOR SLEEP   amLODipine 5 MG tablet Commonly known as: NORVASC TAKE 1 TABLET EVERY DAY   aspirin EC 81 MG tablet Take 1 tablet (81 mg total) by mouth daily. Swallow whole.   FLUoxetine 20 MG capsule Commonly known as: PROZAC TAKE 1 CAPSULE EVERY DAY   furosemide 20 MG tablet Commonly known as: LASIX TAKE 1 TABLET EVERY DAY   metoCLOPramide 10 MG tablet Commonly known as: Reglan Take 1 tablet (10 mg total) by mouth every 6 (six) hours as needed for nausea.   topiramate 25 MG tablet Commonly known as: TOPAMAX TAKE 1 TABLET AT BEDTIME   triamcinolone ointment 0.1 % Commonly known as: KENALOG Apply topically as needed   Vitamin D (Ergocalciferol) 1.25 MG (50000 UNIT) Caps capsule Commonly known as: DRISDOL Take 1 capsule (50,000 Units total) by mouth every 7 (seven) days.   Vyepti 100 MG/ML injection Generic drug: Eptinezumab-jjmr Infusion q12 weeks        Allergies:  Allergies  Allergen Reactions   Amoxicillin-Pot Clavulanate Diarrhea and Other (See Comments)   Antihistamines, Diphenhydramine-Type Other (See Comments)    Other Reaction: makes pt nervous   Diphenhydramine Hcl Other (See Comments)    Other Reaction: makes pt nervous   Fremanezumab-Vfrm Rash and Other (  See Comments)    Seizure, jerking   Clindamycin Other (See Comments)    She has not taken but per patient  "her mother had a reaction and she does not want it ever"   Hydrocodone-Acetaminophen Other (See Comments)    REACTION: sensitive to  but can take for extreme pain per patient   Lisinopril     Restless , irregular sleep   Nortriptyline Other (See Comments)    UNKNOWN REACTION   Other Other (See Comments)   Propoxyphene N-Acetaminophen Nausea Only   Sulfa Antibiotics Other (See Comments)    Unknown reaction   Tramadol Other (See Comments)    UNKNOWN REACTION   Tramadol Hcl Other  (See Comments)    Past Medical History, Surgical history, Social history, and Family History were reviewed and updated.  Review of Systems: All other 10 point review of systems is negative.   Physical Exam:  weight is 182 lb (82.6 kg). Her oral temperature is 97.8 F (36.6 C). Her blood pressure is 115/65 and her pulse is 68. Her respiration is 18 and oxygen saturation is 100%.   Wt Readings from Last 3 Encounters:  02/02/24 182 lb (82.6 kg)  02/01/24 178 lb (80.7 kg)  12/21/23 180 lb 12.8 oz (82 kg)    Ocular: Sclerae unicteric, pupils equal, round and reactive to light Ear-nose-throat: Oropharynx clear, dentition fair Lymphatic: No cervical or supraclavicular adenopathy Lungs no rales or rhonchi, good excursion bilaterally Heart regular rate and rhythm, no murmur appreciated Abd soft, nontender, positive bowel sounds MSK no focal spinal tenderness, no joint edema Neuro: non-focal, well-oriented, appropriate affect Breasts: Deferred  Lab Results  Component Value Date   WBC 6.3 02/02/2024   HGB 13.8 02/02/2024   HCT 43.3 02/02/2024   MCV 94.1 02/02/2024   PLT 73 (L) 02/02/2024   No results found for: "FERRITIN", "IRON", "TIBC", "UIBC", "IRONPCTSAT" Lab Results  Component Value Date   RBC 4.60 02/02/2024   No results found for: "KPAFRELGTCHN", "LAMBDASER", "KAPLAMBRATIO" No results found for: "IGGSERUM", "IGA", "IGMSERUM" No results found for: "TOTALPROTELP", "ALBUMINELP", "A1GS", "A2GS", "BETS", "BETA2SER", "GAMS", "MSPIKE", "SPEI"   Chemistry      Component Value Date/Time   NA 140 02/02/2024 1316   NA 143 11/11/2017 1339   K 4.0 02/02/2024 1316   K 3.6 11/11/2017 1339   CL 105 02/02/2024 1316   CL 109 (H) 11/11/2017 1339   CO2 27 02/02/2024 1316   CO2 24 11/11/2017 1339   BUN 15 02/02/2024 1316   BUN 9 11/11/2017 1339   CREATININE 0.99 02/02/2024 1316   CREATININE 1.00 (H) 10/18/2020 1433      Component Value Date/Time   CALCIUM 9.6 02/02/2024 1316    CALCIUM 9.3 11/11/2017 1339   ALKPHOS 89 02/02/2024 1316   ALKPHOS 85 (H) 11/11/2017 1339   AST 16 02/02/2024 1316   ALT 17 02/02/2024 1316   ALT 24 11/11/2017 1339   BILITOT 0.4 02/02/2024 1316       Impression and Plan: Ebony Garcia is a pleasant 73 yo caucasian female with thrombocytopenia and B12 deficiency.  B 12 injection given earlier this month.  B 12 injection every 6 weeks.  Follow-up in 6 months.   Eileen Stanford, NP 2/26/20252:08 PM

## 2024-02-02 NOTE — Patient Instructions (Signed)
 Vitamin B12 Injection What is this medication? Vitamin B12 (VAHY tuh min B12) prevents and treats low vitamin B12 levels in your body. It is used in people who do not get enough vitamin B12 from their diet or when their digestive tract does not absorb enough. Vitamin B12 plays an important role in maintaining the health of your nervous system and red blood cells. This medicine may be used for other purposes; ask your health care provider or pharmacist if you have questions. COMMON BRAND NAME(S): B-12 Compliance Kit, B-12 Injection Kit, Cyomin, Dodex, LA-12, Nutri-Twelve, Physicians EZ Use B-12, Primabalt, Vitamin Deficiency Injectable System - B12 What should I tell my care team before I take this medication? They need to know if you have any of these conditions: Kidney disease Leber's disease Megaloblastic anemia An unusual or allergic reaction to cyanocobalamin, cobalt, other medications, foods, dyes, or preservatives Pregnant or trying to get pregnant Breast-feeding How should I use this medication? This medication is injected into a muscle or deeply under the skin. It is usually given in a clinic or care team's office. However, your care team may teach you how to inject yourself. Follow all instructions. Talk to your care team about the use of this medication in children. Special care may be needed. Overdosage: If you think you have taken too much of this medicine contact a poison control center or emergency room at once. NOTE: This medicine is only for you. Do not share this medicine with others. What if I miss a dose? If you are given your dose at a clinic or care team's office, call to reschedule your appointment. If you give your own injections, and you miss a dose, take it as soon as you can. If it is almost time for your next dose, take only that dose. Do not take double or extra doses. What may interact with this medication? Alcohol Colchicine This list may not describe all possible  interactions. Give your health care provider a list of all the medicines, herbs, non-prescription drugs, or dietary supplements you use. Also tell them if you smoke, drink alcohol, or use illegal drugs. Some items may interact with your medicine. What should I watch for while using this medication? Visit your care team regularly. You may need blood work done while you are taking this medication. You may need to follow a special diet. Talk to your care team. Limit your alcohol intake and avoid smoking to get the best benefit. What side effects may I notice from receiving this medication? Side effects that you should report to your care team as soon as possible: Allergic reactions--skin rash, itching, hives, swelling of the face, lips, tongue, or throat Swelling of the ankles, hands, or feet Trouble breathing Side effects that usually do not require medical attention (report to your care team if they continue or are bothersome): Diarrhea This list may not describe all possible side effects. Call your doctor for medical advice about side effects. You may report side effects to FDA at 1-800-FDA-1088. Where should I keep my medication? Keep out of the reach of children. Store at room temperature between 15 and 30 degrees C (59 and 85 degrees F). Protect from light. Throw away any unused medication after the expiration date. NOTE: This sheet is a summary. It may not cover all possible information. If you have questions about this medicine, talk to your doctor, pharmacist, or health care provider.  2024 Elsevier/Gold Standard (2021-08-05 00:00:00)

## 2024-02-08 ENCOUNTER — Other Ambulatory Visit: Payer: Self-pay | Admitting: Family Medicine

## 2024-02-08 DIAGNOSIS — F419 Anxiety disorder, unspecified: Secondary | ICD-10-CM

## 2024-02-09 NOTE — Telephone Encounter (Signed)
 Requesting: Xanax Contract: 2023 UDS: 2023 Last OV: 06/21/23 Next OV: n/a  Last Refill: 12/31/2023, #30--0 RF Database:   Please advise

## 2024-02-10 ENCOUNTER — Encounter: Payer: Self-pay | Admitting: Pulmonary Disease

## 2024-02-22 NOTE — Progress Notes (Deleted)
 NEUROLOGY FOLLOW UP OFFICE NOTE  Ebony Garcia [id number]  Assessment/Plan:   Migraine without aura, without status migrainosus, not intractable - doing well on Vyepti        Migraine prevention:  Vyepti 100mg  every 3 months; decrease topiramate to 25mg  at bedtime.  If doing well  she may discontinue it in 3 months but otherwise she may continue it until her next follow up. Limit use of pain relievers to no more than 2 days out of week to prevent risk of rebound or medication-overuse headache. Keep headache diary Follow up 6-7 months.     Subjective:  Ebony Garcia is a 73 year old right-handed woman with hypertension, CHF, B12 deficiency, depression, tinnitus and hyperlipidemia who follows up for migraines.   UPDATE Decreased topiramate to 25mg  at bedtime. Still no migraines since April 2023.   ***    Current NSAIDS:  ASA 81mg  Current analgesics:  no. Current triptans:   none Current anti-emetic:  no Current muscle relaxants:  no Current anti-anxiolytic:  Xanax 1mg  Current sleep aide:  Xanax 1mg  Current Antihypertensive medications:  Lasix Current Antidepressant medications:  Prozac 20mg  Current Anticonvulsant medications:  topiramate 25mg  BID Current anti-CGRP:  Vyepti 100mg     Depression and anxiety:  stable She reports off and on body aches, left hip pain Reports drinking 64 oz water daily    HISTORY: Onset:  Since her late 30s. Location: usually left sided, periorbital, and back of head.  More recently, holocephalic Quality:  Sharp, aching pressure.  Non-throbbing Initial intensity:  7/10; Sept: 7/10 Associated symptoms:  Nausea, photophobia and phonophobia.  No visual disturbance, osmophobia, vomiting or unilateral numbness or weakness Aura:  no Initial duration:  3 days; Sept: 30 minutes with Maxalt Initial frequency:  25 headache days per month Triggers:  Emotional stress Relieving factors:  Cold mask, heating pack on neck and shoulders    Past NSAIDs:  naproxen (ineffective), indomethacin, toradol tablet (ineffective), Toradol 60mg  IM (effective), Cambia (side effects) Past analgesics:  Tylenol (ineffective), Excedrin (ineffective), Lidocaine nasal drops (effective but caused hives), tramadol (side effects), Midrin (effective but made her sleepy) Past triptans/ergots:  sumatriptan 100mg , Zomig 5mg  (effective), Frova, Relpax, DHE (effective), Maxalt (effective but unable to afford it), Tosymra NS, Zomig 5mg  NS Past antiemetic:  Promethazine, Zofran, Reglan Past anxiolytics:  Vicodin Past muscle relaxants:  cyclobenzaprine Past antihypertensives:  propranolol (ineffective), atenolol (ineffective), lisinopril, metoprolol, HCTZ, losartan Past antidepressants:  amitriptyline (side effects), venlafaxine (ineffective), nortriptyline (effective but side effects such as rash), imipramine Past antiepileptics:  Depakote (hair loss), lamotrigine (side effects), zonisamide (side effects), gabapentin, Keppra Past CGRP inhibitor:  Aimovig (effective but concerned about side effects- ankle jerking, abnormal skin pattern), Ajovy (same side effects); Emgality (cost) Past vitamins and supplements:  magnesium (ineffective), feverfew (ineffective) Other past therapy:  Botox (one round, flu like symptoms, unable to afford 20% copay), occipital nerve blocks, trigger point injections, alternating heat/ice   Family history of headache:  Mother, grandmother, great-aunt, cousins.   08/16/08 MRI Brain w/wo performed for "explosive headaches": nonspecific punctate hyperintensities in the subcortical white matter. 08/16/08 MRA Head: Unremarkable.  There is mild attenuation in the vertebrobasilar junction and proximal basilar artery which is likely artifact. 07/05/19 CTA of Head:  Unremarkable for mass lesion, high-grade arterial stenosis, aneurysm or other acute intracranial abnormality.  PAST MEDICAL HISTORY: Past Medical History:  Diagnosis Date   Acute  diastolic CHF (congestive heart failure) (HCC) 04/30/2013   5/24-27/14 acute diastolic heart failure  BNP 1528 2 D echocardiogram essentially  negative  Chest x-ray revealed cardiomegaly with costophrenic angle blunting and suggestion of possible interstitial vascular accentuation; no frank heart failure present. 10 pound weight loss with 20 mg of furosemide daily  No definite etiology of the cardiomegaly or acute diastolic failure    Anxiety 02/07/2015   Auditory hallucination 10/06/2017   B12 deficiency 10/07/2018   Bradycardia 04/30/2013   Cancer (HCC)    Chest pain 05/14/2020   Chest pain with moderate risk of acute coronary syndrome 02/07/2015   CHF (congestive heart failure) (HCC)    dystolic heart dysfunction   Chronic kidney disease    De Quervain's tenosynovitis, left 05/23/2013   Depression    Depression with anxiety    Depressive disorder, not elsewhere classified 05/31/2007   Centricity Description: DISORDER, DEPRESSIVE NEC Qualifier: Diagnosis of  By: Alwyn Ren MD, Bernardo Heater, [facility]    Diabetes mellitus without complication [facility])    Diet controlled   Dysmetabolic syndrome X 02/27/2008   Qualifier: Diagnosis of  By: Alwyn Ren MD, William  A1c 6.3% in 2/12; TG 278    Essential hypertension 08/09/2008   Qualifier: Diagnosis of  By: Alwyn Ren MD, William     Estrogen deficiency 02/02/2019   Estrogen deficiency 02/02/2019   Fatigue 02/02/2019   Hair loss 02/02/2019   Hyperlipidemia 02/27/2008   Qualifier: Diagnosis of  By: Alwyn Ren MD, William     Hypertension    Intertrigo    INTERTRIGO 11/08/2009   Qualifier: Diagnosis of  By: Peggyann Juba FNP, Melissa S    Intractable migraine with aura without status migrainosus 08/03/2018   Migraine variant 08/15/2008   Qualifier: Diagnosis of  By: Alwyn Ren MD, William   Onset:initially @ age 21;recurrence age 40 during 1st trimester. Recurrence @ 39. No prodrome or aura Previously on Tryptans & Topamax with  temporary relief but tolerance appeared Botox by Dr Norval Gable 09/2010  most effective     Migraines    Myalgia 02/02/2019   NONSPECIFIC ABNORMAL ELECTROCARDIOGRAM 08/15/2008   Qualifier: Diagnosis of  By: Alwyn Ren MD, William     Palpitation 04/17/2014   Panic attacks    Panic attacks    Polydipsia 11/02/2007   Qualifier: Diagnosis of  By: Alwyn Ren MD, William     RENAL CALCULUS 02/21/2007   Qualifier: Diagnosis of  By: Daine Gip     Squamous cell carcinoma in situ of skin 12/26/2015   Thrombocytopenia (HCC) 10/06/2017   Viral syndrome 04/10/2019   WEIGHT GAIN 11/02/2007   Qualifier: Diagnosis of  By: Alwyn Ren MD, Chrissie Noa      MEDICATIONS: Current Outpatient Medications on File Prior to Visit  Medication Sig Dispense Refill   ALPRAZolam (XANAX) 1 MG tablet TAKE 1 TABLET BY MOUTH EVERY DAY AT BEDTIME AS NEEDED FOR SLEEP 30 tablet 0   amLODipine (NORVASC) 5 MG tablet TAKE 1 TABLET EVERY DAY 90 tablet 3   aspirin EC 81 MG tablet Take 1 tablet (81 mg total) by mouth daily. Swallow whole. 30 tablet 11   Eptinezumab-jjmr (VYEPTI) 100 MG/ML injection Infusion q12 weeks 1.12 mL 0   FLUoxetine (PROZAC) 20 MG capsule TAKE 1 CAPSULE EVERY DAY 90 capsule 3   furosemide (LASIX) 20 MG tablet TAKE 1 TABLET EVERY DAY 90 tablet 3   metoCLOPramide (REGLAN) 10 MG tablet Take 1 tablet (10 mg total) by mouth every 6 (six) hours as needed for nausea. 20 tablet 5   topiramate (TOPAMAX) 25 MG tablet TAKE 1 TABLET AT BEDTIME 90 tablet 0  triamcinolone ointment (KENALOG) 0.1 % Apply topically as needed     Vitamin D, Ergocalciferol, (DRISDOL) 1.25 MG (50000 UNIT) CAPS capsule Take 1 capsule (50,000 Units total) by mouth every 7 (seven) days. 12 capsule 1   No current facility-administered medications on file prior to visit.     ALLERGIES: Allergies  Allergen Reactions   Amoxicillin-Pot Clavulanate Diarrhea and Other (See Comments)   Antihistamines, Diphenhydramine-Type Other (See Comments)     Other Reaction: makes pt nervous   Diphenhydramine Hcl Other (See Comments)    Other Reaction: makes pt nervous   Fremanezumab-Vfrm Rash and Other (See Comments)    Seizure, jerking   Clindamycin Other (See Comments)    She has not taken but per patient  "her mother had a reaction and she does not want it ever"   Hydrocodone-Acetaminophen Other (See Comments)    REACTION: sensitive to  but can take for extreme pain per patient   Lisinopril     Restless , irregular sleep   Nortriptyline Other (See Comments)    UNKNOWN REACTION   Other Other (See Comments)   Propoxyphene N-Acetaminophen Nausea Only   Sulfa Antibiotics Other (See Comments)    Unknown reaction   Tramadol Other (See Comments)    UNKNOWN REACTION   Tramadol Hcl Other (See Comments)    FAMILY HISTORY: Family History  Problem Relation Age of Onset   Crohn's disease Mother    Atrial fibrillation Mother    Heart failure Mother    Bladder Cancer Father    Cancer Father        bladder   Bipolar disorder Brother    Cancer Brother    Bipolar disorder Sister    Heart attack Maternal Grandmother        >65   Cancer Maternal Aunt    Colon cancer Maternal Uncle    Cancer Paternal Grandfather    Cancer Maternal Aunt    Cancer Cousin    Anemia Cousin    Diabetes Neg Hx    Stroke Neg Hx       Objective:  *** General: No acute distress.  Patient appears well-groomed.   Head:  Normocephalic/atraumatic Neck:  Supple.  No paraspinal tenderness.  Full range of motion. Heart:  Regular rate and rhythm. Neuro:  Alert and oriented.  Speech fluent and not dysarthric.  Language intact.  CN II-XII intact.  Bulk and tone normal.  Muscle strength 5/5 throughout.  Deep tendon reflexes 2+ throughout.  Gait normal.  Romberg negative.   Shon Millet, DO  CC: Seabron Spates, DO

## 2024-02-23 ENCOUNTER — Ambulatory Visit: Payer: Medicare HMO | Admitting: Neurology

## 2024-03-08 ENCOUNTER — Encounter: Payer: Self-pay | Admitting: Neurology

## 2024-03-15 ENCOUNTER — Inpatient Hospital Stay: Payer: Medicare HMO | Attending: Family

## 2024-03-15 VITALS — BP 110/62 | HR 75 | Temp 97.9°F | Resp 20

## 2024-03-15 DIAGNOSIS — E538 Deficiency of other specified B group vitamins: Secondary | ICD-10-CM | POA: Diagnosis not present

## 2024-03-15 DIAGNOSIS — D6959 Other secondary thrombocytopenia: Secondary | ICD-10-CM | POA: Diagnosis not present

## 2024-03-15 MED ORDER — CYANOCOBALAMIN 1000 MCG/ML IJ SOLN
1000.0000 ug | Freq: Once | INTRAMUSCULAR | Status: AC
  Administered 2024-03-15: 1000 ug via INTRAMUSCULAR
  Filled 2024-03-15: qty 1

## 2024-03-15 NOTE — Patient Instructions (Signed)
 Vitamin B12 Injection What is this medication? Vitamin B12 (VAHY tuh min B12) prevents and treats low vitamin B12 levels in your body. It is used in people who do not get enough vitamin B12 from their diet or when their digestive tract does not absorb enough. Vitamin B12 plays an important role in maintaining the health of your nervous system and red blood cells. This medicine may be used for other purposes; ask your health care provider or pharmacist if you have questions. COMMON BRAND NAME(S): B-12 Compliance Kit, B-12 Injection Kit, Cyomin, Dodex, LA-12, Nutri-Twelve, Physicians EZ Use B-12, Primabalt, Vitamin Deficiency Injectable System - B12 What should I tell my care team before I take this medication? They need to know if you have any of these conditions: Kidney disease Leber's disease Megaloblastic anemia An unusual or allergic reaction to cyanocobalamin, cobalt, other medications, foods, dyes, or preservatives Pregnant or trying to get pregnant Breast-feeding How should I use this medication? This medication is injected into a muscle or deeply under the skin. It is usually given in a clinic or care team's office. However, your care team may teach you how to inject yourself. Follow all instructions. Talk to your care team about the use of this medication in children. Special care may be needed. Overdosage: If you think you have taken too much of this medicine contact a poison control center or emergency room at once. NOTE: This medicine is only for you. Do not share this medicine with others. What if I miss a dose? If you are given your dose at a clinic or care team's office, call to reschedule your appointment. If you give your own injections, and you miss a dose, take it as soon as you can. If it is almost time for your next dose, take only that dose. Do not take double or extra doses. What may interact with this medication? Alcohol Colchicine This list may not describe all possible  interactions. Give your health care provider a list of all the medicines, herbs, non-prescription drugs, or dietary supplements you use. Also tell them if you smoke, drink alcohol, or use illegal drugs. Some items may interact with your medicine. What should I watch for while using this medication? Visit your care team regularly. You may need blood work done while you are taking this medication. You may need to follow a special diet. Talk to your care team. Limit your alcohol intake and avoid smoking to get the best benefit. What side effects may I notice from receiving this medication? Side effects that you should report to your care team as soon as possible: Allergic reactions--skin rash, itching, hives, swelling of the face, lips, tongue, or throat Swelling of the ankles, hands, or feet Trouble breathing Side effects that usually do not require medical attention (report to your care team if they continue or are bothersome): Diarrhea This list may not describe all possible side effects. Call your doctor for medical advice about side effects. You may report side effects to FDA at 1-800-FDA-1088. Where should I keep my medication? Keep out of the reach of children. Store at room temperature between 15 and 30 degrees C (59 and 85 degrees F). Protect from light. Throw away any unused medication after the expiration date. NOTE: This sheet is a summary. It may not cover all possible information. If you have questions about this medicine, talk to your doctor, pharmacist, or health care provider.  2024 Elsevier/Gold Standard (2021-08-05 00:00:00)

## 2024-03-20 ENCOUNTER — Other Ambulatory Visit: Payer: Self-pay | Admitting: Family Medicine

## 2024-03-20 ENCOUNTER — Ambulatory Visit (INDEPENDENT_AMBULATORY_CARE_PROVIDER_SITE_OTHER): Payer: Medicare HMO

## 2024-03-20 VITALS — BP 113/70 | HR 54 | Temp 98.0°F | Resp 20 | Ht 62.0 in | Wt 181.2 lb

## 2024-03-20 DIAGNOSIS — F419 Anxiety disorder, unspecified: Secondary | ICD-10-CM

## 2024-03-20 DIAGNOSIS — G43119 Migraine with aura, intractable, without status migrainosus: Secondary | ICD-10-CM | POA: Diagnosis not present

## 2024-03-20 MED ORDER — SODIUM CHLORIDE 0.9 % IV SOLN
100.0000 mg | Freq: Once | INTRAVENOUS | Status: AC
  Administered 2024-03-20: 100 mg via INTRAVENOUS
  Filled 2024-03-20: qty 1

## 2024-03-20 NOTE — Progress Notes (Signed)
 Diagnosis: Miagraines  Provider:  Praveen Mannam MD  Procedure: IV Infusion  IV Type: Peripheral, IV Location: R Antecubital  Vyepti (Eptinezumab-jjmr), Dose: 100 mg  Infusion Start Time: 1504  Infusion Stop Time: 1543  Post Infusion IV Care: Peripheral IV Discontinued  Discharge: Condition: Good, Destination: Home . AVS Provided  Performed by:  Natividad Balding, RN

## 2024-03-22 ENCOUNTER — Ambulatory Visit: Admitting: Neurology

## 2024-03-24 ENCOUNTER — Other Ambulatory Visit: Payer: Self-pay | Admitting: Family Medicine

## 2024-03-24 DIAGNOSIS — F419 Anxiety disorder, unspecified: Secondary | ICD-10-CM

## 2024-03-24 NOTE — Telephone Encounter (Signed)
 Copied from CRM [id number]. Topic: Clinical - Medication Refill >> Mar 24, 2024 11:14 AM Lorrine Rotunda wrote: Most Recent Primary Care Visit:  Provider: Dewayne Ford  Department: [facility]  Visit Type: MEDICARE AWV, SEQUENTIAL  Date: 02/01/2024  Medication: ALPRAZolam  (XANAX ) 1 MG tablet    Has the patient contacted their pharmacy? Yes (Agent: If no, request that the patient contact the pharmacy for the refill. If patient does not wish to contact the pharmacy document the reason why and proceed with request.) (Agent: If yes, when and what did the pharmacy advise?)  Is this the correct pharmacy for this prescription? Yes If no, delete pharmacy and type the correct one.  This is the patient's preferred pharmacy:  [facility] [address], [place] - [address] [place] [address] Phone: [phone] Fax: [phone]  [facility] - [place], [place] - [address] [place] [address] Phone: [phone] Fax: [phone]   Has the prescription been filled recently? Yes  Is the patient out of the medication? Yes  Has the patient been seen for an appointment in the last year OR does the patient have an upcoming appointment? Yes  Can we respond through MyChart? Yes  Agent: Please be advised that Rx refills may take up to 3 business days. We ask that you follow-up with your pharmacy.

## 2024-03-27 NOTE — Telephone Encounter (Signed)
 Requesting: Xanax  Contract: 2023 UDS: 2023 Last OV: 06/21/2023 Next OV: n/a Last Refill: 02/09/2024, #30--0 RF Database:   Please advise

## 2024-04-03 ENCOUNTER — Other Ambulatory Visit: Payer: Self-pay | Admitting: Neurology

## 2024-04-05 ENCOUNTER — Encounter: Payer: Self-pay | Admitting: Pulmonary Disease

## 2024-04-05 ENCOUNTER — Ambulatory Visit

## 2024-04-05 ENCOUNTER — Ambulatory Visit: Admitting: Pulmonary Disease

## 2024-04-05 VITALS — BP 110/78 | HR 82 | Ht 62.0 in | Wt 182.6 lb

## 2024-04-05 DIAGNOSIS — J0140 Acute pansinusitis, unspecified: Secondary | ICD-10-CM

## 2024-04-05 DIAGNOSIS — R053 Chronic cough: Secondary | ICD-10-CM

## 2024-04-05 DIAGNOSIS — R059 Cough, unspecified: Secondary | ICD-10-CM | POA: Diagnosis not present

## 2024-04-05 NOTE — Patient Instructions (Signed)
 I am sorry your cough is unchanged  Lets get a chest x-ray to make they are things look okay  We should consider inhalers in the future  I agree that the rash looks like vasculitis to me.  I am glad you have a dermatology appointment, I think a biopsy is needed.  Return to clinic in 3 months or sooner as needed with Dr. Marygrace Snellen

## 2024-04-05 NOTE — Progress Notes (Signed)
 @Patient  ID: Ebony Garcia, female    DOB: Oct 07, 1951, 73 y.o.   MRN: [id number]  Chief Complaint  Patient presents with   Follow-up    Referring provider: Estill Hemming, *  HPI:   73 y.o. woman whom we are seeing in consultation for evaluation of chronic cough.  PCP note x2 reviewed.  Cough unchanged, no better.  Present for years and years.  We tried nasal regimen, to help postnasal drip.  Did not improve.  We discussed possible cough variant asthma.  She denies GERD symptoms.  She has a rash on the foot looks like vasculitis.  This is taking precedent for now.  HPI at initial visit: Patient reports several history of cough.  Daily.  Worse over the last 3 to 4 months or so.  Often dry.  Occasional bouts of scant sputum.  No times a day with things are better or worse.  No position that makes things better or worse.  No environmental seasonal factors she can identify that make things better or worse.  No real alleviating or exacerbating factors.  Recently prescribed Tessalon  Perles without relief.  Recently treated for sinus infection x2 with 2 different antibiotics per PCP.  This did help with sinus pressure, mild improvement in congestion but no change in the cough.  She endorses some atopic symptoms, seasonal allergies.  No rashes.  She describes intense bandlike pressure and pain sometimes in the evenings that I perceive as being GERD.  She thinks this is anxiety as she has had a lot of grief recently.  Review chest x-ray 10/20/2021 on my review interpretation reveals clear lungs bilaterally.  PMH: Seasonal allergies, anxiety, hypertension Surgical history: Hysterectomy, breast surgery, nasal septum surgery Family history: Mother was diagnosed with likely lung cancer, heart failure, A. fib, father with bladder cancer Social history: Never smoker, lives in [place], estranged from children   Questionaires / Pulmonary Flowsheets:   ACT:      No data to display           MMRC:     No data to display          Epworth:      No data to display          Tests:   FENO:  No results found for: "NITRICOXIDE"  PFT:     No data to display          WALK:      No data to display          Imaging: Personally reviewed as per EMR discussion this note No results found.  Lab Results: Personally reviewed CBC    Component Value Date/Time   WBC 6.3 02/02/2024 1316   WBC 6.5 06/21/2023 1447   RBC 4.60 02/02/2024 1316   HGB 13.8 02/02/2024 1316   HGB 14.4 11/11/2017 1339   HCT 43.3 02/02/2024 1316   HCT 44.0 11/11/2017 1339   PLT 73 (L) 02/02/2024 1316   PLT 105 Platelet count consistent in citrate (L) 11/11/2017 1339   MCV 94.1 02/02/2024 1316   MCV 94 11/11/2017 1339   MCH 30.0 02/02/2024 1316   MCHC 31.9 02/02/2024 1316   RDW 13.2 02/02/2024 1316   RDW 13.6 11/11/2017 1339   LYMPHSABS 1.5 02/02/2024 1316   LYMPHSABS 1.6 11/11/2017 1339   MONOABS 0.3 02/02/2024 1316   EOSABS 0.2 02/02/2024 1316   EOSABS 0.2 11/11/2017 1339   BASOSABS 0.1 02/02/2024 1316   BASOSABS 0.0 11/11/2017 1339  BMET    Component Value Date/Time   NA 140 02/02/2024 1316   NA 143 11/11/2017 1339   K 4.0 02/02/2024 1316   K 3.6 11/11/2017 1339   CL 105 02/02/2024 1316   CL 109 (H) 11/11/2017 1339   CO2 27 02/02/2024 1316   CO2 24 11/11/2017 1339   GLUCOSE 166 (H) 02/02/2024 1316   GLUCOSE 132 (H) 11/11/2017 1339   BUN 15 02/02/2024 1316   BUN 9 11/11/2017 1339   CREATININE 0.99 02/02/2024 1316   CREATININE 1.00 (H) 10/18/2020 1433   CALCIUM 9.6 02/02/2024 1316   CALCIUM 9.3 11/11/2017 1339   GFRNONAA >60 02/02/2024 1316   GFRAA >60 07/01/2020 1337    BNP    Component Value Date/Time   BNP 74.7 11/03/2016 1810    ProBNP    Component Value Date/Time   PROBNP 40.0 05/10/2013 1627    Specialty Problems       Pulmonary Problems   Acute non-recurrent pansinusitis   Chronic cough    Allergies  Allergen Reactions    Amoxicillin-Pot Clavulanate Diarrhea and Other (See Comments)   Antihistamines, Diphenhydramine -Type Other (See Comments)    Other Reaction: makes pt nervous   Diphenhydramine  Hcl Other (See Comments)    Other Reaction: makes pt nervous   Fremanezumab -Vfrm Rash and Other (See Comments)    Seizure, jerking   Clindamycin Other (See Comments)    She has not taken but per patient  "her mother had a reaction and she does not want it ever"   Hydrocodone -Acetaminophen  Other (See Comments)    REACTION: sensitive to  but can take for extreme pain per patient   Lisinopril      Restless , irregular sleep   Nortriptyline  Other (See Comments)    UNKNOWN REACTION   Other Other (See Comments)   Propoxyphene N-Acetaminophen  Nausea Only   Sulfa Antibiotics Other (See Comments)    Unknown reaction   Tramadol  Other (See Comments)    UNKNOWN REACTION   Tramadol  Hcl Other (See Comments)    Immunization History  Administered Date(s) Administered   Fluad Quad(high Dose 65+) 08/03/2019, 11/04/2021, 11/05/2022   Hepatitis B, ADULT 07/30/2016, 09/29/2016   Influenza Split 09/19/2012   Influenza Whole 11/08/2009   Influenza,inj,Quad PF,6+ Mos 11/07/2013, 07/30/2016, 09/20/2017, 09/14/2018   Influenza-Unspecified 09/11/2015, 10/08/2023   Moderna Sars-Covid-2 Vaccination 02/16/2020, 03/18/2020, 10/04/2020   PPD Test 07/30/2016, 11/03/2017   Pneumococcal Conjugate-13 08/03/2019   Td 11/08/2009   Zoster Recombinant(Shingrix) 01/17/2022    Past Medical History:  Diagnosis Date   Acute diastolic CHF (congestive heart failure) (HCC) 04/30/2013   5/24-27/14 acute diastolic heart failure  BNP 1528 2 D echocardiogram essentially negative  Chest x-ray revealed cardiomegaly with costophrenic angle blunting and suggestion of possible interstitial vascular accentuation; no frank heart failure present. 10 pound weight loss with 20 mg of furosemide  daily  No definite etiology of the cardiomegaly or acute diastolic  failure    Anxiety 02/07/2015   Auditory hallucination 10/06/2017   B12 deficiency 10/07/2018   Bradycardia 04/30/2013   Cancer (HCC)    Chest pain 05/14/2020   Chest pain with moderate risk of acute coronary syndrome 02/07/2015   CHF (congestive heart failure) (HCC)    dystolic heart dysfunction   Chronic kidney disease    De Quervain's tenosynovitis, left 05/23/2013   Depression    Depression with anxiety    Depressive disorder, not elsewhere classified 05/31/2007   Centricity Description: DISORDER, DEPRESSIVE NEC Qualifier: Diagnosis of  By: Donnice Gale MD,  Dorothye Gathers, [facility]    Diabetes mellitus without complication [facility])    Diet controlled   Dysmetabolic syndrome X 02/27/2008   Qualifier: Diagnosis of  By: Donnice Gale MD, William  A1c 6.3% in 2/12; TG 278    Essential hypertension 08/09/2008   Qualifier: Diagnosis of  By: Donnice Gale MD, William     Estrogen deficiency 02/02/2019   Estrogen deficiency 02/02/2019   Fatigue 02/02/2019   Hair loss 02/02/2019   Hyperlipidemia 02/27/2008   Qualifier: Diagnosis of  By: Donnice Gale MD, William     Hypertension    Intertrigo    INTERTRIGO 11/08/2009   Qualifier: Diagnosis of  By: Fleming Hum FNP, Melissa S    Intractable migraine with aura without status migrainosus 08/03/2018   Migraine variant 08/15/2008   Qualifier: Diagnosis of  By: Donnice Gale MD, William   Onset:initially @ age 46;recurrence age 13 during 1st trimester. Recurrence @ 39. No prodrome or aura Previously on Tryptans & Topamax  with temporary relief but tolerance appeared Botox by Dr Laray Platt 09/2010  most effective     Migraines    Myalgia 02/02/2019   NONSPECIFIC ABNORMAL ELECTROCARDIOGRAM 08/15/2008   Qualifier: Diagnosis of  By: Donnice Gale MD, William     Palpitation 04/17/2014   Panic attacks    Panic attacks    Polydipsia 11/02/2007   Qualifier: Diagnosis of  By: Donnice Gale MD, William     RENAL CALCULUS 02/21/2007   Qualifier: Diagnosis of   By: Nilsa Bash     Squamous cell carcinoma in situ of skin 12/26/2015   Thrombocytopenia (HCC) 10/06/2017   Viral syndrome 04/10/2019   WEIGHT GAIN 11/02/2007   Qualifier: Diagnosis of  By: Donnice Gale MD, Sammie Crigler      Tobacco History: Social History   Tobacco Use  Smoking Status Never   Passive exposure: Past  Smokeless Tobacco Never   Counseling given: Not Answered   Continue to not smoke  Outpatient Encounter Medications as of 04/05/2024  Medication Sig   ALPRAZolam  (XANAX ) 1 MG tablet TAKE 1 TABLET BY MOUTH EVERY DAY AT BEDTIME AS NEEDED FOR SLEEP   amLODipine  (NORVASC ) 5 MG tablet TAKE 1 TABLET EVERY DAY   aspirin  EC 81 MG tablet Take 1 tablet (81 mg total) by mouth daily. Swallow whole.   Eptinezumab -jjmr (VYEPTI ) 100 MG/ML injection Infusion q12 weeks   FLUoxetine  (PROZAC ) 20 MG capsule TAKE 1 CAPSULE EVERY DAY   furosemide  (LASIX ) 20 MG tablet TAKE 1 TABLET EVERY DAY   metoCLOPramide  (REGLAN ) 10 MG tablet Take 1 tablet (10 mg total) by mouth every 6 (six) hours as needed for nausea.   topiramate  (TOPAMAX ) 25 MG tablet TAKE 1 TABLET AT BEDTIME   triamcinolone ointment (KENALOG) 0.1 % Apply topically as needed   [DISCONTINUED] Vitamin D , Ergocalciferol , (DRISDOL ) 1.25 MG (50000 UNIT) CAPS capsule Take 1 capsule (50,000 Units total) by mouth every 7 (seven) days.   No facility-administered encounter medications on file as of 04/05/2024.     Review of Systems  Review of Systems  N/a Physical Exam  BP 110/78 (BP Location: Left Arm, Patient Position: Sitting, Cuff Size: Normal)   Pulse 82   Ht 5\' 2"  (1.575 m)   Wt 182 lb 9.6 oz (82.8 kg)   SpO2 98%   BMI 33.40 kg/m   Wt Readings from Last 5 Encounters:  04/05/24 182 lb 9.6 oz (82.8 kg)  03/20/24 181 lb 3.2 oz (82.2 kg)  02/02/24 182 lb (82.6 kg)  02/01/24 178 lb (80.7  kg)  12/21/23 180 lb 12.8 oz (82 kg)    BMI Readings from Last 5 Encounters:  04/05/24 33.40 kg/m  03/20/24 33.14 kg/m  02/02/24 33.29  kg/m  02/01/24 32.56 kg/m  12/21/23 33.07 kg/m     Physical Exam General: Well-appearing, no acute distress Eyes: EOMI, icterus Neck: Supple, no JVP Pulmonary: Clear, no work of breathing Cardiovascular: Regular in rhythm, no murmur Abdomen: Nondistended, bowel sounds present MSK: No synovitis, no joint effusion Neuro: Normal gait, no weakness Psych: Normal mood, full affect Skin: Scattered petechial rash bilateral shins anteriorly as well as top of foot, nonblanching.  Assessment & Plan:   Chronic cough: Present for many years.  Likely multifactorial.  No clear driver.  She has longstanding what she describes as sinus disease, sinus congestion.  No better after trying nasal regimen.  Consider ENT referral.  She denies GERD symptoms but this is possible, silent reflux.  Lastly discussed trying inhalers to treat cough.  Asthma.  She is precontemplative.  Will repeat chest x-ray today to make sure no changes on imaging that would raise concern.  Petechial rash: Nonblanching, petechial and purpuric.  Suspicious for vasculitis.  Recommended dermatology for biopsy.  He has upcoming appointment.  Return in about 3 months (around 07/05/2024) for f/u Dr. Marygrace Snellen.   Guerry Leek, MD 04/05/2024  I spent 32 minutes in the care of the patient including face-to-face visit, review of records, coordination of care.

## 2024-04-06 ENCOUNTER — Encounter: Payer: Self-pay | Admitting: Family Medicine

## 2024-04-10 NOTE — Progress Notes (Unsigned)
 NEUROLOGY FOLLOW UP OFFICE NOTE  Ebony Garcia [id number]  Assessment/Plan:   1  Migraine without aura, without status migrainosus, not intractable - doing well on Vyepti  2  Tinnitus       Migraine prevention:  Vyepti  100mg  every 3 months; discontinue topiramate . *** Limit use of pain relievers to no more than 2 days out of week to prevent risk of rebound or medication-overuse headache. Keep headache diary Follow up 6 months     Subjective:  Ebony Garcia is a 73 year old right-handed woman with hypertension, CHF, B12 deficiency, depression and hyperlipidemia who follows up for migraines.   UPDATE Decreased topiramate  to 25mg  at bedtime. Still no migraines since April 2023.  *** Still notes knocking and sometimes clapping in her head.  Only when laying down.  She does have high-tone tinnitus and pulsatile tinnitus.      Current NSAIDS:  ASA 81mg  Current analgesics:  no. Current triptans:   none Current anti-emetic:  no Current muscle relaxants:  no Current anti-anxiolytic:  Xanax  1mg  Current sleep aide:  Xanax  1mg  Current Antihypertensive medications:  Lasix  Current Antidepressant medications:  Prozac  20mg  Current Anticonvulsant medications:  topiramate  25mg  BID Current anti-CGRP:  Vyepti  100mg     Depression and anxiety:  stable She reports off and on body aches, left hip pain Reports drinking 64 oz water daily    HISTORY: Onset:  Since her late 30s. Location: usually left sided, periorbital, and back of head.  More recently, holocephalic Quality:  Sharp, aching pressure.  Non-throbbing Initial intensity:  7/10; Sept: 7/10 Associated symptoms:  Nausea, photophobia and phonophobia.  No visual disturbance, osmophobia, vomiting or unilateral numbness or weakness Aura:  no Initial duration:  3 days; Sept: 30 minutes with Maxalt  Initial frequency:  25 headache days per month Triggers:  Emotional stress Relieving factors:  Cold mask, heating pack on neck and  shoulders   Past NSAIDs:  naproxen (ineffective), indomethacin , toradol  tablet (ineffective), Toradol  60mg  IM (effective), Cambia (side effects) Past analgesics:  Tylenol  (ineffective), Excedrin (ineffective), Lidocaine nasal drops (effective but caused hives), tramadol  (side effects), Midrin (effective but made her sleepy) Past triptans/ergots:  sumatriptan  100mg , Zomig  5mg  (effective), Frova, Relpax, DHE (effective), Maxalt  (effective but unable to afford it), Tosymra  NS, Zomig  5mg  NS Past antiemetic:  Promethazine , Zofran , Reglan  Past anxiolytics:  Vicodin Past muscle relaxants:  cyclobenzaprine  Past antihypertensives:  propranolol  (ineffective), atenolol  (ineffective), lisinopril , metoprolol , HCTZ, losartan  Past antidepressants:  amitriptyline (side effects), venlafaxine (ineffective), nortriptyline  (effective but side effects such as rash), imipramine Past antiepileptics:  Depakote (hair loss), lamotrigine  (side effects), zonisamide  (side effects), gabapentin , Keppra Past CGRP inhibitor:  Aimovig (effective but concerned about side effects- ankle jerking, abnormal skin pattern), Ajovy  (same side effects); Emgality  (cost) Past vitamins and supplements:  magnesium (ineffective), feverfew (ineffective) Other past therapy:  Botox (one round, flu like symptoms, unable to afford 20% copay), occipital nerve blocks, trigger point injections, alternating heat/ice   Family history of headache:  Mother, grandmother, great-aunt, cousins.   08/16/08 MRI Brain w/wo performed for "explosive headaches": nonspecific punctate hyperintensities in the subcortical white matter. 08/16/08 MRA Head: Unremarkable.  There is mild attenuation in the vertebrobasilar junction and proximal basilar artery which is likely artifact. 07/05/19 CTA of Head:  Unremarkable for mass lesion, high-grade arterial stenosis, aneurysm or other acute intracranial abnormality.  PAST MEDICAL HISTORY: Past Medical History:  Diagnosis Date    Acute diastolic CHF (congestive heart failure) (HCC) 04/30/2013   5/24-27/14 acute diastolic heart failure  BNP 1528 2 D echocardiogram  essentially negative  Chest x-ray revealed cardiomegaly with costophrenic angle blunting and suggestion of possible interstitial vascular accentuation; no frank heart failure present. 10 pound weight loss with 20 mg of furosemide  daily  No definite etiology of the cardiomegaly or acute diastolic failure    Anxiety 02/07/2015   Auditory hallucination 10/06/2017   B12 deficiency 10/07/2018   Bradycardia 04/30/2013   Cancer (HCC)    Chest pain 05/14/2020   Chest pain with moderate risk of acute coronary syndrome 02/07/2015   CHF (congestive heart failure) (HCC)    dystolic heart dysfunction   Chronic kidney disease    De Quervain's tenosynovitis, left 05/23/2013   Depression    Depression with anxiety    Depressive disorder, not elsewhere classified 05/31/2007   Centricity Description: DISORDER, DEPRESSIVE NEC Qualifier: Diagnosis of  By: Donnice Gale MD, Dorothye Gathers, [facility]    Diabetes mellitus without complication [facility])    Diet controlled   Dysmetabolic syndrome X 02/27/2008   Qualifier: Diagnosis of  By: Donnice Gale MD, William  A1c 6.3% in 2/12; TG 278    Essential hypertension 08/09/2008   Qualifier: Diagnosis of  By: Donnice Gale MD, William     Estrogen deficiency 02/02/2019   Estrogen deficiency 02/02/2019   Fatigue 02/02/2019   Hair loss 02/02/2019   Hyperlipidemia 02/27/2008   Qualifier: Diagnosis of  By: Donnice Gale MD, William     Hypertension    Intertrigo    INTERTRIGO 11/08/2009   Qualifier: Diagnosis of  By: Fleming Hum FNP, Melissa S    Intractable migraine with aura without status migrainosus 08/03/2018   Migraine variant 08/15/2008   Qualifier: Diagnosis of  By: Donnice Gale MD, William   Onset:initially @ age 50;recurrence age 42 during 1st trimester. Recurrence @ 39. No prodrome or aura Previously on Tryptans &  Topamax  with temporary relief but tolerance appeared Botox by Dr Laray Platt 09/2010  most effective     Migraines    Myalgia 02/02/2019   NONSPECIFIC ABNORMAL ELECTROCARDIOGRAM 08/15/2008   Qualifier: Diagnosis of  By: Donnice Gale MD, William     Palpitation 04/17/2014   Panic attacks    Panic attacks    Polydipsia 11/02/2007   Qualifier: Diagnosis of  By: Donnice Gale MD, William     RENAL CALCULUS 02/21/2007   Qualifier: Diagnosis of  By: Nilsa Bash     Squamous cell carcinoma in situ of skin 12/26/2015   Thrombocytopenia (HCC) 10/06/2017   Viral syndrome 04/10/2019   WEIGHT GAIN 11/02/2007   Qualifier: Diagnosis of  By: Donnice Gale MD, Sammie Crigler      MEDICATIONS: Current Outpatient Medications on File Prior to Visit  Medication Sig Dispense Refill   ALPRAZolam  (XANAX ) 1 MG tablet TAKE 1 TABLET BY MOUTH EVERY DAY AT BEDTIME AS NEEDED FOR SLEEP 30 tablet 0   amLODipine  (NORVASC ) 5 MG tablet TAKE 1 TABLET EVERY DAY 90 tablet 3   aspirin  EC 81 MG tablet Take 1 tablet (81 mg total) by mouth daily. Swallow whole. 30 tablet 11   Eptinezumab -jjmr (VYEPTI ) 100 MG/ML injection Infusion q12 weeks 1.12 mL 0   FLUoxetine  (PROZAC ) 20 MG capsule TAKE 1 CAPSULE EVERY DAY 90 capsule 3   furosemide  (LASIX ) 20 MG tablet TAKE 1 TABLET EVERY DAY 90 tablet 3   metoCLOPramide  (REGLAN ) 10 MG tablet Take 1 tablet (10 mg total) by mouth every 6 (six) hours as needed for nausea. 20 tablet 5   topiramate  (TOPAMAX ) 25 MG tablet TAKE 1 TABLET AT BEDTIME 90 tablet 0  triamcinolone ointment (KENALOG) 0.1 % Apply topically as needed     No current facility-administered medications on file prior to visit.     ALLERGIES: Allergies  Allergen Reactions   Amoxicillin-Pot Clavulanate Diarrhea and Other (See Comments)   Antihistamines, Diphenhydramine -Type Other (See Comments)    Other Reaction: makes pt nervous   Diphenhydramine  Hcl Other (See Comments)    Other Reaction: makes pt nervous   Fremanezumab -Vfrm Rash and  Other (See Comments)    Seizure, jerking   Clindamycin Other (See Comments)    She has not taken but per patient  "her mother had a reaction and she does not want it ever"   Hydrocodone -Acetaminophen  Other (See Comments)    REACTION: sensitive to  but can take for extreme pain per patient   Lisinopril      Restless , irregular sleep   Nortriptyline  Other (See Comments)    UNKNOWN REACTION   Other Other (See Comments)   Propoxyphene N-Acetaminophen  Nausea Only   Sulfa Antibiotics Other (See Comments)    Unknown reaction   Tramadol  Other (See Comments)    UNKNOWN REACTION   Tramadol  Hcl Other (See Comments)    FAMILY HISTORY: Family History  Problem Relation Age of Onset   Crohn's disease Mother    Atrial fibrillation Mother    Heart failure Mother    Bladder Cancer Father    Cancer Father        bladder   Bipolar disorder Brother    Cancer Brother    Bipolar disorder Sister    Heart attack Maternal Grandmother        >65   Cancer Maternal Aunt    Colon cancer Maternal Uncle    Cancer Paternal Grandfather    Cancer Maternal Aunt    Cancer Cousin    Anemia Cousin    Diabetes Neg Hx    Stroke Neg Hx       Objective:  *** General: No acute distress.  Patient appears well-groomed.   Head:  Normocephalic/atraumatic Neck:  Supple.  No paraspinal tenderness.  Full range of motion. Heart:  Regular rate and rhythm. Neuro:  Alert and oriented.  Speech fluent and not dysarthric.  Language intact.  CN II-XII intact.  Bulk and tone normal.  Muscle strength 5/5 throughout.  Deep tendon reflexes 2+ throughout.  Gait normal.  Romberg negative.   Janne Members, DO  CC: Roel Clarity, DO

## 2024-04-11 ENCOUNTER — Encounter: Payer: Self-pay | Admitting: Neurology

## 2024-04-11 ENCOUNTER — Ambulatory Visit: Admitting: Neurology

## 2024-04-11 VITALS — BP 100/70 | Ht 62.0 in | Wt 182.0 lb

## 2024-04-11 DIAGNOSIS — R200 Anesthesia of skin: Secondary | ICD-10-CM

## 2024-04-11 DIAGNOSIS — G43009 Migraine without aura, not intractable, without status migrainosus: Secondary | ICD-10-CM

## 2024-04-11 DIAGNOSIS — R202 Paresthesia of skin: Secondary | ICD-10-CM

## 2024-04-11 DIAGNOSIS — R2681 Unsteadiness on feet: Secondary | ICD-10-CM | POA: Diagnosis not present

## 2024-04-11 NOTE — Patient Instructions (Signed)
 Stop topiramate . Continue Vyepti  Nerve conduction study of both legs Follow up 6 months.

## 2024-04-14 ENCOUNTER — Encounter: Payer: Self-pay | Admitting: Family Medicine

## 2024-04-14 ENCOUNTER — Ambulatory Visit (INDEPENDENT_AMBULATORY_CARE_PROVIDER_SITE_OTHER): Admitting: Family Medicine

## 2024-04-14 VITALS — BP 118/70 | HR 92 | Temp 98.2°F | Resp 18 | Ht 62.0 in | Wt 180.4 lb

## 2024-04-14 DIAGNOSIS — R21 Rash and other nonspecific skin eruption: Secondary | ICD-10-CM | POA: Diagnosis not present

## 2024-04-14 DIAGNOSIS — L308 Other specified dermatitis: Secondary | ICD-10-CM | POA: Diagnosis not present

## 2024-04-14 NOTE — Progress Notes (Signed)
 Discussed the use of AI scribe software for clinical note transcription with the patient, who gave verbal consent to proceed.  History of Present Illness Ebony Garcia is a 73 year old female who presents with a suspected case of vasculitis affecting her feet and ankles.  She has been experiencing symptoms in her left foot, which is the most severely affected, for almost seven months. Both ankles are involved, particularly the posterior aspect, and the rash does not blanch. She has not yet seen a dermatologist but has an appointment scheduled for June 12th.  The symptoms intensify after prolonged standing or walking, such as when she took her mother to get a perm. Elevating her legs and staying off her feet help alleviate the symptoms. She is considering a biopsy due to the persistent nature of her symptoms, although she has concerns about bleeding because of her history of low platelets.  She has a history of low platelets and a previous squamous cell carcinoma on her left leg. She is concerned about bleeding during a biopsy procedure due to her low platelet count.  She is actively involved in the care of her mother, who has heart issues and is on Lasix , resulting in significant weight loss for her mother. She is the power of attorney for her mother and is actively involved in her care.  The affected areas can be bothersome and itchy at times, particularly at night, which affects her sleep. She finds that using alcohol helps alleviate some of the discomfort.     Punch Biopsy Procedure Note+  Pre-operative Diagnosis: vaculitis   Post-operative Diagnosis: same  Locations:medial leg  Indications: chronic   Anesthesia: Lidocaine 1% with epinephrine without added sodium bicarbonate  Procedure Details  History of allergy to iodine: no Patient informed of the risks (including bleeding and infection) and benefits of the  procedure and Verbal informed consent obtained.  The lesion and  surrounding area was given a sterile prep using alcohol and draped in the usual sterile fashion. The skin was then stretched perpendicular to the skin tension lines and the lesion removed using the 2lfomm punch. The resulting ellipse was then closed. The wound was closed with 3-0 Prolene using simple interrupted stitches. Antibiotic ointment and a sterile dressing applied. The specimen was sent for pathologic examination. The patient tolerated the procedure well.  EBL: 1 ml  Findings: ? vasculitis  Condition: Stable  Complications: none.  Plan: 1. Instructed to keep the wound dry and covered for 24-48h and clean thereafter. 2. Warning signs of infection were reviewed.   3. Recommended that the patient use OTC acetaminophen  as needed for pain.  4. Return for suture removal in 5 days.  Assessment and Plan Assessment & Plan Vasculitis   Chronic vasculitis affects both ankles, with the left foot being the most severe. Symptoms have persisted for almost seven months and do not blanch as expected. Severity increases with prolonged standing or walking. Although other causes of skin lesions are considered, vasculitis is suspected by the pulmonologist. A punch biopsy is recommended to confirm the diagnosis, with informed consent obtained, including discussion of risks such as bleeding due to low platelets and the need for suture removal. Perform a punch biopsy on the left leg and suture the site with one stitch. Advise her to keep the biopsy site dry for 24 hours and avoid soaking it in water. She should return for stitch removal in five days and keep the dermatology appointment on June 12th.  Squamous cell carcinoma  There is a scar on the left leg from previous treatment for squamous cell carcinoma. No current issues are related to squamous cell carcinoma.  Keep derm appointment

## 2024-04-14 NOTE — Patient Instructions (Signed)
Skin Biopsy A skin biopsy is a procedure to remove a sample of your skin so that it can be checked for any disease. You may need a skin biopsy if you have a skin disease or abnormal changes on your skin (lesion). Tell a health care provider about: Any allergies you have. All medicines you are taking, including vitamins, herbs, eye drops, creams, and over-the-counter medicines. Any problems you or family members have had with anesthetic medicines. Any bleeding problems you have. Any surgeries you have had. Any medical conditions you have. Whether you are pregnant or may be pregnant. What are the risks? Generally, this is a safe procedure. However, problems may occur, including: Bleeding. Infection. Scarring. Allergic reaction to anesthetics, surgical materials, or ointments. What happens before the procedure? Medicines Ask your health care provider about: Changing or stopping your regular medicines. This is especially important if you are taking diabetes medicines or blood thinners. Taking medicines such as aspirin and ibuprofen. These medicines can thin your blood. Do not take these medicines unless your health care provider tells you to take them. Taking over-the-counter medicines, vitamins, herbs, and supplements. General instructions Follow instructions from your health care provider about eating or drinking restrictions. Ask your health care provider: How your surgery site will be marked. What steps will be taken to help prevent infection. These steps may include: Removing hair at the surgery site. Washing skin with a germ-killing soap. Taking antibiotic medicine. Ask your health care provider if you will need someone to take you home from the hospital or clinic after the procedure. What happens during the procedure?  You may be given medicine to numb the area (local anesthetic). Your health care provider will take a sample using one of these steps, depending on the type of skin  problem that you have: Shave biopsy. Your health care provider will shave away layers of your skin lesion with a sharp blade. After shaving, a gel or ointment may be used to control bleeding. Punch biopsy. Your health care provider will use a tool to remove all or part of the lesion. This leaves a small hole about the width of a pencil eraser. The area may be covered with a gel or ointment. Excisional or incisional biopsy. Your health care provider will use a surgical blade to remove all or part of your lesion. Your skin biopsy site may be closed with stitches (sutures). A bandage (dressing) will be applied. The procedure may vary among health care providers and hospitals. What happens after the procedure? Your skin sample will be sent to a lab for tests. Your skin biopsy site will be watched to make sure that it stops bleeding. You will be given instructions on how to care for your biopsy site. It is up to you to get the results of your procedure. Ask your health care provider, or the department that is doing the procedure, when your results will be ready. Summary A skin biopsy is a procedure to remove a sample of your skin (lesion) so that it can be checked under a microscope. Tell a health care provider about your medical history and all medicines you are taking, including vitamins, herbs, eye drops, creams, and over-the-counter medicines. Before the procedure, ask your health care provider about changing or stopping your regular medicines. During the procedure, your health care provider will take a skin sample from the area where you have the skin problem. After the procedure, your skin sample will be sent to a laboratory for testing. This  information is not intended to replace advice given to you by your health care provider. Make sure you discuss any questions you have with your health care provider. Document Revised: 06/24/2021 Document Reviewed: 06/24/2021 Elsevier Patient Education   2024 [employer].

## 2024-04-20 ENCOUNTER — Encounter: Payer: Self-pay | Admitting: Family Medicine

## 2024-04-20 ENCOUNTER — Ambulatory Visit (INDEPENDENT_AMBULATORY_CARE_PROVIDER_SITE_OTHER): Admitting: Family Medicine

## 2024-04-20 VITALS — BP 112/78 | HR 79 | Temp 98.0°F | Ht 62.0 in | Wt 184.6 lb

## 2024-04-20 DIAGNOSIS — Z4802 Encounter for removal of sutures: Secondary | ICD-10-CM

## 2024-04-21 DIAGNOSIS — Z4802 Encounter for removal of sutures: Secondary | ICD-10-CM | POA: Insufficient documentation

## 2024-04-21 LAB — SURGICAL PATHOLOGY

## 2024-04-21 NOTE — Progress Notes (Signed)
 Established Patient Office Visit  Subjective   Patient ID: Ebony Garcia, female    DOB: [birth year]  Age: 73 y.o. MRN: [id number]  Chief Complaint  Patient presents with   Suture / Staple Removal    HPI Pt is here for suture removal.  No complications/ complaints.  Patient Active Problem List   Diagnosis Date Noted   Visit for suture removal 04/21/2024   Controlled type 2 diabetes mellitus with complication, without long-term current use of insulin (HCC) 06/21/2023   Acute non-recurrent pansinusitis 10/20/2021   Chronic cough 10/20/2021   Acute low back pain 02/24/2021   Lower abdominal pain 02/24/2021   Panic attacks    Migraines    Intertrigo    Hypertension    Diet-controlled diabetes mellitus (HCC)    Depression    Chronic kidney disease    CHF (congestive heart failure) (HCC)    Cancer (HCC)    Chest pain 05/14/2020   Viral syndrome 04/10/2019   Myalgia 02/02/2019   Fatigue 02/02/2019   Estrogen deficiency 02/02/2019   Hair loss 02/02/2019   B12 deficiency 10/07/2018   Intractable migraine with aura without status migrainosus 08/03/2018   Thrombocytopenia (HCC) 10/06/2017   Auditory hallucination 10/06/2017   Depression with anxiety 05/14/2017   Squamous cell carcinoma in situ of skin 12/26/2015   Chest pain with moderate risk of acute coronary syndrome 02/07/2015   Anxiety 02/07/2015   Palpitation 04/17/2014   De Quervain's tenosynovitis, left 05/23/2013   Acute diastolic CHF (congestive heart failure) (HCC) 04/30/2013   Bradycardia 04/30/2013   INTERTRIGO 11/08/2009   Migraine variant 08/15/2008   NONSPECIFIC ABNORMAL ELECTROCARDIOGRAM 08/15/2008   Essential hypertension 08/09/2008   Hyperlipidemia 02/27/2008   Dysmetabolic syndrome X 02/27/2008   WEIGHT GAIN 11/02/2007   POLYDIPSIA 11/02/2007   Depressive disorder, not elsewhere classified 05/31/2007   RENAL CALCULUS 02/21/2007   Past Medical History:  Diagnosis Date   Acute diastolic CHF  (congestive heart failure) (HCC) 04/30/2013   5/24-27/14 acute diastolic heart failure  BNP 1528 2 D echocardiogram essentially negative  Chest x-ray revealed cardiomegaly with costophrenic angle blunting and suggestion of possible interstitial vascular accentuation; no frank heart failure present. 10 pound weight loss with 20 mg of furosemide  daily  No definite etiology of the cardiomegaly or acute diastolic failure    Anxiety 02/07/2015   Auditory hallucination 10/06/2017   B12 deficiency 10/07/2018   Bradycardia 04/30/2013   Cancer (HCC)    Chest pain 05/14/2020   Chest pain with moderate risk of acute coronary syndrome 02/07/2015   CHF (congestive heart failure) (HCC)    dystolic heart dysfunction   Chronic kidney disease    De Quervain's tenosynovitis, left 05/23/2013   Depression    Depression with anxiety    Depressive disorder, not elsewhere classified 05/31/2007   Centricity Description: DISORDER, DEPRESSIVE NEC Qualifier: Diagnosis of  By: Donnice Gale MD, Dorothye Gathers, [facility]    Diabetes mellitus without complication [facility])    Diet controlled   Dysmetabolic syndrome X 02/27/2008   Qualifier: Diagnosis of  By: Donnice Gale MD, William  A1c 6.3% in 2/12; TG 278    Essential hypertension 08/09/2008   Qualifier: Diagnosis of  By: Donnice Gale MD, William     Estrogen deficiency 02/02/2019   Estrogen deficiency 02/02/2019   Fatigue 02/02/2019   Hair loss 02/02/2019   Hyperlipidemia 02/27/2008   Qualifier: Diagnosis of  By: Donnice Gale MD, Sammie Crigler     Hypertension    Intertrigo  INTERTRIGO 11/08/2009   Qualifier: Diagnosis of  By: Fleming Hum FNP, Melissa S    Intractable migraine with aura without status migrainosus 08/03/2018   Migraine variant 08/15/2008   Qualifier: Diagnosis of  By: Donnice Gale MD, Sammie Crigler   Onset:initially @ age 80;recurrence age 15 during 1st trimester. Recurrence @ 39. No prodrome or aura Previously on Tryptans & Topamax  with temporary relief  but tolerance appeared Botox by Dr Laray Platt 09/2010  most effective     Migraines    Myalgia 02/02/2019   NONSPECIFIC ABNORMAL ELECTROCARDIOGRAM 08/15/2008   Qualifier: Diagnosis of  By: Donnice Gale MD, William     Palpitation 04/17/2014   Panic attacks    Panic attacks    Polydipsia 11/02/2007   Qualifier: Diagnosis of  By: Donnice Gale MD, William     RENAL CALCULUS 02/21/2007   Qualifier: Diagnosis of  By: Nilsa Bash     Squamous cell carcinoma in situ of skin 12/26/2015   Thrombocytopenia (HCC) 10/06/2017   Viral syndrome 04/10/2019   WEIGHT GAIN 11/02/2007   Qualifier: Diagnosis of  By: Donnice Gale MD, Sammie Crigler     Past Surgical History:  Procedure Laterality Date   ABDOMINAL HYSTERECTOMY     BREAST SURGERY     DILATION AND CURETTAGE OF UTERUS     NASAL SEPTUM SURGERY     papaloma Right    TONSILLECTOMY AND ADENOIDECTOMY     TOTAL ABDOMINAL HYSTERECTOMY W/ BILATERAL SALPINGOOPHORECTOMY     for fibroids and migraines   TUBAL LIGATION     Social History   Tobacco Use   Smoking status: Never    Passive exposure: Past   Smokeless tobacco: Never  Vaping Use   Vaping status: Never Used  Substance Use Topics   Alcohol use: No    Alcohol/week: 0.0 standard drinks of alcohol   Drug use: No   Social History   Socioeconomic History   Marital status: Divorced    Spouse name: Not on file   Number of children: 3   Years of education: Not on file   Highest education level: 12th grade  Occupational History    Employer: UNEMPLOYED  Tobacco Use   Smoking status: Never    Passive exposure: Past   Smokeless tobacco: Never  Vaping Use   Vaping status: Never Used  Substance and Sexual Activity   Alcohol use: No    Alcohol/week: 0.0 standard drinks of alcohol   Drug use: No   Sexual activity: Not Currently    Partners: Male  Other Topics Concern   Not on file  Social History Narrative   Lives alone.        Patient is right-handed. She lives on the first floor.      Drinks  caffeine   Social Drivers of Corporate investment banker Strain: High Risk (02/01/2024)   Overall Financial Resource Strain (CARDIA)    Difficulty of Paying Living Expenses: Hard  Food Insecurity: No Food Insecurity (02/01/2024)   Hunger Vital Sign    Worried About Running Out of Food in the Last Year: Never true    Ran Out of Food in the Last Year: Never true  Transportation Needs: No Transportation Needs (02/01/2024)   PRAPARE - Administrator, Civil Service (Medical): No    Lack of Transportation (Non-Medical): No  Physical Activity: Sufficiently Active (02/01/2024)   Exercise Vital Sign    Days of Exercise per Week: 3 days    Minutes of Exercise per Session: 90 min  Stress:  No Stress Concern Present (02/01/2024)   [employer] of Occupational Health - Occupational Stress Questionnaire    Feeling of Stress : Not at all  Social Connections: Unknown (02/01/2024)   Social Connection and Isolation Panel [NHANES]    Frequency of Communication with Friends and Family: More than three times a week    Frequency of Social Gatherings with Friends and Family: More than three times a week    Attends Religious Services: Patient declined    Database administrator or Organizations: Patient declined    Attends Banker Meetings: Not on file    Marital Status: Divorced  Intimate Partner Violence: Not At Risk (02/01/2024)   Humiliation, Afraid, Rape, and Kick questionnaire    Fear of Current or Ex-Partner: No    Emotionally Abused: No    Physically Abused: No    Sexually Abused: No   Family Status  Relation Name Status   Mother  Alive   Father  Alive   Brother  Deceased at age 20       renal cell carcinoma    Sister  Animator  (Not Specified)   Son  Alive   Son  Alive   Son  Alive   Mat Aunt  (Not Specified)   Nurse, mental health  (Not Specified)   PGF  (Not Specified)   Mat Aunt  (Not Specified)   Cousin  (Not Specified)   Cousin  (Not Specified)   Neg Hx  (Not  Specified)  No partnership data on file   Family History  Problem Relation Age of Onset   Crohn's disease Mother    Atrial fibrillation Mother    Heart failure Mother    Bladder Cancer Father    Cancer Father        bladder   Bipolar disorder Brother    Cancer Brother    Bipolar disorder Sister    Heart attack Maternal Grandmother        >65   Cancer Maternal Aunt    Colon cancer Maternal Uncle    Cancer Paternal Grandfather    Cancer Maternal Aunt    Cancer Cousin    Anemia Cousin    Diabetes Neg Hx    Stroke Neg Hx    Allergies  Allergen Reactions   Amoxicillin-Pot Clavulanate Diarrhea and Other (See Comments)   Antihistamines, Diphenhydramine -Type Other (See Comments)    Other Reaction: makes pt nervous   Diphenhydramine  Hcl Other (See Comments)    Other Reaction: makes pt nervous   Fremanezumab -Vfrm Rash and Other (See Comments)    Seizure, jerking   Clindamycin Other (See Comments)    She has not taken but per patient  "her mother had a reaction and she does not want it ever"   Hydrocodone -Acetaminophen  Other (See Comments)    REACTION: sensitive to  but can take for extreme pain per patient   Lisinopril      Restless , irregular sleep   Nortriptyline  Other (See Comments)    UNKNOWN REACTION   Other Other (See Comments)   Propoxyphene N-Acetaminophen  Nausea Only   Sulfa Antibiotics Other (See Comments)    Unknown reaction   Tramadol  Other (See Comments)    UNKNOWN REACTION   Tramadol  Hcl Other (See Comments)      Review of Systems  Constitutional:  Negative for chills, fever and malaise/fatigue.  HENT:  Negative for congestion and hearing loss.   Eyes:  Negative for blurred vision and discharge.  Respiratory:  Negative for cough, sputum production and shortness of breath.   Cardiovascular:  Negative for chest pain, palpitations and leg swelling.  Gastrointestinal:  Negative for abdominal pain, blood in stool, constipation, diarrhea, heartburn, nausea and  vomiting.  Genitourinary:  Negative for dysuria, frequency, hematuria and urgency.  Musculoskeletal:  Negative for back pain, falls and myalgias.  Skin:  Negative for rash.  Neurological:  Negative for dizziness, sensory change, loss of consciousness, weakness and headaches.  Endo/Heme/Allergies:  Negative for environmental allergies. Does not bruise/bleed easily.  Psychiatric/Behavioral:  Negative for depression and suicidal ideas. The patient is not nervous/anxious and does not have insomnia.       Objective:     BP 112/78 (BP Location: Left Arm, Patient Position: Sitting, Cuff Size: Large)   Pulse 79   Temp 98 F (36.7 C) (Oral)   Ht 5\' 2"  (1.575 m)   Wt 184 lb 9.6 oz (83.7 kg)   SpO2 96%   BMI 33.76 kg/m  BP Readings from Last 3 Encounters:  04/20/24 112/78  04/14/24 118/70  04/11/24 100/70   Wt Readings from Last 3 Encounters:  04/20/24 184 lb 9.6 oz (83.7 kg)  04/14/24 180 lb 6.4 oz (81.8 kg)  04/11/24 182 lb (82.6 kg)   SpO2 Readings from Last 3 Encounters:  04/20/24 96%  04/14/24 98%  04/11/24 98%      Physical Exam Vitals and nursing note reviewed.  Constitutional:      General: She is not in acute distress.    Appearance: Normal appearance. She is well-developed.  HENT:     Head: Normocephalic and atraumatic.  Eyes:     General: No scleral icterus.       Right eye: No discharge.        Left eye: No discharge.  Cardiovascular:     Rate and Rhythm: Normal rate and regular rhythm.     Heart sounds: No murmur heard. Pulmonary:     Effort: Pulmonary effort is normal. No respiratory distress.     Breath sounds: Normal breath sounds.  Musculoskeletal:        General: Normal range of motion.     Cervical back: Normal range of motion and neck supple.     Right lower leg: No edema.     Left lower leg: No edema.  Skin:    General: Skin is warm and dry.     Comments: Suture removed with no comlications No erythema or other signs of infection   Neurological:     General: No focal deficit present.     Mental Status: She is alert and oriented to person, place, and time.  Psychiatric:        Mood and Affect: Mood normal.        Behavior: Behavior normal.        Thought Content: Thought content normal.        Judgment: Judgment normal.      No results found for any visits on 04/20/24.  Last CBC Lab Results  Component Value Date   WBC 6.3 02/02/2024   HGB 13.8 02/02/2024   HCT 43.3 02/02/2024   MCV 94.1 02/02/2024   MCH 30.0 02/02/2024   RDW 13.2 02/02/2024   PLT 73 (L) 02/02/2024   Last metabolic panel Lab Results  Component Value Date   GLUCOSE 166 (H) 02/02/2024   NA 140 02/02/2024   K 4.0 02/02/2024   CL 105 02/02/2024   CO2 27 02/02/2024   BUN 15 02/02/2024  CREATININE 0.99 02/02/2024   GFRNONAA >60 02/02/2024   CALCIUM 9.6 02/02/2024   PROT 6.9 02/02/2024   ALBUMIN 4.7 02/02/2024   BILITOT 0.4 02/02/2024   ALKPHOS 89 02/02/2024   AST 16 02/02/2024   ALT 17 02/02/2024   ANIONGAP 8 02/02/2024   Last lipids Lab Results  Component Value Date   CHOL 196 06/21/2023   HDL 51.30 06/21/2023   LDLCALC 99 02/04/2023   LDLDIRECT 105.0 06/21/2023   TRIG 207.0 (H) 06/21/2023   CHOLHDL 4 06/21/2023   Last hemoglobin A1c Lab Results  Component Value Date   HGBA1C 5.8 06/21/2023   Last thyroid  functions Lab Results  Component Value Date   TSH 3.15 06/21/2023   Last vitamin D  Lab Results  Component Value Date   VD25OH 29.74 (L) 06/21/2023   Last vitamin B12 and Folate Lab Results  Component Value Date   VITAMINB12 400 02/02/2024      The 10-year ASCVD risk score (Arnett DK, et al., 2019) is: 18.2%    Assessment & Plan:   Problem List Items Addressed This Visit       Unprioritized   Visit for suture removal - Primary   Suture removed with pickups and forceps  No complications Bandaid placed  Pt had no questions/ concerns        No follow-ups on file.    Alysandra Lobue R Lowne Chase,  DO

## 2024-04-21 NOTE — Assessment & Plan Note (Signed)
 Suture removed with pickups and forceps  No complications Bandaid placed  Pt had no questions/ concerns

## 2024-04-22 ENCOUNTER — Encounter: Payer: Self-pay | Admitting: Family Medicine

## 2024-04-24 ENCOUNTER — Encounter: Payer: Self-pay | Admitting: Pulmonary Disease

## 2024-04-24 ENCOUNTER — Ambulatory Visit: Payer: Self-pay | Admitting: Family Medicine

## 2024-04-24 NOTE — Telephone Encounter (Signed)
Pt viewed via Mychart 

## 2024-04-24 NOTE — Telephone Encounter (Signed)
**Note De-identified  [place] Obfuscation** Please advise 

## 2024-04-26 ENCOUNTER — Inpatient Hospital Stay: Payer: Medicare HMO | Attending: Family

## 2024-04-26 VITALS — BP 122/80 | HR 65 | Temp 97.8°F

## 2024-04-26 DIAGNOSIS — D6959 Other secondary thrombocytopenia: Secondary | ICD-10-CM | POA: Insufficient documentation

## 2024-04-26 DIAGNOSIS — E538 Deficiency of other specified B group vitamins: Secondary | ICD-10-CM | POA: Insufficient documentation

## 2024-04-26 MED ORDER — CYANOCOBALAMIN 1000 MCG/ML IJ SOLN
1000.0000 ug | Freq: Once | INTRAMUSCULAR | Status: AC
Start: 2024-04-26 — End: 2024-04-26
  Administered 2024-04-26: 1000 ug via INTRAMUSCULAR
  Filled 2024-04-26: qty 1

## 2024-04-26 NOTE — Patient Instructions (Signed)
 Vitamin B12 Injection What is this medication? Vitamin B12 (VAHY tuh min B12) prevents and treats low vitamin B12 levels in your body. It is used in people who do not get enough vitamin B12 from their diet or when their digestive tract does not absorb enough. Vitamin B12 plays an important role in maintaining the health of your nervous system and red blood cells. This medicine may be used for other purposes; ask your health care provider or pharmacist if you have questions. COMMON BRAND NAME(S): B-12 Compliance Kit, B-12 Injection Kit, Cyomin, Dodex, LA-12, Nutri-Twelve, Physicians EZ Use B-12, Primabalt, Vitamin Deficiency Injectable System - B12 What should I tell my care team before I take this medication? They need to know if you have any of these conditions: Kidney disease Leber's disease Megaloblastic anemia An unusual or allergic reaction to cyanocobalamin, cobalt, other medications, foods, dyes, or preservatives Pregnant or trying to get pregnant Breast-feeding How should I use this medication? This medication is injected into a muscle or deeply under the skin. It is usually given in a clinic or care team's office. However, your care team may teach you how to inject yourself. Follow all instructions. Talk to your care team about the use of this medication in children. Special care may be needed. Overdosage: If you think you have taken too much of this medicine contact a poison control center or emergency room at once. NOTE: This medicine is only for you. Do not share this medicine with others. What if I miss a dose? If you are given your dose at a clinic or care team's office, call to reschedule your appointment. If you give your own injections, and you miss a dose, take it as soon as you can. If it is almost time for your next dose, take only that dose. Do not take double or extra doses. What may interact with this medication? Alcohol Colchicine This list may not describe all possible  interactions. Give your health care provider a list of all the medicines, herbs, non-prescription drugs, or dietary supplements you use. Also tell them if you smoke, drink alcohol, or use illegal drugs. Some items may interact with your medicine. What should I watch for while using this medication? Visit your care team regularly. You may need blood work done while you are taking this medication. You may need to follow a special diet. Talk to your care team. Limit your alcohol intake and avoid smoking to get the best benefit. What side effects may I notice from receiving this medication? Side effects that you should report to your care team as soon as possible: Allergic reactions--skin rash, itching, hives, swelling of the face, lips, tongue, or throat Swelling of the ankles, hands, or feet Trouble breathing Side effects that usually do not require medical attention (report to your care team if they continue or are bothersome): Diarrhea This list may not describe all possible side effects. Call your doctor for medical advice about side effects. You may report side effects to FDA at 1-800-FDA-1088. Where should I keep my medication? Keep out of the reach of children. Store at room temperature between 15 and 30 degrees C (59 and 85 degrees F). Protect from light. Throw away any unused medication after the expiration date. NOTE: This sheet is a summary. It may not cover all possible information. If you have questions about this medicine, talk to your doctor, pharmacist, or health care provider.  2024 Elsevier/Gold Standard (2021-08-05 00:00:00)

## 2024-05-01 ENCOUNTER — Other Ambulatory Visit: Payer: Self-pay | Admitting: Family Medicine

## 2024-05-01 DIAGNOSIS — F419 Anxiety disorder, unspecified: Secondary | ICD-10-CM

## 2024-05-02 NOTE — Telephone Encounter (Signed)
 Requesting: alprazolam  1mg   Contract: 05/05/22 UDS: 05/05/22 Last Visit: 04/14/24 Next Visit: None Last Refill: 03/27/24 #30 and 0RF   Please Advise

## 2024-05-09 ENCOUNTER — Encounter: Payer: Self-pay | Admitting: Family Medicine

## 2024-05-09 ENCOUNTER — Telehealth: Payer: Self-pay | Admitting: *Deleted

## 2024-05-09 DIAGNOSIS — R21 Rash and other nonspecific skin eruption: Secondary | ICD-10-CM

## 2024-05-09 NOTE — Telephone Encounter (Signed)
 Copied from CRM [id number]. Topic: Referral - Status >> May 09, 2024  1:05 PM Allyne Areola wrote: Reason for CRM: Patient is upset because she was referred to a dermatologist; however, was scheduled incorrectly. She would like for Roel Clarity to submit a referral for dermatology to [facility] Phone: [phone]. They will also need the results from a procedure done on 04/14/2024.

## 2024-05-09 NOTE — Telephone Encounter (Signed)
 Referral placed to requested Derm office

## 2024-05-12 ENCOUNTER — Encounter: Payer: Self-pay | Admitting: Neurology

## 2024-05-15 ENCOUNTER — Ambulatory Visit: Admitting: Neurology

## 2024-05-15 DIAGNOSIS — R200 Anesthesia of skin: Secondary | ICD-10-CM

## 2024-05-15 DIAGNOSIS — R2681 Unsteadiness on feet: Secondary | ICD-10-CM

## 2024-05-15 DIAGNOSIS — R202 Paresthesia of skin: Secondary | ICD-10-CM

## 2024-05-15 NOTE — Procedures (Signed)
 [facility]  [address] [place], [address]  [place], [place] [address] Tel: [phone] Fax: [phone] Test Date:  05/15/2024  Patient: Ebony Garcia DOB: 01-Nov-1951 Physician: Rommie Coats, MD  Sex: Female Height: 5\' 2"  Ref Phys: Janne Members, DO  ID#: [id number]   Technician:    History: This is a 73 year old female with burning and tingling in feet.  NCV & EMG Findings: Extensive electrodiagnostic evaluation of bilateral lower limbs shows: Bilateral sural and superficial peroneal/fibular sensory responses are within normal limits. Bilateral peroneal/fibular (EDB) and tibial (AH) motor responses are within normal limits. Bilateral H reflex latencies are within normal limits. There is no evidence of active or chronic motor axon loss changes affecting any of the tested muscles. Motor unit configuration and recruitment pattern is within normal limits.  Impression: This is a normal study. In particular, there is no electrodiagnostic evidence of a right or left lumbosacral (L3-S1) radiculopathy, large fiber sensorimotor neuropathy, or myopathy.    ___________________________ Rommie Coats, MD    Nerve Conduction Studies Motor Nerve Results    Latency Amplitude F-Lat Segment Distance CV Comment  Site (ms) Norm (mV) Norm (ms)  (cm) (m/s) Norm   Left Fibular (EDB) Motor  Ankle 2.5  < 6.0 4.3  > 2.5        Bel fib head 8.5 - 3.6 -  Bel fib head-Ankle 28 47  > 40   Pop fossa 10.3 - 3.5 -  Pop fossa-Bel fib head 10 56 -   Right Fibular (EDB) Motor  Ankle 2.3  < 6.0 5.7  > 2.5        Bel fib head 8.3 - 5.2 -  Bel fib head-Ankle 30 50  > 40   Pop fossa 9.9 - 5.1 -  Pop fossa-Bel fib head 9 56 -   Left Tibial (AH) Motor  Ankle 3.3  < 6.0 14.8  > 4.0        Knee 10.0 - 10.7 -  Knee-Ankle 36 54  > 40   Right Tibial (AH) Motor  Ankle 3.2  < 6.0 14.0  > 4.0        Knee 10.3 - 10.4 -  Knee-Ankle 37 52  > 40    Sensory Sites    Neg Peak Lat Amplitude (O-P) Segment  Distance Velocity Comment  Site (ms) Norm (V) Norm  (cm) (ms)   Left Superficial Fibular Sensory  14 cm-Ankle 2.6  < 4.6 7  > 3 14 cm-Ankle 14    Right Superficial Fibular Sensory  14 cm-Ankle 2.0  < 4.6 7  > 3 14 cm-Ankle 14    Left Sural Sensory  Calf-Lat mall 2.7  < 4.6 3  > 3 Calf-Lat mall 14    Right Sural Sensory  Calf-Lat mall 3.1  < 4.6 5  > 3 Calf-Lat mall 14     H-Reflex Results    M-Lat H Lat H Neg Amp H-M Lat  Site (ms) (ms) Norm (mV) (ms)  Left Tibial H-Reflex  Pop fossa 5.3 28.3  < 35.0 0.58 23.0  Right Tibial H-Reflex  Pop fossa 5.3 27.6  < 35.0 0.60 22.3   Electromyography   Side Muscle Ins.Act Fibs Fasc Recrt Amp Dur Poly Activation Comment  Left Tib ant Nml Nml Nml Nml Nml Nml Nml Nml N/A  Left Gastroc MH Nml Nml Nml Nml Nml Nml Nml Nml N/A  Left Vastus lat Nml Nml Nml Nml Nml Nml Nml Nml N/A  Left Biceps fem SH Nml Nml Nml Nml Nml Nml Nml Nml N/A  Left Gluteus med Nml Nml Nml Nml Nml Nml Nml Nml N/A  Right Tib ant Nml Nml Nml Nml Nml Nml Nml Nml N/A  Right Gastroc MH Nml Nml Nml Nml Nml Nml Nml Nml N/A  Right Vastus lat Nml Nml Nml Nml Nml Nml Nml Nml N/A  Right Biceps fem SH Nml Nml Nml Nml Nml Nml Nml Nml N/A  Right Gluteus med Nml Nml Nml Nml Nml Nml Nml Nml N/A      Waveforms:  Motor           Sensory           H-Reflex

## 2024-05-19 ENCOUNTER — Telehealth: Payer: Self-pay | Admitting: Neurology

## 2024-05-19 ENCOUNTER — Ambulatory Visit: Payer: Self-pay | Admitting: Neurology

## 2024-05-19 NOTE — Telephone Encounter (Signed)
 Patient advised of Dr.Jaffe note, The EMG wouldn't cause this. She may have another systemic condition going on. I would recommend going to Urgent Care.   Patient wants to wait it out and see if she get worse. And go then.

## 2024-05-19 NOTE — Telephone Encounter (Signed)
 Pt called and LM with AN. Did not want to talk with a nurse. She had EMG Monday. She is very sick, has a fever over 100, last night BP was 181/100 HR 88, 35 min later 177/100 HR 83. She has a bad HA and is miserable. Neck is very stiff. She hasn't been this sick in a very long time.. she can hardly walk, hips and legs hurt, she can't walk.

## 2024-05-19 NOTE — Progress Notes (Signed)
 Patient advised.Patient declined blood work at this time.

## 2024-05-20 ENCOUNTER — Encounter: Payer: Self-pay | Admitting: Family Medicine

## 2024-05-22 ENCOUNTER — Encounter: Payer: Self-pay | Admitting: Family Medicine

## 2024-05-22 ENCOUNTER — Ambulatory Visit (INDEPENDENT_AMBULATORY_CARE_PROVIDER_SITE_OTHER): Admitting: Family Medicine

## 2024-05-22 VITALS — BP 126/74 | HR 100 | Temp 98.0°F | Resp 16 | Ht 62.0 in | Wt 178.6 lb

## 2024-05-22 DIAGNOSIS — L739 Follicular disorder, unspecified: Secondary | ICD-10-CM | POA: Diagnosis not present

## 2024-05-22 DIAGNOSIS — L309 Dermatitis, unspecified: Secondary | ICD-10-CM

## 2024-05-22 DIAGNOSIS — S31829A Unspecified open wound of left buttock, initial encounter: Secondary | ICD-10-CM

## 2024-05-22 DIAGNOSIS — R21 Rash and other nonspecific skin eruption: Secondary | ICD-10-CM

## 2024-05-22 MED ORDER — TRIAMCINOLONE ACETONIDE 0.1 % EX CREA
1.0000 | TOPICAL_CREAM | Freq: Two times a day (BID) | CUTANEOUS | 1 refills | Status: DC
Start: 2024-05-22 — End: 2024-08-28

## 2024-05-22 NOTE — Patient Instructions (Addendum)
 Try not to scratch as this can make things worse. Avoid scented products while dealing with this. You may resume when the itchiness resolves. Cold/cool compresses can help.   Send me a message in 2-3 days if not improving.   Apply triple antibiotic ointment over your wound on the bottom twice daily.   Soak the bottom twice daily in Epsom salt or betadine.   Let us  know if you need anything.

## 2024-05-22 NOTE — Progress Notes (Signed)
 Chief Complaint  Patient presents with   Rash    Rash    Ebony Garcia is a 73 y.o. female here for a skin complaint.  Duration: 8 months Location: legs Pruritic? Yes Painful? No Drainage? No New soaps/lotions/topicals/detergents? No Recent illness? Nothing preceding the rash Other associated symptoms: spreading to buttock region and upper chest Therapies tried thus far: Desitin   She has had around a week of a painful rash over her buttock and a sore between her butt cheeks.  No obvious trauma or injury.  No fevers currently but she did have fevers and chills between 4 and 2 days ago.  Has not tried anything over this area.  Past Medical History:  Diagnosis Date   Acute diastolic CHF (congestive heart failure) (HCC) 04/30/2013   5/24-27/14 acute diastolic heart failure  BNP 1528 2 D echocardiogram essentially negative  Chest x-ray revealed cardiomegaly with costophrenic angle blunting and suggestion of possible interstitial vascular accentuation; no frank heart failure present. 10 pound weight loss with 20 mg of furosemide  daily  No definite etiology of the cardiomegaly or acute diastolic failure    Anxiety 02/07/2015   Auditory hallucination 10/06/2017   B12 deficiency 10/07/2018   Bradycardia 04/30/2013   Cancer (HCC)    Chest pain 05/14/2020   Chest pain with moderate risk of acute coronary syndrome 02/07/2015   CHF (congestive heart failure) (HCC)    dystolic heart dysfunction   Chronic kidney disease    De Quervain's tenosynovitis, left 05/23/2013   Depression    Depression with anxiety    Depressive disorder, not elsewhere classified 05/31/2007   Centricity Description: DISORDER, DEPRESSIVE NEC Qualifier: Diagnosis of  By: Donnice Gale MD, Dorothye Gathers, [facility]    Diabetes mellitus without complication [facility])    Diet controlled   Dysmetabolic syndrome X 02/27/2008   Qualifier: Diagnosis of  By: Donnice Gale MD, William  A1c 6.3% in 2/12;  TG 278    Essential hypertension 08/09/2008   Qualifier: Diagnosis of  By: Donnice Gale MD, William     Estrogen deficiency 02/02/2019   Estrogen deficiency 02/02/2019   Fatigue 02/02/2019   Hair loss 02/02/2019   Hyperlipidemia 02/27/2008   Qualifier: Diagnosis of  By: Donnice Gale MD, William     Hypertension    Intertrigo    INTERTRIGO 11/08/2009   Qualifier: Diagnosis of  By: Fleming Hum FNP, Melissa S    Intractable migraine with aura without status migrainosus 08/03/2018   Migraine variant 08/15/2008   Qualifier: Diagnosis of  By: Donnice Gale MD, William   Onset:initially @ age 70;recurrence age 5 during 1st trimester. Recurrence @ 39. No prodrome or aura Previously on Tryptans & Topamax  with temporary relief but tolerance appeared Botox by Dr Laray Platt 09/2010  most effective     Migraines    Myalgia 02/02/2019   NONSPECIFIC ABNORMAL ELECTROCARDIOGRAM 08/15/2008   Qualifier: Diagnosis of  By: Donnice Gale MD, William     Palpitation 04/17/2014   Panic attacks    Panic attacks    Polydipsia 11/02/2007   Qualifier: Diagnosis of  By: Donnice Gale MD, William     RENAL CALCULUS 02/21/2007   Qualifier: Diagnosis of  By: Nilsa Bash     Squamous cell carcinoma in situ of skin 12/26/2015   Thrombocytopenia (HCC) 10/06/2017   Viral syndrome 04/10/2019   WEIGHT GAIN 11/02/2007   Qualifier: Diagnosis of  By: Donnice Gale MD, William      BP 126/74 (BP Location: Left Arm, Patient Position: Sitting)  Pulse 100   Temp 98 F (36.7 C) (Oral)   Resp 16   Ht 5' 2 (1.575 m)   Wt 178 lb 9.6 oz (81 kg)   SpO2 97%   BMI 32.67 kg/m  Gen: awake, alert, appearing stated age Lungs: No accessory muscle use Skin: pinpoint petechial rash over the anterior legs b/l. No drainage, erythema, TTP, fluctuance, excoriation Over the anterior neck, there are 3 patches of pinkish skin with overlying scale. I examined her buttocks in the presence of a female chaperone.  On the skin overlying the ischial tuberosities  bilaterally, there is a patch of erythema at the base of the hair follicles without significant TTP, fluctuance, drainage.  There is no excessive warmth.  Around 2 cm from the anal opening on the left, there is a 0.6 cm ulceration without significant erythema, excessive warmth, drainage, or fluctuance.  There is minimal induration.  No significant TTP. Psych: Age appropriate judgment and insight  Rash  Dermatitis  Wound of left buttock, initial encounter  Folliculitis  Consider vasculitis as it does not blanch.  Will trial a topical steroid and see if this helps with the itching at the very least.  Could consider an oral course of prednisone  which she would like to avoid if possible.  Appreciate dermatology team who she will see next month.  Avoid scented products.  Try not to scratch. Looks like dermatitis on the anterior neck and upper chest region.  Could use the Kenalog cream on this area as well.  Avoid scented products, scratching, and consider cool compresses. No signs of infection or abscess today.  Twice daily sitz bath's with Epsom salt or Betadine recommended.  Twice daily application of triple antibiotic ointment recommended.  If no improvement, would consider wound care team.  If worsening, would consider reevaluating and possible infection. Reassurance.  This appears to be a resolving folliculitis.  This could have caused the systemic symptoms she was experiencing late last week. She will follow-up with her regular PCP as originally scheduled. The patient voiced understanding and agreement to the plan.  I spent 33 minutes with the patient discussing the above plans in addition to reviewing her chart on the same day of the visit.  Shellie Dials [place], DO 05/22/24 4:00 PM

## 2024-05-24 DIAGNOSIS — H43811 Vitreous degeneration, right eye: Secondary | ICD-10-CM | POA: Diagnosis not present

## 2024-06-05 ENCOUNTER — Other Ambulatory Visit: Payer: Self-pay | Admitting: Family Medicine

## 2024-06-05 DIAGNOSIS — F419 Anxiety disorder, unspecified: Secondary | ICD-10-CM

## 2024-06-05 NOTE — Telephone Encounter (Signed)
 Requesting: alprazolam  1mg   Contract: 05/05/22 UDS: 05/05/22 Last Visit: 04/20/24 Next Visit: None Last Refill: 05/02/24 #30 and 0RF  Please Advise

## 2024-06-07 ENCOUNTER — Inpatient Hospital Stay: Payer: Medicare HMO | Attending: Family

## 2024-06-07 VITALS — BP 134/83 | HR 83 | Temp 98.1°F | Resp 20

## 2024-06-07 DIAGNOSIS — E538 Deficiency of other specified B group vitamins: Secondary | ICD-10-CM | POA: Insufficient documentation

## 2024-06-07 DIAGNOSIS — D6959 Other secondary thrombocytopenia: Secondary | ICD-10-CM | POA: Diagnosis not present

## 2024-06-07 MED ORDER — CYANOCOBALAMIN 1000 MCG/ML IJ SOLN
1000.0000 ug | Freq: Once | INTRAMUSCULAR | Status: AC
  Administered 2024-06-07: 1000 ug via INTRAMUSCULAR
  Filled 2024-06-07: qty 1

## 2024-06-07 NOTE — Patient Instructions (Signed)
 Vitamin B12 Injection What is this medication? Vitamin B12 (VAHY tuh min B12) prevents and treats low vitamin B12 levels in your body. It is used in people who do not get enough vitamin B12 from their diet or when their digestive tract does not absorb enough. Vitamin B12 plays an important role in maintaining the health of your nervous system and red blood cells. This medicine may be used for other purposes; ask your health care provider or pharmacist if you have questions. COMMON BRAND NAME(S): B-12 Compliance Kit, B-12 Injection Kit, Cyomin, Dodex, LA-12, Nutri-Twelve, Physicians EZ Use B-12, Primabalt, Vitamin Deficiency Injectable System - B12 What should I tell my care team before I take this medication? They need to know if you have any of these conditions: Kidney disease Leber's disease Megaloblastic anemia An unusual or allergic reaction to cyanocobalamin, cobalt, other medications, foods, dyes, or preservatives Pregnant or trying to get pregnant Breast-feeding How should I use this medication? This medication is injected into a muscle or deeply under the skin. It is usually given in a clinic or care team's office. However, your care team may teach you how to inject yourself. Follow all instructions. Talk to your care team about the use of this medication in children. Special care may be needed. Overdosage: If you think you have taken too much of this medicine contact a poison control center or emergency room at once. NOTE: This medicine is only for you. Do not share this medicine with others. What if I miss a dose? If you are given your dose at a clinic or care team's office, call to reschedule your appointment. If you give your own injections, and you miss a dose, take it as soon as you can. If it is almost time for your next dose, take only that dose. Do not take double or extra doses. What may interact with this medication? Alcohol Colchicine This list may not describe all possible  interactions. Give your health care provider a list of all the medicines, herbs, non-prescription drugs, or dietary supplements you use. Also tell them if you smoke, drink alcohol, or use illegal drugs. Some items may interact with your medicine. What should I watch for while using this medication? Visit your care team regularly. You may need blood work done while you are taking this medication. You may need to follow a special diet. Talk to your care team. Limit your alcohol intake and avoid smoking to get the best benefit. What side effects may I notice from receiving this medication? Side effects that you should report to your care team as soon as possible: Allergic reactions--skin rash, itching, hives, swelling of the face, lips, tongue, or throat Swelling of the ankles, hands, or feet Trouble breathing Side effects that usually do not require medical attention (report to your care team if they continue or are bothersome): Diarrhea This list may not describe all possible side effects. Call your doctor for medical advice about side effects. You may report side effects to FDA at 1-800-FDA-1088. Where should I keep my medication? Keep out of the reach of children. Store at room temperature between 15 and 30 degrees C (59 and 85 degrees F). Protect from light. Throw away any unused medication after the expiration date. NOTE: This sheet is a summary. It may not cover all possible information. If you have questions about this medicine, talk to your doctor, pharmacist, or health care provider.  2024 Elsevier/Gold Standard (2021-08-05 00:00:00)

## 2024-06-15 ENCOUNTER — Encounter: Payer: Self-pay | Admitting: Family Medicine

## 2024-06-15 ENCOUNTER — Other Ambulatory Visit: Payer: Self-pay | Admitting: Family Medicine

## 2024-06-15 MED ORDER — DOXYCYCLINE HYCLATE 100 MG PO TABS: 100.0000 mg | ORAL_TABLET | Freq: Two times a day (BID) | ORAL | 0 refills | Status: DC

## 2024-06-19 ENCOUNTER — Ambulatory Visit

## 2024-06-19 VITALS — BP 121/69 | HR 56 | Temp 98.0°F | Resp 18 | Ht 62.0 in | Wt 178.0 lb

## 2024-06-19 DIAGNOSIS — G43119 Migraine with aura, intractable, without status migrainosus: Secondary | ICD-10-CM

## 2024-06-19 MED ORDER — SODIUM CHLORIDE 0.9 % IV SOLN
100.0000 mg | Freq: Once | INTRAVENOUS | Status: AC
  Administered 2024-06-19: 100 mg via INTRAVENOUS
  Filled 2024-06-19: qty 1

## 2024-06-19 NOTE — Progress Notes (Signed)
 Diagnosis: Migraine  Provider:  Lonna Coder MD  Procedure: IV Infusion  IV Type: Peripheral, IV Location: R Antecubital  Vyepti  (Eptinezumab -jjmr), Dose: 100 mg  Infusion Start Time: 1536  Infusion Stop Time: 1606  Post Infusion IV Care: Peripheral IV Discontinued  Discharge: Condition: Good, Destination: Home . AVS Declined  Performed by:  Maximiano JONELLE Pouch, LPN

## 2024-06-20 DIAGNOSIS — L817 Pigmented purpuric dermatosis: Secondary | ICD-10-CM | POA: Diagnosis not present

## 2024-06-21 ENCOUNTER — Encounter: Payer: Self-pay | Admitting: Family Medicine

## 2024-06-21 ENCOUNTER — Telehealth: Payer: Self-pay

## 2024-06-21 DIAGNOSIS — I10 Essential (primary) hypertension: Secondary | ICD-10-CM

## 2024-06-21 NOTE — Telephone Encounter (Signed)
 Copied from CRM [id number]. Topic: Referral - Question >> Jun 20, 2024  4:22 PM Chiquita SQUIBB wrote: Reason for CRM: Lorn from [facility] dermatology regarding the referral, she states the last note states a punch biopsy was done but they did not receive the results, and the doctor will need them. A good fax number is [phone]

## 2024-06-21 NOTE — Telephone Encounter (Signed)
 Results faxed.

## 2024-06-27 ENCOUNTER — Encounter: Payer: Self-pay | Admitting: Pulmonary Disease

## 2024-06-27 ENCOUNTER — Ambulatory Visit: Admitting: Pulmonary Disease

## 2024-06-27 VITALS — BP 111/69 | HR 86 | Temp 97.6°F | Ht 62.0 in | Wt 176.4 lb

## 2024-06-27 DIAGNOSIS — R053 Chronic cough: Secondary | ICD-10-CM | POA: Diagnosis not present

## 2024-06-27 NOTE — Progress Notes (Signed)
 @Patient  ID: Ebony Garcia, female    DOB: 12-19-1950, 73 y.o.   MRN: [id number]  No chief complaint on file.   Referring provider: Antonio Cyndee Jamee Garcia, *  HPI:   73 y.o. woman whom we are seeing in consultation for evaluation of chronic cough.  PCP note reviewed.  Cough seems a bit better.  Serendipitously.  No real changes.  Her skin stuff has gotten better.  Diagnosed with essentially leaky blood vessel dermatitis.  Fortunately no vasculitis.  HPI at initial visit: Patient reports several history of cough.  Daily.  Worse over the last 3 to 4 months or so.  Often dry.  Occasional bouts of scant sputum.  No times a day with things are better or worse.  No position that makes things better or worse.  No environmental seasonal factors she can identify that make things better or worse.  No real alleviating or exacerbating factors.  Recently prescribed Tessalon  Perles without relief.  Recently treated for sinus infection x2 with 2 different antibiotics per PCP.  This did help with sinus pressure, mild improvement in congestion but no change in the cough.  She endorses some atopic symptoms, seasonal allergies.  No rashes.  She describes intense bandlike pressure and pain sometimes in the evenings that I perceive as being GERD.  She thinks this is anxiety as she has had a lot of grief recently.  Review chest x-ray 10/20/2021 on my review interpretation reveals clear lungs bilaterally.  PMH: Seasonal allergies, anxiety, hypertension Surgical history: Hysterectomy, breast surgery, nasal septum surgery Family history: Mother was diagnosed with likely lung cancer, heart failure, A. fib, father with bladder cancer Social history: Never smoker, lives in [place], estranged from children   Questionaires / Pulmonary Flowsheets:   ACT:      No data to display          MMRC:     No data to display          Epworth:      No data to display          Tests:   FENO:  No  results found for: NITRICOXIDE  PFT:     No data to display          WALK:      No data to display          Imaging: Personally reviewed as per EMR discussion this note No results found.  Lab Results: Personally reviewed CBC    Component Value Date/Time   WBC 6.3 02/02/2024 1316   WBC 6.5 06/21/2023 1447   RBC 4.60 02/02/2024 1316   HGB 13.8 02/02/2024 1316   HGB 14.4 11/11/2017 1339   HCT 43.3 02/02/2024 1316   HCT 44.0 11/11/2017 1339   PLT 73 (L) 02/02/2024 1316   PLT 105 Platelet count consistent in citrate (L) 11/11/2017 1339   MCV 94.1 02/02/2024 1316   MCV 94 11/11/2017 1339   MCH 30.0 02/02/2024 1316   MCHC 31.9 02/02/2024 1316   RDW 13.2 02/02/2024 1316   RDW 13.6 11/11/2017 1339   LYMPHSABS 1.5 02/02/2024 1316   LYMPHSABS 1.6 11/11/2017 1339   MONOABS 0.3 02/02/2024 1316   EOSABS 0.2 02/02/2024 1316   EOSABS 0.2 11/11/2017 1339   BASOSABS 0.1 02/02/2024 1316   BASOSABS 0.0 11/11/2017 1339    BMET    Component Value Date/Time   NA 140 02/02/2024 1316   NA 143 11/11/2017 1339   K 4.0 02/02/2024 1316  K 3.6 11/11/2017 1339   CL 105 02/02/2024 1316   CL 109 (H) 11/11/2017 1339   CO2 27 02/02/2024 1316   CO2 24 11/11/2017 1339   GLUCOSE 166 (H) 02/02/2024 1316   GLUCOSE 132 (H) 11/11/2017 1339   BUN 15 02/02/2024 1316   BUN 9 11/11/2017 1339   CREATININE 0.99 02/02/2024 1316   CREATININE 1.00 (H) 10/18/2020 1433   CALCIUM 9.6 02/02/2024 1316   CALCIUM 9.3 11/11/2017 1339   GFRNONAA >60 02/02/2024 1316   GFRAA >60 07/01/2020 1337    BNP    Component Value Date/Time   BNP 74.7 11/03/2016 1810    ProBNP    Component Value Date/Time   PROBNP 40.0 05/10/2013 1627    Specialty Problems       Pulmonary Problems   Acute non-recurrent pansinusitis   Chronic cough    Allergies  Allergen Reactions   Amoxicillin-Pot Clavulanate Diarrhea and Other (See Comments)   Antihistamines, Diphenhydramine -Type Other (See Comments)     Other Reaction: makes pt nervous   Diphenhydramine  Hcl Other (See Comments)    Other Reaction: makes pt nervous   Fremanezumab -Vfrm Rash and Other (See Comments)    Seizure, jerking   Clindamycin Other (See Comments)    She has not taken but per patient  her mother had a reaction and she does not want it ever   Hydrocodone -Acetaminophen  Other (See Comments)    REACTION: sensitive to  but can take for extreme pain per patient   Lisinopril      Restless , irregular sleep   Nortriptyline  Other (See Comments)    UNKNOWN REACTION   Other Other (See Comments)   Propoxyphene N-Acetaminophen  Nausea Only   Sulfa Antibiotics Other (See Comments)    Unknown reaction   Tramadol  Other (See Comments)    UNKNOWN REACTION   Tramadol  Hcl Other (See Comments)    Immunization History  Administered Date(s) Administered   Fluad Quad(high Dose 65+) 08/03/2019, 11/04/2021, 11/05/2022   Hepatitis B, ADULT 07/30/2016, 09/29/2016   Influenza Split 09/19/2012   Influenza Whole 11/08/2009   Influenza,inj,Quad PF,6+ Mos 11/07/2013, 07/30/2016, 09/20/2017, 09/14/2018   Influenza-Unspecified 09/11/2015, 10/08/2023   Moderna Sars-Covid-2 Vaccination 02/16/2020, 03/18/2020, 10/04/2020   PPD Test 07/30/2016, 11/03/2017   Pneumococcal Conjugate-13 08/03/2019   Td 11/08/2009   Zoster Recombinant(Shingrix) 01/17/2022    Past Medical History:  Diagnosis Date   Acute diastolic CHF (congestive heart failure) (HCC) 04/30/2013   5/24-27/14 acute diastolic heart failure  BNP 1528 2 D echocardiogram essentially negative  Chest x-ray revealed cardiomegaly with costophrenic angle blunting and suggestion of possible interstitial vascular accentuation; no frank heart failure present. 10 pound weight loss with 20 mg of furosemide  daily  No definite etiology of the cardiomegaly or acute diastolic failure    Anxiety 02/07/2015   Auditory hallucination 10/06/2017   B12 deficiency 10/07/2018   Bradycardia 04/30/2013    Cancer (HCC)    Chest pain 05/14/2020   Chest pain with moderate risk of acute coronary syndrome 02/07/2015   CHF (congestive heart failure) (HCC)    dystolic heart dysfunction   Chronic kidney disease    De Quervain's tenosynovitis, left 05/23/2013   Depression    Depression with anxiety    Depressive disorder, not elsewhere classified 05/31/2007   Centricity Description: DISORDER, DEPRESSIVE NEC Qualifier: Diagnosis of  By: Tish MD, Elsie Olam [place], [facility]    Diabetes mellitus without complication [facility])    Diet controlled   Dysmetabolic syndrome X  02/27/2008   Qualifier: Diagnosis of  By: Tish MD, William  A1c 6.3% in 2/12; TG 278    Essential hypertension 08/09/2008   Qualifier: Diagnosis of  By: Tish MD, William     Estrogen deficiency 02/02/2019   Estrogen deficiency 02/02/2019   Fatigue 02/02/2019   Hair loss 02/02/2019   Hyperlipidemia 02/27/2008   Qualifier: Diagnosis of  By: Tish MD, William     Hypertension    Intertrigo    INTERTRIGO 11/08/2009   Qualifier: Diagnosis of  By: Daryl FNP, Melissa S    Intractable migraine with aura without status migrainosus 08/03/2018   Migraine variant 08/15/2008   Qualifier: Diagnosis of  By: Tish MD, Elsie   Onset:initially @ age 12;recurrence age 71 during 1st trimester. Recurrence @ 39. No prodrome or aura Previously on Tryptans & Topamax  with temporary relief but tolerance appeared Botox by Dr Gerome COTA 09/2010  most effective     Migraines    Myalgia 02/02/2019   NONSPECIFIC ABNORMAL ELECTROCARDIOGRAM 08/15/2008   Qualifier: Diagnosis of  By: Tish MD, William     Palpitation 04/17/2014   Panic attacks    Panic attacks    Polydipsia 11/02/2007   Qualifier: Diagnosis of  By: Tish MD, William     RENAL CALCULUS 02/21/2007   Qualifier: Diagnosis of  By: Nicholaus Channel     Squamous cell carcinoma in situ of skin 12/26/2015   Thrombocytopenia (HCC) 10/06/2017   Viral  syndrome 04/10/2019   WEIGHT GAIN 11/02/2007   Qualifier: Diagnosis of  By: Tish MD, Elsie      Tobacco History: Social History   Tobacco Use  Smoking Status Never   Passive exposure: Past  Smokeless Tobacco Never   Counseling given: Not Answered   Continue to not smoke  Outpatient Encounter Medications as of 06/27/2024  Medication Sig   ALPRAZolam  (XANAX ) 1 MG tablet TAKE 1 TABLET BY MOUTH EVERY DAY AT BEDTIME AS NEEDED FOR SLEEP   amLODipine  (NORVASC ) 5 MG tablet TAKE 1 TABLET EVERY DAY   aspirin  EC 81 MG tablet Take 1 tablet (81 mg total) by mouth daily. Swallow whole.   doxycycline  (VIBRA -TABS) 100 MG tablet Take 1 tablet (100 mg total) by mouth 2 (two) times daily.   Eptinezumab -jjmr (VYEPTI ) 100 MG/ML injection Infusion q12 weeks   FLUoxetine  (PROZAC ) 20 MG capsule TAKE 1 CAPSULE EVERY DAY   furosemide  (LASIX ) 20 MG tablet TAKE 1 TABLET EVERY DAY   metoCLOPramide  (REGLAN ) 10 MG tablet Take 1 tablet (10 mg total) by mouth every 6 (six) hours as needed for nausea.   triamcinolone  cream (KENALOG ) 0.1 % Apply 1 Application topically 2 (two) times daily.   No facility-administered encounter medications on file as of 06/27/2024.     Review of Systems  Review of Systems  N/a Physical Exam  BP 111/69   Pulse 86   Temp 97.6 F (36.4 C) (Temporal)   Ht 5' 2 (1.575 m)   Wt 176 lb 6.4 oz (80 kg)   SpO2 96%   BMI 32.26 kg/m   Wt Readings from Last 5 Encounters:  06/27/24 176 lb 6.4 oz (80 kg)  06/19/24 178 lb (80.7 kg)  05/22/24 178 lb 9.6 oz (81 kg)  04/20/24 184 lb 9.6 oz (83.7 kg)  04/14/24 180 lb 6.4 oz (81.8 kg)    BMI Readings from Last 5 Encounters:  06/27/24 32.26 kg/m  06/19/24 32.56 kg/m  05/22/24 32.67 kg/m  04/20/24 33.76 kg/m  04/14/24 33.00 kg/m  Physical Exam General: Well-appearing, no acute distress Eyes: EOMI, icterus Neck: Supple, no JVP Pulmonary: Clear, no work of breathing Cardiovascular: Regular in rhythm, no  murmur Abdomen: Nondistended, bowel sounds present MSK: No synovitis, no joint effusion Neuro: Normal gait, no weakness Psych: Normal mood, full affect  Assessment & Plan:   Chronic cough: Present for many years.  Likely multifactorial.  No clear driver.  She has longstanding what she describes as sinus disease, sinus congestion.  No better after trying nasal regimen.  Consider ENT referral.  She denies GERD symptoms but this is possible, silent reflux.  Lastly discussed trying inhalers to treat cough variant asthma.  Fortunately lung images have been clear.  And her cough is slightly better.  At this time no inhalers recommended.  Petechial rash: Nonblanching, petechial and purpuric.  Likely it was biopsied not vasculitis.  She is wearing compression stockings as prescribed.  Return if symptoms worsen or fail to improve.  Follow-up as needed   Donnice Garcia [place], MD 06/27/2024

## 2024-06-28 MED ORDER — LOSARTAN POTASSIUM 50 MG PO TABS: 50.0000 mg | ORAL_TABLET | Freq: Every day | ORAL | 2 refills | Status: DC

## 2024-06-29 ENCOUNTER — Encounter: Payer: Self-pay | Admitting: Family Medicine

## 2024-07-12 ENCOUNTER — Other Ambulatory Visit: Payer: Self-pay | Admitting: Family Medicine

## 2024-07-12 DIAGNOSIS — F419 Anxiety disorder, unspecified: Secondary | ICD-10-CM

## 2024-07-13 NOTE — Telephone Encounter (Signed)
 Requesting: Xanax  Contract: 2023 UDS: 2023 Last OV: 05/22/24 Next OV: n/a Last Refill: 06/06/2024, #30--0RF Database:   Please advise

## 2024-08-01 ENCOUNTER — Inpatient Hospital Stay: Payer: Medicare HMO

## 2024-08-01 ENCOUNTER — Inpatient Hospital Stay: Payer: Medicare HMO | Attending: Family

## 2024-08-01 ENCOUNTER — Inpatient Hospital Stay (HOSPITAL_BASED_OUTPATIENT_CLINIC_OR_DEPARTMENT_OTHER): Payer: Medicare HMO | Admitting: Family

## 2024-08-01 VITALS — BP 131/72 | HR 67 | Temp 98.4°F | Resp 17 | Ht 62.0 in | Wt 176.4 lb

## 2024-08-01 DIAGNOSIS — D696 Thrombocytopenia, unspecified: Secondary | ICD-10-CM | POA: Diagnosis not present

## 2024-08-01 DIAGNOSIS — D6959 Other secondary thrombocytopenia: Secondary | ICD-10-CM | POA: Insufficient documentation

## 2024-08-01 DIAGNOSIS — E538 Deficiency of other specified B group vitamins: Secondary | ICD-10-CM | POA: Insufficient documentation

## 2024-08-01 LAB — CMP (CANCER CENTER ONLY)
ALT: 16 U/L (ref 0–44)
AST: 20 U/L (ref 15–41)
Albumin: 4.6 g/dL (ref 3.5–5.0)
Alkaline Phosphatase: 103 U/L (ref 38–126)
Anion gap: 15 (ref 5–15)
BUN: 11 mg/dL (ref 8–23)
CO2: 22 mmol/L (ref 22–32)
Calcium: 9.3 mg/dL (ref 8.9–10.3)
Chloride: 101 mmol/L (ref 98–111)
Creatinine: 1 mg/dL (ref 0.44–1.00)
GFR, Estimated: >60 mL/min (ref >60)
Glucose, Bld: 185 mg/dL — ABNORMAL HIGH (ref 70–99)
Potassium: 4.2 mmol/L (ref 3.5–5.1)
Sodium: 138 mmol/L (ref 135–145)
Total Bilirubin: 0.4 mg/dL (ref 0.0–1.2)
Total Protein: 7.2 g/dL (ref 6.5–8.1)

## 2024-08-01 LAB — CBC WITH DIFFERENTIAL (CANCER CENTER ONLY)
Abs Immature Granulocytes: 0.02 K/uL (ref 0.00–0.07)
Basophils Absolute: 0.1 K/uL (ref 0.0–0.1)
Basophils Relative: 1 %
Eosinophils Absolute: 0.2 K/uL (ref 0.0–0.5)
Eosinophils Relative: 3 %
HCT: 44 % (ref 36.0–46.0)
Hemoglobin: 14.2 g/dL (ref 12.0–15.0)
Immature Granulocytes: 0 %
Lymphocytes Relative: 19 %
Lymphs Abs: 1.4 K/uL (ref 0.7–4.0)
MCH: 29.7 pg (ref 26.0–34.0)
MCHC: 32.3 g/dL (ref 30.0–36.0)
MCV: 92.1 fL (ref 80.0–100.0)
Monocytes Absolute: 0.4 K/uL (ref 0.1–1.0)
Monocytes Relative: 5 %
Neutro Abs: 5.1 K/uL (ref 1.7–7.7)
Neutrophils Relative %: 72 %
Platelet Count: 59 K/uL — ABNORMAL LOW (ref 150–400)
RBC: 4.78 MIL/uL (ref 3.87–5.11)
RDW: 12.8 % (ref 11.5–15.5)
WBC Count: 7.1 K/uL (ref 4.0–10.5)
nRBC: 0 % (ref 0.0–0.2)

## 2024-08-01 LAB — VITAMIN B12: Vitamin B-12: 365 pg/mL (ref 180–914)

## 2024-08-01 MED ORDER — CYANOCOBALAMIN 1000 MCG/ML IJ SOLN
1000.0000 ug | Freq: Once | INTRAMUSCULAR | Status: AC
  Administered 2024-08-01: 1000 ug via INTRAMUSCULAR
  Filled 2024-08-01: qty 1

## 2024-08-01 NOTE — Patient Instructions (Signed)
 Vitamin B12 Injection What is this medication? Vitamin B12 (VAHY tuh min B12) prevents and treats low vitamin B12 levels in your body. It is used in people who do not get enough vitamin B12 from their diet or when their digestive tract does not absorb enough. Vitamin B12 plays an important role in maintaining the health of your nervous system and red blood cells. This medicine may be used for other purposes; ask your health care provider or pharmacist if you have questions. COMMON BRAND NAME(S): B-12 Compliance Kit, B-12 Injection Kit, Cyomin, Dodex , LA-12, Nutri-Twelve, Physicians EZ Use B-12, Primabalt, Vitamin Deficiency Injectable System - B12 What should I tell my care team before I take this medication? They need to know if you have any of these conditions: Kidney disease Leber's disease Megaloblastic anemia An unusual or allergic reaction to cyanocobalamin , cobalt, other medications, foods, dyes, or preservatives Pregnant or trying to get pregnant Breast-feeding How should I use this medication? This medication is injected into a muscle or deeply under the skin. It is usually given in a clinic or care team's office. However, your care team may teach you how to inject yourself. Follow all instructions. Talk to your care team about the use of this medication in children. Special care may be needed. Overdosage: If you think you have taken too much of this medicine contact a poison control center or emergency room at once. NOTE: This medicine is only for you. Do not share this medicine with others. What if I miss a dose? If you are given your dose at a clinic or care team's office, call to reschedule your appointment. If you give your own injections, and you miss a dose, take it as soon as you can. If it is almost time for your next dose, take only that dose. Do not take double or extra doses. What may interact with this medication? Alcohol Colchicine This list may not describe all possible  interactions. Give your health care provider a list of all the medicines, herbs, non-prescription drugs, or dietary supplements you use. Also tell them if you smoke, drink alcohol, or use illegal drugs. Some items may interact with your medicine. What should I watch for while using this medication? Visit your care team regularly. You may need blood work done while you are taking this medication. You may need to follow a special diet. Talk to your care team. Limit your alcohol intake and avoid smoking to get the best benefit. What side effects may I notice from receiving this medication? Side effects that you should report to your care team as soon as possible: Allergic reactions--skin rash, itching, hives, swelling of the face, lips, tongue, or throat Swelling of the ankles, hands, or feet Trouble breathing Side effects that usually do not require medical attention (report to your care team if they continue or are bothersome): Diarrhea This list may not describe all possible side effects. Call your doctor for medical advice about side effects. You may report side effects to FDA at 1-800-FDA-1088. Where should I keep my medication? Keep out of the reach of children. Store at room temperature between 15 and 30 degrees C (59 and 85 degrees F). Protect from light. Throw away any unused medication after the expiration date. NOTE: This sheet is a summary. It may not cover all possible information. If you have questions about this medicine, talk to your doctor, pharmacist, or health care provider.  2024 Elsevier/Gold Standard (2021-08-05 00:00:00)

## 2024-08-01 NOTE — Progress Notes (Signed)
 Hematology and Oncology Follow Up Visit  Ebony Garcia [id number] 03/20/1951 73 y.o. 08/01/2024   Principle Diagnosis:  Medication induced thrombocytopenia B 12 deficiency    Current Therapy:        Observation B 12 injection Q 6 weeks   Interim History:  [name] is here today for follow-up and B 12 injection. She states that she was recently diagnosed with Schamberg's disease with capillary inflammation and leakage of red blood cells in the legs. She has also noted the rash around her neck but this has all calmed down with the use of a corticosteroid cream.  She is also wearing compression stockings on both legs for added support.  She states that this has caused some hyperpigmentation in the lower extremities.  No other blood loss noted. No abnormal excessive bruising.  She has occasional episodes of chest discomfort that radiate from the left side to the right. These come and go.  No fever, chills, n/v, cough, rash, dizziness, SOB, abdominal pain or changes in bowel or bladder habits.  Neuropathy in her upper and lower extremities  No falls or syncope.  Appetite and hydration are good. Weight is stable at 176 lbs.   ECOG Performance Status: 1 - Symptomatic but completely ambulatory  Medications:  Allergies as of 08/01/2024       Reactions   Amoxicillin-pot Clavulanate Diarrhea, Other (See Comments)   Antihistamines, Diphenhydramine -type Other (See Comments)   Other Reaction: makes pt nervous   Diphenhydramine  Hcl Other (See Comments)   Other Reaction: makes pt nervous   Fremanezumab -vfrm Rash, Other (See Comments)   Seizure, jerking   Clindamycin Other (See Comments)   She has not taken but per patient  her mother had a reaction and she does not want it ever   Hydrocodone -acetaminophen  Other (See Comments)   REACTION: sensitive to  but can take for extreme pain per patient   Lisinopril     Restless , irregular sleep   Nortriptyline  Other (See Comments)    UNKNOWN REACTION   Other Other (See Comments)   Propoxyphene N-acetaminophen  Nausea Only   Sulfa Antibiotics Other (See Comments)   Unknown reaction   Tramadol  Other (See Comments)   UNKNOWN REACTION   Tramadol  Hcl Other (See Comments)        Medication List        Accurate as of August 01, 2024  1:51 PM. If you have any questions, ask your nurse or doctor.          ALPRAZolam  1 MG tablet Commonly known as: XANAX  TAKE 1 TABLET BY MOUTH EVERY DAY AT BEDTIME AS NEEDED FOR SLEEP   aspirin  EC 81 MG tablet Take 1 tablet (81 mg total) by mouth daily. Swallow whole.   doxycycline  100 MG tablet Commonly known as: VIBRA -TABS Take 1 tablet (100 mg total) by mouth 2 (two) times daily.   FLUoxetine  20 MG capsule Commonly known as: PROZAC  TAKE 1 CAPSULE EVERY DAY   furosemide  20 MG tablet Commonly known as: LASIX  TAKE 1 TABLET EVERY DAY   losartan  50 MG tablet Commonly known as: COZAAR  Take 1 tablet (50 mg total) by mouth daily.   metoCLOPramide  10 MG tablet Commonly known as: Reglan  Take 1 tablet (10 mg total) by mouth every 6 (six) hours as needed for nausea.   triamcinolone  cream 0.1 % Commonly known as: KENALOG  Apply 1 Application topically 2 (two) times daily.   triamcinolone  cream 0.1 % Commonly known as: KENALOG  Apply 1 Application topically 2 (two)  times daily.   Vyepti  100 MG/ML injection Generic drug: Eptinezumab -jjmr Infusion q12 weeks        Allergies:  Allergies  Allergen Reactions   Amoxicillin-Pot Clavulanate Diarrhea and Other (See Comments)   Antihistamines, Diphenhydramine -Type Other (See Comments)    Other Reaction: makes pt nervous   Diphenhydramine  Hcl Other (See Comments)    Other Reaction: makes pt nervous   Fremanezumab -Vfrm Rash and Other (See Comments)    Seizure, jerking   Clindamycin Other (See Comments)    She has not taken but per patient  her mother had a reaction and she does not want it ever   Hydrocodone -Acetaminophen   Other (See Comments)    REACTION: sensitive to  but can take for extreme pain per patient   Lisinopril      Restless , irregular sleep   Nortriptyline  Other (See Comments)    UNKNOWN REACTION   Other Other (See Comments)   Propoxyphene N-Acetaminophen  Nausea Only   Sulfa Antibiotics Other (See Comments)    Unknown reaction   Tramadol  Other (See Comments)    UNKNOWN REACTION   Tramadol  Hcl Other (See Comments)    Past Medical History, Surgical history, Social history, and Family History were reviewed and updated.  Review of Systems: All other 10 point review of systems is negative.   Physical Exam:  height is 5' 2 (1.575 m) and weight is 176 lb 6.4 oz (80 kg). Her oral temperature is 98.4 F (36.9 C). Her blood pressure is 131/72 and her pulse is 67. Her oxygen saturation is 98%.   Wt Readings from Last 3 Encounters:  08/01/24 176 lb 6.4 oz (80 kg)  06/27/24 176 lb 6.4 oz (80 kg)  06/19/24 178 lb (80.7 kg)    Ocular: Sclerae unicteric, pupils equal, round and reactive to light Ear-nose-throat: Oropharynx clear, dentition fair Lymphatic: No cervical or supraclavicular adenopathy Lungs no rales or rhonchi, good excursion bilaterally Heart regular rate and rhythm, no murmur appreciated Abd soft, nontender, positive bowel sounds MSK no focal spinal tenderness, no joint edema Neuro: non-focal, well-oriented, appropriate affect Breasts: Deferred   Lab Results  Component Value Date   WBC 7.1 08/01/2024   HGB 14.2 08/01/2024   HCT 44.0 08/01/2024   MCV 92.1 08/01/2024   PLT 59 (L) 08/01/2024   No results found for: FERRITIN, IRON, TIBC, UIBC, IRONPCTSAT Lab Results  Component Value Date   RBC 4.78 08/01/2024   No results found for: KPAFRELGTCHN, LAMBDASER, KAPLAMBRATIO No results found for: IGGSERUM, IGA, IGMSERUM No results found for: STEPHANY CARLOTA BENSON MARKEL EARLA JOANNIE DOC VICK, SPEI   Chemistry       Component Value Date/Time   NA 140 02/02/2024 1316   NA 143 11/11/2017 1339   K 4.0 02/02/2024 1316   K 3.6 11/11/2017 1339   CL 105 02/02/2024 1316   CL 109 (H) 11/11/2017 1339   CO2 27 02/02/2024 1316   CO2 24 11/11/2017 1339   BUN 15 02/02/2024 1316   BUN 9 11/11/2017 1339   CREATININE 0.99 02/02/2024 1316   CREATININE 1.00 (H) 10/18/2020 1433      Component Value Date/Time   CALCIUM 9.6 02/02/2024 1316   CALCIUM 9.3 11/11/2017 1339   ALKPHOS 89 02/02/2024 1316   ALKPHOS 85 (H) 11/11/2017 1339   AST 16 02/02/2024 1316   ALT 17 02/02/2024 1316   ALT 24 11/11/2017 1339   BILITOT 0.4 02/02/2024 1316       Impression and Plan: [name] is a pleasant  73 yo caucasian female with thrombocytopenia and B12 deficiency.  B 12 injection given earlier this month.  B 12 injection every 6 weeks.  Follow-up in 6 months.    Lauraine Pepper, NP 8/26/20251:51 PM

## 2024-08-16 ENCOUNTER — Other Ambulatory Visit: Payer: Self-pay | Admitting: Family Medicine

## 2024-08-16 DIAGNOSIS — I5189 Other ill-defined heart diseases: Secondary | ICD-10-CM

## 2024-08-21 ENCOUNTER — Ambulatory Visit: Admitting: Family Medicine

## 2024-08-21 ENCOUNTER — Encounter: Payer: Self-pay | Admitting: Neurology

## 2024-08-21 ENCOUNTER — Ambulatory Visit: Payer: Self-pay

## 2024-08-21 NOTE — Telephone Encounter (Signed)
 FYI Only or Action Required?: FYI only for provider. Pt canceled appt today, also declines virtual.  Patient was last seen in primary care on 05/22/2024 by Frann Mabel Mt, DO.  Called Nurse Triage reporting Migraine.  Symptoms began yesterday.  Interventions attempted: OTC medications: Tylenol  and Rest, hydration, or home remedies.  Symptoms are: mild improvement.  Triage Disposition: Information or Advice Only Call  Patient/caregiver understands and will follow disposition?:            Copied from CRM #[id number]. Topic: Clinical - Red Word Triage >> Aug 21, 2024  8:07 AM Willma SAUNDERS wrote: Red Word that prompted transfer to Nurse Triage: Patient states since yesterday she has had head, neck and stomach pain. Has an appointment today but wants to cancel and reschedule. Reason for Disposition  [1] Other NON-URGENT information for PCP AND [2] does not require PCP response  Answer Assessment - Initial Assessment Questions 1. REASON FOR CALL or QUESTION: What is your reason for calling today? or How can I best     Pt calling to cancel appt today due to typical migraine HA that began yesterday 2. CALLER: Document the source of call. (e.g., laboratory staff, caregiver or patient).     Pt called in stating she is experiencing typical migraine HA and would like to cancel today's appt, pt reports she is using medications/home remedies to treat and does not feel able to come in, virtual offered pt also declines. Denies numbness, weakness, changes in speech, dizziness. Routing to clinic for review.  Protocols used: PCP Call - No Triage-A-AH

## 2024-08-21 NOTE — Telephone Encounter (Signed)
 Appt scheduled

## 2024-08-25 ENCOUNTER — Other Ambulatory Visit: Payer: Self-pay | Admitting: Family Medicine

## 2024-08-25 DIAGNOSIS — F419 Anxiety disorder, unspecified: Secondary | ICD-10-CM

## 2024-08-25 NOTE — Telephone Encounter (Signed)
 Copied from CRM [id number]. Topic: Clinical - Medication Refill >> Aug 25, 2024  5:55 PM Drema MATSU wrote: Medication: losartan  (COZAAR ) 50 MG tablet and ALPRAZolam  (XANAX ) 1 MG tablet  Has the patient contacted their pharmacy? Yes (Agent: If no, request that the patient contact the pharmacy for the refill. If patient does not wish to contact the pharmacy document the reason why and proceed with request.) (Agent: If yes, when and what did the pharmacy advise?)  This is the patient's preferred pharmacy:  [facility] - [place], [place] - [address] [place] [address] Phone: [phone] Fax: [phone]  Is this the correct pharmacy for this prescription? Yes If no, delete pharmacy and type the correct one.   Has the prescription been filled recently? No  Is the patient out of the medication? No  Has the patient been seen for an appointment in the last year OR does the patient have an upcoming appointment? Yes  Can we respond through MyChart? Yes  Agent: Please be advised that Rx refills may take up to 3 business days. We ask that you follow-up with your pharmacy.

## 2024-08-28 ENCOUNTER — Ambulatory Visit (INDEPENDENT_AMBULATORY_CARE_PROVIDER_SITE_OTHER): Admitting: Family Medicine

## 2024-08-28 ENCOUNTER — Encounter: Payer: Self-pay | Admitting: Family Medicine

## 2024-08-28 VITALS — BP 134/80 | HR 61 | Temp 98.2°F | Resp 18 | Ht 62.0 in | Wt 176.6 lb

## 2024-08-28 DIAGNOSIS — E782 Mixed hyperlipidemia: Secondary | ICD-10-CM

## 2024-08-28 DIAGNOSIS — E559 Vitamin D deficiency, unspecified: Secondary | ICD-10-CM

## 2024-08-28 DIAGNOSIS — G43119 Migraine with aura, intractable, without status migrainosus: Secondary | ICD-10-CM | POA: Diagnosis not present

## 2024-08-28 DIAGNOSIS — E538 Deficiency of other specified B group vitamins: Secondary | ICD-10-CM | POA: Diagnosis not present

## 2024-08-28 DIAGNOSIS — L817 Pigmented purpuric dermatosis: Secondary | ICD-10-CM | POA: Diagnosis not present

## 2024-08-28 DIAGNOSIS — I10 Essential (primary) hypertension: Secondary | ICD-10-CM | POA: Diagnosis not present

## 2024-08-28 MED ORDER — LOSARTAN POTASSIUM 50 MG PO TABS: 50.0000 mg | ORAL_TABLET | Freq: Every day | ORAL | 0 refills | Status: DC

## 2024-08-28 MED ORDER — ALPRAZOLAM 1 MG PO TABS: ORAL_TABLET | ORAL | 0 refills | Status: DC

## 2024-08-28 NOTE — Telephone Encounter (Signed)
 Requesting: alprazolam  1mg   Contract:05/05/22 UDS: 05/05/22 Last Visit: 08/28/24 Next Visit: None Last Refill: 07/13/24 #30 and 0RF  Please Advise

## 2024-08-28 NOTE — Assessment & Plan Note (Signed)
 Well controlled, no changes to meds. Encouraged heart healthy diet such as the DASH diet and exercise as tolerated.

## 2024-08-28 NOTE — Assessment & Plan Note (Signed)
 Stable == on vyepti  New stress headaches/ muscle tension--- recommend massage/ chiropractor --- pt will hold off

## 2024-08-28 NOTE — Assessment & Plan Note (Signed)
 Encourage heart healthy diet such as MIND or DASH diet, increase exercise, avoid trans fats, simple carbohydrates and processed foods, consider a krill or fish or flaxseed oil cap daily.

## 2024-08-28 NOTE — Progress Notes (Signed)
 Subjective:    Patient ID: Ebony Garcia, female    DOB: 17-Jul-1951, 73 y.o.   MRN: [id number]  Chief Complaint  Patient presents with   Referral    HPI Patient is in today for f/u derm.   Discussed the use of AI scribe software for clinical note transcription with the patient, who gave verbal consent to proceed.  History of Present Illness Ebony Garcia is a 73 year old female with Schamberg's disease who presents for a referral to a vein specialist and evaluation of daily headaches.  She has Schamberg's disease. She reports that her dermatologist told her that her capillaries are leaking and red blood cells are entering her skin, which is causing persistent discoloration. She manages symptoms like itching and pain with a topical corticosteroid cream, though the duration of use is uncertain due to a large supply with refills.  She experiences daily headaches, primarily in the morning, which sometimes resolve with coffee or require her to return to bed. She uses extra strength Tylenol  and ice for relief. These headaches differ from her previous migraines, which have not recurred since starting Vyepti  infusions in April 2023. The headaches are described as annoying, affecting different parts of her head, sometimes unilaterally. Increased stress due to family health issues may contribute to her symptoms. She notes waking up with headaches almost every day, which are not as severe as her previous migraines. No recent changes in caffeine intake, as she consumes a mix of caffeinated and decaffeinated coffee. She also notes neck pain, which she believes may exacerbate her headaches.  Her social history reveals significant stress due to her mother's failing health and the poor health of other family members. She experiences loneliness, living alone for over thirteen years, and has limited contact with her children and grandchildren, adding to her emotional burden.    Past Medical  History:  Diagnosis Date   Acute diastolic CHF (congestive heart failure) (HCC) 04/30/2013   5/24-27/14 acute diastolic heart failure  BNP 1528 2 D echocardiogram essentially negative  Chest x-ray revealed cardiomegaly with costophrenic angle blunting and suggestion of possible interstitial vascular accentuation; no frank heart failure present. 10 pound weight loss with 20 mg of furosemide  daily  No definite etiology of the cardiomegaly or acute diastolic failure    Anxiety 02/07/2015   Auditory hallucination 10/06/2017   B12 deficiency 10/07/2018   Bradycardia 04/30/2013   Cancer (HCC)    Chest pain 05/14/2020   Chest pain with moderate risk of acute coronary syndrome 02/07/2015   CHF (congestive heart failure) (HCC)    dystolic heart dysfunction   Chronic kidney disease    De Quervain's tenosynovitis, left 05/23/2013   Depression    Depression with anxiety    Depressive disorder, not elsewhere classified 05/31/2007   Centricity Description: DISORDER, DEPRESSIVE NEC Qualifier: Diagnosis of  By: Tish MD, Elsie Olam [place], [facility]    Diabetes mellitus without complication [facility])    Diet controlled   Dysmetabolic syndrome X 02/27/2008   Qualifier: Diagnosis of  By: Tish MD, William  A1c 6.3% in 2/12; TG 278    Essential hypertension 08/09/2008   Qualifier: Diagnosis of  By: Tish MD, William     Estrogen deficiency 02/02/2019   Estrogen deficiency 02/02/2019   Fatigue 02/02/2019   Hair loss 02/02/2019   Hyperlipidemia 02/27/2008   Qualifier: Diagnosis of  By: Tish MD, Elsie     Hypertension    Intertrigo  INTERTRIGO 11/08/2009   Qualifier: Diagnosis of  By: Daryl FNP, Melissa S    Intractable migraine with aura without status migrainosus 08/03/2018   Migraine variant 08/15/2008   Qualifier: Diagnosis of  By: Tish MD, Elsie   Onset:initially @ age 25;recurrence age 40 during 1st trimester. Recurrence @ 39. No prodrome or aura  Previously on Tryptans & Topamax  with temporary relief but tolerance appeared Botox by Dr Gerome COTA 09/2010  most effective     Migraines    Myalgia 02/02/2019   NONSPECIFIC ABNORMAL ELECTROCARDIOGRAM 08/15/2008   Qualifier: Diagnosis of  By: Tish MD, William     Palpitation 04/17/2014   Panic attacks    Panic attacks    Polydipsia 11/02/2007   Qualifier: Diagnosis of  By: Tish MD, William     RENAL CALCULUS 02/21/2007   Qualifier: Diagnosis of  By: Nicholaus Channel     Squamous cell carcinoma in situ of skin 12/26/2015   Thrombocytopenia 10/06/2017   Viral syndrome 04/10/2019   WEIGHT GAIN 11/02/2007   Qualifier: Diagnosis of  By: Tish MD, Elsie      Past Surgical History:  Procedure Laterality Date   ABDOMINAL HYSTERECTOMY     BREAST SURGERY     DILATION AND CURETTAGE OF UTERUS     NASAL SEPTUM SURGERY     papaloma Right    TONSILLECTOMY AND ADENOIDECTOMY     TOTAL ABDOMINAL HYSTERECTOMY W/ BILATERAL SALPINGOOPHORECTOMY     for fibroids and migraines   TUBAL LIGATION      Family History  Problem Relation Age of Onset   Crohn's disease Mother    Atrial fibrillation Mother    Heart failure Mother    Bladder Cancer Father    Cancer Father        bladder   Bipolar disorder Brother    Cancer Brother    Bipolar disorder Sister    Heart attack Maternal Grandmother        >65   Cancer Maternal Aunt    Colon cancer Maternal Uncle    Cancer Paternal Grandfather    Cancer Maternal Aunt    Cancer Cousin    Anemia Cousin    Diabetes Neg Hx    Stroke Neg Hx     Social History   Socioeconomic History   Marital status: Divorced    Spouse name: Not on file   Number of children: 3   Years of education: Not on file   Highest education level: 12th grade  Occupational History    Employer: UNEMPLOYED  Tobacco Use   Smoking status: Never    Passive exposure: Past   Smokeless tobacco: Never  Vaping Use   Vaping status: Never Used  Substance and Sexual  Activity   Alcohol use: No    Alcohol/week: 0.0 standard drinks of alcohol   Drug use: No   Sexual activity: Not Currently    Partners: Male  Other Topics Concern   Not on file  Social History Narrative   Lives alone.        Patient is right-handed. She lives on the first floor.      Drinks caffeine   Social Drivers of Corporate investment banker Strain: Medium Risk (08/23/2024)   Overall Financial Resource Strain (CARDIA)    Difficulty of Paying Living Expenses: Somewhat hard  Food Insecurity: No Food Insecurity (08/23/2024)   Hunger Vital Sign    Worried About Running Out of Food in the Last Year: Never true  Ran Out of Food in the Last Year: Never true  Transportation Needs: No Transportation Needs (08/23/2024)   PRAPARE - Administrator, Civil Service (Medical): No    Lack of Transportation (Non-Medical): No  Physical Activity: Inactive (08/23/2024)   Exercise Vital Sign    Days of Exercise per Week: 0 days    Minutes of Exercise per Session: Not on file  Stress: Stress Concern Present (08/23/2024)   [employer] of Occupational Health - Occupational Stress Questionnaire    Feeling of Stress: To some extent  Social Connections: Socially Isolated (08/23/2024)   Social Connection and Isolation Panel    Frequency of Communication with Friends and Family: More than three times a week    Frequency of Social Gatherings with Friends and Family: Three times a week    Attends Religious Services: Patient declined    Active Member of Clubs or Organizations: No    Attends Banker Meetings: Not on file    Marital Status: Divorced  Intimate Partner Violence: Not At Risk (02/01/2024)   Humiliation, Afraid, Rape, and Kick questionnaire    Fear of Current or Ex-Partner: No    Emotionally Abused: No    Physically Abused: No    Sexually Abused: No    Outpatient Medications Prior to Visit  Medication Sig Dispense Refill   ALPRAZolam  (XANAX ) 1 MG tablet  TAKE 1 TABLET BY MOUTH EVERY DAY AT BEDTIME AS NEEDED FOR SLEEP 30 tablet 0   aspirin  EC 81 MG tablet Take 1 tablet (81 mg total) by mouth daily. Swallow whole. 30 tablet 11   Eptinezumab -jjmr (VYEPTI ) 100 MG/ML injection Infusion q12 weeks 1.12 mL 0   FLUoxetine  (PROZAC ) 20 MG capsule Take 1 capsule (20 mg total) by mouth daily. 90 capsule 0   furosemide  (LASIX ) 20 MG tablet Take 1 tablet (20 mg total) by mouth daily. 90 tablet 0   metoCLOPramide  (REGLAN ) 10 MG tablet Take 1 tablet (10 mg total) by mouth every 6 (six) hours as needed for nausea. 20 tablet 5   losartan  (COZAAR ) 50 MG tablet Take 1 tablet (50 mg total) by mouth daily. 30 tablet 2   doxycycline  (VIBRA -TABS) 100 MG tablet Take 1 tablet (100 mg total) by mouth 2 (two) times daily. 20 tablet 0   triamcinolone  cream (KENALOG ) 0.1 % Apply 1 Application topically 2 (two) times daily. 30 g 1   triamcinolone  cream (KENALOG ) 0.1 % Apply 1 Application topically 2 (two) times daily.     No facility-administered medications prior to visit.    Allergies  Allergen Reactions   Amoxicillin-Pot Clavulanate Diarrhea and Other (See Comments)   Antihistamines, Diphenhydramine -Type Other (See Comments)    Other Reaction: makes pt nervous   Diphenhydramine  Hcl Other (See Comments)    Other Reaction: makes pt nervous   Fremanezumab -Vfrm Rash and Other (See Comments)    Seizure, jerking   Clindamycin Other (See Comments)    She has not taken but per patient  her mother had a reaction and she does not want it ever   Hydrocodone -Acetaminophen  Other (See Comments)    REACTION: sensitive to  but can take for extreme pain per patient   Lisinopril      Restless , irregular sleep   Nortriptyline  Other (See Comments)    UNKNOWN REACTION   Other Other (See Comments)   Propoxyphene N-Acetaminophen  Nausea Only   Sulfa Antibiotics Other (See Comments)    Unknown reaction   Tramadol  Other (See Comments)  UNKNOWN REACTION   Tramadol  Hcl Other (See  Comments)    Review of Systems  Constitutional:  Negative for fever and malaise/fatigue.  HENT:  Negative for congestion.   Eyes:  Negative for blurred vision.  Respiratory:  Negative for cough and shortness of breath.   Cardiovascular:  Negative for chest pain, palpitations and leg swelling.  Gastrointestinal:  Negative for vomiting.  Musculoskeletal:  Negative for back pain.  Skin:  Negative for rash.  Neurological:  Positive for headaches. Negative for dizziness and loss of consciousness.  Endo/Heme/Allergies:  Bruises/bleeds easily.       Objective:    Physical Exam Vitals and nursing note reviewed.  Constitutional:      General: She is not in acute distress.    Appearance: Normal appearance. She is well-developed.  HENT:     Head: Normocephalic and atraumatic.  Eyes:     General: No scleral icterus.       Right eye: No discharge.        Left eye: No discharge.  Cardiovascular:     Rate and Rhythm: Normal rate and regular rhythm.     Heart sounds: No murmur heard. Pulmonary:     Effort: Pulmonary effort is normal. No respiratory distress.     Breath sounds: Normal breath sounds.  Musculoskeletal:        General: Normal range of motion.     Cervical back: Normal range of motion and neck supple.     Right lower leg: No edema.     Left lower leg: No edema.  Skin:    General: Skin is warm and dry.     Findings: Bruising present. No erythema.  Neurological:     Mental Status: She is alert and oriented to person, place, and time.  Psychiatric:        Mood and Affect: Mood normal.        Behavior: Behavior normal.        Thought Content: Thought content normal.        Judgment: Judgment normal.     BP 134/80 (BP Location: Right Arm, Patient Position: Sitting, Cuff Size: Normal)   Pulse 61   Temp 98.2 F (36.8 C) (Oral)   Resp 18   Ht 5' 2 (1.575 m)   Wt 176 lb 9.6 oz (80.1 kg)   SpO2 98%   BMI 32.30 kg/m  Wt Readings from Last 3 Encounters:  08/28/24 176  lb 9.6 oz (80.1 kg)  08/01/24 176 lb 6.4 oz (80 kg)  06/27/24 176 lb 6.4 oz (80 kg)    Diabetic Foot Exam - Simple   No data filed    Lab Results  Component Value Date   WBC 7.1 08/01/2024   HGB 14.2 08/01/2024   HCT 44.0 08/01/2024   PLT 59 (L) 08/01/2024   GLUCOSE 185 (H) 08/01/2024   CHOL 196 06/21/2023   TRIG 207.0 (H) 06/21/2023   HDL 51.30 06/21/2023   LDLDIRECT 105.0 06/21/2023   LDLCALC 99 02/04/2023   ALT 16 08/01/2024   AST 20 08/01/2024   NA 138 08/01/2024   K 4.2 08/01/2024   CL 101 08/01/2024   CREATININE 1.00 08/01/2024   BUN 11 08/01/2024   CO2 22 08/01/2024   TSH 3.15 06/21/2023   INR 0.93 07/19/2010   HGBA1C 5.8 06/21/2023    Lab Results  Component Value Date   TSH 3.15 06/21/2023   Lab Results  Component Value Date   WBC 7.1 08/01/2024  HGB 14.2 08/01/2024   HCT 44.0 08/01/2024   MCV 92.1 08/01/2024   PLT 59 (L) 08/01/2024   Lab Results  Component Value Date   NA 138 08/01/2024   K 4.2 08/01/2024   CO2 22 08/01/2024   GLUCOSE 185 (H) 08/01/2024   BUN 11 08/01/2024   CREATININE 1.00 08/01/2024   BILITOT 0.4 08/01/2024   ALKPHOS 103 08/01/2024   AST 20 08/01/2024   ALT 16 08/01/2024   PROT 7.2 08/01/2024   ALBUMIN 4.6 08/01/2024   CALCIUM 9.3 08/01/2024   ANIONGAP 15 08/01/2024   GFR 61.82 06/21/2023   Lab Results  Component Value Date   CHOL 196 06/21/2023   Lab Results  Component Value Date   HDL 51.30 06/21/2023   Lab Results  Component Value Date   LDLCALC 99 02/04/2023   Lab Results  Component Value Date   TRIG 207.0 (H) 06/21/2023   Lab Results  Component Value Date   CHOLHDL 4 06/21/2023   Lab Results  Component Value Date   HGBA1C 5.8 06/21/2023       Assessment & Plan:  Pigmented purpuric dermatosis -     Ambulatory referral to Vascular Surgery  Primary hypertension -     CBC with Differential/Platelet -     Comprehensive metabolic panel with GFR  Vitamin B12 deficiency -     Vitamin  B12  Vitamin D  deficiency -     VITAMIN D  25 Hydroxy (Vit-D Deficiency, Fractures)  Mixed hyperlipidemia Assessment & Plan: Encourage heart healthy diet such as MIND or DASH diet, increase exercise, avoid trans fats, simple carbohydrates and processed foods, consider a krill or fish or flaxseed oil cap daily.    Orders: -     Comprehensive metabolic panel with GFR -     Lipid panel -     TSH  Essential hypertension Assessment & Plan: Well controlled, no changes to meds. Encouraged heart healthy diet such as the DASH diet and exercise as tolerated.     Intractable migraine with aura without status migrainosus Assessment & Plan: Stable == on vyepti  New stress headaches/ muscle tension--- recommend massage/ chiropractor --- pt will hold off     Assessment and Plan Assessment & Plan Pigmented purpuric dermatosis (Schamberg's disease)   This chronic condition involves capillary leakage and red blood cell deposition in the skin, causing purpura. It has persisted for nearly a year with no available cure, and vasculitis has been ruled out. Refer to a vein specialist for imaging to assess blood vessel damage. Continue using topical corticosteroid cream as needed for symptom management, and clarify the duration of use with a dermatologist. Request dermatology records from Dr. Alyce Hoof for continuity of care.  Chronic daily headaches   She experiences daily headaches, primarily in the morning, which do not reach migraine severity. These are likely exacerbated by stress and possibly related to neck tension. She responds variably to caffeine and Tylenol , with no recent changes in caffeine intake. Family stress may be contributing. Advise taking Tylenol  at the first sign of headache to improve efficacy.  General Health Maintenance   She has not had a recent physical examination or laboratory workup for cholesterol and other routine health parameters. Schedule a physical examination and  laboratory workup for these assessments.   Jamoni Hewes R Lowne Chase, DO

## 2024-08-29 LAB — CBC WITH DIFFERENTIAL/PLATELET
Basophils Absolute: 0.1 K/uL (ref 0.0–0.1)
Basophils Relative: 0.9 % (ref 0.0–3.0)
Eosinophils Absolute: 0.2 K/uL (ref 0.0–0.7)
Eosinophils Relative: 2.1 % (ref 0.0–5.0)
HCT: 43.6 % (ref 36.0–46.0)
Hemoglobin: 14.3 g/dL (ref 12.0–15.0)
Lymphocytes Relative: 21.8 % (ref 12.0–46.0)
Lymphs Abs: 1.7 K/uL (ref 0.7–4.0)
MCHC: 32.8 g/dL (ref 30.0–36.0)
MCV: 90.3 fl (ref 78.0–100.0)
Monocytes Absolute: 0.5 K/uL (ref 0.1–1.0)
Monocytes Relative: 7 % (ref 3.0–12.0)
Neutro Abs: 5.2 K/uL (ref 1.4–7.7)
Neutrophils Relative %: 68.2 % (ref 43.0–77.0)
Platelets: 73 K/uL — ABNORMAL LOW (ref 150.0–400.0)
RBC: 4.83 Mil/uL (ref 3.87–5.11)
RDW: 13.2 % (ref 11.5–15.5)
WBC: 7.6 K/uL (ref 4.0–10.5)

## 2024-08-29 LAB — COMPREHENSIVE METABOLIC PANEL WITH GFR
ALT: 16 U/L (ref 0–35)
AST: 18 U/L (ref 0–37)
Albumin: 4.6 g/dL (ref 3.5–5.2)
Alkaline Phosphatase: 96 U/L (ref 39–117)
BUN: 13 mg/dL (ref 6–23)
CO2: 29 meq/L (ref 19–32)
Calcium: 9.4 mg/dL (ref 8.4–10.5)
Chloride: 102 meq/L (ref 96–112)
Creatinine, Ser: 0.93 mg/dL (ref 0.40–1.20)
GFR: 62.09 mL/min (ref >60.00)
Glucose, Bld: 91 mg/dL (ref 70–99)
Potassium: 4.2 meq/L (ref 3.5–5.1)
Sodium: 141 meq/L (ref 135–145)
Total Bilirubin: 0.4 mg/dL (ref 0.2–1.2)
Total Protein: 7 g/dL (ref 6.0–8.3)

## 2024-08-29 LAB — LIPID PANEL
Cholesterol: 210 mg/dL — ABNORMAL HIGH (ref 0–200)
HDL: 53.9 mg/dL (ref >39.00)
LDL Cholesterol: 123 mg/dL — ABNORMAL HIGH (ref 0–99)
NonHDL: 156.09
Total CHOL/HDL Ratio: 4
Triglycerides: 163 mg/dL — ABNORMAL HIGH (ref 0.0–149.0)
VLDL: 32.6 mg/dL (ref 0.0–40.0)

## 2024-08-29 LAB — TSH: TSH: 3.37 u[IU]/mL (ref 0.35–5.50)

## 2024-08-29 LAB — VITAMIN D 25 HYDROXY (VIT D DEFICIENCY, FRACTURES): VITD: 24.17 ng/mL — ABNORMAL LOW (ref 30.00–100.00)

## 2024-08-29 LAB — VITAMIN B12: Vitamin B-12: 477 pg/mL (ref 211–911)

## 2024-08-30 ENCOUNTER — Telehealth: Payer: Self-pay

## 2024-08-30 ENCOUNTER — Other Ambulatory Visit: Payer: Self-pay | Admitting: Family Medicine

## 2024-08-30 DIAGNOSIS — F419 Anxiety disorder, unspecified: Secondary | ICD-10-CM

## 2024-08-30 NOTE — Telephone Encounter (Signed)
 Can you send a 5 day supply of alprazolam  to her local [facility]? She is waiting for mail order supply.

## 2024-08-30 NOTE — Telephone Encounter (Signed)
 Copied from CRM #[id number]. Topic: Clinical - Prescription Issue >> Aug 30, 2024  1:33 PM Alfonso HERO wrote: Reason for CRM: ALPRAZolam  (XANAX ) 1 MG tablet Patient has 1/4th a tablet left of the medication. She has been trying to get the Rx but the pharmacy said it'll be several days for her to receive through mail and for the patient to call an see if a 5 day supply can be sent to the local pharmacy. [facility] [phone]

## 2024-08-30 NOTE — Telephone Encounter (Signed)
 Pharmacy Patient Advocate Encounter  Received notification from [employer] that Prior Authorization for ALPRAZOLAM  1MG  TABS has been APPROVED from 08/30/24 to 09/04/24   PA #/Case ID/Reference #: -

## 2024-08-31 ENCOUNTER — Other Ambulatory Visit: Payer: Self-pay | Admitting: Family Medicine

## 2024-08-31 ENCOUNTER — Ambulatory Visit: Payer: Self-pay

## 2024-08-31 ENCOUNTER — Telehealth: Payer: Self-pay | Admitting: Family Medicine

## 2024-08-31 DIAGNOSIS — F419 Anxiety disorder, unspecified: Secondary | ICD-10-CM

## 2024-08-31 MED ORDER — ALPRAZOLAM 1 MG PO TABS: ORAL_TABLET | ORAL | 0 refills | Status: DC

## 2024-08-31 NOTE — Telephone Encounter (Signed)
Prescription was sent today

## 2024-08-31 NOTE — Telephone Encounter (Signed)
  Pt calling to say that pharmacy hasn't gotten refill request for Alprazolam  that was reordered today. Also questioning why only 5 tablets. RN called CAL and was told to send CRM. Please advise       Copied from CRM [id number]. Topic: Clinical - Red Word Triage >> Aug 31, 2024 11:18 AM Ebony Garcia wrote: [facility] that prompted transfer to Nurse Triage: Patient states she has been calling over and over for her  ALPRAZolam  (XANAX ) 1 MG tablet She has been having trouble getting it. And is having anxiety issues

## 2024-08-31 NOTE — Telephone Encounter (Signed)
 Copied from CRM #[id number]. Topic: Clinical - Medication Question >> Aug 31, 2024  3:48 PM Franky GRADE wrote: Reason for CRM: [place] from [facility] pharmacy is calling regarding ALPRAZolam  (XANAX ) 1 MG tablet [[id number]], patient got a refill from a mail order pharmacy and wanted to verify it was okay to fill the prescription early at their location. Patient is at the pharmacy.

## 2024-09-03 ENCOUNTER — Other Ambulatory Visit: Payer: Self-pay | Admitting: Family Medicine

## 2024-09-03 ENCOUNTER — Ambulatory Visit: Payer: Self-pay | Admitting: Family Medicine

## 2024-09-03 ENCOUNTER — Encounter: Payer: Self-pay | Admitting: Family Medicine

## 2024-09-03 DIAGNOSIS — I10 Essential (primary) hypertension: Secondary | ICD-10-CM

## 2024-09-03 DIAGNOSIS — E118 Type 2 diabetes mellitus with unspecified complications: Secondary | ICD-10-CM

## 2024-09-03 DIAGNOSIS — E782 Mixed hyperlipidemia: Secondary | ICD-10-CM

## 2024-09-12 ENCOUNTER — Inpatient Hospital Stay: Attending: Family

## 2024-09-12 VITALS — BP 139/84 | HR 78 | Temp 97.6°F | Resp 18

## 2024-09-12 DIAGNOSIS — D6959 Other secondary thrombocytopenia: Secondary | ICD-10-CM | POA: Insufficient documentation

## 2024-09-12 DIAGNOSIS — E538 Deficiency of other specified B group vitamins: Secondary | ICD-10-CM | POA: Insufficient documentation

## 2024-09-12 MED ORDER — CYANOCOBALAMIN 1000 MCG/ML IJ SOLN
1000.0000 ug | Freq: Once | INTRAMUSCULAR | Status: AC
  Administered 2024-09-12: 1000 ug via INTRAMUSCULAR
  Filled 2024-09-12: qty 1

## 2024-09-12 NOTE — Patient Instructions (Signed)
 Vitamin B12 Deficiency Vitamin B12 deficiency occurs when the body does not have enough of this important vitamin. The body needs this vitamin: To make red blood cells. To make DNA. This is the genetic material inside cells. To help the nerves work properly so they can carry messages from the brain to the body. Vitamin B12 deficiency can cause health problems, such as not having enough red blood cells in the blood (anemia). This can lead to nerve damage if untreated. What are the causes? This condition may be caused by: Not eating enough foods that contain vitamin B12. Not having enough stomach acid and digestive fluids to properly absorb vitamin B12 from the food that you eat. Having certain diseases that make it hard to absorb vitamin B12. These diseases include Crohn's disease, chronic pancreatitis, and cystic fibrosis. An autoimmune disorder in which the body does not make enough of a protein (intrinsic factor) within the stomach, resulting in not enough absorption of vitamin B12. Having a surgery in which part of the stomach or small intestine is removed. Taking certain medicines that make it hard for the body to absorb vitamin B12. These include: Heartburn medicines, such as antacids and proton pump inhibitors. Some medicines that are used to treat diabetes. What increases the risk? The following factors may make you more likely to develop a vitamin B12 deficiency: Being an older adult. Eating a vegetarian or vegan diet that does not include any foods that come from animals. Eating a poor diet while you are pregnant. Taking certain medicines. Having alcoholism. What are the signs or symptoms? In some cases, there are no symptoms of this condition. If the condition leads to anemia or nerve damage, various symptoms may occur, such as: Weakness. Tiredness (fatigue). Loss of appetite. Numbness or tingling in your hands and feet. Redness and burning of the tongue. Depression,  confusion, or memory problems. Trouble walking. If anemia is severe, symptoms can include: Shortness of breath. Dizziness. Rapid heart rate. How is this diagnosed? This condition may be diagnosed with a blood test to measure the level of vitamin B12 in your blood. You may also have other tests, including: A group of tests that measure certain characteristics of blood cells (complete blood count, CBC). A blood test to measure intrinsic factor. A procedure where a thin tube with a camera on the end is used to look into your stomach or intestines (endoscopy). Other tests may be needed to discover the cause of the deficiency. How is this treated? Treatment for this condition depends on the cause. This condition may be treated by: Changing your eating and drinking habits, such as: Eating more foods that contain vitamin B12. Drinking less alcohol or no alcohol. Getting vitamin B12 injections. Taking vitamin B12 supplements by mouth (orally). Your health care provider will tell you which dose is best for you. Follow these instructions at home: Eating and drinking  Include foods in your diet that come from animals and contain a lot of vitamin B12. These include: Meats and poultry. This includes beef, pork, chicken, [place], and organ meats, such as liver. Seafood. This includes clams, rainbow trout, salmon, tuna, and haddock. Eggs. Dairy foods such as milk, yogurt, and cheese. Eat foods that have vitamin B12 added to them (are fortified), such as ready-to-eat breakfast cereals. Check the label on the package to see if a food is fortified. The items listed above may not be a complete list of foods and beverages you can eat and drink. Contact a dietitian for  more information. Alcohol use Do not drink alcohol if: Your health care provider tells you not to drink. You are pregnant, may be pregnant, or are planning to become pregnant. If you drink alcohol: Limit how much you have to: 0-1 drink a  day for women. 0-2 drinks a day for men. Know how much alcohol is in your drink. In the [place] one drink equals one 12 oz bottle of beer (355 mL), one 5 oz glass of wine (148 mL), or one 1 oz glass of hard liquor (44 mL). General instructions Get vitamin B12 injections if told to by your health care provider. Take supplements only as told by your health care provider. Follow the directions carefully. Keep all follow-up visits. This is important. Contact a health care provider if: Your symptoms come back. Your symptoms get worse or do not improve with treatment. Get help right away: You develop shortness of breath. You have a rapid heart rate. You have chest pain. You become dizzy or you faint. These symptoms may be an emergency. Get help right away. Call 911. Do not wait to see if the symptoms will go away. Do not drive yourself to the hospital. Summary Vitamin B12 deficiency occurs when the body does not have enough of this important vitamin. Common causes include not eating enough foods that contain vitamin B12, not being able to absorb vitamin B12 from the food that you eat, having a surgery in which part of the stomach or small intestine is removed, or taking certain medicines. Eat foods that have vitamin B12 in them. Treatment may include making a change in the way you eat and drink, getting vitamin B12 injections, or taking vitamin B12 supplements. This information is not intended to replace advice given to you by your health care provider. Make sure you discuss any questions you have with your health care provider. Document Revised: 07/18/2021 Document Reviewed: 07/18/2021 Elsevier Patient Education  2024 [employer].

## 2024-09-19 ENCOUNTER — Ambulatory Visit

## 2024-09-19 VITALS — BP 130/81 | HR 56 | Temp 98.4°F | Resp 14 | Ht 62.0 in | Wt 177.6 lb

## 2024-09-19 DIAGNOSIS — G43119 Migraine with aura, intractable, without status migrainosus: Secondary | ICD-10-CM

## 2024-09-19 MED ORDER — SODIUM CHLORIDE 0.9 % IV SOLN
100.0000 mg | Freq: Once | INTRAVENOUS | Status: AC
  Administered 2024-09-19: 100 mg via INTRAVENOUS
  Filled 2024-09-19: qty 1

## 2024-09-19 NOTE — Progress Notes (Signed)
 Diagnosis: Migraines  Provider:  Praveen Mannam MD  Procedure: IV Infusion  IV Type: Peripheral, IV Location: R Antecubital  Vyepti  (Eptinezumab -jjmr), Dose: 100 mg  Infusion Start Time: 1506  Infusion Stop Time: 1538  Post Infusion IV Care: Peripheral IV Discontinued  Discharge: Condition: Good, Destination: Home . AVS Declined  Performed by:  Maximiano JONELLE Pouch, LPN

## 2024-09-21 ENCOUNTER — Other Ambulatory Visit: Payer: Self-pay | Admitting: Family Medicine

## 2024-10-16 ENCOUNTER — Other Ambulatory Visit: Payer: Self-pay | Admitting: Family Medicine

## 2024-10-16 DIAGNOSIS — F419 Anxiety disorder, unspecified: Secondary | ICD-10-CM

## 2024-10-16 NOTE — Telephone Encounter (Signed)
 Requesting: Xanax  Contract: 2023 UDS: 2023 Last OV: 08/28/2024 Next OV: 02/26/2025 Last Refill: 08/31/2024, #5--0 RF Database:   Please advise

## 2024-10-23 NOTE — Progress Notes (Unsigned)
 NEUROLOGY FOLLOW UP OFFICE NOTE  Ebony Garcia [id number]  Assessment/Plan:   Migraine without aura, without status migrainosus, not intractable         Migraine prevention:  Vyepti  100mg  every 3 months Limit use of pain relievers to no more than 9 days out of month to prevent risk of rebound or medication-overuse headache. Keep headache diary Follow up 7 months     Subjective:  Ebony Garcia is a 73 year old right-handed woman with hypertension, CHF, B12 deficiency, depression and hyperlipidemia who follows up for migraines.   UPDATE Discontinued topiramate  at last visit but continues on Vyepti . Still no migraines since April 2023.  Sometimes will wake up with a morning headache that resolves with coffee.     Over the past year, she has been unsteady on her feet.  She reports burning and tingling in the feet.  B12 in February was 400.  NCV-EMG on 05/15/2024 was normal.  Developed a rash.  Seen by her PCP who suspected vasculitis.  She had a punch skin biopsy and was subsequently diagnosed with Schamberg'Garcia disease.  She now wears compression stockings on her legs.    Current NSAIDS:  ASA 81mg  Current analgesics:  no. Current triptans:   none Current anti-emetic:  no Current muscle relaxants:  no Current anti-anxiolytic:  Xanax  1mg  Current sleep aide:  Xanax  1mg  Current Antihypertensive medications:  Lasix  Current Antidepressant medications:  Prozac  20mg  Current Anticonvulsant medications:  topiramate  25mg  BID Current anti-CGRP:  Vyepti  100mg     Depression and anxiety:  stable She reports off and on body aches, left hip pain Reports drinking 64 oz water daily    HISTORY: Onset:  Since her late 30s. Location: usually left sided, periorbital, and back of head.  More recently, holocephalic Quality:  Sharp, aching pressure.  Non-throbbing Initial intensity:  7/10; Sept: 7/10 Associated symptoms:  Nausea, photophobia and phonophobia.  No visual disturbance,  osmophobia, vomiting or unilateral numbness or weakness Aura:  no Initial duration:  3 days; Sept: 30 minutes with Maxalt  Initial frequency:  25 headache days per month Triggers:  Emotional stress Relieving factors:  Cold mask, heating pack on neck and shoulders   Past NSAIDs:  naproxen (ineffective), indomethacin , toradol  tablet (ineffective), Toradol  60mg  IM (effective), Cambia (side effects) Past analgesics:  Tylenol  (ineffective), Excedrin (ineffective), Lidocaine nasal drops (effective but caused hives), tramadol  (side effects), Midrin (effective but made her sleepy) Past triptans/ergots:  sumatriptan  100mg , Zomig  5mg  (effective), Frova, Relpax, DHE (effective), Maxalt  (effective but unable to afford it), Tosymra  NS, Zomig  5mg  NS Past antiemetic:  Promethazine , Zofran , Reglan  Past anxiolytics:  Vicodin Past muscle relaxants:  cyclobenzaprine  Past antihypertensives:  propranolol  (ineffective), atenolol  (ineffective), lisinopril , metoprolol , HCTZ, losartan  Past antidepressants:  amitriptyline (side effects), venlafaxine (ineffective), nortriptyline  (effective but side effects such as rash), imipramine Past antiepileptics:  Depakote (hair loss), lamotrigine  (side effects), zonisamide  (side effects), gabapentin , Keppra Past CGRP inhibitor:  Aimovig (effective but concerned about side effects- ankle jerking, abnormal skin pattern), Ajovy  (same side effects); Emgality  (cost) Past vitamins and supplements:  magnesium (ineffective), feverfew (ineffective) Other past therapy:  Botox (one round, flu like symptoms, unable to afford 20% copay), occipital nerve blocks, trigger point injections, alternating heat/ice   Family history of headache:  Mother, grandmother, great-aunt, cousins.   08/16/08 MRI Brain w/wo performed for "explosive headaches": nonspecific punctate hyperintensities in the subcortical white matter. 08/16/08 MRA Head: Unremarkable.  There is mild attenuation in the vertebrobasilar  junction and proximal basilar artery which is likely artifact. 07/05/19 CTA  of Head:  Unremarkable for mass lesion, high-grade arterial stenosis, aneurysm or other acute intracranial abnormality.  PAST MEDICAL HISTORY: Past Medical History:  Diagnosis Date   Acute diastolic CHF (congestive heart failure) (HCC) 04/30/2013   5/24-27/14 acute diastolic heart failure  BNP 1528 2 D echocardiogram essentially negative  Chest x-ray revealed cardiomegaly with costophrenic angle blunting and suggestion of possible interstitial vascular accentuation; no frank heart failure present. 10 pound weight loss with 20 mg of furosemide  daily  No definite etiology of the cardiomegaly or acute diastolic failure    Anxiety 02/07/2015   Auditory hallucination 10/06/2017   B12 deficiency 10/07/2018   Bradycardia 04/30/2013   Cancer (HCC)    Chest pain 05/14/2020   Chest pain with moderate risk of acute coronary syndrome 02/07/2015   CHF (congestive heart failure) (HCC)    dystolic heart dysfunction   Chronic kidney disease    De Quervain'Garcia tenosynovitis, left 05/23/2013   Depression    Depression with anxiety    Depressive disorder, not elsewhere classified 05/31/2007   Centricity Description: DISORDER, DEPRESSIVE NEC Qualifier: Diagnosis of  By: Tish MD, Ebony Garcia, [facility]    Diabetes mellitus without complication [facility])    Diet controlled   Dysmetabolic syndrome X 02/27/2008   Qualifier: Diagnosis of  By: Tish MD, Ebony Garcia  A1c 6.3% in 2/12; TG 278    Essential hypertension 08/09/2008   Qualifier: Diagnosis of  By: Tish MD, Ebony Garcia     Estrogen deficiency 02/02/2019   Estrogen deficiency 02/02/2019   Fatigue 02/02/2019   Hair loss 02/02/2019   Hyperlipidemia 02/27/2008   Qualifier: Diagnosis of  By: Tish MD, Ebony Garcia     Hypertension    Intertrigo    INTERTRIGO 11/08/2009   Qualifier: Diagnosis of  By: Daryl FNP, Ebony Garcia    Intractable migraine  with aura without status migrainosus 08/03/2018   Migraine variant 08/15/2008   Qualifier: Diagnosis of  By: Tish MD, Ebony Garcia   Onset:initially @ age 58;recurrence age 78 during 1st trimester. Recurrence @ 39. No prodrome or aura Previously on Tryptans & Topamax  with temporary relief but tolerance appeared Botox by Dr Gerome COTA 09/2010  most effective     Migraines    Myalgia 02/02/2019   NONSPECIFIC ABNORMAL ELECTROCARDIOGRAM 08/15/2008   Qualifier: Diagnosis of  By: Tish MD, Ebony Garcia     Palpitation 04/17/2014   Panic attacks    Panic attacks    Polydipsia 11/02/2007   Qualifier: Diagnosis of  By: Tish MD, Ebony Garcia     RENAL CALCULUS 02/21/2007   Qualifier: Diagnosis of  By: Nicholaus Channel     Squamous cell carcinoma in situ of skin 12/26/2015   Thrombocytopenia 10/06/2017   Viral syndrome 04/10/2019   WEIGHT GAIN 11/02/2007   Qualifier: Diagnosis of  By: Tish MD, Ebony      MEDICATIONS: Current Outpatient Medications on File Prior to Visit  Medication Sig Dispense Refill   ALPRAZolam  (XANAX ) 1 MG tablet TAKE 1 TABLET BY MOUTH EVERY DAY AT BEDTIME AS NEEDED FOR SLEEP 30 tablet 0   aspirin  EC 81 MG tablet Take 1 tablet (81 mg total) by mouth daily. Swallow whole. 30 tablet 11   Eptinezumab -jjmr (VYEPTI ) 100 MG/ML injection Infusion q12 weeks 1.12 mL 0   FLUoxetine  (PROZAC ) 20 MG capsule Take 1 capsule (20 mg total) by mouth daily. 90 capsule 0   furosemide  (LASIX ) 20 MG tablet Take 1 tablet (20 mg total) by mouth daily. 90 tablet 0  losartan  (COZAAR ) 50 MG tablet Take 1 tablet (50 mg total) by mouth daily. 90 tablet 1   metoCLOPramide  (REGLAN ) 10 MG tablet Take 1 tablet (10 mg total) by mouth every 6 (six) hours as needed for nausea. 20 tablet 5   No current facility-administered medications on file prior to visit.     ALLERGIES: Allergies  Allergen Reactions   Amoxicillin-Pot Clavulanate Diarrhea and Other (See Comments)   Antihistamines, Diphenhydramine -Type  Other (See Comments)    Other Reaction: makes pt nervous   Diphenhydramine  Hcl Other (See Comments)    Other Reaction: makes pt nervous   Fremanezumab -Vfrm Rash and Other (See Comments)    Seizure, jerking   Clindamycin Other (See Comments)    She has not taken but per patient  her mother had a reaction and she does not want it ever   Hydrocodone -Acetaminophen  Other (See Comments)    REACTION: sensitive to  but can take for extreme pain per patient   Lisinopril      Restless , irregular sleep   Nortriptyline  Other (See Comments)    UNKNOWN REACTION   Other Other (See Comments)   Propoxyphene N-Acetaminophen  Nausea Only   Sulfa Antibiotics Other (See Comments)    Unknown reaction   Tramadol  Other (See Comments)    UNKNOWN REACTION   Tramadol  Hcl Other (See Comments)    FAMILY HISTORY: Family History  Problem Relation Age of Onset   Crohn'Garcia disease Mother    Atrial fibrillation Mother    Heart failure Mother    Bladder Cancer Father    Cancer Father        bladder   Bipolar disorder Brother    Cancer Brother    Bipolar disorder Sister    Heart attack Maternal Grandmother        >65   Cancer Maternal Aunt    Colon cancer Maternal Uncle    Cancer Paternal Grandfather    Cancer Maternal Aunt    Cancer Cousin    Anemia Cousin    Diabetes Neg Hx    Stroke Neg Hx       Objective:  Blood pressure 107/73, pulse 93, height 5' 2 (1.575 m), weight 178 lb 9.6 oz (81 kg), SpO2 96%. General: No acute distress.  Patient appears well-groomed.      Juliene Dunnings, DO  CC: Jamee Antonio Meth, DO

## 2024-10-24 ENCOUNTER — Ambulatory Visit: Admitting: Neurology

## 2024-10-24 ENCOUNTER — Encounter: Payer: Self-pay | Admitting: Neurology

## 2024-10-24 VITALS — BP 107/73 | HR 93 | Ht 62.0 in | Wt 178.6 lb

## 2024-10-24 DIAGNOSIS — G43009 Migraine without aura, not intractable, without status migrainosus: Secondary | ICD-10-CM

## 2024-10-24 NOTE — Patient Instructions (Signed)
 Vyepti  100mg  every 3 months

## 2024-10-25 ENCOUNTER — Inpatient Hospital Stay

## 2024-10-29 ENCOUNTER — Other Ambulatory Visit: Payer: Self-pay | Admitting: Family Medicine

## 2024-10-29 DIAGNOSIS — I5189 Other ill-defined heart diseases: Secondary | ICD-10-CM

## 2024-10-31 ENCOUNTER — Inpatient Hospital Stay: Attending: Family

## 2024-10-31 VITALS — BP 133/86 | HR 81 | Temp 98.0°F | Resp 18

## 2024-10-31 DIAGNOSIS — E538 Deficiency of other specified B group vitamins: Secondary | ICD-10-CM | POA: Insufficient documentation

## 2024-10-31 DIAGNOSIS — D6959 Other secondary thrombocytopenia: Secondary | ICD-10-CM | POA: Insufficient documentation

## 2024-10-31 MED ORDER — CYANOCOBALAMIN 1000 MCG/ML IJ SOLN
1000.0000 ug | Freq: Once | INTRAMUSCULAR | Status: AC
  Administered 2024-10-31: 1000 ug via INTRAMUSCULAR
  Filled 2024-10-31: qty 1

## 2024-10-31 NOTE — Patient Instructions (Signed)
 Vitamin B12 Injection What is this medication? Vitamin B12 (VAHY tuh min B12) prevents and treats low vitamin B12 levels in your body. It is used in people who do not get enough vitamin B12 from their diet or when their digestive tract does not absorb enough. Vitamin B12 plays an important role in maintaining the health of your nervous system and red blood cells. This medicine may be used for other purposes; ask your health care provider or pharmacist if you have questions. COMMON BRAND NAME(S): B-12 Compliance Kit, B-12 Injection Kit, Cyomin, Dodex , LA-12, Nutri-Twelve, Physicians EZ Use B-12, Primabalt, Vitamin Deficiency Injectable System - B12 What should I tell my care team before I take this medication? They need to know if you have any of these conditions: Kidney disease Leber's disease Megaloblastic anemia An unusual or allergic reaction to cyanocobalamin , cobalt, other medications, foods, dyes, or preservatives Pregnant or trying to get pregnant Breast-feeding How should I use this medication? This medication is injected into a muscle or deeply under the skin. It is usually given in a clinic or care team's office. However, your care team may teach you how to inject yourself. Follow all instructions. Talk to your care team about the use of this medication in children. Special care may be needed. Overdosage: If you think you have taken too much of this medicine contact a poison control center or emergency room at once. NOTE: This medicine is only for you. Do not share this medicine with others. What if I miss a dose? If you are given your dose at a clinic or care team's office, call to reschedule your appointment. If you give your own injections, and you miss a dose, take it as soon as you can. If it is almost time for your next dose, take only that dose. Do not take double or extra doses. What may interact with this medication? Alcohol Colchicine This list may not describe all possible  interactions. Give your health care provider a list of all the medicines, herbs, non-prescription drugs, or dietary supplements you use. Also tell them if you smoke, drink alcohol, or use illegal drugs. Some items may interact with your medicine. What should I watch for while using this medication? Visit your care team regularly. You may need blood work done while you are taking this medication. You may need to follow a special diet. Talk to your care team. Limit your alcohol intake and avoid smoking to get the best benefit. What side effects may I notice from receiving this medication? Side effects that you should report to your care team as soon as possible: Allergic reactions--skin rash, itching, hives, swelling of the face, lips, tongue, or throat Swelling of the ankles, hands, or feet Trouble breathing Side effects that usually do not require medical attention (report to your care team if they continue or are bothersome): Diarrhea This list may not describe all possible side effects. Call your doctor for medical advice about side effects. You may report side effects to FDA at 1-800-FDA-1088. Where should I keep my medication? Keep out of the reach of children. Store at room temperature between 15 and 30 degrees C (59 and 85 degrees F). Protect from light. Throw away any unused medication after the expiration date. NOTE: This sheet is a summary. It may not cover all possible information. If you have questions about this medicine, talk to your doctor, pharmacist, or health care provider.  2024 Elsevier/Gold Standard (2021-08-05 00:00:00)

## 2024-11-16 ENCOUNTER — Other Ambulatory Visit: Payer: Self-pay | Admitting: Family Medicine

## 2024-11-16 DIAGNOSIS — F419 Anxiety disorder, unspecified: Secondary | ICD-10-CM

## 2024-11-17 NOTE — Telephone Encounter (Signed)
 Requesting: alprazolam  1mg   Contract: None UDS: None Last Visit: 08/28/24 Next Visit: 02/26/25 Last Refill: 10/16/24 #30 and 0RF  Please Advise

## 2024-11-24 ENCOUNTER — Other Ambulatory Visit: Payer: Self-pay | Admitting: *Deleted

## 2024-11-24 DIAGNOSIS — L819 Disorder of pigmentation, unspecified: Secondary | ICD-10-CM

## 2024-12-06 ENCOUNTER — Inpatient Hospital Stay

## 2024-12-08 ENCOUNTER — Telehealth: Payer: Self-pay

## 2024-12-08 NOTE — Telephone Encounter (Signed)
 Auth Submission: APPROVED Site of care: Site of care: [facility] Payer: [employer] medicare Medication & CPT/J Code(s) submitted: Vyepti  (Eptinezumab ) [id number] Diagnosis Code:  Route of submission (phone, fax, portal): portal [employer] Phone # Fax # Auth type: Buy/Bill PB Units/visits requested: 100mg  x 4 doses Reference number: [id number] Approval from: 12/07/24 to 12/06/25  Approval letter is in the media tab.

## 2024-12-11 ENCOUNTER — Inpatient Hospital Stay: Attending: Family

## 2024-12-11 DIAGNOSIS — E538 Deficiency of other specified B group vitamins: Secondary | ICD-10-CM | POA: Insufficient documentation

## 2024-12-11 DIAGNOSIS — D6959 Other secondary thrombocytopenia: Secondary | ICD-10-CM | POA: Diagnosis present

## 2024-12-11 MED ORDER — CYANOCOBALAMIN 1000 MCG/ML IJ SOLN
1000.0000 ug | Freq: Once | INTRAMUSCULAR | Status: AC
  Administered 2024-12-11: 1000 ug via INTRAMUSCULAR
  Filled 2024-12-11: qty 1

## 2024-12-20 ENCOUNTER — Ambulatory Visit

## 2024-12-21 ENCOUNTER — Ambulatory Visit

## 2024-12-21 ENCOUNTER — Ambulatory Visit (HOSPITAL_COMMUNITY)

## 2024-12-22 ENCOUNTER — Ambulatory Visit

## 2024-12-26 ENCOUNTER — Other Ambulatory Visit: Payer: Self-pay | Admitting: Family Medicine

## 2024-12-26 DIAGNOSIS — F419 Anxiety disorder, unspecified: Secondary | ICD-10-CM

## 2024-12-27 NOTE — Telephone Encounter (Signed)
 Requesting: Xanax  Contract: n/a UDS: n/a Last OV: 08/28/2024 Next OV: 02/26/2025 Last Refill: 11/17/2024, #30--0 RF Database:   Please advise

## 2025-01-03 ENCOUNTER — Ambulatory Visit

## 2025-01-04 ENCOUNTER — Encounter: Payer: Self-pay | Admitting: Family Medicine

## 2025-01-09 ENCOUNTER — Ambulatory Visit: Admitting: Vascular Surgery

## 2025-01-09 ENCOUNTER — Ambulatory Visit (HOSPITAL_COMMUNITY)

## 2025-01-17 ENCOUNTER — Inpatient Hospital Stay: Attending: Family

## 2025-01-17 ENCOUNTER — Inpatient Hospital Stay

## 2025-01-17 ENCOUNTER — Inpatient Hospital Stay: Admitting: Family

## 2025-02-06 ENCOUNTER — Ambulatory Visit: Payer: Medicare HMO

## 2025-02-26 ENCOUNTER — Ambulatory Visit: Admitting: Family Medicine

## 2025-05-15 ENCOUNTER — Ambulatory Visit (HOSPITAL_COMMUNITY)

## 2025-05-15 ENCOUNTER — Encounter: Admitting: Vascular Surgery

## 2025-05-28 ENCOUNTER — Ambulatory Visit: Admitting: Neurology
